# Patient Record
Sex: Female | Born: 1940 | Race: White | Hispanic: No | Marital: Married | Smoking: Former smoker
Health system: Southern US, Community
[De-identification: ages and names within clinical notes are randomized; demographics above are authoritative.]

## PROBLEM LIST (undated history)

## (undated) DIAGNOSIS — D649 Anemia, unspecified: Secondary | ICD-10-CM

## (undated) DIAGNOSIS — K219 Gastro-esophageal reflux disease without esophagitis: Secondary | ICD-10-CM

## (undated) DIAGNOSIS — R7989 Other specified abnormal findings of blood chemistry: Secondary | ICD-10-CM

## (undated) DIAGNOSIS — K5790 Diverticulosis of intestine, part unspecified, without perforation or abscess without bleeding: Secondary | ICD-10-CM

## (undated) DIAGNOSIS — R0600 Dyspnea, unspecified: Secondary | ICD-10-CM

## (undated) DIAGNOSIS — L309 Dermatitis, unspecified: Secondary | ICD-10-CM

## (undated) DIAGNOSIS — R918 Other nonspecific abnormal finding of lung field: Secondary | ICD-10-CM

## (undated) DIAGNOSIS — E781 Pure hyperglyceridemia: Secondary | ICD-10-CM

## (undated) DIAGNOSIS — C569 Malignant neoplasm of unspecified ovary: Secondary | ICD-10-CM

## (undated) DIAGNOSIS — H269 Unspecified cataract: Secondary | ICD-10-CM

## (undated) DIAGNOSIS — R0609 Other forms of dyspnea: Secondary | ICD-10-CM

## (undated) DIAGNOSIS — H9190 Unspecified hearing loss, unspecified ear: Secondary | ICD-10-CM

## (undated) DIAGNOSIS — M8580 Other specified disorders of bone density and structure, unspecified site: Secondary | ICD-10-CM

## (undated) DIAGNOSIS — C50919 Malignant neoplasm of unspecified site of unspecified female breast: Secondary | ICD-10-CM

## (undated) DIAGNOSIS — J439 Emphysema, unspecified: Secondary | ICD-10-CM

## (undated) DIAGNOSIS — L719 Rosacea, unspecified: Secondary | ICD-10-CM

## (undated) DIAGNOSIS — E041 Nontoxic single thyroid nodule: Secondary | ICD-10-CM

## (undated) DIAGNOSIS — F32A Depression, unspecified: Secondary | ICD-10-CM

## (undated) DIAGNOSIS — B029 Zoster without complications: Secondary | ICD-10-CM

## (undated) DIAGNOSIS — G43909 Migraine, unspecified, not intractable, without status migrainosus: Secondary | ICD-10-CM

## (undated) DIAGNOSIS — C801 Malignant (primary) neoplasm, unspecified: Secondary | ICD-10-CM

## (undated) DIAGNOSIS — T8859XA Other complications of anesthesia, initial encounter: Secondary | ICD-10-CM

## (undated) DIAGNOSIS — S12000A Unspecified displaced fracture of first cervical vertebra, initial encounter for closed fracture: Secondary | ICD-10-CM

## (undated) DIAGNOSIS — Z800 Family history of malignant neoplasm of digestive organs: Secondary | ICD-10-CM

## (undated) DIAGNOSIS — C3492 Malignant neoplasm of unspecified part of left bronchus or lung: Secondary | ICD-10-CM

## (undated) DIAGNOSIS — F329 Major depressive disorder, single episode, unspecified: Secondary | ICD-10-CM

## (undated) DIAGNOSIS — IMO0002 Reserved for concepts with insufficient information to code with codable children: Secondary | ICD-10-CM

## (undated) DIAGNOSIS — I209 Angina pectoris, unspecified: Secondary | ICD-10-CM

## (undated) DIAGNOSIS — D126 Benign neoplasm of colon, unspecified: Secondary | ICD-10-CM

## (undated) DIAGNOSIS — L4050 Arthropathic psoriasis, unspecified: Secondary | ICD-10-CM

## (undated) DIAGNOSIS — R519 Headache, unspecified: Secondary | ICD-10-CM

## (undated) DIAGNOSIS — K439 Ventral hernia 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 gangrene: Secondary | ICD-10-CM

## (undated) DIAGNOSIS — T7840XA Allergy, unspecified, initial encounter: Secondary | ICD-10-CM

## (undated) DIAGNOSIS — F419 Anxiety disorder, unspecified: Secondary | ICD-10-CM

## (undated) DIAGNOSIS — I10 Essential (primary) hypertension: Secondary | ICD-10-CM

## (undated) DIAGNOSIS — N189 Chronic kidney disease, unspecified: Secondary | ICD-10-CM

## (undated) DIAGNOSIS — J449 Chronic obstructive pulmonary disease, unspecified: Secondary | ICD-10-CM

## (undated) DIAGNOSIS — C4490 Unspecified malignant neoplasm of skin, unspecified: Secondary | ICD-10-CM

## (undated) DIAGNOSIS — I700 Atherosclerosis of aorta: Secondary | ICD-10-CM

## (undated) HISTORY — DX: Depression, unspecified: F32.A

## (undated) HISTORY — DX: Malignant (primary) neoplasm, unspecified: C80.1

## (undated) HISTORY — DX: Zoster without complications: B02.9

## (undated) HISTORY — DX: Anemia, unspecified: D64.9

## (undated) HISTORY — PX: COLONOSCOPY: SHX174

## (undated) HISTORY — DX: Other specified disorders of bone density and structure, unspecified site: M85.80

## (undated) HISTORY — DX: Essential (primary) hypertension: I10

## (undated) HISTORY — PX: CATARACT EXTRACTION W/ INTRAOCULAR LENS IMPLANT: SHX1309

## (undated) HISTORY — DX: Malignant neoplasm of unspecified ovary: C56.9

## (undated) HISTORY — DX: Emphysema, unspecified: J43.9

## (undated) HISTORY — DX: Unspecified malignant neoplasm of skin, unspecified: C44.90

## (undated) HISTORY — DX: Chronic obstructive pulmonary disease, unspecified: J44.9

## (undated) HISTORY — DX: Unspecified cataract: H26.9

## (undated) HISTORY — PX: BREAST SURGERY: SHX581

## (undated) HISTORY — DX: Allergy, unspecified, initial encounter: T78.40XA

## (undated) HISTORY — DX: Dermatitis, unspecified: L30.9

## (undated) HISTORY — DX: Rosacea, unspecified: L71.9

## (undated) HISTORY — DX: Major depressive disorder, single episode, unspecified: F32.9

## (undated) HISTORY — DX: Diverticulosis of intestine, part unspecified, without perforation or abscess without bleeding: K57.90

## (undated) HISTORY — PX: POLYPECTOMY: SHX149

## (undated) HISTORY — DX: Benign neoplasm of colon, unspecified: D12.6

## (undated) HISTORY — PX: OTHER SURGICAL HISTORY: SHX169

## (undated) HISTORY — PX: LIPOMA EXCISION: SHX5283

## (undated) HISTORY — DX: Family history of malignant neoplasm of digestive organs: Z80.0

## (undated) HISTORY — DX: Malignant neoplasm of unspecified site of unspecified female breast: C50.919

## (undated) HISTORY — DX: Reserved for concepts with insufficient information to code with codable children: IMO0002

## (undated) HISTORY — PX: BLADDER SUSPENSION: SHX72

## (undated) HISTORY — DX: Anxiety disorder, unspecified: F41.9

## (undated) HISTORY — PX: NASAL SEPTUM SURGERY: SHX37

## (undated) HISTORY — PX: EYE SURGERY: SHX253

---

## 1994-01-03 HISTORY — PX: ABDOMINAL HYSTERECTOMY: SHX81

## 1996-08-03 HISTORY — PX: CHOLECYSTECTOMY: SHX55

## 1997-05-14 ENCOUNTER — Other Ambulatory Visit: Admission: RE | Admit: 1997-05-14 | Discharge: 1997-05-14 | Payer: Self-pay | Admitting: Obstetrics and Gynecology

## 2000-08-08 ENCOUNTER — Encounter: Payer: Self-pay | Admitting: Family Medicine

## 2000-08-08 ENCOUNTER — Encounter: Admission: RE | Admit: 2000-08-08 | Discharge: 2000-08-08 | Payer: Self-pay | Admitting: Family Medicine

## 2003-10-16 ENCOUNTER — Encounter: Admission: RE | Admit: 2003-10-16 | Discharge: 2003-10-16 | Payer: Self-pay | Admitting: Family Medicine

## 2005-08-29 ENCOUNTER — Ambulatory Visit: Payer: Self-pay | Admitting: Family Medicine

## 2005-09-01 ENCOUNTER — Ambulatory Visit: Payer: Self-pay

## 2005-09-09 ENCOUNTER — Ambulatory Visit: Payer: Self-pay | Admitting: Family Medicine

## 2005-09-19 ENCOUNTER — Ambulatory Visit: Payer: Self-pay | Admitting: Family Medicine

## 2005-10-19 ENCOUNTER — Ambulatory Visit: Payer: Self-pay | Admitting: Family Medicine

## 2005-10-27 ENCOUNTER — Encounter: Admission: RE | Admit: 2005-10-27 | Discharge: 2005-10-27 | Payer: Self-pay | Admitting: General Surgery

## 2005-11-03 HISTORY — PX: OTHER SURGICAL HISTORY: SHX169

## 2006-11-08 ENCOUNTER — Telehealth: Payer: Self-pay | Admitting: Family Medicine

## 2007-01-11 ENCOUNTER — Encounter (INDEPENDENT_AMBULATORY_CARE_PROVIDER_SITE_OTHER): Payer: Self-pay | Admitting: *Deleted

## 2007-01-29 DIAGNOSIS — L2089 Other atopic dermatitis: Secondary | ICD-10-CM

## 2007-01-29 DIAGNOSIS — L719 Rosacea, unspecified: Secondary | ICD-10-CM

## 2007-01-29 DIAGNOSIS — Z85828 Personal history of other malignant neoplasm of skin: Secondary | ICD-10-CM

## 2007-02-04 DIAGNOSIS — B029 Zoster without complications: Secondary | ICD-10-CM

## 2007-02-04 HISTORY — DX: Zoster without complications: B02.9

## 2007-02-05 ENCOUNTER — Ambulatory Visit: Payer: Self-pay | Admitting: Family Medicine

## 2007-02-05 DIAGNOSIS — M810 Age-related osteoporosis without current pathological fracture: Secondary | ICD-10-CM | POA: Insufficient documentation

## 2007-02-05 DIAGNOSIS — M8580 Other specified disorders of bone density and structure, unspecified site: Secondary | ICD-10-CM | POA: Insufficient documentation

## 2007-02-05 DIAGNOSIS — I10 Essential (primary) hypertension: Secondary | ICD-10-CM | POA: Insufficient documentation

## 2007-02-05 DIAGNOSIS — F329 Major depressive disorder, single episode, unspecified: Secondary | ICD-10-CM

## 2007-02-06 LAB — CONVERTED CEMR LAB
Albumin: 3.5 g/dL (ref 3.5–5.2)
Basophils Relative: 0.2 % (ref 0.0–1.0)
CO2: 31 meq/L (ref 19–32)
Creatinine, Ser: 1.1 mg/dL (ref 0.4–1.2)
Eosinophils Relative: 2.1 % (ref 0.0–5.0)
Glucose, Bld: 91 mg/dL (ref 70–99)
HDL: 34.6 mg/dL — ABNORMAL LOW (ref >39.0)
Hemoglobin: 12.3 g/dL (ref 12.0–15.0)
Lymphocytes Relative: 19.9 % (ref 12.0–46.0)
Monocytes Absolute: 0.8 10*3/uL — ABNORMAL HIGH (ref 0.2–0.7)
Monocytes Relative: 9.7 % (ref 3.0–11.0)
Neutro Abs: 5.6 10*3/uL (ref 1.4–7.7)
Phosphorus: 4.6 mg/dL (ref 2.3–4.6)
Sodium: 141 meq/L (ref 135–145)
VLDL: 33 mg/dL (ref 0–40)

## 2007-02-15 ENCOUNTER — Encounter: Admission: RE | Admit: 2007-02-15 | Discharge: 2007-02-15 | Payer: Self-pay | Admitting: Family Medicine

## 2007-02-15 ENCOUNTER — Ambulatory Visit: Payer: Self-pay | Admitting: Family Medicine

## 2007-02-15 DIAGNOSIS — E079 Disorder of thyroid, unspecified: Secondary | ICD-10-CM | POA: Insufficient documentation

## 2007-02-16 LAB — CONVERTED CEMR LAB
Free T4: 0.6 ng/dL (ref 0.6–1.6)
T3 Uptake Ratio: 41.3 % — ABNORMAL HIGH (ref 22.5–37.0)
T4, Total: 5.7 ug/dL (ref 5.0–12.5)
TSH: 2.19 microintl units/mL (ref 0.35–5.50)

## 2007-02-22 ENCOUNTER — Ambulatory Visit: Payer: Self-pay | Admitting: Family Medicine

## 2007-02-23 ENCOUNTER — Ambulatory Visit: Payer: Self-pay | Admitting: Family Medicine

## 2007-02-23 DIAGNOSIS — Z8619 Personal history of other infectious and parasitic diseases: Secondary | ICD-10-CM | POA: Insufficient documentation

## 2007-03-02 ENCOUNTER — Telehealth: Payer: Self-pay | Admitting: Family Medicine

## 2007-03-06 ENCOUNTER — Ambulatory Visit: Payer: Self-pay | Admitting: Family Medicine

## 2007-03-15 ENCOUNTER — Encounter: Admission: RE | Admit: 2007-03-15 | Discharge: 2007-03-15 | Payer: Self-pay | Admitting: Family Medicine

## 2007-03-15 ENCOUNTER — Ambulatory Visit: Payer: Self-pay | Admitting: Family Medicine

## 2007-03-15 DIAGNOSIS — IMO0002 Reserved for concepts with insufficient information to code with codable children: Secondary | ICD-10-CM

## 2007-03-21 ENCOUNTER — Encounter: Admission: RE | Admit: 2007-03-21 | Discharge: 2007-03-21 | Payer: Self-pay | Admitting: Family Medicine

## 2007-03-26 ENCOUNTER — Telehealth: Payer: Self-pay | Admitting: Family Medicine

## 2007-03-30 ENCOUNTER — Encounter: Admission: RE | Admit: 2007-03-30 | Discharge: 2007-03-30 | Payer: Self-pay | Admitting: Neurosurgery

## 2007-04-03 ENCOUNTER — Encounter: Payer: Self-pay | Admitting: Family Medicine

## 2007-04-11 ENCOUNTER — Encounter: Payer: Self-pay | Admitting: Family Medicine

## 2007-04-14 ENCOUNTER — Encounter: Payer: Self-pay | Admitting: Family Medicine

## 2007-06-11 ENCOUNTER — Telehealth: Payer: Self-pay | Admitting: Family Medicine

## 2008-01-25 ENCOUNTER — Ambulatory Visit: Payer: Self-pay | Admitting: Family Medicine

## 2008-01-29 ENCOUNTER — Encounter: Payer: Self-pay | Admitting: Family Medicine

## 2008-02-04 DIAGNOSIS — C801 Malignant (primary) neoplasm, unspecified: Secondary | ICD-10-CM

## 2008-02-04 HISTORY — DX: Malignant (primary) neoplasm, unspecified: C80.1

## 2008-02-08 ENCOUNTER — Encounter: Payer: Self-pay | Admitting: Family Medicine

## 2008-02-11 ENCOUNTER — Telehealth (INDEPENDENT_AMBULATORY_CARE_PROVIDER_SITE_OTHER): Payer: Self-pay | Admitting: *Deleted

## 2008-02-13 ENCOUNTER — Encounter: Admission: RE | Admit: 2008-02-13 | Discharge: 2008-02-13 | Payer: Self-pay | Admitting: Radiology

## 2008-02-19 ENCOUNTER — Encounter: Payer: Self-pay | Admitting: Family Medicine

## 2008-03-19 ENCOUNTER — Ambulatory Visit (HOSPITAL_COMMUNITY): Admission: RE | Admit: 2008-03-19 | Discharge: 2008-03-19 | Payer: Self-pay | Admitting: General Surgery

## 2008-03-19 ENCOUNTER — Inpatient Hospital Stay (HOSPITAL_COMMUNITY): Admission: RE | Admit: 2008-03-19 | Discharge: 2008-03-22 | Payer: Self-pay | Admitting: General Surgery

## 2008-03-19 ENCOUNTER — Encounter (INDEPENDENT_AMBULATORY_CARE_PROVIDER_SITE_OTHER): Payer: Self-pay | Admitting: General Surgery

## 2008-03-19 ENCOUNTER — Ambulatory Visit: Payer: Self-pay | Admitting: Oncology

## 2008-04-16 ENCOUNTER — Encounter: Payer: Self-pay | Admitting: Family Medicine

## 2008-04-16 LAB — LACTATE DEHYDROGENASE: LDH: 165 U/L (ref 94–250)

## 2008-04-16 LAB — CBC WITH DIFFERENTIAL/PLATELET
BASO%: 0.4 % (ref 0.0–2.0)
EOS%: 2.7 % (ref 0.0–7.0)
LYMPH%: 33 % (ref 14.0–49.7)
MCH: 27.9 pg (ref 25.1–34.0)
MCHC: 33.8 g/dL (ref 31.5–36.0)
MONO#: 0.7 10*3/uL (ref 0.1–0.9)
Platelets: 264 10*3/uL (ref 145–400)
RBC: 4.01 10*6/uL (ref 3.70–5.45)
WBC: 7.8 10*3/uL (ref 3.9–10.3)

## 2008-05-02 ENCOUNTER — Ambulatory Visit: Payer: Self-pay | Admitting: Genetic Counselor

## 2008-05-12 ENCOUNTER — Encounter (INDEPENDENT_AMBULATORY_CARE_PROVIDER_SITE_OTHER): Payer: Self-pay | Admitting: Plastic Surgery

## 2008-05-12 ENCOUNTER — Ambulatory Visit (HOSPITAL_BASED_OUTPATIENT_CLINIC_OR_DEPARTMENT_OTHER): Admission: RE | Admit: 2008-05-12 | Discharge: 2008-05-12 | Payer: Self-pay | Admitting: Plastic Surgery

## 2008-06-23 ENCOUNTER — Telehealth: Payer: Self-pay | Admitting: Family Medicine

## 2008-06-27 ENCOUNTER — Telehealth (INDEPENDENT_AMBULATORY_CARE_PROVIDER_SITE_OTHER): Payer: Self-pay | Admitting: *Deleted

## 2008-06-27 ENCOUNTER — Telehealth: Payer: Self-pay | Admitting: Internal Medicine

## 2008-07-17 ENCOUNTER — Ambulatory Visit: Payer: Self-pay | Admitting: Family Medicine

## 2008-07-18 LAB — CONVERTED CEMR LAB
AST: 25 units/L (ref 0–37)
Albumin: 3.9 g/dL (ref 3.5–5.2)
Basophils Absolute: 0 10*3/uL (ref 0.0–0.1)
Basophils Relative: 0.6 % (ref 0.0–3.0)
Calcium: 9.1 mg/dL (ref 8.4–10.5)
Chloride: 104 meq/L (ref 96–112)
Cholesterol: 188 mg/dL (ref 0–200)
HCT: 33.5 % — ABNORMAL LOW (ref 36.0–46.0)
Hemoglobin: 11.5 g/dL — ABNORMAL LOW (ref 12.0–15.0)
Lymphs Abs: 2.4 10*3/uL (ref 0.7–4.0)
MCHC: 34.3 g/dL (ref 30.0–36.0)
Monocytes Relative: 8.5 % (ref 3.0–12.0)
Neutro Abs: 3.7 10*3/uL (ref 1.4–7.7)
Phosphorus: 4.5 mg/dL (ref 2.3–4.6)
Potassium: 4.5 meq/L (ref 3.5–5.1)
RBC: 4.09 M/uL (ref 3.87–5.11)
RDW: 13.1 % (ref 11.5–14.6)
TSH: 1.61 microintl units/mL (ref 0.35–5.50)
Total Protein: 7.3 g/dL (ref 6.0–8.3)

## 2008-08-06 ENCOUNTER — Ambulatory Visit: Payer: Self-pay | Admitting: Gastroenterology

## 2008-08-14 ENCOUNTER — Encounter: Payer: Self-pay | Admitting: Gastroenterology

## 2008-08-14 ENCOUNTER — Ambulatory Visit: Payer: Self-pay | Admitting: Gastroenterology

## 2008-08-18 ENCOUNTER — Encounter: Payer: Self-pay | Admitting: Gastroenterology

## 2008-09-22 ENCOUNTER — Telehealth: Payer: Self-pay | Admitting: Family Medicine

## 2008-10-07 ENCOUNTER — Encounter: Payer: Self-pay | Admitting: Family Medicine

## 2008-10-14 ENCOUNTER — Ambulatory Visit: Payer: Self-pay | Admitting: Genetic Counselor

## 2008-10-16 ENCOUNTER — Encounter: Payer: Self-pay | Admitting: Family Medicine

## 2009-01-29 ENCOUNTER — Encounter: Payer: Self-pay | Admitting: Family Medicine

## 2009-02-06 ENCOUNTER — Encounter (INDEPENDENT_AMBULATORY_CARE_PROVIDER_SITE_OTHER): Payer: Self-pay | Admitting: *Deleted

## 2009-02-17 ENCOUNTER — Encounter: Payer: Self-pay | Admitting: Family Medicine

## 2009-05-25 ENCOUNTER — Ambulatory Visit: Payer: Self-pay | Admitting: Family Medicine

## 2009-05-25 DIAGNOSIS — M79609 Pain in unspecified limb: Secondary | ICD-10-CM

## 2009-06-16 ENCOUNTER — Telehealth: Payer: Self-pay | Admitting: Family Medicine

## 2009-08-05 ENCOUNTER — Encounter: Payer: Self-pay | Admitting: Gastroenterology

## 2010-02-02 NOTE — Letter (Signed)
Summary: Colonoscopy Letter  [facility]  [address] [place], [place] [address]   Phone: [phone]  Fax: [phone]      August 05, 2009 MRN: [id number]   [facility] [address] [place], [place]  [address]   Dear Ms. Wertheim,   According to your medical record, it is time for you to schedule a Colonoscopy. The American Cancer Society recommends this procedure as a method to detect early colon cancer. Patients with a family history of colon cancer, or a personal history of colon polyps or inflammatory bowel disease are at increased risk.  This letter has been generated based on the recommendations made at the time of your procedure. If you feel that in your particular situation this may no longer apply, please contact our office.  Please call our office at [phone] to schedule this appointment or to update your records at your earliest convenience.  Thank you for cooperating with [place] to provide you with the very best care possible.   Sincerely,   Barbette Hair. Arlyce Dice, M.D.  [facility] [phone]

## 2010-02-02 NOTE — Letter (Signed)
Summary: [facility]   Imported By: Lanelle Bal 03/02/2009 13:14:25  _____________________________________________________________________  External Attachment:    Type:   Image     Comment:   External Document

## 2010-02-02 NOTE — Progress Notes (Signed)
Summary: requests motion sickness patches  Phone Note Call from Patient Call back at Home Phone [phone]   Caller: Patient Call For: Judith Part MD Summary of Call: Pt is going on a 5 day cruise and is asking if she can have motion sickness patches.  She will also be on buses and trains for 12 days and she says those things make her sick also.  [facility]. Initial call taken by: Lowella Petties CMA,  June 16, 2009 9:43 AM  Follow-up for Phone Call        px written on EMR for call in warn of sedation with this med - scopalamine  Follow-up by: Judith Part MD,  June 16, 2009 10:42 AM  Additional Follow-up for Phone Call Additional follow up Details #1::        Patient Advised.  Additional Follow-up by: Delilah Shan CMA (AAMA),  June 16, 2009 10:48 AM    New/Updated Medications: TRANSDERM-SCOP 1.5 MG PT72 (SCOPOLAMINE BASE) apply one patch every 3 days as directed as needed motion sickness Prescriptions: TRANSDERM-SCOP 1.5 MG PT72 (SCOPOLAMINE BASE) apply one patch every 3 days as directed as needed motion sickness  #10 x 0   Entered by:   Delilah Shan CMA (AAMA)   Authorized by:   Judith Part MD   Signed by:   Delilah Shan CMA (AAMA) on 06/16/2009   Method used:   Electronically to        [facility]* (retail)       [address]       [place], [place]  [address]       Ph: [phone]       Fax: [phone]   RxID:   [id number]

## 2010-02-02 NOTE — Assessment & Plan Note (Signed)
Summary: PAIN IN TOES ON BOTH FEET/CLE   Vital Signs:  Patient profile:   70 year old female Height:      60 inches Weight:      132.25 pounds BMI:     25.92 Temp:     97.8 degrees F oral Pulse rate:   64 / minute Pulse rhythm:   regular BP sitting:   138 / 76  (left arm) Cuff size:   regular  Vitals Entered By: Lewanda Rife LPN (2009-06-16 12:07 PM) CC: pain in rt big toe and lt 4th toe   History of Present Illness: has pain in both big toes  a little swelling in R toe --otherwise none  they get a little red - only if rubbing on shoe   at night throbs  ? arthritis  no ingrown nails   did try to change shoes and socks  does exercise regularly - 6-7 days per week - elliptical / stair/ treadmill/ bike    ? 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 thoracic aneurysm Siblings: 1 brother died of HTN, MI.  1 sister died of MI age 17. 4 living siblings sister died CAD/ DM / lung problems/ obese   Social History: Last  updated: 01/25/2008 Marital Status: Married Children: son and grand son killed in train accident Occupation: retired quit smoking 20 y ago, 25 pack year history  Risk Factors: Smoking Status: quit (01/29/2007)  Family History: Father: died age 22- lung cancer Mother: - HTN, CAD, ? thoracic aneurysm Siblings: 1 brother died of HTN, MI.  1 sister died of MI age 17. 4 living siblings sister died CAD/ DM / lung problems/ obese   Review of Systems General:  Complains of fatigue; denies fever, loss of appetite, and malaise. Eyes:  Denies blurring. CV:  Denies chest pain or discomfort, palpitations, shortness of breath with exertion, and swelling of feet. Resp:  Denies cough and wheezing. GI:  Denies abdominal pain, bloody stools, change in bowel habits, and indigestion. GU:  Denies dysuria and urinary frequency. MS:  Complains of joint pain and stiffness; denies joint redness, joint swelling, muscle, and muscle weakness. Derm:  Denies itching, lesion(s), poor wound healing, and rash. Neuro:  Denies numbness, tingling, and weakness. Psych:  is stressed / grieving but doing ok . Endo:  Denies cold intolerance, excessive thirst, excessive urination, and heat intolerance.  Physical Exam  General:  Well-developed,well-nourished,in no acute distress; alert,appropriate and cooperative throughout examination Head:  normocephalic, atraumatic, and no abnormalities observed.   Eyes:  vision grossly intact, pupils equal, pupils  round, and pupils reactive to light.  no conjunctival pallor, injection or icterus  Mouth:  pharynx pink and moist.   Neck:  supple with full rom and no masses or thyromegally, no JVD or carotid bruit  Lungs:  Normal respiratory effort, chest expands symmetrically. Lungs are clear to auscultation, no crackles or wheezes. Heart:  Normal rate and regular rhythm. S1 and S2 normal without gallop, murmur, click, rub or other extra sounds. Msk:  R foot - distal dip joint is slt  swollen / injected but not red/ no crepitice but slt tender  nl rom rest of foot  L foot -tender under base of 3rd and 4th metatarsals - without swelling bunions not observed toenails healthy appearing  some changes of OA diffusely Pulses:  R and L carotid,radial,femoral,dorsalis pedis and posterior tibial pulses are full and equal bilaterally Extremities:  No clubbing, cyanosis, edema, or deformity noted with normal full range of motion of all joints.   Neurologic:  sensation intact to light touch, gait normal, and DTRs symmetrical and normal.   Skin:  Intact without suspicious lesions or rashes Cervical Nodes:  No lymphadenopathy noted Inguinal Nodes:  No significant adenopathy Psych:  normal affect, talkative and pleasant  seems sad when discussing loss - but not tearful    Impression & Recommendations:  Problem # 1:  FOOT PAIN, BILATERAL (ICD-729.5) Assessment New R foot - swelling and tenderness of distal joint -- ? OA vs atypical gout L foot - pain betweens 3-4th toes- ? poss neuroma pt very active - but not a lot of imp with 2 week break from exercise  will try mobic 15 mg daily for 2 weeks ref to podiatry for films and further eval  Orders: Podiatry Referral Personal assistant) Prescription Created Electronically [phone])  Problem # 2:  DEPRESSION (ICD-311) Assessment: Deteriorated  this worsened with breast ca and loss of sister she is imp with inc prozac from her gyn, however  offered counseling ref at any time if she needs it  Her updated medication list for this problem includes:    Prozac 20 Mg Caps (Fluoxetine hcl) .Marland Kitchen... 1 by mouth once daily  Orders: Prescription Created Electronically [id number])  Complete Medication List: 1)  Prozac 20 Mg Caps (Fluoxetine hcl) .Marland Kitchen.. 1 by mouth once daily 2)  Norvasc 10 Mg Tabs (Amlodipine besylate) .... Take one by mouth daily 3)  Actonel 35 Mg Tabs (Risedronate sodium) .... Take one by mouth once a week 4)  Premarin Vaginal Cream  ....  Use 3 times every week 5)  Percocet 7.5-325 Mg Tabs (Oxycodone-acetaminophen) .Marland Kitchen.. 1 by mouth q 6 hours as needed pain 6)  Multivitamins Tabs (Multiple vitamin) .... Take 1 tablet by mouth once a day 7)  Mobic 15 Mg Tabs (Meloxicam) .Marland Kitchen.. 1 by mouth once daily with food for 2 weeks  Patient Instructions: 1)  take the mobic for foot pain for 14 days- stop it if you get stomach upset  2)  we will refer you to podiatry at check out  3)  continue supportive shoes  Prescriptions: MOBIC 15 MG TABS (MELOXICAM) 1 by mouth once daily with food for 2 weeks  #14 x 0   Entered and Authorized by:   Judith Part MD   Signed by:   Judith Part MD on 05/25/2009   Method used:   Electronically to        [facility]* (retail)       [address]  [place], [place]  [address]       Ph: [phone]       Fax: [phone]   RxID:   [id number]   Current Allergies (reviewed today): No known allergies

## 2010-02-02 NOTE — Miscellaneous (Signed)
Summary: mammogram results   Clinical Lists Changes  Observations: Added new observation of MAMMO DUE: 02/2010 (02/06/2009 8:58) Added new observation of MAMMOGRAM: normal (02/06/2009 8:58)      Preventive Care Screening  Mammogram:    Date:  02/06/2009    Next Due:  02/2010    Results:  normal

## 2010-04-12 ENCOUNTER — Other Ambulatory Visit: Payer: Self-pay | Admitting: *Deleted

## 2010-04-12 MED ORDER — AMLODIPINE BESYLATE 10 MG PO TABS: 10.0000 mg | ORAL_TABLET | Freq: Every day | ORAL | Status: DC

## 2010-04-13 LAB — BASIC METABOLIC PANEL
BUN: 24 mg/dL — ABNORMAL HIGH (ref 6–23)
Calcium: 9 mg/dL (ref 8.4–10.5)
Chloride: 101 mEq/L (ref 96–112)
Creatinine, Ser: 1.16 mg/dL (ref 0.4–1.2)
GFR calc Af Amer: 56 mL/min — ABNORMAL LOW (ref >60)
GFR calc non Af Amer: 46 mL/min — ABNORMAL LOW (ref >60)

## 2010-04-13 LAB — POCT HEMOGLOBIN-HEMACUE: Hemoglobin: 12.5 g/dL (ref 12.0–15.0)

## 2010-04-14 LAB — HM MAMMOGRAPHY: HM Mammogram: NEGATIVE

## 2010-04-15 LAB — DIFFERENTIAL
Lymphs Abs: 2 10*3/uL (ref 0.7–4.0)
Monocytes Absolute: 0.4 10*3/uL (ref 0.1–1.0)
Monocytes Relative: 5 % (ref 3–12)
Neutro Abs: 4.2 10*3/uL (ref 1.7–7.7)
Neutrophils Relative %: 63 % (ref 43–77)

## 2010-04-15 LAB — CBC
HCT: 29.3 % — ABNORMAL LOW (ref 36.0–46.0)
Hemoglobin: 10.2 g/dL — ABNORMAL LOW (ref 12.0–15.0)
Hemoglobin: 10.9 g/dL — ABNORMAL LOW (ref 12.0–15.0)
MCHC: 33.6 g/dL (ref 30.0–36.0)
MCV: 83.5 fL (ref 78.0–100.0)
Platelets: 208 10*3/uL (ref 150–400)
Platelets: 240 10*3/uL (ref 150–400)
RDW: 14.4 % (ref 11.5–15.5)
RDW: 14.6 % (ref 11.5–15.5)

## 2010-04-15 LAB — BASIC METABOLIC PANEL
BUN: 16 mg/dL (ref 6–23)
CO2: 28 mEq/L (ref 19–32)
Chloride: 104 mEq/L (ref 96–112)
Glucose, Bld: 118 mg/dL — ABNORMAL HIGH (ref 70–99)
Potassium: 4 mEq/L (ref 3.5–5.1)
Sodium: 137 mEq/L (ref 135–145)

## 2010-04-15 LAB — URINALYSIS, ROUTINE W REFLEX MICROSCOPIC
Glucose, UA: NEGATIVE mg/dL
Hgb urine dipstick: NEGATIVE
Specific Gravity, Urine: 1.023 (ref 1.005–1.030)
pH: 6 (ref 5.0–8.0)

## 2010-04-15 LAB — COMPREHENSIVE METABOLIC PANEL
Albumin: 3.6 g/dL (ref 3.5–5.2)
Alkaline Phosphatase: 48 U/L (ref 39–117)
BUN: 23 mg/dL (ref 6–23)
Calcium: 8.9 mg/dL (ref 8.4–10.5)
Glucose, Bld: 90 mg/dL (ref 70–99)
Potassium: 3.9 mEq/L (ref 3.5–5.1)
Sodium: 138 mEq/L (ref 135–145)
Total Protein: 6.6 g/dL (ref 6.0–8.3)

## 2010-04-15 LAB — TYPE AND SCREEN
ABO/RH(D): O NEG
DAT, IgG: NEGATIVE

## 2010-04-21 ENCOUNTER — Telehealth: Payer: Self-pay

## 2010-04-21 NOTE — Telephone Encounter (Signed)
Patient notified as instructed by mail that mammogram was negative. Repeat in one year. Noted on health maintenance.Sent to be scanned.

## 2010-04-22 ENCOUNTER — Encounter: Payer: Self-pay | Admitting: Family Medicine

## 2010-05-14 ENCOUNTER — Other Ambulatory Visit: Payer: Self-pay | Admitting: *Deleted

## 2010-05-14 MED ORDER — AMLODIPINE BESYLATE 10 MG PO TABS: 10.0000 mg | ORAL_TABLET | Freq: Every day | ORAL | Status: DC

## 2010-05-18 NOTE — Op Note (Signed)
NAMEETHAN, Pamela Morales                 ACCOUNT NO.:  [id number]   MEDICAL RECORD NO.:  [id number]          PATIENT TYPE:  INP   LOCATION:  5156                         FACILITY:  [facility]   PHYSICIAN:  Angelia Mould. Derrell Lolling, M.D.DATE OF BIRTH:  Jan 09, 1940   DATE OF PROCEDURE:  03/19/2008  DATE OF DISCHARGE:                               OPERATIVE REPORT   PREOPERATIVE DIAGNOSIS:  High-grade ductal carcinoma in situ, right  breast.   POSTOPERATIVE DIAGNOSIS:  High-grade ductal carcinoma in situ, right  breast.   OPERATIONS PERFORMED:  1. Inject blue dye into right breast.  2. Right total mastectomy.  3. Right axillary sentinel lymph node biopsy.   SURGEON:  Angelia Mould. Derrell Lolling, MD   FIRST ASSISTANT:  Anselm Pancoast. Zachery Dakins, MD   OPERATIVE INDICATIONS:  This is a 70 year old white female in very good  health.  She was found to have some microcalcifications in the right  breast and had mammograms, ultrasound, and BSGI and MRI.  What was found  was about a 9-mm mass by ultrasound, but a 4.9 cm area by BSGI in the  subareolar and upper outer quadrant of the right breast.  The image-  guided biopsy revealed high-grade ductal carcinoma in situ with  calcifications.  Her MRI showed a 3.9-cm area of enhancement within the  subareolar and upper outer quadrant of the right breast.  I evaluated  her as an outpatient.  She was referred to Dr. Noelle Penner because of her  interest in mastectomy and immediate reconstruction.  He felt that she  would be a good candidate for immediate reconstruction and surgery was  discussed.  She was counseled and she is brought to operating room  electively for right total mastectomy, right sentinel node biopsy,  possible right axillary node dissection, and immediate reconstruction.   OPERATIVE TECHNIQUE:  Following the induction of general endotracheal  anesthesia, surgical time-out was held identifying correct patient,  correct procedure, and correct site.  Following an  alcohol prep, I  injected 5 mL of blue dye in the right subareolar area.  This was 2 mL  of methylene blue mixed with 3 mL of saline.  The breast was massaged  for 5 minutes.  She had had radionuclide injected in the holding area.   We then prepped the neck, chest, and abdomen in a sterile fashion in  preparation for the TRAM reconstruction.   Using marking pen, I marked a transverse elliptical incision and  extending the incision superiorly more than inferiorly to encompass the  nipple and areolar complex completely.  Transverse incision was made.  Skin flaps were raised superiorly to just below the clavicle, medially  to the parasternal area, inferiorly to the upper part of the anterior  rectus sheath and laterally to the anterior border of latissimus dorsi  muscle.  A couple of bleeders were tied off with 3-0 Vicryl ties, but  mostly bleeding was taken care of with electrocautery.  Retractors were  positioned and using the NeoProbe, I isolated 2 very hot, very blue  sentinel nodes in the level I of the axilla.  These  were normal in size.  Both of these nodes were sent to the lab.  Dr. Colonel Bald performed immediate  H and E frozen section and said this was negative for cancer cells.  No  further axillary dissection was done.  Breast and tail of Spence of the  breast were dissected off of the pectoralis major and pectoralis minor  muscles and serratus muscles using electrocautery.  The upper outer  quadrant of the right breast was then marked with a suture and the  specimen sent for routine histology.  The wound was copiously irrigated  with saline.  Hemostasis was excellent and had been achieved with Vicryl  ties and electrocautery.   At this point in the case, the control of the operation was turned over  to Dr. Loreta Ave who will dictate his reconstructive procedure  separately.  Up to this point, estimated blood loss estimated about 100  mL.  Complications none.  Sponge,  needle, and instrument counts were  correct.      Angelia Mould. Derrell Lolling, M.D.  Electronically Signed     HMI/MEDQ  D:  03/19/2008  T:  03/19/2008  Job:  [id number]   cc:   Marne A. Milinda Antis, MD  Valentino Hue. Magrinat, M.D.  Loreta Ave, MD

## 2010-05-18 NOTE — Discharge Summary (Signed)
[name], [name]                 ACCOUNT NO.:  [id number]   MEDICAL RECORD NO.:  [id number]          PATIENT TYPE:  INP   LOCATION:  5156                         FACILITY:  [facility]   PHYSICIAN:  Loreta Ave, MD DATE OF BIRTH:  June 27, 1940   DATE OF ADMISSION:  03/19/2008  DATE OF DISCHARGE:  03/22/2008                               DISCHARGE SUMMARY   ADMISSION DIAGNOSIS:  Right breast cancer.   DISCHARGE DIAGNOSIS:  Right breast cancer.   PROCEDURES PERFORMED:  On March 19, 2008, Carolan Clines underwent a right  mastectomy and sentinel lymph node biopsy by Dr. Claud Kelp.  Dr.  Noelle Penner performed the right pedicle TRAM flap breast reconstruction.   DESCRIPTION OF CLINICAL COURSE:  On March 19, 2008, Pamela Morales was  admitted to [facility] and underwent a right mastectomy with  sentinel lymph node biopsy and right pedicle TRAM flap reconstruction.  She tolerated the procedure well, was extubated and transferred to the  floor in stable condition.  She was initially maintained on IV opiates  for analgesia and these were transitioned to oral medicines as she  tolerated a regular diet.  Her Foley was removed postoperatively and she  is voiding spontaneously.  She has had a bowel movement since surgery.  She is ambulating independently.  She has experienced no morbidity  postoperatively.   DIRECTIONS AND INSTRUCTIONS:  Jailey is being given a prescription for  oxycodone 5-10 mg p.o. q.4 h. p.r.n. for pain and prescription for  Colace 100 mg p.o. twice a day while taking oxycodone.  She has been  told to measure and record her drain output daily and she is proficient  in this.  She may shower, but not tub bathe.  She is not to lift  anything over 7 pounds.  She is not to drive a car or return to sexual  activity.  She may ambulate independently.  She is being given  instructions to call Dr. Derrell Lolling to schedule a followup appointment in  approximately 2 weeks' time.  She is  to call Dr. Noelle Penner at [phone] and  to schedule a followup appointment for this coming Thursday.  She is  also being given instructions to call Dr. Noelle Penner if she experiences  nausea, vomiting, fevers, chills, pain not relieved by pain medicine,  any questions or concerns.   CONDITION ON DISCHARGE:  Good.      Loreta Ave, MD  Electronically Signed     CF/MEDQ  D:  03/22/2008  T:  03/22/2008  Job:  [id number]

## 2010-05-18 NOTE — Op Note (Signed)
[name], [name]                 ACCOUNT NO.:  [id number]   MEDICAL RECORD NO.:  [id number]          PATIENT TYPE:  AMB   LOCATION:  DSC                          FACILITY:  [facility]   PHYSICIAN:  Loreta Ave, MD DATE OF BIRTH:  [birth year]   DATE OF PROCEDURE:  05/12/2008  DATE OF DISCHARGE:  05/12/2008                               OPERATIVE REPORT   PREOPERATIVE DIAGNOSIS:  Right breast cancer, status post right  mastectomy and right pedicle transverse rectus abdominis  musculocutaneous flap with postoperative fat necrosis.   POSTOPERATIVE DIAGNOSIS:  Right breast cancer, status post right  mastectomy and right pedicle transverse rectus abdominis  musculocutaneous flap with postoperative fat necrosis.   SURGEON:  Loreta Ave, MD   PROCEDURE:  Revision of reconstructed breast.   ESTIMATED BLOOD LOSS:  10 mL.   INTRAVENOUS FLUIDS:  Crystalloid 1 L.   URINE OUTPUT:  Not recorded.   DRAINS:  None.   COMPLICATIONS:  None.   CLINICAL INDICATION:  Pamela Morales is a 70 year old Caucasian female who  is status post right mastectomy and right pedicle TRAM flap for  immediate breast reconstruction.  Postoperatively, she developed an area  of firmness that was approximately 15 x 3 cm along the medial edge of  her reconstructed breast from the 2 o'clock position to the 9 o'clock  position along the periphery of her pedicle flap.  She has a small  draining wound on the medial aspect as well.  She presents at this time  having achieved a stable amount of fat necrosis over the last few  months.  She would like to have this area excised.  She also desires a  slightly smaller volume to her right, reconstructed breast.   After discussion of the risks of surgery, which include, but are not  limited bleeding, infection, damage to the nearby structures including  the pedicle of her flap and subsequent flap loss, scarring, recurrent  fat necrosis, and the need for more surgery.   Lizabeth understands these  risks and desires to proceed.   DESCRIPTION OF OPERATION:  The patient was brought to the operating room  and placed in the supine position on the operating room table.  After a  smooth and routine induction of general anesthesia with LMA, the  patient's chest was prepped with ChloraPrep and draped into a sterile  field.  The area of firmness had been preoperatively marked and measured  approximately 18 x 3 cm stretching in a crescent along the circumference  of the right breast from the 2 o'clock position to the 9 o'clock  position.  This was injected with 20 mL of 0.5% Marcaine with 1:100,000  epinephrine.  The skin was incised with a 10 blade along the premarked  crescent and dissection proceeded with electrocautery.  Initially,  dissection was carried down on the lateral incision, which was on the  reconstructed breast.  Care was taken to excise all necrotic fat and all  palpable nodules, this proceeded down to the chest wall along the medial  aspect of the breast.  Next, dissection was then proceeded  along the  medial incision.  Care was taken around the area of the pedicle not to  injure the muscle or its associated blood supply to the flap.  Now the  crescent of tissue was excised with electrocautery and passed off the  field labeled right breast tissue and sent to pathology.  Next, the  wound was irrigated copiously with normal saline and hemostasis assured  with electrocautery.  The deep layer was closed with 3-0 interrupted  Vicryl sutures to obliterate dead space and to medialize the  reconstructed breast.  Skin was closed with interrupted buried 3-0  Monocryl sutures and a running 4-0 subcuticular stitch.  Dermabond and  Steri-Strips were applied.  Throughout the entire operation, the  patient's flap was warm and pink with excellent capillary refill and had  bleeding at the dermal edges at the end.  The patient was then extubated  and transferred to  the recovery room in stable condition.      Loreta Ave, MD  Electronically Signed     CF/MEDQ  D:  05/12/2008  T:  05/13/2008  Job:  [id number]

## 2010-05-18 NOTE — Op Note (Signed)
Pamela Morales, Pamela Morales                 ACCOUNT NO.:  [id number]   MEDICAL RECORD NO.:  [id number]          PATIENT TYPE:  INP   LOCATION:  5156                         FACILITY:  [facility]   PHYSICIAN:  Loreta Ave, MD DATE OF BIRTH:  1940/07/24   DATE OF PROCEDURE:  03/19/2008  DATE OF DISCHARGE:                               OPERATIVE REPORT   PREOPERATIVE DIAGNOSIS:  Right breast cancer.   POSTOPERATIVE DIAGNOSIS:  Right breast cancer.   PROCEDURE PERFORMED:  Claud Kelp performed a right mastectomy and  right sentinel lymph node biopsy.  This portion of the operation will be  dictated separately.  I performed a right pedicle transverse rectus  abdominus myocutaneous flap for breast reconstruction with a mesh  closure of the abdomen.   ESTIMATED BLOOD LOSS:  200 mL.   IV FLUIDS:  5700 mL of crystalloid.   URINE OUTPUT:  1300 mL.   COMPLICATIONS:  Conversion to a pedicled TRAM flap because of lack of  microsurgical instruments.   DRAINS:  Jackson-Pratt x2.   CLINICAL INDICATION:  Pamela Morales is a 70 year old Caucasian female who  presents with right-sided breast cancer, namely DCIS.  She is to undergo  mastectomy, sentinel lymph node biopsy and free TRAM flap breast  reconstruction.   After discussion of the risks of surgery which include but are not  limited to bleeding, infection, hernia formation, abdominal weakness,  scarring, damage to the nearby structures, the need for future surgery,  partial or total flap loss.  [name] understands these risks and desires to  proceed.   DESCRIPTION OF THE OPERATION:  I was called them to the operating room  as Dr. Derrell Lolling finished the sentinel lymph node biopsy and was waiting  for results from Pathology.  The patient had preoperatively received 1 g  of Ancef as antibiotic prophylaxis.  I had marked her inframammary fold  bilaterally and outlined the ellipse on her abdomen for her TRAM flap as  well preoperatively.  I began by  inspecting the right mastectomy pocket,  irrigating it with normal saline, and ensuring hemostasis with  electrocautery.  The thoracodorsal vessels were identified and the first  branches to serratus was isolated, clipping it distally.  The artery was  separated from the vein for 5 cm with tenotomy scissors and bipolar  electrocautery as use for recipient vessels for the free flap.Next,  moist warm lap sponges were packed into the right mastectomy defect and  attention was turned to the abdomen.   A lower abdominal ellipse was designed from just superior to the ASIS on  both sides running superiorly over the umbilicus and inferiorly along  the location of the patient's previous Pfannenstiel incision.  A 15 mL  of lidocaine with 1:100,000 epinephrine were injected subcutaneously  around the umbilicus along the superior incision and along the previous  Pfannenstiel incision.  Care was taken not to inject epinephrine around  the SIEA vessels bilaterally.  After waiting for the hemostatic effects  of epinephrine to take place, the superior incision was made with a 10-  blade and the umbilicus was cut  free as a triangle with its apex  inferiorly.  Plenty of fat was left around the umbilical stock to  provide good vascularity to the umbilicus.  Electrocautery was used to  carry the incision of the superior aspect of the abdominal ellipse down  to abdominal wall fascia.  This was then carried up to the level of the  costal margins to allow mobilization of the superior half of the  abdomen.  Next, the Pfannenstiel incision was re-cut with a 10-blade and  dissection proceeded to the abdominal fascia with electrocautery.  Next,  the skin was incised on both sides over the SIEA vessels.  The  electrocautery was turned down to 15 and dissection proceeded to reveal  the absence of SIEA vessels bilaterally.  After this, the left side of  the flap was elevated from lateral to medial encountering one  lateral  row perforator.  The medial row perforator was encountered inferiorly on  the left hemiabdomen.  Attention was then turned to the right side of  the flap and one lateral row perforator was encountered.  This was  clipped and divided.  Next, two medial row perforators were encountered  and they were left attached.   An incision was made in the abdominal fascia on the right side over the  deep inferior epigastric vessels and these were traced to the origin  with tenotomy scissors.  Branches were clipped and divided allowing the  vessels to be free of underlying attachments and from each other.  The  artery and two vena commitantes were all approximately 3 mm in diameter.  The decision was made to use the right hemiabdomen as a free flap,  however, microinstruments remained unavailable at this point, the deep  inferior epigastric vessels were left intact for the time being to wait  for instruments.  Next, preparations were made to proceed with the  pedicle TRAM encased the instruments were never available.  The fascia  was split, running superiorly along the lateral edge of the rectus  muscle.  The fascial strip approximately 3 cm wide was taken  longitudinally to the costal margin.  This was done with combination of  15-blade and bipolar electrocautery.  The muscle was then freed from its  underlying attachments and perforating vessels were clipped and divided  with tenotomy scissors.  Once the muscle was entirely free from its  underlying attachments and the fascia has been split superiorly around  the perforators, the decision was made, because there was still no  microvascular equipment, to proceed with a pedicled TRAM flap.  The  inferior epigastric vessels were clipped and divided with tenotomy  scissors.  Next, the inferior aspect of the rectus muscle was divided  with electrocautery with coagulation set on 60.  This allowed  transposition of the TRAM flap on the right  rectus muscle.  The tunnel  was made subcutaneously along the medial third of the inframammary fold  on the right breast.  The TRAM flap was then passed into the mastectomy  defect, stapled into place with the skin and warm moist lap sponges were  applied to the TRAM flap.  The fascia was repaired with interrupted  buried figure-of-eight 0 Prolene sutures.  Next, an onlay Prolene mesh 6  x 6 inches was laid in diagonal fashion over the fascial defect.  A hole  was cut for the umbilicus and this was sewn into place with running 2-0  Prolene sutures.  Next, two 19-French round Blake drains  were placed in  the abdominal wound via separate stab incisions and secured to the skin  with silk sutures.  The abdomen was irrigated with normal saline and  hemostasis verified with electrocautery.  The Scarpa fascia was closed  with interrupted 2-0 PDS sutures and the skin was closed with 3-0  interrupted Monocryl sutures and a running 4-0 subcuticular Monocryl  suture.  At the level of the umbilicus, a chevron incision was created  and the umbilicus was delivered to the skin.  A vertical slit was made  in the superior aspect of the umbilicus.  The chevron was then inset  from the upper abdominal skin into this slit made on the umbilicus and  the skin around the umbilicus was closed with interrupted buried 3-0  Monocryl sutures.  It was noted that the umbilicus was pink and viable.   Attention was then turned to the right breast.  The TRAM flap was  inspected for vascularity and found to be pink with excellent capillary  refill.  Zone 4, the left lateral portion of the TRAM flap was excised  with a 10-blade.  Bleeding was noted on the edges.  This was controlled  with electrocautery.  Next, the two lateral, superior edges of the TRAM  flap were sewn together to create superior fullness in the new breast.  The flap was inset and the skin was marked to delineate the skin paddle  of the TRAM flap.  Next,  the flap was removed and de-epithelialized  along projected buried portions of the TRAM flap.  This was done with  black-handled scissors.  Hemostasis was controlled along the dermis with  electrocautery.  Next, the flap was inset with interrupted 2-0 Vicryls.  Hemostasis of the mastectomy pocket was again verified with  electrocautery and after irrigation.  Two 19-French round Blake drains  were placed in the midaxillary line and draped through the mastectomy  defect; one superiorly and one inferiorly.  These were secured to the  skin with silk sutures.  The skin of the TRAM flap was then sewn into  the mastectomy defect with interrupted buried 3-0 Monocryl sutures and a  running 4-0 subcuticular Monocryl.  Sponge  and needle counts were reported as correct x2.  The Dermabond and Steri-  Strips were then applied and Xeroform was packed into the umbilicus.  The patient was then extubated in semi-Fowler's position and transported  to the recovery room in stable condition.      Loreta Ave, MD  Electronically Signed     CF/MEDQ  D:  03/20/2008  T:  03/21/2008  Job:  [id number]

## 2010-06-01 ENCOUNTER — Encounter (INDEPENDENT_AMBULATORY_CARE_PROVIDER_SITE_OTHER): Payer: Self-pay | Admitting: General Surgery

## 2010-06-10 ENCOUNTER — Other Ambulatory Visit: Payer: Self-pay | Admitting: *Deleted

## 2010-06-10 MED ORDER — AMLODIPINE BESYLATE 10 MG PO TABS: 10.0000 mg | ORAL_TABLET | Freq: Every day | ORAL | Status: DC

## 2010-07-09 ENCOUNTER — Other Ambulatory Visit: Payer: Self-pay | Admitting: *Deleted

## 2010-07-09 MED ORDER — AMLODIPINE BESYLATE 10 MG PO TABS: 10.0000 mg | ORAL_TABLET | Freq: Every day | ORAL | Status: DC

## 2010-07-10 ENCOUNTER — Encounter: Payer: Self-pay | Admitting: Family Medicine

## 2010-07-15 ENCOUNTER — Encounter: Payer: Self-pay | Admitting: Family Medicine

## 2010-07-16 ENCOUNTER — Ambulatory Visit (INDEPENDENT_AMBULATORY_CARE_PROVIDER_SITE_OTHER): Payer: MEDICARE | Admitting: Family Medicine

## 2010-07-16 ENCOUNTER — Encounter: Payer: Self-pay | Admitting: Family Medicine

## 2010-07-16 VITALS — BP 128/64 | HR 60 | Temp 97.7°F | Ht 60.0 in | Wt 129.8 lb

## 2010-07-16 DIAGNOSIS — M949 Disorder of cartilage, unspecified: Secondary | ICD-10-CM

## 2010-07-16 DIAGNOSIS — D126 Benign neoplasm of colon, unspecified: Secondary | ICD-10-CM

## 2010-07-16 DIAGNOSIS — M899 Disorder of bone, unspecified: Secondary | ICD-10-CM

## 2010-07-16 DIAGNOSIS — I10 Essential (primary) hypertension: Secondary | ICD-10-CM

## 2010-07-16 DIAGNOSIS — K635 Polyp of colon: Secondary | ICD-10-CM | POA: Insufficient documentation

## 2010-07-16 LAB — CBC WITH DIFFERENTIAL/PLATELET
Basophils Absolute: 0 10*3/uL (ref 0.0–0.1)
Basophils Relative: 0.3 % (ref 0.0–3.0)
Eosinophils Absolute: 0.1 10*3/uL (ref 0.0–0.7)
Lymphocytes Relative: 30.8 % (ref 12.0–46.0)
MCHC: 33.8 g/dL (ref 30.0–36.0)
MCV: 83.4 fl (ref 78.0–100.0)
Monocytes Absolute: 0.5 10*3/uL (ref 0.1–1.0)
Neutrophils Relative %: 61.7 % (ref 43.0–77.0)
Platelets: 261 10*3/uL (ref 150.0–400.0)
RBC: 4.29 Mil/uL (ref 3.87–5.11)
RDW: 14 % (ref 11.5–14.6)

## 2010-07-16 LAB — LDL CHOLESTEROL, DIRECT: Direct LDL: 151.3 mg/dL

## 2010-07-16 LAB — LIPID PANEL
Cholesterol: 226 mg/dL — ABNORMAL HIGH (ref 0–200)
Total CHOL/HDL Ratio: 4
Triglycerides: 153 mg/dL — ABNORMAL HIGH (ref 0.0–149.0)
VLDL: 30.6 mg/dL (ref 0.0–40.0)

## 2010-07-16 LAB — COMPREHENSIVE METABOLIC PANEL
ALT: 17 U/L (ref 0–35)
AST: 28 U/L (ref 0–37)
Albumin: 4.6 g/dL (ref 3.5–5.2)
Alkaline Phosphatase: 65 U/L (ref 39–117)
BUN: 28 mg/dL — ABNORMAL HIGH (ref 6–23)
Creatinine, Ser: 1.1 mg/dL (ref 0.4–1.2)
Potassium: 4.5 mEq/L (ref 3.5–5.1)

## 2010-07-16 LAB — TSH: TSH: 2.12 u[IU]/mL (ref 0.35–5.50)

## 2010-07-16 MED ORDER — AMLODIPINE BESYLATE 10 MG PO TABS: 10.0000 mg | ORAL_TABLET | Freq: Every day | ORAL | Status: DC

## 2010-07-16 MED ORDER — ESTROGENS, CONJUGATED 0.625 MG/GM VA CREA: TOPICAL_CREAM | VAGINAL | Status: DC

## 2010-07-16 NOTE — Assessment & Plan Note (Signed)
Will send to last gyn for last dexa and 2 notes Stop actonel - after 5 years Disc ca and D Good exercise

## 2010-07-16 NOTE — Progress Notes (Signed)
Subjective:    Patient ID: Pamela Morales, female    DOB: [birth year], 70 y.o.   MRN: [id number]  HPI Here forf/u of HTN and osteopenia  Is doing well and having a good summer Going to beach soon   Still gets some pain in her feet   Is working out most every day  Lost a bit of weights - likes to e 125 lb   HTN in good control with 128/64- it varies a bit at home No cp or sob or palp On amlodipine Wt is down 3 lb  opthy 2 weeks ago - needs new glasses   Osteopenia on actonel Has been on it 4-5 years  Her gyn retired Dr Elana Alm - hysterectomy for fibroids in the past , no cervical cancer but ? Tumor in the past  Ca andD-- is taking that some  No fractures   Will be having some dental work - was worrried about being on evista  Needs colonoscopy referral aug 2010 -- Dr Arlyce Dice - re check polyps   Still has frequent breast cancer follow ups   Patient Active Problem List  Diagnoses  . HERPES ZOSTER  . DEPRESSION  . ESSENTIAL HYPERTENSION  . ECZEMA, ATOPIC  . ROSACEA  . BACK PAIN WITH RADICULOPATHY  . FOOT PAIN, BILATERAL  . OSTEOPENIA  . BASAL CELL CARCINOMA, HX OF  . Colon polyps   Past Medical History  Diagnosis Date  . Depression   . Cancer 02/10    breast  (est rec neg and brac neg)  . CA - cancer of ovary   . Osteopenia   . Eczema   . Hypertension   . Rosacea   . Zoster 02/09  . DDD (degenerative disc disease)     in back with chronic pain   Past Surgical History  Procedure Date  . Cholecystectomy 08/1996    10 yrs ago  . Abdominal hysterectomy 1996    BSO fibroids, incidental ovarian CA finding  . Breast surgery     rt total mastectomy for breast CA  . Lipoma removal 11/07  . Bladder tack    History  Substance Use Topics  . Smoking status: Former Smoker    Quit date: 01/03/1990  . Smokeless tobacco: Not on file  . Alcohol Use: Yes     social   Family History  Problem Relation Age of Onset  . Cancer Mother     stomach  . Hypertension  Mother   . Heart disease Mother     CAD  . Cancer Father     lung  . Cancer Sister     colon, skin  . Heart disease Sister 43    MI  . Diabetes Sister   . Heart disease Brother     HTN and MI  . Hypertension Brother    No Known Allergies Current Outpatient Prescriptions on File Prior to Visit  Medication Sig Dispense Refill  . FLUoxetine (PROZAC) 20 MG capsule Take 40 mg by mouth daily.       Marland Kitchen METRONIDAZOLE PO Take by mouth 2 (two) times daily. Tropical cream face       . Multiple Vitamin (MULTIVITAMIN) tablet Take 1 tablet by mouth daily.        . Oxycodone-Acetaminophen (PERCOCET PO) Take 7.5-325 mg by mouth every 6 (six) hours as needed.            Review of Systems Review of Systems  Constitutional: Negative for fever, appetite change,  fatigue and unexpected weight change.  Eyes: Negative for pain and visual disturbance.  Respiratory: Negative for cough and shortness of breath.   Cardiovascular: Negative.  nocp or palpitations  Gastrointestinal: Negative for nausea, diarrhea and constipation.  Genitourinary: Negative for urgency and frequency.  Skin: Negative for pallor.  Neurological: Negative for weakness, light-headedness, numbness and headaches.  Hematological: Negative for adenopathy. Does not bruise/bleed easily.  Psychiatric/Behavioral: Negative for dysphoric mood. The patient is not nervous/anxious.          Objective:   Physical Exam  Constitutional: She appears well-developed and well-nourished. No distress.  HENT:  Head: Normocephalic and atraumatic.  Mouth/Throat: Oropharynx is clear and moist.  Eyes: Conjunctivae and EOM are normal. Pupils are equal, round, and reactive to light.  Neck: Normal range of motion. Neck supple. No JVD present. No thyromegaly present.  Cardiovascular: Normal rate, regular rhythm, normal heart sounds and intact distal pulses.   Pulmonary/Chest: Effort normal and breath sounds normal. No respiratory distress. She has no  wheezes.  Abdominal: Soft. Bowel sounds are normal. She exhibits no distension and no mass. There is no tenderness.  Musculoskeletal: Normal range of motion. She exhibits no edema.  Lymphadenopathy:    She has no cervical adenopathy.  Neurological: She is alert. She has normal reflexes. Coordination normal.  Skin: Skin is warm and dry. No rash noted. No erythema. No pallor.  Psychiatric: She has a normal mood and affect.          Assessment & Plan:

## 2010-07-16 NOTE — Assessment & Plan Note (Signed)
Overdue for 1 year re check  Scheduled that

## 2010-07-16 NOTE — Patient Instructions (Signed)
Blood pressure is good  Keep up the good health habits Try to get 1200-1500 mg of calcium per day with at least 1000 iu of vitamin D - for bone health  Stop actonel- you have been on it long enough  Keep up the great exercise  Please send to gyn Dr Pamela Morales for last 2 notes and last dexa  Labs today  We will schedule your colonoscopy at check out

## 2010-07-16 NOTE — Assessment & Plan Note (Signed)
Good control  No change in medicines  Lab today Urged to stay active

## 2010-07-17 LAB — VITAMIN D 25 HYDROXY (VIT D DEFICIENCY, FRACTURES): Vit D, 25-Hydroxy: 31 ng/mL (ref 30–89)

## 2010-07-19 ENCOUNTER — Telehealth: Payer: Self-pay

## 2010-07-19 NOTE — Telephone Encounter (Signed)
Left vm for pt to callback 

## 2010-07-19 NOTE — Telephone Encounter (Signed)
Patient notified as instructed by telephone. 

## 2010-07-19 NOTE — Telephone Encounter (Signed)
Message copied by Patience Musca on Mon Jul 19, 2010  8:34 AM ------      Message from: Roxy Manns A      Created: Sun Jul 18, 2010 11:39 AM       Cholesterol is high - watch diet for sat fats carefully      Avoid red meat/ fried foods/ egg yolks/ fatty breakfast meats/ butter, cheese and high fat dairy/ and shellfish        Vit D level is mildly low - please increase daily vit D to 4000 iu per day (D3 otc)

## 2010-08-06 ENCOUNTER — Encounter: Payer: Self-pay | Admitting: Family Medicine

## 2010-08-09 ENCOUNTER — Other Ambulatory Visit: Payer: Self-pay

## 2010-08-09 NOTE — Telephone Encounter (Signed)
[facility] faxed refill request for Fluoxetine 20mg  #60 with instructions to take 1 to 2 capsules by mouth        every day. When pt was last in the office pt's med list has taking 2 capsules by mouth daily. I called pt to verify how pt is taking med and  Pt's husband said pt was not available but would pt would call back before 5pm.

## 2010-08-10 MED ORDER — FLUOXETINE HCL 20 MG PO CAPS
20.0000 mg | ORAL_CAPSULE | Freq: Every day | ORAL | Status: DC
Start: 2010-08-10 — End: 2011-01-07

## 2010-08-10 NOTE — Telephone Encounter (Signed)
Patient notified as instructed by telephone. Pt said she takes Fluoxetine 20mg  with instructions to take 1 to 2 capsules by mouth daily. Pt's med list updated.Please advise.

## 2010-08-10 NOTE — Telephone Encounter (Signed)
Will refil electronically  

## 2010-08-17 ENCOUNTER — Ambulatory Visit (AMBULATORY_SURGERY_CENTER): Payer: MEDICARE | Admitting: *Deleted

## 2010-08-17 VITALS — Ht 61.5 in | Wt 132.0 lb

## 2010-08-17 DIAGNOSIS — Z1211 Encounter for screening for malignant neoplasm of colon: Secondary | ICD-10-CM

## 2010-08-17 MED ORDER — PEG-KCL-NACL-NASULF-NA ASC-C 100 G PO SOLR: ORAL | Status: DC

## 2010-08-31 ENCOUNTER — Other Ambulatory Visit: Payer: MEDICARE | Admitting: Gastroenterology

## 2010-09-02 ENCOUNTER — Encounter: Payer: Self-pay | Admitting: Gastroenterology

## 2010-09-02 ENCOUNTER — Ambulatory Visit (AMBULATORY_SURGERY_CENTER): Payer: MEDICARE | Admitting: Gastroenterology

## 2010-09-02 DIAGNOSIS — K5730 Diverticulosis of large intestine without perforation or abscess without bleeding: Secondary | ICD-10-CM

## 2010-09-02 DIAGNOSIS — Z800 Family history of malignant neoplasm of digestive organs: Secondary | ICD-10-CM

## 2010-09-02 DIAGNOSIS — Z86010 Personal history of colonic polyps: Secondary | ICD-10-CM

## 2010-09-02 DIAGNOSIS — D1330 Benign neoplasm of unspecified part of small intestine: Secondary | ICD-10-CM

## 2010-09-02 DIAGNOSIS — D126 Benign neoplasm of colon, unspecified: Secondary | ICD-10-CM

## 2010-09-02 DIAGNOSIS — Z1211 Encounter for screening for malignant neoplasm of colon: Secondary | ICD-10-CM

## 2010-09-02 MED ORDER — SODIUM CHLORIDE 0.9 % IV SOLN: 500.0000 mL | INTRAVENOUS | Status: DC

## 2010-09-02 NOTE — Progress Notes (Signed)
Propofol administered by Brennan Bailey, CRNA  0850-pt bradycardic at 45bpm. Other vitals stable, 100% spO2 on 2L, 21 resp, 168/88 blood pressure

## 2010-09-02 NOTE — Patient Instructions (Signed)
Polyps, Colon  A polyp is extra tissue that grows inside your body. Colon polyps grow in the large intestine. The large intestine, also called the colon, is part of your digestive system. It is a long, hollow tube at the end of your digestive tract where your body makes and stores stool. Most polyps are not dangerous. They are benign. This means they are not cancerous. But over time, some types of polyps can turn into cancer. Polyps that are smaller than a pea are usually not harmful. But larger polyps could someday become or may already be cancerous. To be safe, doctors remove all polyps and test them.  WHO GETS POLYPS? Anyone can get polyps, but certain people are more likely than others. You may have a greater chance of getting polyps if:  You are over 50.   You have had polyps before.   Someone in your family has had polyps.   Someone in your family has had cancer of the large intestine.   Find out if someone in your family has had polyps. You may also be more likely to get polyps if you:   Eat a lot of fatty foods   Smoke   Drink alcohol   Do not exercise  Eat too much  SYMPTOMS Most small polyps do not cause symptoms. People often do not know they have one until their caregiver finds it during a regular checkup or while testing them for something else. Some people do have symptoms like these:  Bleeding from the anus. You might notice blood on your underwear or on toilet paper after you have had a bowel movement.   Constipation or diarrhea that lasts more than a week.   Blood in the stool. Blood can make stool look black or it can show up as red streaks in the stool.  If you have any of these symptoms, see your caregiver. HOW DOES THE DOCTOR TEST FOR POLYPS? The doctor can use four tests to check for polyps:  Digital rectal exam. The caregiver wears gloves and checks your rectum (the last part of the large intestine) to see if it feels normal. This test would find polyps only  in the rectum. Your caregiver may need to do one of the other tests listed below to find polyps higher up in the intestine.   Barium enema. The caregiver puts a liquid called barium into your rectum before taking x-rays of your large intestine. Barium makes your intestine look white in the pictures. Polyps are dark, so they are easy to see.   Sigmoidoscopy. With this test, the caregiver can see inside your large intestine. A thin flexible tube is placed into your rectum. The device is called a sigmoidoscope, which has a light and a tiny video camera in it. The caregiver uses the sigmoidoscope to look at the last third of your large intestine.   Colonoscopy. This test is like sigmoidoscopy, but the caregiver looks at all of the large intestine. It usually requires sedation. This is the most common method for finding and removing polyps.  TREATMENT  The caregiver will remove the polyp during sigmoidoscopy or colonoscopy. The polyp is then tested for cancer.   If you have had polyps, your caregiver may want you to get tested regularly in the future.  PREVENTION There is not one sure way to prevent polyps. You might be able to lower your risk of getting them if you:  Eat more fruits and vegetables and less fatty food.     Do not smoke.   Avoid alcohol.   Exercise every day.   Lose weight if you are overweight.   Eating more calcium and folate can also lower your risk of getting polyps. Some foods that are rich in calcium are milk, cheese, and broccoli. Some foods that are rich in folate are chickpeas, kidney beans, and spinach.   Aspirin might help prevent polyps. Studies are under way.  Document Released: 09/16/2003 Document Re-Released: 06/09/2009 [employer] Patient Information 2011 [employer]  .Diverticulosis Diverticulosis is a common condition that develops when small pouches (diverticula) form in the wall of the colon. The risk of diverticulosis increases with age. It happens more  often in people who eat a low-fiber diet. Most individuals with diverticulosis have no symptoms. Those individuals with symptoms usually experience belly (abdominal) pain, constipation, or loose stools (diarrhea). HOME CARE INSTRUCTIONS  Increase the amount of fiber in your diet as directed by your caregiver or dietician. This may reduce symptoms of diverticulosis.   Your caregiver may recommend taking a dietary fiber supplement.   Drink at least 6 to 8 glasses of water each day to prevent constipation.   Try not to strain when you have a bowel movement.   Your caregiver may recommend avoiding nuts and seeds to prevent complications, although this is still an uncertain benefit.   Only take over-the-counter or prescription medicines for pain, discomfort, or fever as directed by your caregiver.  FOODS HAVING HIGH FIBER CONTENT INCLUDE:  Fruits. Apple, peach, pear, tangerine, raisins, prunes.   Vegetables. Brussels sprouts, asparagus, broccoli, cabbage, carrot, cauliflower, romaine lettuce, spinach, summer squash, tomato, winter squash, zucchini.   Starchy Vegetables. Baked beans, kidney beans, lima beans, split peas, lentils, potatoes (with skin).   Grains. Whole wheat bread, brown rice, bran flake cereal, plain oatmeal, white rice, shredded wheat, bran muffins.  SEEK IMMEDIATE MEDICAL CARE IF:  You develop increasing pain or severe bloating.   You have an oral temperature above 100, not controlled by medicine.   You develop vomiting or bowel movements that are bloody or black.  Document Released: 09/17/2003 Document Re-Released: 06/09/2009 [employer] Patient Information 2011 [employer]. 

## 2010-09-03 ENCOUNTER — Telehealth: Payer: Self-pay

## 2010-09-03 NOTE — Telephone Encounter (Signed)

## 2011-01-03 ENCOUNTER — Telehealth: Payer: Self-pay | Admitting: Internal Medicine

## 2011-01-03 NOTE — Telephone Encounter (Signed)
If she really cannot swallow- I adv going to UC now - since we have not availibility (or if fever ) Otherwise I recommend chloraseptic throat spray or losenges to ease discomfort along with salt water gargles

## 2011-01-03 NOTE — Telephone Encounter (Signed)
Patient called and stated she has a sore throat and tongue is sore and she can't swallow.  She has no fever, she did start off with congestion but she states that has subsided,  She has tried Zyrtec and didn't help.  Would like to know what you would suggest until her appointment on Friday.  Please advise.

## 2011-01-03 NOTE — Telephone Encounter (Signed)
Patient notified as instructed by telephone. Was advised by patient that she can swallow and does not have a fever. Patient stated that she will try what Dr. Milinda Antis recommends.

## 2011-01-07 ENCOUNTER — Encounter: Payer: Self-pay | Admitting: Family Medicine

## 2011-01-07 ENCOUNTER — Ambulatory Visit (INDEPENDENT_AMBULATORY_CARE_PROVIDER_SITE_OTHER): Payer: MEDICARE | Admitting: Family Medicine

## 2011-01-07 VITALS — BP 148/76 | HR 56 | Temp 97.9°F | Wt 132.0 lb

## 2011-01-07 DIAGNOSIS — G479 Sleep disorder, unspecified: Secondary | ICD-10-CM | POA: Insufficient documentation

## 2011-01-07 DIAGNOSIS — B370 Candidal stomatitis: Secondary | ICD-10-CM | POA: Insufficient documentation

## 2011-01-07 DIAGNOSIS — J029 Acute pharyngitis, unspecified: Secondary | ICD-10-CM

## 2011-01-07 LAB — POCT RAPID STREP A (OFFICE): Rapid Strep A Screen: NEGATIVE

## 2011-01-07 MED ORDER — FLUOXETINE HCL 20 MG PO CAPS: 20.0000 mg | ORAL_CAPSULE | Freq: Every day | ORAL | Status: DC

## 2011-01-07 MED ORDER — NYSTATIN 100000 UNIT/ML MT SUSP: 500000.0000 [IU] | Freq: Four times a day (QID) | OROMUCOSAL | Status: AC

## 2011-01-07 MED ORDER — AMLODIPINE BESYLATE 10 MG PO TABS: 10.0000 mg | ORAL_TABLET | Freq: Every day | ORAL | Status: DC

## 2011-01-07 NOTE — Patient Instructions (Signed)
Thrush ( a yeast infection of the mouth) may be the cause of throat and mouth pain  Try the nystatin mouthwash for that  Stop zinc losenges -this may also irritating mouth  If sleep remains a problems - try melantonin over the counter  Update if not starting to improve in a week or if worsening

## 2011-01-07 NOTE — Assessment & Plan Note (Signed)
General irritation in mouth and throat with some interruption of taste/ dry feeling and scant white coating  ? If zinc losenges could add to the irritaion- so adv to stop those  Will tx with nystatin mouthwash qid and update Update if not starting to improve in a week or if worsening   Rapid strep neg today

## 2011-01-07 NOTE — Assessment & Plan Note (Signed)
Trouble getting back on track after cross country travel Suggested a trial of melatonin if needed otc and update if not helpful  Also good exercise/sleep hygiene

## 2011-01-07 NOTE — Progress Notes (Signed)
Subjective:    Patient ID: Pamela Morales, female    DOB: 1940/02/15, 71 y.o.   MRN: [id number]  HPI Here for sore throat present for 1.5 weeks Now is less sore  Tongue is now irritated and cannot taste well / bumps and white in color Mouth is really dry  Had a bit of sinus drainage Zyrtec did not help- so she stopped it  Monday no fever -- no aches but had occasional chills  Not sleeping well in general - after traveling from [place]  Strep test neg today  A little irritative cough  occ heartburn too - not bad but more than usual - takes tums -- not even one per week    Got a flu shot -- oct   Patient Active Problem List  Diagnoses  . HERPES ZOSTER  . DEPRESSION  . ESSENTIAL HYPERTENSION  . ECZEMA, ATOPIC  . ROSACEA  . BACK PAIN WITH RADICULOPATHY  . FOOT PAIN, BILATERAL  . OSTEOPENIA  . BASAL CELL CARCINOMA, HX OF  . Colon polyps  . Thrush  . Sleep disorder   Past Medical History  Diagnosis Date  . Depression   . Osteopenia   . Eczema   . Hypertension   . Rosacea   . Zoster 02/09  . 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 Cancer 02/10    breast  (est rec neg and brac neg)  . CA - cancer of ovary   . Adenomatous polyp of colon    Past Surgical History  Procedure Date  . Cholecystectomy 08/1996    10 yrs ago  . Abdominal hysterectomy 1996    BSO fibroids, incidental ovarian CA finding  . Breast surgery     rt total mastectomy for breast CA  . Lipoma removal 11/07  . Bladder tack   . Colonoscopy    History  Substance Use Topics  . Smoking status: Former Smoker    Quit date: 01/03/1990  . Smokeless tobacco: Never Used  . Alcohol Use: 1.0 oz/week    2 drink(s) per week     social   Family History  Problem Relation Age of Onset  . Cancer Mother     stomach  . Hypertension Mother   . Heart disease Mother     CAD  . Stomach cancer Mother   . Cancer Father     lung  . Cancer Sister     colon, skin  . Heart disease  Sister 59    MI  . Diabetes Sister   . Colon cancer Sister   . Heart disease Brother     HTN and MI  . Hypertension Brother   . Esophageal cancer Neg Hx    No Known Allergies Current Outpatient Prescriptions on File Prior to Visit  Medication Sig Dispense Refill  . conjugated estrogens (PREMARIN) vaginal cream Place vaginally 3 (three) times a week.  42.5 g  3  . METRONIDAZOLE PO Take by mouth 2 (two) times daily. Tropical cream face       . Multiple Vitamin (MULTIVITAMIN) tablet Take 1 tablet by mouth daily.        . Oxycodone-Acetaminophen (PERCOCET PO) Take 7.5-325 mg by mouth every 6 (six) hours as needed.          Review of Systems     Objective:   Physical Exam  Constitutional: She appears well-developed and well-nourished.  HENT:  Head: Normocephalic and atraumatic.  Right  Ear: External ear normal.  Left Ear: External ear normal.  Mouth/Throat: No oropharyngeal exudate.       Nares are boggy Tongue and throat -mildly hyperemic without swelling Scant white non scrapable coating on tongue  No throat exudate  Eyes: Conjunctivae and EOM are normal. Pupils are equal, round, and reactive to light. Right eye exhibits no discharge. Left eye exhibits no discharge.  Neck: Normal range of motion. Neck supple. No JVD present. No thyromegaly present.  Cardiovascular: Normal rate and regular rhythm.   Pulmonary/Chest: Effort normal and breath sounds normal. No respiratory distress. She has no wheezes.  Lymphadenopathy:    She has no cervical adenopathy.  Skin: Skin is warm and dry. No rash noted. No erythema. No pallor.  Psychiatric: She has a normal mood and affect.          Assessment & Plan:

## 2011-04-11 ENCOUNTER — Ambulatory Visit (INDEPENDENT_AMBULATORY_CARE_PROVIDER_SITE_OTHER): Payer: MEDICARE | Admitting: General Surgery

## 2011-04-11 ENCOUNTER — Encounter (INDEPENDENT_AMBULATORY_CARE_PROVIDER_SITE_OTHER): Payer: Self-pay | Admitting: General Surgery

## 2011-04-11 VITALS — BP 142/84 | HR 56 | Temp 98.2°F | Resp 24 | Ht 61.0 in | Wt 137.4 lb

## 2011-04-11 DIAGNOSIS — Z86000 Personal history of in-situ neoplasm of breast: Secondary | ICD-10-CM

## 2011-04-11 DIAGNOSIS — Z853 Personal history of malignant neoplasm of breast: Secondary | ICD-10-CM

## 2011-04-11 NOTE — Progress Notes (Signed)
Patient ID: Pamela Morales, female   DOB: April 12, 1940, 71 y.o.   MRN: [id number]  Chief Complaint  Patient presents with  . Breast Cancer Long Term Follow Up    Rt breast    HPI Pamela Morales is a 71 y.o. female.  She returns for long-term followup regarding her right breast cancer.  This patient underwent right total mastectomy, sentinel lymph node biopsy by me and TRAM flap reconstruction by Dr. Noelle Penner on March 19, 2008. 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 Pathologic stage Tis, N0. She has no known recurrence to date.  She is doing well. No breast complaints. She has had no new medical events in the last year. She is scheduled for mammograms next week.  HPI  Past Medical History  Diagnosis Date  . Depression   . Osteopenia   . Eczema   . Hypertension   . Rosacea   . Zoster 02/09  . 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 Adenomatous polyp of colon   . 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 CA - cancer of ovary     Past Surgical History  Procedure Date  . Cholecystectomy 08/1996    10 yrs ago  . Abdominal hysterectomy 1996    BSO fibroids, incidental ovarian CA finding  . Breast surgery     rt total mastectomy for breast CA  . Lipoma removal 11/07  . Bladder tack   . Colonoscopy     Family History  Problem Relation Age of Onset  . Cancer Mother     stomach  . Hypertension Mother   . Heart disease Mother     CAD  . Stomach cancer Mother   . Cancer Father     lung  . Cancer Sister     colon, skin  . Heart disease Sister 34    MI  . Diabetes Sister   . Colon cancer Sister   . Heart disease Brother     HTN and MI  . Hypertension Brother   . Esophageal cancer Neg Hx     Social History History  Substance Use Topics  . Smoking status: Former Smoker    Quit date: 01/03/1990  . Smokeless tobacco: Never Used  . Alcohol Use: 1.0 oz/week    2 drink(s) per week     social    No Known  Allergies  Current Outpatient Prescriptions  Medication Sig Dispense Refill  . amLODipine (NORVASC) 10 MG tablet Take 1 tablet (10 mg total) by mouth daily.  90 tablet  3  . conjugated estrogens (PREMARIN) vaginal cream Place vaginally 3 (three) times a week.  42.5 g  3  . FLUoxetine (PROZAC) 20 MG capsule Take 1-2 capsules (20-40 mg total) by mouth daily.  180 capsule  3  . METRONIDAZOLE PO Take by mouth 2 (two) times daily. Tropical cream face       . Multiple Vitamin (MULTIVITAMIN) tablet Take 1 tablet by mouth daily.        . Oxycodone-Acetaminophen (PERCOCET PO) Take 7.5-325 mg by mouth every 6 (six) hours as needed.         Review of Systems Review of Systems  Constitutional: Negative for fever, chills and unexpected weight change.  HENT: Negative for hearing loss, congestion, sore throat, trouble swallowing and voice change.   Eyes: Negative for visual disturbance.  Respiratory: Negative for cough  and wheezing.   Cardiovascular: Negative for chest pain, palpitations and leg swelling.  Gastrointestinal: Negative for nausea, vomiting, abdominal pain, diarrhea, constipation, blood in stool, abdominal distention and anal bleeding.  Genitourinary: Negative for hematuria, vaginal bleeding and difficulty urinating.  Musculoskeletal: Negative for arthralgias.  Skin: Negative for rash and wound.  Neurological: Negative for seizures, syncope and headaches.  Hematological: Negative for adenopathy. Does not bruise/bleed easily.  Psychiatric/Behavioral: Negative for confusion.    Blood pressure 142/84, pulse 56, temperature 98.2 F (36.8 C), temperature source Temporal, resp. rate 24, height 5\' 1"  (1.549 m), weight 137 lb 6.4 oz (62.324 kg).  Physical Exam Physical Exam  Constitutional: She is oriented to person, place, and time. She appears well-developed and well-nourished. No distress.  HENT:  Head: Normocephalic and atraumatic.  Nose: Nose normal.  Mouth/Throat: No oropharyngeal  exudate.  Eyes: Conjunctivae and EOM are normal. Pupils are equal, round, and reactive to light. Left eye exhibits no discharge. No scleral icterus.  Neck: Neck supple. No JVD present. No tracheal deviation present. No thyromegaly present.  Cardiovascular: Normal rate, regular rhythm, normal heart sounds and intact distal pulses.   No murmur heard. Pulmonary/Chest: Effort normal and breath sounds normal. No respiratory distress. She has no wheezes. She has no rales. She exhibits no tenderness.    Abdominal: Soft. Bowel sounds are normal. She exhibits no distension and no mass. There is no tenderness. There is no rebound and no guarding.  Musculoskeletal: She exhibits no edema and no tenderness.  Lymphadenopathy:    She has no cervical adenopathy.  Neurological: She is alert and oriented to person, place, and time. She exhibits normal muscle tone. Coordination normal.  Skin: Skin is warm. No rash noted. She is not diaphoretic. No erythema. No pallor.  Psychiatric: She has a normal mood and affect. Her behavior is normal. Judgment and thought content normal.    Data Reviewed I reviewed all of my old records, imaging studies and pathology reports.  Assessment    High grade DCIS right breast, receptor negative.  No evidence of recurrence 3 years following right total mastectomy, sentinel lymph node biopsy and TRAM reconstruction.    Plan    Obtain mammograms next week on schedule.  Return to see me in 13 months for annual checkup  I told her that if she desired to transfer her annual breast surveillance to her primary care physician, that would be fine, but until she discusses that with Dr. Milinda Antis, I will see her annually.       Pamela Morales. Derrell Lolling, M.D., [facility] General and Minimally invasive Surgery Breast and Colorectal Surgery Office:   [phone] Pager:   [phone]  04/11/2011, 2:45 PM

## 2011-04-11 NOTE — Patient Instructions (Signed)
Your physical exam today is normal. There is no evidence of breast cancer.  Be sure to get your mammograms next week on schedule.  Return to see me in 13 months after you  get your annual mammograms. If you and Dr. Milinda Antis decide that she would like to assume breast cancer surveillance screening, that is very reasonable. Otherwise I will see you in 13 months.

## 2011-04-21 ENCOUNTER — Encounter: Payer: Self-pay | Admitting: Family Medicine

## 2011-04-22 ENCOUNTER — Encounter: Payer: Self-pay | Admitting: *Deleted

## 2011-08-17 ENCOUNTER — Telehealth: Payer: Self-pay

## 2011-08-17 MED ORDER — SCOPOLAMINE 1 MG/3DAYS TD PT72: 1.0000 | MEDICATED_PATCH | TRANSDERMAL | Status: DC

## 2011-08-17 NOTE — Telephone Encounter (Signed)
Pt left v/m requesting med for motion sickness prevention; pt going on 5 day cruise.[place].Please advise.

## 2011-08-17 NOTE — Telephone Encounter (Signed)
Can try the scopalamine patch  Use caution of sedation  Will refill electronically

## 2011-08-18 NOTE — Telephone Encounter (Signed)
Patient informed. 

## 2011-08-19 ENCOUNTER — Other Ambulatory Visit: Payer: Self-pay

## 2011-08-19 NOTE — Telephone Encounter (Signed)
Pt left v/m Transderm cost $64.00;too expensive. Pt request less expensive med for motion sickness sent to [facility].Please advise.

## 2011-08-21 NOTE — Telephone Encounter (Signed)
Next best option is to buy dramamine otc and take as directed for motion sickness

## 2011-08-22 NOTE — Telephone Encounter (Signed)
Patient notified as instructed by telephone. 

## 2011-10-01 ENCOUNTER — Ambulatory Visit (INDEPENDENT_AMBULATORY_CARE_PROVIDER_SITE_OTHER): Payer: MEDICARE | Admitting: Family Medicine

## 2011-10-01 ENCOUNTER — Encounter: Payer: Self-pay | Admitting: Family Medicine

## 2011-10-01 VITALS — BP 140/70 | HR 51 | Temp 97.6°F | Wt 131.0 lb

## 2011-10-01 DIAGNOSIS — M62838 Other muscle spasm: Secondary | ICD-10-CM | POA: Insufficient documentation

## 2011-10-01 MED ORDER — CYCLOBENZAPRINE HCL 10 MG PO TABS: 10.0000 mg | ORAL_TABLET | Freq: Three times a day (TID) | ORAL | Status: DC | PRN

## 2011-10-01 NOTE — Patient Instructions (Addendum)
You have a muscle spasm in the neck  Use warm compresses like a heating pad  Use a cervical support pillow  Try the flexeril with caution- can sedate  Aleve is ok with food up to twice daily Update if not starting to improve in a week or if worsening

## 2011-10-01 NOTE — Progress Notes (Signed)
Subjective:    Patient ID: Pamela Morales, female    DOB: 03/31/1940, 71 y.o.   MRN: [id number]  HPI Here with neck pain  4 days- just woke up with it  Right side- tight and sore - is pulling on the side of her head and rad down her back  Worse in bed at night   No trauma or new activity Goes to gym- no new things or machines   No numbness or weakness   Not using a cervical support pillow but has one  Patient Active Problem List  Diagnosis  . HERPES ZOSTER  . DEPRESSION  . ESSENTIAL HYPERTENSION  . ECZEMA, ATOPIC  . ROSACEA  . BACK PAIN WITH RADICULOPATHY  . FOOT PAIN, BILATERAL  . OSTEOPENIA  . BASAL CELL CARCINOMA, HX OF  . Colon polyps  . Thrush  . Sleep disorder  . History of carcinoma in situ of breast   Past Medical History  Diagnosis Date  . Depression   . Osteopenia   . Eczema   . Hypertension   . Rosacea   . Zoster 02/09  . 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 Adenomatous polyp of colon   . 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 CA - cancer of ovary    Past Surgical History  Procedure Date  . Cholecystectomy 08/1996    10 yrs ago  . Abdominal hysterectomy 1996    BSO fibroids, incidental ovarian CA finding  . Breast surgery     rt total mastectomy for breast CA  . Lipoma removal 11/07  . Bladder tack   . Colonoscopy    History  Substance Use Topics  . Smoking status: Former Smoker    Quit date: 01/03/1990  . Smokeless tobacco: Never Used  . Alcohol Use: 1.0 oz/week    2 drink(s) per week     social   Family History  Problem Relation Age of Onset  . Cancer Mother     stomach  . Hypertension Mother   . Heart disease Mother     CAD  . Stomach cancer Mother   . Cancer Father     lung  . Cancer Sister     colon, skin  . Heart disease Sister 70    MI  . Diabetes Sister   . Colon cancer Sister   . Heart disease Brother     HTN and MI  . Hypertension Brother   . Esophageal cancer Neg  Hx    No Known Allergies Current Outpatient Prescriptions on File Prior to Visit  Medication Sig Dispense Refill  . amLODipine (NORVASC) 10 MG tablet Take 1 tablet (10 mg total) by mouth daily.  90 tablet  3  . conjugated estrogens (PREMARIN) vaginal cream Place vaginally 3 (three) times a week.  42.5 g  3  . FLUoxetine (PROZAC) 20 MG capsule Take 1-2 capsules (20-40 mg total) by mouth daily.  180 capsule  3  . METRONIDAZOLE PO Take by mouth 2 (two) times daily. Tropical cream face       . Multiple Vitamin (MULTIVITAMIN) tablet Take 1 tablet by mouth daily.        . Oxycodone-Acetaminophen (PERCOCET PO) Take 7.5-325 mg by mouth every 6 (six) hours as needed.       Marland Kitchen scopolamine (TRANSDERM-SCOP) 1.5 MG Place 1 patch (1.5 mg total) onto the skin every 3 (three)  days.  4 patch  1      Review of Systems Review of Systems  Constitutional: Negative for fever, appetite change, fatigue and unexpected weight change.  Eyes: Negative for pain and visual disturbance.  Respiratory: Negative for cough and shortness of breath.   Cardiovascular: Negative for cp or palpitations    Gastrointestinal: Negative for nausea, diarrhea and constipation.  Genitourinary: Negative for urgency and frequency.  Skin: Negative for pallor or rash   MSK pos for R sided neck pain, neg for joint pain or swelling  Neurological: Negative for weakness, light-headedness, numbness and headaches.  Hematological: Negative for adenopathy. Does not bruise/bleed easily.  Psychiatric/Behavioral: Negative for dysphoric mood. The patient is not nervous/anxious.         Objective:   Physical Exam  Constitutional: She appears well-developed and well-nourished.  HENT:  Head: Normocephalic and atraumatic.  Mouth/Throat: Oropharynx is clear and moist.  Eyes: Conjunctivae normal and EOM are normal. Pupils are equal, round, and reactive to light. Right eye exhibits no discharge. Left eye exhibits no discharge. No scleral icterus.    Neck: Muscular tenderness present. No spinous process tenderness present. Decreased range of motion present. No tracheal deviation and no erythema present. No thyromegaly present.       Tender R pericervical musculature  Pain to flex and rotate neck- limited rom No bony tenderness R trap tenderness No crepitus  Cardiovascular: Normal rate and regular rhythm.   Pulmonary/Chest: Effort normal and breath sounds normal. No respiratory distress.  Musculoskeletal: She exhibits tenderness. She exhibits no edema.  Lymphadenopathy:    She has no cervical adenopathy.  Neurological: She is alert.  Skin: Skin is warm and dry. No rash noted. No erythema.  Psychiatric: She has a normal mood and affect.          Assessment & Plan:

## 2011-10-01 NOTE — Assessment & Plan Note (Signed)
R side - with rad into trapezius area No neuro s/s  will try flexeril / heat/ cervical pillow If worse or neuro change- will call and consider films/ PT

## 2011-10-03 ENCOUNTER — Telehealth: Payer: Self-pay | Admitting: Family Medicine

## 2011-10-03 NOTE — Telephone Encounter (Signed)
[facility] Triage Call Report Triage Record Num: [id number] Operator: Di Kindle Patient Name: Pamela Morales Call Date & Time: 10/01/2011 9:12:28AM Patient Phone: [phone] PCP: Audrie Gallus. Tower Patient Gender: Female PCP Fax : Patient DOB: [birth year] Practice Name: [facility] Reason for Call: Caller: [name]/Patient; PCP: Roxy Manns [facility]); CB#: [phone]; Call regarding Neck pain; calling for appointment, same scheduled 1215 10/01/11 at [facility] [place] office. Protocol(s) Used: Information Only Call; No Symptom Triage (Adult) Recommended Outcome per Protocol: Call Provider When Office is Open Reason for Outcome: Requesting regular office appointment Care Advice: ~ 10/01/2011 9:29:08AM Page 1 of 1 [id number]

## 2011-10-03 NOTE — Telephone Encounter (Signed)
I saw her in Saturday clinic

## 2011-10-11 ENCOUNTER — Ambulatory Visit (INDEPENDENT_AMBULATORY_CARE_PROVIDER_SITE_OTHER): Payer: MEDICARE

## 2011-10-11 DIAGNOSIS — Z23 Encounter for immunization: Secondary | ICD-10-CM

## 2011-10-25 LAB — HM DEXA SCAN

## 2011-10-28 ENCOUNTER — Encounter: Payer: Self-pay | Admitting: Family Medicine

## 2012-01-03 ENCOUNTER — Other Ambulatory Visit: Payer: Self-pay | Admitting: Family Medicine

## 2012-01-03 MED ORDER — FLUOXETINE HCL 20 MG PO CAPS: 20.0000 mg | ORAL_CAPSULE | Freq: Every day | ORAL | Status: DC

## 2012-01-03 MED ORDER — AMLODIPINE BESYLATE 10 MG PO TABS: 10.0000 mg | ORAL_TABLET | Freq: Every day | ORAL | Status: DC

## 2012-01-03 NOTE — Telephone Encounter (Signed)
Ok to refill 

## 2012-01-03 NOTE — Telephone Encounter (Signed)
She can have 6 mo of refils on both, thanks

## 2012-04-25 ENCOUNTER — Encounter: Payer: Self-pay | Admitting: Family Medicine

## 2012-04-27 ENCOUNTER — Ambulatory Visit (INDEPENDENT_AMBULATORY_CARE_PROVIDER_SITE_OTHER): Payer: MEDICARE | Admitting: General Surgery

## 2012-04-27 ENCOUNTER — Encounter: Payer: Self-pay | Admitting: *Deleted

## 2012-04-27 ENCOUNTER — Encounter (INDEPENDENT_AMBULATORY_CARE_PROVIDER_SITE_OTHER): Payer: Self-pay | Admitting: General Surgery

## 2012-04-27 VITALS — BP 102/62 | HR 53 | Temp 97.7°F | Resp 18 | Ht 60.0 in | Wt 131.0 lb

## 2012-04-27 DIAGNOSIS — Z853 Personal history of malignant neoplasm of breast: Secondary | ICD-10-CM

## 2012-04-27 DIAGNOSIS — Z86000 Personal history of in-situ neoplasm of breast: Secondary | ICD-10-CM

## 2012-04-27 NOTE — Patient Instructions (Signed)
Examination of your breasts and all of the regional lymph nodes is normal. There is no evidence of cancer.  Your recent mammograms are also normal.  You may be developing a tiny hernia in the right upper quadrant of the abdomen underneath one of the old scars. We need to keep an eye on this and let us know if it becomes more painful or enlarges. I would not do anything about it right now  Return to see Dr. Derrell Lolling in one year, after you get your annual mammograms.

## 2012-04-27 NOTE — Progress Notes (Signed)
Patient ID: Pamela Morales, female   DOB: Dec 31, 1940, 72 y.o.   MRN: [id number] History: This patient returns for long-term followup regarding her right breast cancer. She underwent right total mastectomy, sentinel lymph node biopsy and TRAM flap reconstruction by Dr. Noelle Penner on 03/19/2008. 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 Pathologic stage Tis, N0. There is no evidence of recurrence to date. She was evaluated and discharged by her medical oncologist. Recent mammograms at [facility], dated 04/24/1912 fine. No focal abnormality, category 2. She has no new health problems. She feels fine. She has no complaints about her breast. She exercises daily and feels well. She wanted me to look at a little bulge in her abdomen  ROS: 10 system review of systems is negative except as described above  Exam: Patient looks well. No distress Neck no adenopathy or mass Lungs clear to auscultation bilaterally Heart regular rate and rhythm, no murmur, no ectopy Breast right mastectomy with TRAM reconstruction looks good. Soft. No nodules or ulcerations. No axillary adenopathy. No arm swelling. Full range of motion right shoulder. Left breast symmetrical. Soft. No palpable mass. No skin changes. No axillary adenopathy. Abdomen TRAM reconstruction scars. There is a small scar in the right upper quadrant in the rectus sheath area. When she stands there seems to be a small hernia bulge that is reducible. No overlying skin problems.  Assessment: High-grade DCIS right breast, 3.5 cm, receptor negative No evidence of recurrence 4 years following right total mastectomy sentinel node biopsy and TRAM reconstruction Probable tiny, early hernia in right upper quadrant abdominal wall  Plan: Since she is minimally symptomatic, I do not advise any intervention for her hernia at this time. Told her to contact me if it becomes larger or more painful Return to see me in one year for annual checkup after she gets her  annual mammograms. She desires to continue to see me annually.    Angelia Mould. Derrell Lolling, M.D., [facility] General and Minimally invasive Surgery Breast and Colorectal Surgery Office:   [phone] Pager:   [phone]

## 2012-05-14 ENCOUNTER — Telehealth: Payer: Self-pay

## 2012-05-14 MED ORDER — METRONIDAZOLE 0.75 % EX CREA: TOPICAL_CREAM | Freq: Two times a day (BID) | CUTANEOUS | Status: DC

## 2012-05-14 MED ORDER — ESTRADIOL 0.1 MG/GM VA CREA: TOPICAL_CREAM | VAGINAL | Status: DC

## 2012-05-14 NOTE — Telephone Encounter (Signed)
Px printed for pick up in IN box  

## 2012-05-14 NOTE — Telephone Encounter (Signed)
Pt left note requesting written rx on Estrace cream and Metronidazole .Please advise. [facility] does not have Premarin vaginal cream and pt request generic form Estrace Cream to be used vaginally three times weekly and the Metronidazole 0.75 % cream; apply to face twice daily for rosacea. Pt request call back when rx ready for pick up. [facility] form given to [employer] (pt has other med to request and wants to pick up form also).Please advise.

## 2012-05-14 NOTE — Telephone Encounter (Signed)
Patient notified that scripts are up front and ready for pickup. 

## 2012-06-26 ENCOUNTER — Other Ambulatory Visit: Payer: Self-pay | Admitting: *Deleted

## 2012-06-26 MED ORDER — AMLODIPINE BESYLATE 10 MG PO TABS: 10.0000 mg | ORAL_TABLET | Freq: Every day | ORAL | Status: DC

## 2012-07-28 ENCOUNTER — Emergency Department (HOSPITAL_COMMUNITY)
Admission: EM | Admit: 2012-07-28 | Discharge: 2012-07-28 | Disposition: A | Discharge status: Home / Self Care | Payer: Medicare Other | Attending: Emergency Medicine | Admitting: Emergency Medicine

## 2012-07-28 ENCOUNTER — Encounter (HOSPITAL_COMMUNITY): Payer: Self-pay | Admitting: Emergency Medicine

## 2012-07-28 DIAGNOSIS — T783XXA Angioneurotic edema, initial encounter: Secondary | ICD-10-CM | POA: Insufficient documentation

## 2012-07-28 DIAGNOSIS — M899 Disorder of bone, unspecified: Secondary | ICD-10-CM | POA: Insufficient documentation

## 2012-07-28 DIAGNOSIS — Z8543 Personal history of malignant neoplasm of ovary: Secondary | ICD-10-CM | POA: Insufficient documentation

## 2012-07-28 DIAGNOSIS — F329 Major depressive disorder, single episode, unspecified: Secondary | ICD-10-CM | POA: Insufficient documentation

## 2012-07-28 DIAGNOSIS — L259 Unspecified contact dermatitis, unspecified cause: Secondary | ICD-10-CM | POA: Insufficient documentation

## 2012-07-28 DIAGNOSIS — T782XXA Anaphylactic shock, unspecified, initial encounter: Secondary | ICD-10-CM

## 2012-07-28 DIAGNOSIS — R42 Dizziness and giddiness: Secondary | ICD-10-CM | POA: Insufficient documentation

## 2012-07-28 DIAGNOSIS — F3289 Other specified depressive episodes: Secondary | ICD-10-CM | POA: Insufficient documentation

## 2012-07-28 DIAGNOSIS — Z8619 Personal history of other infectious and parasitic diseases: Secondary | ICD-10-CM | POA: Insufficient documentation

## 2012-07-28 DIAGNOSIS — Y929 Unspecified place or not applicable: Secondary | ICD-10-CM | POA: Insufficient documentation

## 2012-07-28 DIAGNOSIS — Z79899 Other long term (current) drug therapy: Secondary | ICD-10-CM | POA: Insufficient documentation

## 2012-07-28 DIAGNOSIS — Y93H2 Activity, gardening and landscaping: Secondary | ICD-10-CM | POA: Insufficient documentation

## 2012-07-28 DIAGNOSIS — Z87891 Personal history of nicotine dependence: Secondary | ICD-10-CM | POA: Insufficient documentation

## 2012-07-28 DIAGNOSIS — Z853 Personal history of malignant neoplasm of breast: Secondary | ICD-10-CM | POA: Insufficient documentation

## 2012-07-28 DIAGNOSIS — I10 Essential (primary) hypertension: Secondary | ICD-10-CM | POA: Insufficient documentation

## 2012-07-28 DIAGNOSIS — L509 Urticaria, unspecified: Secondary | ICD-10-CM | POA: Insufficient documentation

## 2012-07-28 DIAGNOSIS — R0602 Shortness of breath: Secondary | ICD-10-CM | POA: Insufficient documentation

## 2012-07-28 DIAGNOSIS — IMO0002 Reserved for concepts with insufficient information to code with codable children: Secondary | ICD-10-CM | POA: Insufficient documentation

## 2012-07-28 DIAGNOSIS — T65891A Toxic effect of other specified substances, accidental (unintentional), initial encounter: Secondary | ICD-10-CM | POA: Insufficient documentation

## 2012-07-28 DIAGNOSIS — Z8719 Personal history of other diseases of the digestive system: Secondary | ICD-10-CM | POA: Insufficient documentation

## 2012-07-28 DIAGNOSIS — L719 Rosacea, unspecified: Secondary | ICD-10-CM | POA: Insufficient documentation

## 2012-07-28 MED ORDER — PREDNISONE 20 MG PO TABS: 40.0000 mg | ORAL_TABLET | Freq: Every day | ORAL | Status: DC

## 2012-07-28 MED ORDER — EPINEPHRINE 0.3 MG/0.3ML IJ SOAJ: 0.3000 mg | INTRAMUSCULAR | Status: DC | PRN

## 2012-07-28 MED ORDER — PREDNISONE 20 MG PO TABS
40.0000 mg | ORAL_TABLET | Freq: Once | ORAL | Status: AC
  Administered 2012-07-28: 40 mg via ORAL
  Filled 2012-07-28: qty 2

## 2012-07-28 NOTE — ED Notes (Signed)
PT. REPORTS ITCHY RASHES / HIVES AT [employer] , ABDOMEN PALMS/FEET THIS EVENING AFTER WORKING AT [employer] THIS AFTERNOON , PT. TOOK 2 BENADRYL TABS PTA WITH SLIGHT RELIEF , AIRWAY INTACT / RESPIRATIONS UNLABORED . SLIGHT TONGUE SWELLING .

## 2012-07-28 NOTE — Discharge Instructions (Signed)
Anaphylactic Reaction °An anaphylactic reaction is a sudden, severe allergic reaction that involves the whole body. It can be life threatening. A hospital stay is often required. People with asthma, eczema, or hay fever are slightly more likely to have an anaphylactic reaction. °CAUSES  °An anaphylactic reaction may be caused by anything to which you are allergic. After being exposed to the allergic substance, your immune system becomes sensitized to it. When you are exposed to that allergic substance again, an allergic reaction can occur. Common causes of an anaphylactic reaction include: °· Medicines. °· Foods, especially peanuts, wheat, shellfish, milk, and eggs. °· Insect bites or stings. °· Blood products. °· Chemicals, such as dyes, latex, and contrast material used for imaging tests. °SYMPTOMS  °When an allergic reaction occurs, the body releases histamine and other substances. These substances cause symptoms such as tightening of the airway. Symptoms often develop within seconds or minutes of exposure. Symptoms may include: °· Skin rash or hives. °· Itching. °· Chest tightness. °· Swelling of the eyes, tongue, or lips. °· Trouble breathing or swallowing. °· Lightheadedness or fainting. °· Anxiety or confusion. °· Stomach pains, vomiting, or diarrhea. °· Nasal congestion. °· A fast or irregular heartbeat (palpitations). °DIAGNOSIS  °Diagnosis is based on your history of recent exposure to allergic substances, your symptoms, and a physical exam. Your caregiver may also perform blood or urine tests to confirm the diagnosis. °TREATMENT  °Epinephrine medicine is the main treatment for an anaphylactic reaction. Other medicines that may be used for treatment include antihistamines, steroids, and albuterol. In severe cases, fluids and medicine to support blood pressure may be given through an intravenous line (IV). Even if you improve after treatment, you need to be observed to make sure your condition does not get  worse. This may require a stay in the hospital. °HOME CARE INSTRUCTIONS  °· Wear a medical alert bracelet or necklace stating your allergy. °· You and your family must learn how to use an anaphylaxis kit or give an epinephrine injection to temporarily treat an emergency allergic reaction. Always carry your epinephrine injection or anaphylaxis kit with you. This can be lifesaving if you have a severe reaction. °· Do not drive or perform tasks after treatment until the medicines used to treat your reaction have worn off, or until your caregiver says it is okay. °· If you have hives or a rash: °· Take medicines as directed by your caregiver. °· You may use an over-the-counter antihistamine (diphenhydramine) as needed. °· Apply cold compresses to the skin or take baths in cool water. Avoid hot baths or showers. °SEEK MEDICAL CARE IF:  °· You develop symptoms of an allergic reaction to a new substance. Symptoms may start right away or minutes later. °· You develop a rash, hives, or itching. °· You develop new symptoms. °SEEK IMMEDIATE MEDICAL CARE IF:  °· You have swelling of the mouth, difficulty breathing, or wheezing. °· You have a tight feeling in your chest or throat. °· You develop hives, swelling, or itching all over your body. °· You develop severe vomiting or diarrhea. °· You feel faint or pass out. °This is an emergency. Use your epinephrine injection or anaphylaxis kit as you have been instructed. Call your local emergency services (911 in [place] Even if you improve after the injection, you need to be examined at a hospital emergency department. °MAKE SURE YOU:  °· Understand these instructions. °· Will watch your condition. °· Will get help right away if you are not   doing well or get worse. °Document Released: 12/20/2004 Document Revised: 06/21/2011 Document Reviewed: 03/23/2011 °Exit[employer]® Patient Information ©2014 [employer]. ° °

## 2012-07-28 NOTE — ED Notes (Signed)
Pt reports being stung on right arm while gardening. Pt then started to experience itching to palms of hands/feet, SOB, and diaphoresis. Pt took a cold bath and benadryl which relieved syx. Bite noted, but lung sounds clear, NAD. Md Ghim at bedside.

## 2012-07-28 NOTE — ED Provider Notes (Signed)
CSN: [id number]     Arrival date & time 07/28/12  2036 History     First MD Initiated Contact with Patient 07/28/12 2222     Chief Complaint  Patient presents with  . Urticaria   (Consider location/radiation/quality/duration/timing/severity/associated sxs/prior Treatment) Patient is a 72 y.o. female presenting with allergic reaction. The history is provided by the patient and the spouse.  Allergic Reaction Presenting symptoms: difficulty breathing, itching and rash   Presenting symptoms: no difficulty swallowing and no wheezing   Difficulty breathing:    Severity:  Moderate   Onset quality:  Gradual   Duration:  5 hours   Progression:  Resolved Itching:    Location:  Arm, chest, palms and leg   Severity:  Moderate   Onset quality:  Gradual   Duration:  5 hours   Progression:  Improving Rash:    Location:  Buttocks, chest, groin, leg, arm and hand   Quality: itchiness and redness     Severity:  Moderate   Onset quality:  Gradual   Duration:  5 hours   Progression:  Resolved Severity:  Severe Prior allergic episodes:  No prior episodes Context: insect bite/sting   Relieved by:  Antihistamines   Past Medical History  Diagnosis Date  . Depression   . Osteopenia   . Eczema   . Hypertension   . Rosacea   . Zoster 02/09  . 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 Adenomatous polyp of colon   . 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 CA - cancer of ovary    Past Surgical History  Procedure Laterality Date  . Cholecystectomy  08/1996    10 yrs ago  . Abdominal hysterectomy  1996    BSO fibroids, incidental ovarian CA finding  . Breast surgery      rt total mastectomy for breast CA  . Lipoma removal  11/07  . Bladder tack    . Colonoscopy     Family History  Problem Relation Age of Onset  . Cancer Mother     stomach  . Hypertension Mother   . Heart disease Mother     CAD  . Stomach cancer Mother   . Cancer Father      lung  . Cancer Sister     colon, skin  . Heart disease Sister 82    MI  . Diabetes Sister   . Colon cancer Sister   . Heart disease Brother     HTN and MI  . Hypertension Brother   . Esophageal cancer Neg Hx    History  Substance Use Topics  . Smoking status: Former Smoker    Quit date: 01/03/1990  . Smokeless tobacco: Never Used  . Alcohol Use: 1.0 oz/week    2 drink(s) per week     Comment: social   OB History   Grav Para Term Preterm Abortions TAB SAB Ect Mult Living                 Review of Systems  HENT: Negative for sore throat, trouble swallowing and voice change.   Respiratory: Positive for shortness of breath. Negative for chest tightness and wheezing.   Cardiovascular: Negative for chest pain.  Gastrointestinal: Negative for nausea, vomiting and diarrhea.  Musculoskeletal: Negative for myalgias and arthralgias.  Skin: Positive for itching and rash.  Neurological: Positive for light-headedness. Negative for syncope.  All  other systems reviewed and are negative.    Allergies  Review of patient's allergies indicates no known allergies.  Home Medications   Current Outpatient Rx  Name  Route  Sig  Dispense  Refill  . amLODipine (NORVASC) 10 MG tablet   Oral   Take 1 tablet (10 mg total) by mouth daily.   30 tablet   0     NEEDS TO BE SEEN FOR FURTHER REFILLS   . BIOTIN PO   Oral   Take 1 tablet by mouth every evening.          . Calcium Carbonate-Vitamin D (CALTRATE 600+D PO)   Oral   Take 1 tablet by mouth every evening.         Marland Kitchen estradiol (ESTRACE VAGINAL) 0.1 MG/GM vaginal cream      Insert 1/4 applicatorful of cream vaginally three times per week   42.5 g   3   . FLUoxetine (PROZAC) 20 MG capsule   Oral   Take 20 mg by mouth daily.         Marland Kitchen MAGNESIUM PO   Oral   Take 1 tablet by mouth daily as needed (only takes when working out, for muscle cramps).         . metroNIDAZOLE (METROCREAM) 0.75 % cream   Topical   Apply  topically 2 (two) times daily.   60 g   3   . Multiple Vitamin (MULTIVITAMIN) tablet   Oral   Take 1 tablet by mouth daily.           . naproxen sodium (ANAPROX) 220 MG tablet   Oral   Take 220 mg by mouth daily as needed (for arthritis).         Marland Kitchen oxyCODONE-acetaminophen (PERCOCET) 7.5-325 MG per tablet   Oral   Take 1 tablet by mouth every 4 (four) hours as needed for pain.         Marland Kitchen EPINEPHrine (EPIPEN) 0.3 mg/0.3 mL SOAJ   Intramuscular   Inject 0.3 mLs (0.3 mg total) into the muscle as needed.   1 Device   0   . predniSONE (DELTASONE) 20 MG tablet   Oral   Take 2 tablets (40 mg total) by mouth daily.   12 tablet   0    BP 167/60  Pulse 66  Temp(Src) 98.1 F (36.7 C) (Oral)  Resp 16  SpO2 94% Physical Exam  Nursing note and vitals reviewed. Constitutional: She is oriented to person, place, and time. She appears well-developed and well-nourished. No distress.  HENT:  Head: Normocephalic and atraumatic.  Right Ear: External ear normal.  Left Ear: External ear normal.  Mouth/Throat: Uvula is midline, oropharynx is clear and moist and mucous membranes are normal.  Eyes: EOM are normal.  Neck: Normal range of motion. Neck supple.  Cardiovascular: Normal rate, regular rhythm and intact distal pulses.   Pulmonary/Chest: Effort normal. No respiratory distress. She has no wheezes. She has no rales.  Abdominal: Soft. There is no tenderness. There is no rebound and no guarding.  Neurological: She is alert and oriented to person, place, and time.  Skin: Skin is warm and dry. Rash noted. Rash is urticarial. She is not diaphoretic.    ED Course   Procedures (including critical care time)  Labs Reviewed - No data to display No results found. 1. Anaphylaxis, initial encounter      RA sat is 94% and I interpret to be adequate  MDM  Pt with  history concerning for anaphylaxis or near anaphylaxis.  No prior h/o allergic reaction.  She felt a bite or sting on her  right arm just prior to symptoms.  Pt took benadryl 45 minutes after symptoms and feel nearly resolved now.  Pt will need epi pen in case of future exposure, can see allergist in follow up in 2 weeks.  Gavin Pound. Anam Bobby, MD 07/28/12 2300

## 2012-08-03 ENCOUNTER — Encounter: Payer: Self-pay | Admitting: Family Medicine

## 2012-08-03 ENCOUNTER — Ambulatory Visit (INDEPENDENT_AMBULATORY_CARE_PROVIDER_SITE_OTHER): Payer: Medicare Other | Admitting: Family Medicine

## 2012-08-03 VITALS — BP 112/82 | HR 50 | Temp 97.6°F | Wt 131.2 lb

## 2012-08-03 DIAGNOSIS — Z8619 Personal history of other infectious and parasitic diseases: Secondary | ICD-10-CM

## 2012-08-03 DIAGNOSIS — I10 Essential (primary) hypertension: Secondary | ICD-10-CM

## 2012-08-03 DIAGNOSIS — Z23 Encounter for immunization: Secondary | ICD-10-CM

## 2012-08-03 DIAGNOSIS — M899 Disorder of bone, unspecified: Secondary | ICD-10-CM

## 2012-08-03 DIAGNOSIS — M949 Disorder of cartilage, unspecified: Secondary | ICD-10-CM

## 2012-08-03 LAB — LIPID PANEL
Cholesterol: 227 mg/dL — ABNORMAL HIGH (ref 0–200)
HDL: 46.7 mg/dL
Total CHOL/HDL Ratio: 5
Triglycerides: 217 mg/dL — ABNORMAL HIGH (ref 0.0–149.0)
VLDL: 43.4 mg/dL — ABNORMAL HIGH (ref 0.0–40.0)

## 2012-08-03 LAB — CBC WITH DIFFERENTIAL/PLATELET
Basophils Absolute: 0 10*3/uL (ref 0.0–0.1)
Basophils Relative: 0.4 % (ref 0.0–3.0)
Eosinophils Absolute: 0.2 10*3/uL (ref 0.0–0.7)
Eosinophils Relative: 1.7 % (ref 0.0–5.0)
HCT: 36.2 % (ref 36.0–46.0)
Hemoglobin: 12.1 g/dL (ref 12.0–15.0)
Lymphocytes Relative: 20.2 % (ref 12.0–46.0)
Lymphs Abs: 2.2 10*3/uL (ref 0.7–4.0)
MCHC: 33.4 g/dL (ref 30.0–36.0)
MCV: 81.7 fl (ref 78.0–100.0)
Monocytes Absolute: 0.5 10*3/uL (ref 0.1–1.0)
Monocytes Relative: 4.3 % (ref 3.0–12.0)
Neutro Abs: 8.1 10*3/uL — ABNORMAL HIGH (ref 1.4–7.7)
Neutrophils Relative %: 73.4 % (ref 43.0–77.0)
Platelets: 277 10*3/uL (ref 150.0–400.0)
RBC: 4.43 Mil/uL (ref 3.87–5.11)
RDW: 13.6 % (ref 11.5–14.6)
WBC: 11.1 10*3/uL — ABNORMAL HIGH (ref 4.5–10.5)

## 2012-08-03 LAB — COMPREHENSIVE METABOLIC PANEL WITH GFR
ALT: 21 U/L (ref 0–35)
AST: 28 U/L (ref 0–37)
Albumin: 4.2 g/dL (ref 3.5–5.2)
Alkaline Phosphatase: 63 U/L (ref 39–117)
BUN: 30 mg/dL — ABNORMAL HIGH (ref 6–23)
CO2: 26 meq/L (ref 19–32)
Calcium: 9.5 mg/dL (ref 8.4–10.5)
Chloride: 106 meq/L (ref 96–112)
Creatinine, Ser: 1.2 mg/dL (ref 0.4–1.2)
GFR: 47.69 mL/min — ABNORMAL LOW
Glucose, Bld: 86 mg/dL (ref 70–99)
Potassium: 4.6 meq/L (ref 3.5–5.1)
Sodium: 138 meq/L (ref 135–145)
Total Bilirubin: 0.5 mg/dL (ref 0.3–1.2)
Total Protein: 7.5 g/dL (ref 6.0–8.3)

## 2012-08-03 LAB — LDL CHOLESTEROL, DIRECT: Direct LDL: 135.6 mg/dL

## 2012-08-03 LAB — TSH: TSH: 0.64 u[IU]/mL (ref 0.35–5.50)

## 2012-08-03 MED ORDER — AMLODIPINE BESYLATE 10 MG PO TABS: 10.0000 mg | ORAL_TABLET | Freq: Every day | ORAL | Status: DC

## 2012-08-03 NOTE — Patient Instructions (Addendum)
Labs today  Try taking your amlodipine at night to see if that helps your energy level  If you are interested in a shingles/zoster vaccine - call your insurance to check on coverage,( you should not get it within 1 month of other vaccines) , then call us for a prescription  for it to take to a pharmacy that gives the shot , or make a nurse visit to get it here depending on your coverage  Pneumonia vaccine today

## 2012-08-03 NOTE — Progress Notes (Signed)
Subjective:    Patient ID: Pamela Morales, female    DOB: Mar 04, 1940, 72 y.o.   MRN: [id number]  HPI Here for f/u of chronic medical problems   Feels good in general  She is "draggy" in the mornings  She does urinate several times at night  May snore a bit  She goes to bed at 9 and then reads to 11-12  She may try taking her bp med at night   Mood - is usually chipper , she stays motivated and goes to the gym  She works outdoors a lot - garden and a big yard  She continues to take her prozac - it works the same   [employer] is stable - she eats quite healthy especially with a garden Avoids fried food   bp is stable today  No cp or palpitations or headaches or edema  No side effects to medicines  BP Readings from Last 3 Encounters:  08/03/12 112/82  07/28/12 167/60  04/27/12 102/62     Had a bad bite or sting-went to the ER  Hx of osteopenia Last dexa 10/13 - worse at femoral neck [place]- none  fx-none  Calcium and D- she is good about that   imms: Zoster- had shingles but not the vaccine  Pneumovax -- has never had - will do that today  Patient Active Problem List   Diagnosis Date Noted  . Neck muscle spasm 10/01/2011  . 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 Thrush 01/07/2011  . Sleep disorder 01/07/2011  . Colon polyps 07/16/2010  . FOOT PAIN, BILATERAL 05/25/2009  . BACK PAIN WITH RADICULOPATHY 03/15/2007  . HERPES ZOSTER 02/23/2007  . DEPRESSION 02/05/2007  . ESSENTIAL HYPERTENSION 02/05/2007  . OSTEOPENIA 02/05/2007  . ECZEMA, ATOPIC 01/29/2007  . ROSACEA 01/29/2007  . BASAL CELL CARCINOMA, HX OF 01/29/2007   Past Medical History  Diagnosis Date  . Depression   . Osteopenia   . Eczema   . Hypertension   . Rosacea   . Zoster 02/09  . 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 Adenomatous polyp of colon   . 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 CA - cancer of ovary    Past Surgical History   Procedure Laterality Date  . Cholecystectomy  08/1996    10 yrs ago  . Abdominal hysterectomy  1996    BSO fibroids, incidental ovarian CA finding  . Breast surgery      rt total mastectomy for breast CA  . Lipoma removal  11/07  . Bladder tack    . Colonoscopy     History  Substance Use Topics  . Smoking status: Former Smoker    Quit date: 01/03/1990  . Smokeless tobacco: Never Used  . Alcohol Use: 1.0 oz/week    2 drink(s) per week     Comment: social   Family History  Problem Relation Age of Onset  . Cancer Mother     stomach  . Hypertension Mother   . Heart disease Mother     CAD  . Stomach cancer Mother   . Cancer Father     lung  . Cancer Sister     colon, skin  . Heart disease Sister 55    MI  . Diabetes Sister   . Colon cancer Sister   . Heart disease Brother     HTN and  MI  . Hypertension Brother   . Esophageal cancer Neg Hx    No Known Allergies Current Outpatient Prescriptions on File Prior to Visit  Medication Sig Dispense Refill  . BIOTIN PO Take 1 tablet by mouth every evening.       . Calcium Carbonate-Vitamin D (CALTRATE 600+D PO) Take 1 tablet by mouth every evening.      Marland Kitchen EPINEPHrine (EPIPEN) 0.3 mg/0.3 mL SOAJ Inject 0.3 mLs (0.3 mg total) into the muscle as needed.  1 Device  0  . estradiol (ESTRACE VAGINAL) 0.1 MG/GM vaginal cream Insert 1/4 applicatorful of cream vaginally three times per week  42.5 g  3  . FLUoxetine (PROZAC) 20 MG capsule Take 20 mg by mouth daily.      Marland Kitchen MAGNESIUM PO Take 1 tablet by mouth daily as needed (only takes when working out, for muscle cramps).      . metroNIDAZOLE (METROCREAM) 0.75 % cream Apply topically 2 (two) times daily.  60 g  3  . Multiple Vitamin (MULTIVITAMIN) tablet Take 1 tablet by mouth daily.        . naproxen sodium (ANAPROX) 220 MG tablet Take 220 mg by mouth daily as needed (for arthritis).      Marland Kitchen oxyCODONE-acetaminophen (PERCOCET) 7.5-325 MG per tablet Take 1 tablet by mouth every 4 (four)  hours as needed for pain.       No current facility-administered medications on file prior to visit.    Review of Systems Review of Systems  Constitutional: Negative for fever, appetite change, fatigue and unexpected weight change.  Eyes: Negative for pain and visual disturbance.  Respiratory: Negative for cough and shortness of breath.   Cardiovascular: Negative for cp or palpitations    Gastrointestinal: Negative for nausea, diarrhea and constipation.  Genitourinary: Negative for urgency and frequency.  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  Hematological: Negative for adenopathy. Does not bruise/bleed easily.  Psychiatric/Behavioral: Negative for dysphoric mood. The patient is not nervous/anxious.         Objective:   Physical Exam  Constitutional: She appears well-developed and well-nourished. No distress.  HENT:  Head: Normocephalic and atraumatic.  Right Ear: External ear normal.  Left Ear: External ear normal.  Nose: Nose normal.  Mouth/Throat: Oropharynx is clear and moist.  Eyes: Conjunctivae and EOM are normal. Pupils are equal, round, and reactive to light. Right eye exhibits no discharge. Left eye exhibits no discharge. No scleral icterus.  Neck: Normal range of motion. Neck supple. No JVD present. No thyromegaly present.  Cardiovascular: Normal rate, regular rhythm, normal heart sounds and intact distal pulses.  Exam reveals no gallop.   Pulmonary/Chest: Effort normal and breath sounds normal. No respiratory distress. She has no wheezes. She has no rales.  Abdominal: Soft. Bowel sounds are normal. She exhibits no distension, no abdominal bruit and no mass. There is no tenderness.  Musculoskeletal: She exhibits no edema and no tenderness.  Lymphadenopathy:    She has no cervical adenopathy.  Neurological: She  is alert. She has normal reflexes. No cranial nerve deficit. She exhibits normal muscle tone. Coordination normal.  Skin: Skin is warm and dry. No rash noted. No erythema. No pallor.  Psychiatric: She has a normal mood and affect.          Assessment & Plan:

## 2012-08-04 LAB — VITAMIN D 25 HYDROXY (VIT D DEFICIENCY, FRACTURES): Vit D, 25-Hydroxy: 51 ng/mL (ref 30–89)

## 2012-08-05 NOTE — Assessment & Plan Note (Signed)
Disc need for zoster vaccine if affordable inst to check with her insurance to see if it is covered

## 2012-08-05 NOTE — Assessment & Plan Note (Signed)
D level today No falls or fractures  Stays active and goes to the gym- commended on that

## 2012-08-05 NOTE — Assessment & Plan Note (Signed)
bp in fair control at this time  No changes needed  Disc lifstyle change with low sodium diet and exercise  Lab today 

## 2012-08-20 ENCOUNTER — Other Ambulatory Visit (INDEPENDENT_AMBULATORY_CARE_PROVIDER_SITE_OTHER): Payer: Medicare Other

## 2012-08-20 DIAGNOSIS — D72829 Elevated white blood cell count, unspecified: Secondary | ICD-10-CM

## 2012-08-20 LAB — CBC WITH DIFFERENTIAL/PLATELET
Basophils Absolute: 0 10*3/uL (ref 0.0–0.1)
Basophils Relative: 0.4 % (ref 0.0–3.0)
Eosinophils Absolute: 0.1 10*3/uL (ref 0.0–0.7)
HCT: 34.5 % — ABNORMAL LOW (ref 36.0–46.0)
Hemoglobin: 11.6 g/dL — ABNORMAL LOW (ref 12.0–15.0)
Lymphs Abs: 2.6 10*3/uL (ref 0.7–4.0)
MCHC: 33.5 g/dL (ref 30.0–36.0)
MCV: 82.7 fl (ref 78.0–100.0)
Monocytes Absolute: 0.6 10*3/uL (ref 0.1–1.0)
Neutro Abs: 6.6 10*3/uL (ref 1.4–7.7)
RBC: 4.17 Mil/uL (ref 3.87–5.11)
RDW: 13.4 % (ref 11.5–14.6)

## 2012-08-21 ENCOUNTER — Encounter: Payer: Self-pay | Admitting: *Deleted

## 2012-09-19 ENCOUNTER — Encounter: Payer: Self-pay | Admitting: Family Medicine

## 2012-09-19 ENCOUNTER — Ambulatory Visit (INDEPENDENT_AMBULATORY_CARE_PROVIDER_SITE_OTHER): Payer: Medicare Other | Admitting: Family Medicine

## 2012-09-19 VITALS — BP 134/74 | HR 50 | Temp 98.3°F | Ht 60.0 in | Wt 134.5 lb

## 2012-09-19 DIAGNOSIS — Z23 Encounter for immunization: Secondary | ICD-10-CM

## 2012-09-19 DIAGNOSIS — H9193 Unspecified hearing loss, bilateral: Secondary | ICD-10-CM

## 2012-09-19 DIAGNOSIS — J358 Other chronic diseases of tonsils and adenoids: Secondary | ICD-10-CM | POA: Insufficient documentation

## 2012-09-19 DIAGNOSIS — H9190 Unspecified hearing loss, unspecified ear: Secondary | ICD-10-CM | POA: Insufficient documentation

## 2012-09-19 MED ORDER — ZOSTER VACCINE LIVE 19400 UNT/0.65ML ~~LOC~~ SOLR: 0.6500 mL | Freq: Once | SUBCUTANEOUS | Status: DC

## 2012-09-19 NOTE — Patient Instructions (Signed)
Flu vaccine today  Here is a px to get your shingles vaccine at [facility] in a month  Your tonsillith appears to be gone Gargle after each meal to prevent these and if you have more problems let me know  We will refer you to ENT for hearing eval at check out

## 2012-09-19 NOTE — Progress Notes (Signed)
Subjective:    Patient ID: Pamela Morales, female    DOB: 04-May-1940, 72 y.o.   MRN: [id number]  HPI Here to check a place in mouth  [facility]  Look inflammed - and could not see far back enough  It does hurt  May be getting bigger  Dentist noted:  R tonsil soft tissue white mass 2 by 2 mm with suspected  inflammation surrouding  ? Tonsillolith  No sore throat or fever   Found out [facility]  Is getting more hearing loss with time  Interested in ENT ref for eval/ audiology  Wants flu shot today  Patient Active Problem List   Diagnosis Date Noted  . 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 Sleep disorder 01/07/2011  . Colon polyps 07/16/2010  . BACK PAIN WITH RADICULOPATHY 03/15/2007  . History of herpes zoster 02/23/2007  . ESSENTIAL HYPERTENSION 02/05/2007  . OSTEOPENIA 02/05/2007  . ECZEMA, ATOPIC 01/29/2007  . ROSACEA 01/29/2007  . BASAL CELL CARCINOMA, HX OF 01/29/2007   Past Medical History  Diagnosis Date  . Depression   . Osteopenia   . Eczema   . Hypertension   . Rosacea   . Zoster 02/09  . 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 Adenomatous polyp of colon   . 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 CA - cancer of ovary    Past Surgical History  Procedure Laterality Date  . Cholecystectomy  08/1996    10 yrs ago  . Abdominal hysterectomy  1996    BSO fibroids, incidental ovarian CA finding  . Breast surgery      rt total mastectomy for breast CA  . Lipoma removal  11/07  . Bladder tack    . Colonoscopy     History  Substance Use Topics  . Smoking status: Former Smoker    Quit date: 01/03/1990  . Smokeless tobacco: Never Used  . Alcohol Use: 1.0 oz/week    2 drink(s) per week     Comment: social   Family History  Problem Relation Age of Onset  . Cancer Mother     stomach  . Hypertension Mother   . Heart disease  Mother     CAD  . Stomach cancer Mother   . Cancer Father     lung  . Cancer Sister     colon, skin  . Heart disease Sister 72    MI  . Diabetes Sister   . Colon cancer Sister   . Heart disease Brother     HTN and MI  . Hypertension Brother   . Esophageal cancer Neg Hx    No Known Allergies Current Outpatient Prescriptions on File Prior to Visit  Medication Sig Dispense Refill  . amLODipine (NORVASC) 10 MG tablet Take 1 tablet (10 mg total) by mouth daily.  90 tablet  3  . BIOTIN PO Take 1 tablet by mouth every evening.       . Calcium Carbonate-Vitamin D (CALTRATE 600+D PO) Take 1 tablet by mouth every evening.      Marland Kitchen EPINEPHrine (EPIPEN) 0.3 mg/0.3 mL SOAJ Inject 0.3 mLs (0.3 mg total) into the muscle as needed.  1 Device  0  . estradiol (ESTRACE VAGINAL) 0.1 MG/GM vaginal cream Insert 1/4 applicatorful of cream vaginally three times  per week  42.5 g  3  . FLUoxetine (PROZAC) 20 MG capsule Take 20 mg by mouth daily.      Marland Kitchen MAGNESIUM PO Take 1 tablet by mouth daily as needed (only takes when working out, for muscle cramps).      . metroNIDAZOLE (METROCREAM) 0.75 % cream Apply topically 2 (two) times daily.  60 g  3  . Multiple Vitamin (MULTIVITAMIN) tablet Take 1 tablet by mouth daily.        . naproxen sodium (ANAPROX) 220 MG tablet Take 220 mg by mouth daily as needed (for arthritis).      Marland Kitchen oxyCODONE-acetaminophen (PERCOCET) 7.5-325 MG per tablet Take 1 tablet by mouth every 4 (four) hours as needed for pain.       No current facility-administered medications on file prior to visit.      Review of Systems Review of Systems  Constitutional: Negative for fever, appetite change, fatigue and unexpected weight change.  ENT neg for mouth or throat pain/ neg for congestion or sinus pain Eyes: Negative for pain and visual disturbance.  Respiratory: Negative for cough and shortness of breath.   Cardiovascular: Negative for cp or palpitations    Gastrointestinal: Negative for  nausea, diarrhea and constipation.  Genitourinary: Negative for urgency and frequency.  Skin: Negative for pallor or rash   Neurological: Negative for weakness, light-headedness, numbness and headaches.  Hematological: Negative for adenopathy. Does not bruise/bleed easily.  Psychiatric/Behavioral: Negative for dysphoric mood. The patient is not nervous/anxious.         Objective:   Physical Exam  Constitutional: She appears well-developed and well-nourished. No distress.  HENT:  Head: Normocephalic and atraumatic.  Right Ear: External ear normal.  Left Ear: External ear normal.  Nose: Nose normal.  Mouth/Throat: Oropharynx is clear and moist.  Some crypts noted in tonsils that are empty-no tonsillith seen  (pt then looked in mirror and noted that area was now clear) No erythema  Eyes: Conjunctivae and EOM are normal. Pupils are equal, round, and reactive to light. No scleral icterus.  Neck: Normal range of motion. Neck supple.  Cardiovascular: Normal rate and regular rhythm.   Pulmonary/Chest: Effort normal and breath sounds normal.  Lymphadenopathy:    She has no cervical adenopathy.  Neurological: She is alert.  Skin: Skin is warm and dry. No rash noted.  Psychiatric: She has a normal mood and affect.          Assessment & Plan:

## 2012-09-20 NOTE — Assessment & Plan Note (Signed)
Ref to ENT for hearing eval at pt's request

## 2012-09-20 NOTE — Assessment & Plan Note (Signed)
Now resolved Adv gargling after meals with salt water and update if this problem becomes worse

## 2012-09-26 ENCOUNTER — Other Ambulatory Visit: Payer: Self-pay

## 2012-09-26 MED ORDER — ESTRADIOL 0.1 MG/GM VA CREA: TOPICAL_CREAM | VAGINAL | Status: DC

## 2012-09-26 NOTE — Telephone Encounter (Signed)
No problem Px sent  I divided amt up into 1/3 of what she was getting -if that does not work out please let me know

## 2012-09-26 NOTE — Telephone Encounter (Signed)
Pt left v/m requesting one month supply with refills of estrace vaginal cream sent to [facility]; mail order pharmacy charges her 3 copays each time pt receives med and pt only wants one co pay per month.pt request cb when done.

## 2012-09-26 NOTE — Telephone Encounter (Signed)
Left message at home number to call back. 

## 2012-09-27 NOTE — Telephone Encounter (Signed)
Pt's husband advised Rx sent to pharmacy, pt's spouse advised me that pt has already picked up Rx

## 2012-10-19 ENCOUNTER — Ambulatory Visit (INDEPENDENT_AMBULATORY_CARE_PROVIDER_SITE_OTHER)
Admission: RE | Admit: 2012-10-19 | Discharge: 2012-10-19 | Disposition: A | Discharge status: Home / Self Care | Payer: Medicare Other | Source: Ambulatory Visit | Attending: Family Medicine | Admitting: Family Medicine

## 2012-10-19 ENCOUNTER — Ambulatory Visit (INDEPENDENT_AMBULATORY_CARE_PROVIDER_SITE_OTHER): Payer: Medicare Other | Admitting: Family Medicine

## 2012-10-19 ENCOUNTER — Encounter: Payer: Self-pay | Admitting: Family Medicine

## 2012-10-19 ENCOUNTER — Telehealth (INDEPENDENT_AMBULATORY_CARE_PROVIDER_SITE_OTHER): Payer: Medicare Other | Admitting: Family Medicine

## 2012-10-19 VITALS — BP 120/56 | HR 52 | Temp 98.3°F | Ht 60.0 in | Wt 132.0 lb

## 2012-10-19 DIAGNOSIS — M25539 Pain in unspecified wrist: Secondary | ICD-10-CM

## 2012-10-19 DIAGNOSIS — M25569 Pain in unspecified knee: Secondary | ICD-10-CM

## 2012-10-19 DIAGNOSIS — M25562 Pain in left knee: Secondary | ICD-10-CM | POA: Insufficient documentation

## 2012-10-19 DIAGNOSIS — M25531 Pain in right wrist: Secondary | ICD-10-CM

## 2012-10-19 MED ORDER — MELOXICAM 15 MG PO TABS: 15.0000 mg | ORAL_TABLET | Freq: Every day | ORAL | Status: DC

## 2012-10-19 NOTE — Assessment & Plan Note (Signed)
Placed pt in sling, send for X-ray. Start ice, elevation and meloxicam for pain and swelling.

## 2012-10-19 NOTE — Assessment & Plan Note (Signed)
?   Secondary to arthritis flare, exam nml. Treat with NSAIDS.

## 2012-10-19 NOTE — Progress Notes (Signed)
  Subjective:    Patient ID: Pamela Morales, female    DOB: 1940/08/12, 72 y.o.   MRN: [id number]  HPI  72 year old female pt of Dr. Milinda Morales she has history of osteopenia  presents with  new onset 4 days of  Right wrist pain.  Picked up gallon milk, gradually after felt pain in right wrist and hand. Did not notice a pop.   Has redness swelling, cannot turn wrist. She has to cradle arm due to soreness. She has been feeling tired.  Yesterday she noted soreness on left lateral knee , no redness no swelling. Gradually getting better.   Started after recent trip from [place]. Was on prolonged flight. Has not exercised in the las 1-2 weeks.  No history of gout.  She has used percocet for her back ... She has taken this and aleve for wrist.  no ice.  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    Review of Systems  Constitutional: Negative for fever and fatigue.  HENT: Negative for ear pain.   Eyes: Negative for pain.  Respiratory: Negative for chest tightness and shortness of breath.   Cardiovascular: Negative for chest pain, palpitations and leg swelling.  Gastrointestinal: Negative for abdominal pain.  Genitourinary: Negative for dysuria.       Objective:   Physical Exam  Constitutional: She is oriented to person, place, and time. She appears well-developed.  Neck: Normal range of motion. Neck supple. No thyromegaly present.  Cardiovascular: Normal rate.   Pulmonary/Chest: Effort normal and breath sounds normal. No respiratory distress. She has no wheezes. She has no rales. She exhibits no tenderness.  Musculoskeletal:       Right wrist: She exhibits decreased range of motion, tenderness, bony tenderness and swelling.       Left knee: She exhibits normal range of motion, no swelling, no erythema, normal meniscus and no MCL laxity. Tenderness found. Lateral joint line tenderness noted. No MCL, no LCL and no patellar tendon tenderness noted.       Left hand: She exhibits  decreased range of motion, tenderness, bony tenderness and swelling. She exhibits normal capillary refill. Normal sensation noted. Normal strength noted.  Neurological: She is alert and oriented to person, place, and time. No cranial nerve deficit. Coordination normal.          Assessment & Plan:

## 2012-10-19 NOTE — Patient Instructions (Signed)
Start meloxicam for pain and inflammation. Wear arm in sling until we call with X-ray results.  Elevate wrist, ice.

## 2012-10-19 NOTE — Telephone Encounter (Signed)
Message copied by Excell Seltzer on Fri Oct 19, 2012  5:29 PM ------      Message from: Damita Lack      Created: Fri Oct 19, 2012  4:35 PM       Patient notified as instructed by telephone. Scheduled for Monday 10/23/2012 @ 12pm for labs. ------

## 2012-10-22 ENCOUNTER — Other Ambulatory Visit (INDEPENDENT_AMBULATORY_CARE_PROVIDER_SITE_OTHER): Payer: Medicare Other

## 2012-10-22 ENCOUNTER — Telehealth: Payer: Self-pay | Admitting: Radiology

## 2012-10-22 DIAGNOSIS — M25539 Pain in unspecified wrist: Secondary | ICD-10-CM

## 2012-10-22 DIAGNOSIS — M899 Disorder of bone, unspecified: Secondary | ICD-10-CM

## 2012-10-22 DIAGNOSIS — M25531 Pain in right wrist: Secondary | ICD-10-CM

## 2012-10-22 MED ORDER — VALACYCLOVIR HCL 1 G PO TABS: 1000.0000 mg | ORAL_TABLET | Freq: Three times a day (TID) | ORAL | Status: DC

## 2012-10-22 NOTE — Telephone Encounter (Signed)
Spoke with Dr Milinda Antis explaining the pain that  the patient described to me,

## 2012-10-22 NOTE — Telephone Encounter (Signed)
Discussed pt's sxs with Terri (lab tech), please advise what to do

## 2012-10-22 NOTE — Telephone Encounter (Signed)
Patient saw Dr Ermalene Searing Friday. She came back for labs today. Patient is in extreme pain, found a blister on her right shoulder today. Thinks it may be shingles , again. I told her she would probably need to schedule with you. She asked if you could order test to confirm if it is Shingles. I did draw extra blood.

## 2012-10-22 NOTE — Telephone Encounter (Signed)
Pt notified Rx sent to pharmacy and f/u appt scheduled for tomorrow 10/23/12

## 2012-10-22 NOTE — Telephone Encounter (Signed)
I want to go ahead and call in valtrex on the off chance this is shingles  Go ahead and start it - follow up this week so we can take a look at the rash

## 2012-10-22 NOTE — Telephone Encounter (Signed)
Is the pain in the area of the blister? Where is her primary pain ? -- we dx shingles clinically- from exam/ symptoms

## 2012-10-23 ENCOUNTER — Encounter: Payer: Self-pay | Admitting: Family Medicine

## 2012-10-23 ENCOUNTER — Ambulatory Visit (INDEPENDENT_AMBULATORY_CARE_PROVIDER_SITE_OTHER): Payer: Medicare Other | Admitting: Family Medicine

## 2012-10-23 VITALS — BP 122/66 | HR 48 | Temp 97.6°F | Ht 60.0 in | Wt 129.5 lb

## 2012-10-23 DIAGNOSIS — M25539 Pain in unspecified wrist: Secondary | ICD-10-CM

## 2012-10-23 DIAGNOSIS — M25531 Pain in right wrist: Secondary | ICD-10-CM

## 2012-10-23 DIAGNOSIS — M25431 Effusion, right wrist: Secondary | ICD-10-CM

## 2012-10-23 DIAGNOSIS — M25439 Effusion, unspecified wrist: Secondary | ICD-10-CM

## 2012-10-23 LAB — CYCLIC CITRUL PEPTIDE ANTIBODY, IGG: Cyclic Citrullin Peptide Ab: <2 U/mL (ref 0.0–5.0)

## 2012-10-23 NOTE — Patient Instructions (Signed)
Continue percocet as needed and meloxicam daily with food  Ice/ heat if helpful  Elevate if needed  We will refer you to rheumatology- will call you after 2 pm

## 2012-10-23 NOTE — Assessment & Plan Note (Signed)
See assessment for wrist swelling - with erosive changes on xray Lab rev Ref to rheum Do not think the papule pt found on chest is shingles

## 2012-10-23 NOTE — Assessment & Plan Note (Signed)
With pain/ erythema and limited rom  I do not think papule on chest is involved (inst to stop the valtrex) Her xray is consistent with erosive changes (see report)- suspect gout or auto immune process/arthritis  Labs so far are neg incl RF and sed rate and uric acid  Puzzling picture- will ref to rheumatology asap  Also continue meloxicam and percocet  Asked pt to update if worse or new symptoms

## 2012-10-23 NOTE — Progress Notes (Signed)
Subjective:    Patient ID: Pamela Morales, female    DOB: 1940/07/24, 72 y.o.   MRN: [id number]  HPI Here with suspected shingles  R arm started to hurt last Tuesday - noticed it in the am -- and then hand started hurting  Got worse and worse- felt inflammed  Then her L leg started to hurt her and her back   Came and saw Dr Ermalene Searing on Friday  Did some xrays -nl  Did some labs - yesterday  Bad stiffness in the am   Taking a shower yesterday am - noticed a spot / blister on her arm   We called in valtrex - she started that    Patient Active Problem List   Diagnosis Date Noted  . Right wrist pain 10/19/2012  . Left knee pain 10/19/2012  . Hearing loss 09/19/2012  . Tonsillith 09/19/2012  . 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 Sleep disorder 01/07/2011  . Colon polyps 07/16/2010  . BACK PAIN WITH RADICULOPATHY 03/15/2007  . History of herpes zoster 02/23/2007  . ESSENTIAL HYPERTENSION 02/05/2007  . OSTEOPENIA 02/05/2007  . ECZEMA, ATOPIC 01/29/2007  . ROSACEA 01/29/2007  . BASAL CELL CARCINOMA, HX OF 01/29/2007   Past Medical History  Diagnosis Date  . Depression   . Osteopenia   . Eczema   . Hypertension   . Rosacea   . Zoster 02/09  . 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 Adenomatous polyp of colon   . 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 CA - cancer of ovary    Past Surgical History  Procedure Laterality Date  . Cholecystectomy  08/1996    10 yrs ago  . Abdominal hysterectomy  1996    BSO fibroids, incidental ovarian CA finding  . Breast surgery      rt total mastectomy for breast CA  . Lipoma removal  11/07  . Bladder tack    . Colonoscopy     History  Substance Use Topics  . Smoking status: Former Smoker    Quit date: 01/03/1990  . Smokeless tobacco: Never Used  . Alcohol Use: 1.0 oz/week    2 drink(s) per week     Comment: social   Family History  Problem Relation Age of Onset  .  Cancer Mother     stomach  . Hypertension Mother   . Heart disease Mother     CAD  . Stomach cancer Mother   . Cancer Father     lung  . Cancer Sister     colon, skin  . Heart disease Sister 75    MI  . Diabetes Sister   . Colon cancer Sister   . Heart disease Brother     HTN and MI  . Hypertension Brother   . Esophageal cancer Neg Hx    No Known Allergies Current Outpatient Prescriptions on File Prior to Visit  Medication Sig Dispense Refill  . amLODipine (NORVASC) 10 MG tablet Take 1 tablet (10 mg total) by mouth daily.  90 tablet  3  . BIOTIN PO Take 1 tablet by mouth every evening.       . Calcium Carbonate-Vitamin D (CALTRATE 600+D PO) Take 1 tablet by mouth every evening.      Marland Kitchen EPINEPHrine (EPIPEN) 0.3 mg/0.3 mL SOAJ Inject 0.3 mLs (0.3 mg total) into the muscle as needed.  1  Device  0  . estradiol (ESTRACE VAGINAL) 0.1 MG/GM vaginal cream Insert 1/4 applicatorful of cream vaginally three times per week  14 g  11  . FLUoxetine (PROZAC) 20 MG capsule Take 20 mg by mouth daily.      Marland Kitchen MAGNESIUM PO Take 1 tablet by mouth daily as needed (only takes when working out, for muscle cramps).      . meloxicam (MOBIC) 15 MG tablet Take 1 tablet (15 mg total) by mouth daily.  30 tablet  0  . metroNIDAZOLE (METROCREAM) 0.75 % cream Apply topically 2 (two) times daily.  60 g  3  . Multiple Vitamin (MULTIVITAMIN) tablet Take 1 tablet by mouth daily.        . naproxen sodium (ANAPROX) 220 MG tablet Take 220 mg by mouth daily as needed (for arthritis).      Marland Kitchen oxyCODONE-acetaminophen (PERCOCET) 7.5-325 MG per tablet Take 1 tablet by mouth every 4 (four) hours as needed for pain.      . valACYclovir (VALTREX) 1000 MG tablet Take 1 tablet (1,000 mg total) by mouth 3 (three) times daily.  21 tablet  0  . zoster vaccine live, PF, (ZOSTAVAX) 40981 UNT/0.65ML injection Inject 19,400 Units into the skin once.  1 vial  0   No current facility-administered medications on file prior to visit.     Review of Systems Review of Systems  Constitutional: Negative for fever, appetite change, fatigue and unexpected weight change.  Eyes: Negative for pain and visual disturbance.  Respiratory: Negative for cough and shortness of breath.   Cardiovascular: Negative for cp or palpitations    Gastrointestinal: Negative for nausea, diarrhea and constipation.  Genitourinary: Negative for urgency and frequency.  Skin: Negative for pallor and pos for a few papules  MSK pos for swelling and redness of R wrist/ pos for L knee pain without swelling that is improved  Neurological: Negative for weakness, light-headedness, numbness and headaches.  Hematological: Negative for adenopathy. Does not bruise/bleed easily.  Psychiatric/Behavioral: Negative for dysphoric mood. The patient is not nervous/anxious.         Objective:   Physical Exam  Constitutional: She appears well-developed and well-nourished.  HENT:  Head: Normocephalic and atraumatic.  Eyes: Conjunctivae and EOM are normal. Pupils are equal, round, and reactive to light. No scleral icterus.  Neck: Normal range of motion. Neck supple.  No CS tenderness  Cardiovascular: Regular rhythm and normal heart sounds.   Pulmonary/Chest: Effort normal and breath sounds normal. No respiratory distress. She has no wheezes. She has no rales.  Musculoskeletal: She exhibits edema and tenderness.  R wrist is swollen- dorsal/ laterally- with mild erythema and warmth Full rom with pain  No effusion noted or crepitus  No skin interruption   No other joints involved  Lymphadenopathy:    She has no cervical adenopathy.  Neurological: She is alert. She displays normal reflexes. She exhibits normal muscle tone.  Skin: Skin is warm and dry. There is erythema.  Small papule on R lat cw that is 1-2 mm, another on L lat cw-- neither resemble zoster at this time   Psychiatric: She has a normal mood and affect.          Assessment & Plan:

## 2012-12-19 ENCOUNTER — Encounter: Payer: Self-pay | Admitting: Family Medicine

## 2012-12-19 ENCOUNTER — Ambulatory Visit (INDEPENDENT_AMBULATORY_CARE_PROVIDER_SITE_OTHER): Payer: Medicare Other | Admitting: Family Medicine

## 2012-12-19 VITALS — BP 130/68 | HR 54 | Temp 97.7°F | Ht 60.0 in | Wt 126.8 lb

## 2012-12-19 DIAGNOSIS — Z1211 Encounter for screening for malignant neoplasm of colon: Secondary | ICD-10-CM | POA: Insufficient documentation

## 2012-12-19 DIAGNOSIS — D649 Anemia, unspecified: Secondary | ICD-10-CM | POA: Insufficient documentation

## 2012-12-19 LAB — RETICULOCYTES
RBC.: 4.12 MIL/uL (ref 3.87–5.11)
Retic Ct Pct: 1.6 % (ref 0.4–2.3)

## 2012-12-19 LAB — CBC WITH DIFFERENTIAL/PLATELET
Basophils Absolute: 0 10*3/uL (ref 0.0–0.1)
Basophils Relative: 0.4 % (ref 0.0–3.0)
Eosinophils Absolute: 0.1 10*3/uL (ref 0.0–0.7)
HCT: 33 % — ABNORMAL LOW (ref 36.0–46.0)
Hemoglobin: 11.2 g/dL — ABNORMAL LOW (ref 12.0–15.0)
Lymphocytes Relative: 26 % (ref 12.0–46.0)
Lymphs Abs: 2.2 10*3/uL (ref 0.7–4.0)
MCHC: 33.8 g/dL (ref 30.0–36.0)
Neutro Abs: 5.6 10*3/uL (ref 1.4–7.7)
RBC: 4.07 Mil/uL (ref 3.87–5.11)
RDW: 15.8 % — ABNORMAL HIGH (ref 11.5–14.6)

## 2012-12-19 LAB — IBC PANEL: Saturation Ratios: 16.4 % — ABNORMAL LOW (ref 20.0–50.0)

## 2012-12-19 LAB — B12 AND FOLATE PANEL: Folate: >24.8 ng/mL (ref >5.9)

## 2012-12-19 LAB — FERRITIN: Ferritin: 157.9 ng/mL (ref 10.0–291.0)

## 2012-12-19 NOTE — Patient Instructions (Signed)
Labs for anemia today so we can figure out what type you have  Try to eat a balanced diet  We will make a plan based on result

## 2012-12-19 NOTE — Progress Notes (Signed)
Pre-visit discussion using our clinic review tool. No additional management support is needed unless otherwise documented below in the visit note.  

## 2012-12-19 NOTE — Progress Notes (Signed)
Subjective:    Patient ID: Pamela Morales, female    DOB: 02/03/1940, 72 y.o.   MRN: [id number]  HPI  Here for f/u of anemia - found by her rheumatologist   Hb was 10.9 Was 11.6 - in august   She is on methotrexate -- just increased he dose She is sluggish but starting to get some energy back  Pain is improved / back to the gym   Dx with rheum arthritis - and her joint swelling is improved   Also had a viral uri at the time- getting over that   Coloscopy 8/12 - tic/ polyps -- 2017 is recall  No blood in stool  No black stool  No bowel symptoms  (but a little constipation from the methotrexate)  Takes dulcolax occasionally   She has had anemia in the past   Patient Active Problem List   Diagnosis Date Noted  . Swelling of joint of right wrist 10/23/2012  . Right wrist pain 10/19/2012  . Left knee pain 10/19/2012  . Hearing loss 09/19/2012  . Tonsillith 09/19/2012  . 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 Sleep disorder 01/07/2011  . Colon polyps 07/16/2010  . BACK PAIN WITH RADICULOPATHY 03/15/2007  . History of herpes zoster 02/23/2007  . ESSENTIAL HYPERTENSION 02/05/2007  . OSTEOPENIA 02/05/2007  . ECZEMA, ATOPIC 01/29/2007  . ROSACEA 01/29/2007  . BASAL CELL CARCINOMA, HX OF 01/29/2007   Past Medical History  Diagnosis Date  . Depression   . Osteopenia   . Eczema   . Hypertension   . Rosacea   . Zoster 02/09  . 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 Adenomatous polyp of colon   . 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 CA - cancer of ovary    Past Surgical History  Procedure Laterality Date  . Cholecystectomy  08/1996    10 yrs ago  . Abdominal hysterectomy  1996    BSO fibroids, incidental ovarian CA finding  . Breast surgery      rt total mastectomy for breast CA  . Lipoma removal  11/07  . Bladder tack    . Colonoscopy     History  Substance Use Topics  . Smoking status: Former  Smoker    Quit date: 01/03/1990  . Smokeless tobacco: Never Used  . Alcohol Use: 1.0 oz/week    2 drink(s) per week     Comment: social   Family History  Problem Relation Age of Onset  . Cancer Mother     stomach  . Hypertension Mother   . Heart disease Mother     CAD  . Stomach cancer Mother   . Cancer Father     lung  . Cancer Sister     colon, skin  . Heart disease Sister 63    MI  . Diabetes Sister   . Colon cancer Sister   . Heart disease Brother     HTN and MI  . Hypertension Brother   . Esophageal cancer Neg Hx    No Known Allergies Current Outpatient Prescriptions on File Prior to Visit  Medication Sig Dispense Refill  . amLODipine (NORVASC) 10 MG tablet Take 1 tablet (10 mg total) by mouth daily.  90 tablet  3  . BIOTIN PO Take 1 tablet by mouth every evening.       . Calcium Carbonate-Vitamin  D (CALTRATE 600+D PO) Take 1 tablet by mouth every evening.      Marland Kitchen EPINEPHrine (EPIPEN) 0.3 mg/0.3 mL SOAJ Inject 0.3 mLs (0.3 mg total) into the muscle as needed.  1 Device  0  . estradiol (ESTRACE VAGINAL) 0.1 MG/GM vaginal cream Insert 1/4 applicatorful of cream vaginally three times per week  14 g  11  . FLUoxetine (PROZAC) 20 MG capsule Take 20 mg by mouth daily.      Marland Kitchen MAGNESIUM PO Take 1 tablet by mouth daily as needed (only takes when working out, for muscle cramps).      . metroNIDAZOLE (METROCREAM) 0.75 % cream Apply topically 2 (two) times daily.  60 g  3  . Multiple Vitamin (MULTIVITAMIN) tablet Take 1 tablet by mouth daily.        Marland Kitchen oxyCODONE-acetaminophen (PERCOCET) 7.5-325 MG per tablet Take 1 tablet by mouth every 4 (four) hours as needed for pain.      Marland Kitchen zoster vaccine live, PF, (ZOSTAVAX) 16109 UNT/0.65ML injection Inject 19,400 Units into the skin once.  1 vial  0   No current facility-administered medications on file prior to visit.      Review of Systems Review of Systems  Constitutional: Negative for fever, appetite change,  and unexpected weight  change. pos for fatigue  Eyes: Negative for pain and visual disturbance.  Respiratory: Negative for cough and shortness of breath.   Cardiovascular: Negative for cp or palpitations    Gastrointestinal: Negative for nausea, diarrhea and constipation.  Genitourinary: Negative for urgency and frequency.  Skin: Negative for pallor or rash   MSK pos for joint pain and swelling that is much improved Neurological: Negative for weakness, light-headedness, numbness and headaches.  Hematological: Negative for adenopathy. Does not bruise/bleed easily.  Psychiatric/Behavioral: Negative for dysphoric mood. The patient is not nervous/anxious.         Objective:   Physical Exam  Constitutional: She appears well-developed and well-nourished. No distress.  HENT:  Head: Normocephalic and atraumatic.  Right Ear: External ear normal.  Left Ear: External ear normal.  Nose: Nose normal.  Mouth/Throat: Oropharynx is clear and moist.  Eyes: Conjunctivae and EOM are normal. Pupils are equal, round, and reactive to light. Right eye exhibits no discharge. Left eye exhibits no discharge. No scleral icterus.  No conj pallor   Neck: Normal range of motion. Neck supple. No JVD present. No thyromegaly present.  Cardiovascular: Normal rate, regular rhythm, normal heart sounds and intact distal pulses.  Exam reveals no gallop.   Pulmonary/Chest: Effort normal and breath sounds normal. No respiratory distress. She has no wheezes. She has no rales.  Abdominal: Soft. Bowel sounds are normal. She exhibits no distension and no mass. There is no tenderness.  Musculoskeletal: She exhibits no edema.  Lymphadenopathy:    She has no cervical adenopathy.  Neurological: She is alert. She has normal reflexes. She exhibits normal muscle tone.  Skin: Skin is warm and dry. No rash noted. No erythema. No pallor.  No pallor   Psychiatric: She has a normal mood and affect.          Assessment & Plan:

## 2012-12-20 ENCOUNTER — Encounter: Payer: Self-pay | Admitting: *Deleted

## 2012-12-20 NOTE — Assessment & Plan Note (Signed)
colonosc is up to date and due for screening recall 2017 Hx of polyps and tics No symptoms Anemia today

## 2012-12-20 NOTE — Assessment & Plan Note (Signed)
Hb 10.9 at her rheumatologist - pt is on methotrexate  Down from summer time -pt has hx of anemia in the past and she is not sure why Repeat cbc with iron studies/ ifob card/ b12 and folate and retic ct  Make plan from there

## 2012-12-25 ENCOUNTER — Other Ambulatory Visit (INDEPENDENT_AMBULATORY_CARE_PROVIDER_SITE_OTHER): Payer: Medicare Other

## 2012-12-25 DIAGNOSIS — Z1211 Encounter for screening for malignant neoplasm of colon: Secondary | ICD-10-CM

## 2012-12-27 ENCOUNTER — Telehealth: Payer: Self-pay | Admitting: Family Medicine

## 2012-12-27 DIAGNOSIS — R195 Other fecal abnormalities: Secondary | ICD-10-CM | POA: Insufficient documentation

## 2012-12-27 NOTE — Telephone Encounter (Signed)
Message copied by Judy Pimple on Thu Dec 27, 2012  3:07 PM ------      Message from: Shon Millet      Created: Wed Dec 26, 2012  9:56 AM       Pt notified of ifob results and agrees with referral, I advise pt that Marion/Linda will call to schedule an appt ------

## 2013-01-01 ENCOUNTER — Ambulatory Visit (INDEPENDENT_AMBULATORY_CARE_PROVIDER_SITE_OTHER): Payer: Medicare Other | Admitting: Gastroenterology

## 2013-01-01 ENCOUNTER — Encounter: Payer: Self-pay | Admitting: Gastroenterology

## 2013-01-01 VITALS — BP 140/72 | HR 72 | Ht 60.0 in | Wt 127.0 lb

## 2013-01-01 DIAGNOSIS — R195 Other fecal abnormalities: Secondary | ICD-10-CM

## 2013-01-01 DIAGNOSIS — Z800 Family history of malignant neoplasm of digestive organs: Secondary | ICD-10-CM

## 2013-01-01 DIAGNOSIS — Z860100 Personal history of colon polyps, unspecified: Secondary | ICD-10-CM | POA: Insufficient documentation

## 2013-01-01 NOTE — Patient Instructions (Signed)
We have given you a sample of the Suprep for the colonoscopy. You have been scheduled for a colonoscopy with propofol. Please follow written instructions given to you at your visit today.

## 2013-01-01 NOTE — Progress Notes (Signed)
01/01/2013 BEZA STEPPE [id number] [birth year]   History of Present Illness:  This is a 72 year old female who is known to Dr. Arlyce Dice.  She has a personal history of breast cancer, ovarian cancer, and a 15 mm colon polyp with focal high grade dysplasia in the past on colonoscopy in 08/2008.  Last colonoscopy in 08/2010 showed a 2 mm diminutive polyp in the cecum that was a TA without dysplasia.  Also has two sisters with colon cancer.  She is mildly anemic with Hgb 11.2 grams, which is normocytic with relatively normal iron studies (likely anemia of chronic disease given her RA), but also now with a positive IFOB.  Ferritin 157.9, iron 45, iron % sat 16.4.  Vitamin B12 normal at 1021, folate >24.8.  Denies dark or bloody stools.  Says that at the time that she did the stool study she was constipated, straining to have a BM, and using enemas.  No other GI complaints.     Current Medications, Allergies, Past Medical History, Past Surgical History, Family History and Social History were reviewed in [employer] record.   Physical Exam: BP 140/72  Pulse 72  Ht 5' (1.524 m)  Wt 127 lb (57.607 kg)  BMI 24.80 kg/m2 General: Well developed white female in no acute distress Head: Normocephalic and atraumatic Eyes:  Sclerae anicteric, conjunctiva pink  Ears: Normal auditory acuity Lungs: Clear throughout to auscultation Heart: Regular rate and rhythm Abdomen: Soft, non-distended.  BS present.  Large low transverse abdominal scar noted.  No masses, no hepatomegaly. Normal bowel sounds.  Non-tender. Rectal:  Deferred.  Will be performed at the time of colonoscopy. Musculoskeletal: Symmetrical with no gross deformities  Extremities: No edema.  Neurological: Alert oriented x 4, grossly non-focal Psychological:  Alert and cooperative. Normal mood and affect  Assessment and Recommendations: -72 year old female with personal history of breast cancer, ovarian cancer, and a 15 mm colon  polyp with focal high grade dysplasia in the past.  Also has two sisters with colon cancer.  Is mildly anemic, which is normocytic with normal iron studies (likely anemia of chronic disease given her RA), but also now with a positive IFOB.    *Will schedule colonoscopy.  The risks, benefits, and alternatives were discussed with the patient and she consents to proceed.

## 2013-01-03 HISTORY — PX: MASTECTOMY: SHX3

## 2013-01-03 NOTE — Progress Notes (Signed)
Reviewed and agree with management. Yutaka Holberg D. Salli Bodin, M.D., FACG  

## 2013-01-09 ENCOUNTER — Encounter: Payer: Self-pay | Admitting: Gastroenterology

## 2013-01-18 ENCOUNTER — Encounter: Payer: Self-pay | Admitting: Gastroenterology

## 2013-01-18 ENCOUNTER — Ambulatory Visit (AMBULATORY_SURGERY_CENTER): Payer: Managed Care, Other (non HMO) | Admitting: Gastroenterology

## 2013-01-18 VITALS — BP 121/75 | HR 46 | Temp 98.3°F | Resp 20 | Ht 60.0 in | Wt 127.0 lb

## 2013-01-18 DIAGNOSIS — Z800 Family history of malignant neoplasm of digestive organs: Secondary | ICD-10-CM

## 2013-01-18 DIAGNOSIS — Z86010 Personal history of colonic polyps: Secondary | ICD-10-CM

## 2013-01-18 DIAGNOSIS — K5730 Diverticulosis of large intestine without perforation or abscess without bleeding: Secondary | ICD-10-CM

## 2013-01-18 MED ORDER — SODIUM CHLORIDE 0.9 % IV SOLN: 500.0000 mL | INTRAVENOUS | Status: DC

## 2013-01-18 NOTE — Op Note (Signed)
[facility]  [address] [place], [address]   COLONOSCOPY PROCEDURE REPORT  PATIENT: Pamela, Morales  MR#: [id number] BIRTHDATE: 09/23/1940 , 72  yrs. old GENDER: Female ENDOSCOPIST: Inda Castle, MD REFERRED BY: PROCEDURE DATE:  01/18/2013 PROCEDURE:   Colonoscopy, diagnostic First Screening Colonoscopy - Avg.  risk and is 50 yrs.  old or older - No.  Prior Negative Screening - Now for repeat screening. N/A  History of Adenoma - Now for follow-up colonoscopy & has been > or = to 3 yrs.  No.  It has been less than 3 yrs since last colonoscopy.  Other: See Comments  Polyps Removed Today? No. Recommend repeat exam, <10 yrs? Yes.  High risk (family or personal hx). ASA CLASS:   Class III INDICATIONS:Patient's immediate family history of colon cancer and Patient's personal history of adenomatous colon polyps.  2000 and adenomatous polyp with focal high-grade dysplasia; 2012 adenomatous colon polyp MEDICATIONS: MAC sedation, administered by CRNA and propofol (Diprivan) 200mg  IV  DESCRIPTION OF PROCEDURE:   After the risks benefits and alternatives of the procedure were thoroughly explained, informed consent was obtained.  A digital rectal exam revealed no abnormalities of the rectum.   The LB [id number]  endoscope was introduced through the anus and advanced to the cecum, which was identified by both the appendix and ileocecal valve. No adverse events experienced.   The quality of the prep was excellent using Suprep  The instrument was then slowly withdrawn as the colon was fully examined.      COLON FINDINGS: Moderate diverticulosis was noted in the sigmoid colon.   The colon was otherwise normal.  There was no diverticulosis, inflammation, polyps or cancers unless previously stated.  Retroflexed views revealed no abnormalities. The time to cecum=5 minutes 57 seconds.  Withdrawal time=7 minutes 59 seconds. The scope was withdrawn and the procedure  completed. COMPLICATIONS: There were no complications.  ENDOSCOPIC IMPRESSION: 1.   Moderate diverticulosis was noted in the sigmoid colon 2.   The colon was otherwise normal  RECOMMENDATIONS: Colonoscopy 5 years   eSigned:  Inda Castle, MD 01/18/2013 11:19 AM   cc:   PATIENT NAME:  Pamela Morales, Pamela Morales MR#: [id number]

## 2013-01-18 NOTE — Patient Instructions (Signed)
YOU HAD AN ENDOSCOPIC PROCEDURE TODAY AT THE [facility]: Refer to the procedure report that was given to you for any specific questions about what was found during the examination.  If the procedure report does not answer your questions, please call your gastroenterologist to clarify.  If you requested that your care partner not be given the details of your procedure findings, then the procedure report has been included in a sealed envelope for you to review at your convenience later.  YOU SHOULD EXPECT: Some feelings of bloating in the abdomen. Passage of more gas than usual.  Walking can help get rid of the air that was put into your GI tract during the procedure and reduce the bloating. If you had a lower endoscopy (such as a colonoscopy or flexible sigmoidoscopy) you may notice spotting of blood in your stool or on the toilet paper. If you underwent a bowel prep for your procedure, then you may not have a normal bowel movement for a few days.  DIET: Your first meal following the procedure should be a light meal and then it is ok to progress to your normal diet.  A half-sandwich or bowl of soup is an example of a good first meal.  Heavy or fried foods are harder to digest and may make you feel nauseous or bloated.  Likewise meals heavy in dairy and vegetables can cause extra gas to form and this can also increase the bloating.  Drink plenty of fluids but you should avoid alcoholic beverages for 24 hours.  ACTIVITY: Your care partner should take you home directly after the procedure.  You should plan to take it easy, moving slowly for the rest of the day.  You can resume normal activity the day after the procedure however you should NOT DRIVE or use heavy machinery for 24 hours (because of the sedation medicines used during the test).    SYMPTOMS TO REPORT IMMEDIATELY: A gastroenterologist can be reached at any hour.  During normal business hours, 8:30 AM to 5:00 PM Monday through Friday,  call [phone].  After hours and on weekends, please call the GI answering service at [phone] who will take a message and have the physician on call contact you.   Following lower endoscopy (colonoscopy or flexible sigmoidoscopy):  Excessive amounts of blood in the stool  Significant tenderness or worsening of abdominal pains  Swelling of the abdomen that is new, acute  Fever of 100F or higher  Following upper endoscopy (EGD)  Vomiting of blood or coffee ground material  New chest pain or pain under the shoulder blades  Painful or persistently difficult swallowing  New shortness of breath  Fever of 100F or higher  Black, tarry-looking stools  FOLLOW UP: If any biopsies were taken you will be contacted by phone or by letter within the next 1-3 weeks.  Call your gastroenterologist if you have not heard about the biopsies in 3 weeks.  Our staff will call the home number listed on your records the next business day following your procedure to check on you and address any questions or concerns that you may have at that time regarding the information given to you following your procedure. This is a courtesy call and so if there is no answer at the home number and we have not heard from you through the emergency physician on call, we will assume that you have returned to your regular daily activities without incident.  SIGNATURES/CONFIDENTIALITY: You and/or your care   partner have signed paperwork which will be entered into your electronic medical record.  These signatures attest to the fact that that the information above on your After Visit Summary has been reviewed and is understood.  Full responsibility of the confidentiality of this discharge information lies with you and/or your care-partner.YOU HAD AN ENDOSCOPIC PROCEDURE TODAY AT [facility]: Refer to the procedure report that was given to you for any specific questions about what was found during the examination.   If the procedure report does not answer your questions, please call your gastroenterologist to clarify.  If you requested that your care partner not be given the details of your procedure findings, then the procedure report has been included in a sealed envelope for you to review at your convenience later.  YOU SHOULD EXPECT: Some feelings of bloating in the abdomen. Passage of more gas than usual.  Walking can help get rid of the air that was put into your GI tract during the procedure and reduce the bloating. If you had a lower endoscopy (such as a colonoscopy or flexible sigmoidoscopy) you may notice spotting of blood in your stool or on the toilet paper. If you underwent a bowel prep for your procedure, then you may not have a normal bowel movement for a few days.  DIET: Your first meal following the procedure should be a light meal and then it is ok to progress to your normal diet.  A half-sandwich or bowl of soup is an example of a good first meal.  Heavy or fried foods are harder to digest and may make you feel nauseous or bloated.  Likewise meals heavy in dairy and vegetables can cause extra gas to form and this can also increase the bloating.  Drink plenty of fluids but you should avoid alcoholic beverages for 24 hours.  ACTIVITY: Your care partner should take you home directly after the procedure.  You should plan to take it easy, moving slowly for the rest of the day.  You can resume normal activity the day after the procedure however you should NOT DRIVE or use heavy machinery for 24 hours (because of the sedation medicines used during the test).    SYMPTOMS TO REPORT IMMEDIATELY: A gastroenterologist can be reached at any hour.  During normal business hours, 8:30 AM to 5:00 PM Monday through Friday, call [phone].  After hours and on weekends, please call the GI answering service at [phone] who will take a message and have the physician on call contact you.   Following lower  endoscopy (colonoscopy or flexible sigmoidoscopy):  Excessive amounts of blood in the stool  Significant tenderness or worsening of abdominal pains  Swelling of the abdomen that is new, acute  Fever of 100F or higher    FOLLOW UP: If any biopsies were taken you will be contacted by phone or by letter within the next 1-3 weeks.  Call your gastroenterologist if you have not heard about the biopsies in 3 weeks.  Our staff will call the home number listed on your records the next business day following your procedure to check on you and address any questions or concerns that you may have at that time regarding the information given to you following your procedure. This is a courtesy call and so if there is no answer at the home number and we have not heard from you through the emergency physician on call, we will assume that you have returned to your regular  daily activities without incident.  SIGNATURES/CONFIDENTIALITY: You and/or your care partner have signed paperwork which will be entered into your electronic medical record.  These signatures attest to the fact that that the information above on your After Visit Summary has been reviewed and is understood.  Full responsibility of the confidentiality of this discharge information lies with you and/or your care-partner.  Diverticulosis,high fiber information given.

## 2013-01-18 NOTE — Progress Notes (Signed)
Procedure ends, to recovery, report given and VSS. 

## 2013-01-21 ENCOUNTER — Telehealth: Payer: Self-pay | Admitting: *Deleted

## 2013-01-21 NOTE — Telephone Encounter (Signed)
  Follow up Call-  Call back number 01/18/2013 09/02/2010  Post procedure Call Back phone  # [phone]  Permission to leave phone message Yes -     Patient questions:  Do you have a fever, pain , or abdominal swelling? no Pain Score  0 *  Have you tolerated food without any problems? yes  Have you been able to return to your normal activities? yes  Do you have any questions about your discharge instructions: Diet   no Medications  no Follow up visit  no  Do you have questions or concerns about your Care? no  Actions: * If pain score is 4 or above: No action needed, pain <4.

## 2013-05-01 ENCOUNTER — Encounter: Payer: Self-pay | Admitting: Family Medicine

## 2013-05-02 ENCOUNTER — Encounter: Payer: Self-pay | Admitting: *Deleted

## 2013-05-28 ENCOUNTER — Encounter (INDEPENDENT_AMBULATORY_CARE_PROVIDER_SITE_OTHER): Payer: Self-pay | Admitting: General Surgery

## 2013-05-28 ENCOUNTER — Ambulatory Visit (INDEPENDENT_AMBULATORY_CARE_PROVIDER_SITE_OTHER): Payer: Medicare HMO | Admitting: General Surgery

## 2013-05-28 VITALS — BP 134/80 | HR 51 | Temp 97.6°F | Ht 61.0 in | Wt 135.0 lb

## 2013-05-28 DIAGNOSIS — L03113 Cellulitis of right upper limb: Secondary | ICD-10-CM

## 2013-05-28 DIAGNOSIS — L02519 Cutaneous abscess of unspecified hand: Secondary | ICD-10-CM

## 2013-05-28 DIAGNOSIS — L03119 Cellulitis of unspecified part of limb: Secondary | ICD-10-CM

## 2013-05-28 DIAGNOSIS — Z86000 Personal history of in-situ neoplasm of breast: Secondary | ICD-10-CM

## 2013-05-28 DIAGNOSIS — Z87898 Personal history of other specified conditions: Secondary | ICD-10-CM

## 2013-05-28 MED ORDER — DOXYCYCLINE HYCLATE 100 MG PO TABS: 100.0000 mg | ORAL_TABLET | Freq: Two times a day (BID) | ORAL | Status: DC

## 2013-05-28 NOTE — Progress Notes (Signed)
Patient ID: Pamela Morales, female   DOB: [birth year], 73 y.o.   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  She underwent right total mastectomy, sentinel lymph node biopsy by me and TRAM flap reconstruction by Dr. Denese Killings on 03/19/2008. 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 Pathologic stage Tis, N0. There is no evidence of recurrence to date. She was evaluated and discharged by her medical oncologist.  Recent mammograms at [facility], dated 04/30/2013 looks fine. No focal abnormality, category 2.  She was working in her garden about a week ago and has now noticed some redness and tenderness in the webspace between her calm and index finger on the right hand. A little bit of pain but no other symptoms. She has no other new health problems. She feels fine. She has no complaints about her breast. She exercises daily and feels well. She states the bulge in her upper abdomen is about the same and does not cause any pain.  Past history family history social history are documented on the chart, unchanged, and nontender except as described above. ROS: 10 system review of systems is negative except as described above   Exam: Patient looks well. No distress  Neck no adenopathy or mass  Lungs clear to auscultation bilaterally  Heart regular rate and rhythm, no murmur, no ectopy  Breast right mastectomy with TRAM reconstruction looks good. Soft. No nodules or ulcerations. No axillary adenopathy. No arm swelling. Full range of motion right shoulder. Left breast symmetrical. Soft. No palpable mass. No skin changes. No axillary adenopathy.  Abdomen TRAM reconstruction scars. There is a small scar in the right upper quadrant in the rectus sheath area. When she stands there seems to be a small hernia bulge that is reducible. No overlying skin problems.  Extremities: There is a 1.5 cm area of erythema and slight tenderness in the web space  between the right palm and index finger. Very localized. Tissues are soft.  Assessment:  High-grade DCIS right breast, 3.5 cm, receptor negative  No evidence of recurrence 4 years following right total mastectomy sentinel node biopsy and TRAM reconstruction  Probable tiny, early hernia in right upper quadrant abdominal wall  Cellulitis right hand, not involving fingers, between first and second web space.   Plan: Since she is minimally symptomatic, I do not advise any intervention for her hernia at this time. Told her to contact me if it  becomes larger or more painful  Return to see me in one year for annual checkup after she gets her annual mammograms. She desires to continue to see me annually For the right hand cellulitis, I gave her a prescription for doxycycline 100 mg by mouth twice a day x10 days I strongly encouraged her to see a hand surgeon, but she declined. She'll call me back if she does not promptly respond to doxycycline or changes her mind.   Edsel Petrin. Dalbert Batman, M.D., [facility]  General and Minimally invasive Surgery  Breast and Colorectal Surgery  Office: [phone]  Pager: [phone]

## 2013-05-28 NOTE — Patient Instructions (Signed)
Examination of your right mastectomy and TRAM flap, as well as your left breast and all the regional lymph nodes is normal. Your mammograms are also normal. There is no evidence of cancer.  The localized redness in your right hand between the thumb and first finger looks like a low-grade cellulitis. I have called in a prescription for doxycycline to your pharmacy and your are to  take that for 10 full days.  Put warm soaks on the right hand 3 times daily  You have declined referral to a hand surgeon. Please call me back if you change your mind.  Return to see Dr. Dalbert Batman in one year after future mammograms

## 2013-06-07 ENCOUNTER — Ambulatory Visit (INDEPENDENT_AMBULATORY_CARE_PROVIDER_SITE_OTHER): Payer: Managed Care, Other (non HMO) | Admitting: Internal Medicine

## 2013-06-07 ENCOUNTER — Encounter: Payer: Self-pay | Admitting: Internal Medicine

## 2013-06-07 VITALS — BP 120/68 | HR 57 | Temp 97.5°F | Wt 134.0 lb

## 2013-06-07 DIAGNOSIS — L03119 Cellulitis of unspecified part of limb: Secondary | ICD-10-CM

## 2013-06-07 DIAGNOSIS — M25431 Effusion, right wrist: Secondary | ICD-10-CM

## 2013-06-07 DIAGNOSIS — L03113 Cellulitis of right upper limb: Secondary | ICD-10-CM

## 2013-06-07 DIAGNOSIS — M25439 Effusion, unspecified wrist: Secondary | ICD-10-CM

## 2013-06-07 DIAGNOSIS — L02519 Cutaneous abscess of unspecified hand: Secondary | ICD-10-CM

## 2013-06-07 MED ORDER — SULFAMETHOXAZOLE-TMP DS 800-160 MG PO TABS: 1.0000 | ORAL_TABLET | Freq: Two times a day (BID) | ORAL | Status: DC

## 2013-06-07 NOTE — Progress Notes (Signed)
Pre visit review using our clinic review tool, if applicable. No additional management support is needed unless otherwise documented below in the visit note. 

## 2013-06-07 NOTE — Progress Notes (Signed)
Subjective:    Patient ID: Pamela Morales, female    DOB: 13-Jan-1940, 73 y.o.   MRN: [id number]  HPI  Pt presents to the clinic today with c/o a " itchy spot" on her right hand. This started 1 month ago. The spot is located between her thumb and index finger. She was prescribed an antibiotic by her surgeon, which has seemed to help. She is concerned because she is almost out of the antibiotic and the swelling/redness has not gone completely down. She denies fever, chills or body aches.  Review of Systems      Past Medical History  Diagnosis Date  . Depression   . Osteopenia   . Eczema   . Hypertension   . Rosacea   . Zoster 02/09  . 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 Adenomatous polyp of colon   . 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 CA - cancer of ovary     Current Outpatient Prescriptions  Medication Sig Dispense Refill  . amLODipine (NORVASC) 10 MG tablet Take 1 tablet (10 mg total) by mouth daily.  90 tablet  3  . BIOTIN PO Take 1 tablet by mouth every evening.       . bisacodyl (DULCOLAX) 5 MG EC tablet Take 5 mg by mouth as needed for moderate constipation.      . Calcium Carbonate-Vitamin D (CALTRATE 600+D PO) Take 1 tablet by mouth every evening.      Marland Kitchen doxycycline (VIBRA-TABS) 100 MG tablet Take 1 tablet (100 mg total) by mouth 2 (two) times daily.  20 tablet  1  . EPINEPHrine (EPIPEN) 0.3 mg/0.3 mL SOAJ Inject 0.3 mLs (0.3 mg total) into the muscle as needed.  1 Device  0  . estradiol (ESTRACE VAGINAL) 0.1 MG/GM vaginal cream Insert 1/4 applicatorful of cream vaginally three times per week  14 g  11  . FLUoxetine (PROZAC) 20 MG capsule Take 20 mg by mouth daily.      . folic acid (FOLVITE) 1 MG tablet Take 1 mg by mouth daily.      Marland Kitchen gabapentin (NEURONTIN) 100 MG capsule Take 1 capsule by mouth 3 (three) times daily.      Marland Kitchen MAGNESIUM PO Take 1 tablet by mouth daily as needed (only takes when working out, for muscle  cramps).      . methotrexate (RHEUMATREX) 2.5 MG tablet Take 6 tablets by mouth once a week.      . methotrexate (RHEUMATREX) 2.5 MG tablet Take 7.5 mg by mouth 3 (three) times a week.      . metroNIDAZOLE (METROCREAM) 0.75 % cream Apply topically 2 (two) times daily.  60 g  3  . Multiple Vitamin (MULTIVITAMIN) tablet Take 1 tablet by mouth daily.        Marland Kitchen oxyCODONE-acetaminophen (PERCOCET) 7.5-325 MG per tablet Take 1 tablet by mouth every 4 (four) hours as needed for pain.      Marland Kitchen zoster vaccine live, PF, (ZOSTAVAX) 39030 UNT/0.65ML injection Inject 19,400 Units into the skin once.  1 vial  0   No current facility-administered medications for this visit.    No Known Allergies  Family History  Problem Relation Age of Onset  . Cancer Mother     stomach  . Hypertension Mother   . Heart disease Mother     CAD  . Stomach cancer Mother   .  Cancer Father     lung  . Cancer Sister     colon, skin  . Heart disease Sister 32    MI  . Diabetes Sister   . Colon cancer Sister   . Heart disease Brother     HTN and MI  . Hypertension Brother   . Esophageal cancer Neg Hx     History   Social History  . Marital Status: Married    Spouse Name: N/A    Number of Children: N/A  . Years of Education: N/A   Occupational History  . Not on file.   Social History Main Topics  . Smoking status: Former Smoker    Quit date: 01/03/1990  . Smokeless tobacco: Never Used  . Alcohol Use: 1.0 oz/week    2 drink(s) per week     Comment: social  . Drug Use: No  . Sexual Activity: Not on file   Other Topics Concern  . Not on file   Social History Narrative  . No narrative on file     Constitutional: Denies fever, malaise, fatigue, headache or abrupt weight changes.  Skin: Pt reports "spot" on right hand. Denies redness, rashes, lesions or ulcercations.    No other specific complaints in a complete review of systems (except as listed in HPI above).  Objective:   Physical Exam   BP  120/68  Pulse 57  Temp(Src) 97.5 F (36.4 C) (Oral)  Wt 134 lb (60.782 kg)  SpO2 98% Wt Readings from Last 3 Encounters:  06/07/13 134 lb (60.782 kg)  05/28/13 135 lb (61.236 kg)  01/18/13 127 lb (57.607 kg)    General: Appears her stated age, well developed, well nourished in NAD. Skin:  Nickel size area of cellulitis noted on webbing between thumb and first finger. Cardiovascular: Normal rate and rhythm. S1,S2 noted.  No murmur, rubs or gallops noted. No JVD or BLE edema. No carotid bruits noted. Pulmonary/Chest: Normal effort and positive vesicular breath sounds. No respiratory distress. No wheezes, rales or ronchi noted.   BMET    Component Value Date/Time   NA 138 08/03/2012 1201   K 4.6 08/03/2012 1201   CL 106 08/03/2012 1201   CO2 26 08/03/2012 1201   GLUCOSE 86 08/03/2012 1201   BUN 30* 08/03/2012 1201   CREATININE 1.2 08/03/2012 1201   CALCIUM 9.5 08/03/2012 1201   GFRNONAA 46* 05/07/2008 1550   GFRAA  Value: 56        The eGFR has been calculated using the MDRD equation. This calculation has not been validated in all clinical situations. eGFR's persistently <60 mL/min signify possible Chronic Kidney Disease.* 05/07/2008 1550    Lipid Panel     Component Value Date/Time   CHOL 227* 08/03/2012 1201   TRIG 217.0* 08/03/2012 1201   HDL 46.70 08/03/2012 1201   CHOLHDL 5 08/03/2012 1201   VLDL 43.4* 08/03/2012 1201   LDLCALC 112* 07/17/2008 1213    CBC    Component Value Date/Time   WBC 8.6 12/19/2012 1419   WBC 7.8 04/16/2008 1320   RBC 4.07 12/19/2012 1419   RBC 4.12 12/19/2012 1419   RBC 4.01 04/16/2008 1320   HGB 11.2* 12/19/2012 1419   HGB 11.2* 04/16/2008 1320   HCT 33.0* 12/19/2012 1419   HCT 33.1* 04/16/2008 1320   PLT 387.0 12/19/2012 1419   PLT 264 04/16/2008 1320   MCV 81.0 12/19/2012 1419   MCV 82.6 04/16/2008 1320   MCH 27.9 04/16/2008 1320   MCHC  33.8 12/19/2012 1419   MCHC 33.8 04/16/2008 1320   RDW 15.8* 12/19/2012 1419   RDW 13.3 04/16/2008 1320   LYMPHSABS 2.2  12/19/2012 1419   LYMPHSABS 2.6 04/16/2008 1320   MONOABS 0.6 12/19/2012 1419   MONOABS 0.7 04/16/2008 1320   EOSABS 0.1 12/19/2012 1419   EOSABS 0.2 04/16/2008 1320   BASOSABS 0.0 12/19/2012 1419   BASOSABS 0.0 04/16/2008 1320    Hgb A1C No results found for this basename: HGBA1C        Assessment & Plan:   Cellulitis:  Finish the doxycycline Start Septra tomorrow BID x 7 days Watch for fever, increased swelling, pain or drainage  RTC as needed or if symptoms persist or worsen.

## 2013-06-07 NOTE — Patient Instructions (Addendum)
Cellulitis Cellulitis is an infection of the skin and the tissue beneath it. The infected area is usually red and tender. Cellulitis occurs most often in the arms and lower legs.  CAUSES  Cellulitis is caused by bacteria that enter the skin through cracks or cuts in the skin. The most common types of bacteria that cause cellulitis are Staphylococcus and Streptococcus. SYMPTOMS   Redness and warmth.  Swelling.  Tenderness or pain.  Fever. DIAGNOSIS  Your caregiver can usually determine what is wrong based on a physical exam. Blood tests may also be done. TREATMENT  Treatment usually involves taking an antibiotic medicine. HOME CARE INSTRUCTIONS   Take your antibiotics as directed. Finish them even if you start to feel better.  Keep the infected arm or leg elevated to reduce swelling.  Apply a warm cloth to the affected area up to 4 times per day to relieve pain.  Only take over-the-counter or prescription medicines for pain, discomfort, or fever as directed by your caregiver.  Keep all follow-up appointments as directed by your caregiver. SEEK MEDICAL CARE IF:   You notice red streaks coming from the infected area.  Your red area gets larger or turns dark in color.  Your bone or joint underneath the infected area becomes painful after the skin has healed.  Your infection returns in the same area or another area.  You notice a swollen bump in the infected area.  You develop new symptoms. SEEK IMMEDIATE MEDICAL CARE IF:   You have a fever.  You feel very sleepy.  You develop vomiting or diarrhea.  You have a general ill feeling (malaise) with muscle aches and pains. MAKE SURE YOU:   Understand these instructions.  Will watch your condition.  Will get help right away if you are not doing well or get worse. Document Released: 09/29/2004 Document Revised: 06/21/2011 Document Reviewed: 03/07/2011 [employer] Patient Information 2014 [employer].  

## 2013-06-13 ENCOUNTER — Encounter: Payer: Self-pay | Admitting: Internal Medicine

## 2013-06-13 ENCOUNTER — Telehealth: Payer: Self-pay | Admitting: Family Medicine

## 2013-06-13 ENCOUNTER — Ambulatory Visit (INDEPENDENT_AMBULATORY_CARE_PROVIDER_SITE_OTHER): Payer: Managed Care, Other (non HMO) | Admitting: Internal Medicine

## 2013-06-13 VITALS — BP 122/70 | HR 68 | Temp 98.2°F | Wt 132.0 lb

## 2013-06-13 DIAGNOSIS — R5383 Other fatigue: Secondary | ICD-10-CM

## 2013-06-13 DIAGNOSIS — T50905A Adverse effect of unspecified drugs, medicaments and biological substances, initial encounter: Secondary | ICD-10-CM

## 2013-06-13 DIAGNOSIS — R110 Nausea: Secondary | ICD-10-CM

## 2013-06-13 DIAGNOSIS — R450 Nervousness: Secondary | ICD-10-CM

## 2013-06-13 DIAGNOSIS — T887XXA Unspecified adverse effect of drug or medicament, initial encounter: Secondary | ICD-10-CM

## 2013-06-13 DIAGNOSIS — R5381 Other malaise: Secondary | ICD-10-CM

## 2013-06-13 DIAGNOSIS — R6883 Chills (without fever): Secondary | ICD-10-CM

## 2013-06-13 NOTE — Progress Notes (Signed)
Subjective:    Patient ID: Pamela Morales, female    DOB: 1940/06/23, 73 y.o.   MRN: [id number]  HPI  Pt presents to the clinic today with c/o nervousness and feeling shaky. She also feels nauseated, fatigue, clammy and sweaty She reports this started 4 days. She is currently on Bactrim, started 5 days ago for cellulitis of right hand. She denies fever, vomiting or diarrhea.  Review of Systems      Past Medical History  Diagnosis Date  . Depression   . Osteopenia   . Eczema   . Hypertension   . Rosacea   . Zoster 02/09  . 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 Adenomatous polyp of colon   . 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 CA - cancer of ovary     Current Outpatient Prescriptions  Medication Sig Dispense Refill  . amLODipine (NORVASC) 10 MG tablet Take 1 tablet (10 mg total) by mouth daily.  90 tablet  3  . BIOTIN PO Take 1 tablet by mouth every evening.       . bisacodyl (DULCOLAX) 5 MG EC tablet Take 5 mg by mouth as needed for moderate constipation.      . Calcium Carbonate-Vitamin D (CALTRATE 600+D PO) Take 1 tablet by mouth every evening.      Marland Kitchen doxycycline (VIBRA-TABS) 100 MG tablet Take 1 tablet (100 mg total) by mouth 2 (two) times daily.  20 tablet  1  . EPINEPHrine (EPIPEN) 0.3 mg/0.3 mL SOAJ Inject 0.3 mLs (0.3 mg total) into the muscle as needed.  1 Device  0  . estradiol (ESTRACE VAGINAL) 0.1 MG/GM vaginal cream Insert 1/4 applicatorful of cream vaginally three times per week  14 g  11  . FLUoxetine (PROZAC) 20 MG capsule Take 20 mg by mouth daily.      . folic acid (FOLVITE) 1 MG tablet Take 1 mg by mouth daily.      Marland Kitchen gabapentin (NEURONTIN) 100 MG capsule Take 1 capsule by mouth 3 (three) times daily.      Marland Kitchen MAGNESIUM PO Take 1 tablet by mouth daily as needed (only takes when working out, for muscle cramps).      . methotrexate (RHEUMATREX) 2.5 MG tablet Take 6 tablets by mouth once a week.      . methotrexate  (RHEUMATREX) 2.5 MG tablet Take 7.5 mg by mouth 3 (three) times a week.      . metroNIDAZOLE (METROCREAM) 0.75 % cream Apply topically 2 (two) times daily.  60 g  3  . Multiple Vitamin (MULTIVITAMIN) tablet Take 1 tablet by mouth daily.        Marland Kitchen oxyCODONE-acetaminophen (PERCOCET) 7.5-325 MG per tablet Take 1 tablet by mouth every 4 (four) hours as needed for pain.      Marland Kitchen sulfamethoxazole-trimethoprim (BACTRIM DS) 800-160 MG per tablet Take 1 tablet by mouth 2 (two) times daily.  14 tablet  0  . zoster vaccine live, PF, (ZOSTAVAX) 10932 UNT/0.65ML injection Inject 19,400 Units into the skin once.  1 vial  0   No current facility-administered medications for this visit.    No Known Allergies  Family History  Problem Relation Age of Onset  . Cancer Mother     stomach  . Hypertension Mother   . Heart disease Mother     CAD  . Stomach cancer Mother   . Cancer  Father     lung  . Cancer Sister     colon, skin  . Heart disease Sister 74    MI  . Diabetes Sister   . Colon cancer Sister   . Heart disease Brother     HTN and MI  . Hypertension Brother   . Esophageal cancer Neg Hx     History   Social History  . Marital Status: Married    Spouse Name: N/A    Number of Children: N/A  . Years of Education: N/A   Occupational History  . Not on file.   Social History Main Topics  . Smoking status: Former Smoker    Quit date: 01/03/1990  . Smokeless tobacco: Never Used  . Alcohol Use: 1.0 oz/week    2 drink(s) per week     Comment: social  . Drug Use: No  . Sexual Activity: Not on file   Other Topics Concern  . Not on file   Social History Narrative  . No narrative on file     Constitutional: Pt reports fatigue. Denies fever, malaise,  headache or abrupt weight changes.  Respiratory: Denies difficulty breathing, shortness of breath, cough or sputum production.   Cardiovascular: Denies chest pain, chest tightness, palpitations or swelling in the hands or feet.    Gastrointestinal: Pt reports nausea. Denies abdominal pain, bloating, constipation, diarrhea or blood in the stool.  GU: Denies urgency, frequency, pain with urination, burning sensation, blood in urine, odor or discharge. Skin: Pt reports redness of right hand. Denies  rashes, lesions or ulcercations.  Neurological: Pt reports jittery and nervous. Denies dizziness, difficulty with memory, difficulty with speech or problems with balance and coordination.   No other specific complaints in a complete review of systems (except as listed in HPI above).  Objective:   Physical Exam   BP 122/70  Pulse 68  Temp(Src) 98.2 F (36.8 C) (Oral)  Wt 132 lb (59.875 kg)  SpO2 97% Wt Readings from Last 3 Encounters:  06/13/13 132 lb (59.875 kg)  06/07/13 134 lb (60.782 kg)  05/28/13 135 lb (61.236 kg)    General: Appears her stated age, well developed, well nourished in NAD. Skin: Warm, dry and intact. Cellulitis of right hand has improved. Cardiovascular: Normal rate and rhythm. S1,S2 noted.  No murmur, rubs or gallops noted. No JVD or BLE edema. No carotid bruits noted. Pulmonary/Chest: Normal effort and positive vesicular breath sounds. No respiratory distress. No wheezes, rales or ronchi noted.  Abdomen: Soft and nontender. Normal bowel sounds, no bruits noted. No distention or masses noted. Liver, spleen and kidneys non palpable. Neurological: Alert and oriented. Cranial nerves II-XII intact. Coordination normal. +DTRs bilaterally.   BMET    Component Value Date/Time   NA 138 08/03/2012 1201   K 4.6 08/03/2012 1201   CL 106 08/03/2012 1201   CO2 26 08/03/2012 1201   GLUCOSE 86 08/03/2012 1201   BUN 30* 08/03/2012 1201   CREATININE 1.2 08/03/2012 1201   CALCIUM 9.5 08/03/2012 1201   GFRNONAA 46* 05/07/2008 1550   GFRAA  Value: 56        The eGFR has been calculated using the MDRD equation. This calculation has not been validated in all clinical situations. eGFR's persistently <60 mL/min signify  possible Chronic Kidney Disease.* 05/07/2008 1550    Lipid Panel     Component Value Date/Time   CHOL 227* 08/03/2012 1201   TRIG 217.0* 08/03/2012 1201   HDL 46.70 08/03/2012 1201  CHOLHDL 5 08/03/2012 1201   VLDL 43.4* 08/03/2012 1201   LDLCALC 112* 07/17/2008 1213    CBC    Component Value Date/Time   WBC 8.6 12/19/2012 1419   WBC 7.8 04/16/2008 1320   RBC 4.07 12/19/2012 1419   RBC 4.12 12/19/2012 1419   RBC 4.01 04/16/2008 1320   HGB 11.2* 12/19/2012 1419   HGB 11.2* 04/16/2008 1320   HCT 33.0* 12/19/2012 1419   HCT 33.1* 04/16/2008 1320   PLT 387.0 12/19/2012 1419   PLT 264 04/16/2008 1320   MCV 81.0 12/19/2012 1419   MCV 82.6 04/16/2008 1320   MCH 27.9 04/16/2008 1320   MCHC 33.8 12/19/2012 1419   MCHC 33.8 04/16/2008 1320   RDW 15.8* 12/19/2012 1419   RDW 13.3 04/16/2008 1320   LYMPHSABS 2.2 12/19/2012 1419   LYMPHSABS 2.6 04/16/2008 1320   MONOABS 0.6 12/19/2012 1419   MONOABS 0.7 04/16/2008 1320   EOSABS 0.1 12/19/2012 1419   EOSABS 0.2 04/16/2008 1320   BASOSABS 0.0 12/19/2012 1419   BASOSABS 0.0 04/16/2008 1320    Hgb A1C No results found for this basename: HGBA1C        Assessment & Plan:   Fatigue, chills, nausea, nervousness secondary to medication:  Likely r/t Bactrim She will stop it now Advised symptoms should resolve in a few days- if not RTC on Monday  RTC as needed or if symptoms persist or worsen

## 2013-06-13 NOTE — Progress Notes (Signed)
Pre visit review using our clinic review tool, if applicable. No additional management support is needed unless otherwise documented below in the visit note. 

## 2013-06-13 NOTE — Telephone Encounter (Signed)
Patient Information:  Caller Name: Leighton  Phone: [phone]  Patient: Pamela Morales, Pamela Morales  Gender: Female  DOB: March 24, 1940  Age: 73 Years  PCP: Loura Pardon [facility])  Office Follow Up:  Does the office need to follow up with this patient?: No  Instructions For The Office: N/A  RN Note:  Care advice provided.  Appt scheduled with Avie Echevaria NP. Patient PCP is not in office.  Symptoms  Reason For Call & Symptoms: Patient was seen in the office 06/07/13 for Cellulitis of her right hand. She was placed on Bactrim DS.  She reports for the last 3-4 days has had "shakey, sweaty,  nauseated  and nervous feeling"  No dizziness, no CP, no shortness of breath. No rash.  Reviewed Health History In EMR: Yes  Reviewed Medications In EMR: Yes  Reviewed Allergies In EMR: Yes  Reviewed Surgeries / Procedures: Yes  Date of Onset of Symptoms: 06/10/2013  Treatments Tried: Prozac increased x1 (dosage 1-2 daily)  Treatments Tried Worked: No  Guideline(s) Used:  Weakness (Generalized) and Fatigue  Disposition Per Guideline:   See Today in Office  Reason For Disposition Reached:   Moderate weakness (i.e., interferes with work, school, normal activities) and persists > 3 days  Advice Given:  Call Back If:  Unable to stand or walk  Passes out  Breathing difficulty occurs  You become worse.  Rest  : Lie down with feet elevated for 1 hour. This will improve blood flow and increase blood flow to the brain.  Cool Off  : If the weather is hot, apply a cold compress to the forehead or take a cool shower or bath.  Fluids  : Drink several glasses of fruit juice, other clear fluids, or water. This will improve hydration and blood glucose.  Patient Will Follow Care Advice:  YES  Appointment Scheduled:  06/13/2013 13:15:00 Appointment Scheduled Provider:  Webb Silversmith

## 2013-06-13 NOTE — Patient Instructions (Signed)

## 2013-06-17 ENCOUNTER — Encounter: Payer: Self-pay | Admitting: Family Medicine

## 2013-06-17 ENCOUNTER — Ambulatory Visit (INDEPENDENT_AMBULATORY_CARE_PROVIDER_SITE_OTHER): Payer: Managed Care, Other (non HMO) | Admitting: Family Medicine

## 2013-06-17 VITALS — BP 122/74 | HR 54 | Temp 98.6°F | Ht 61.0 in | Wt 131.5 lb

## 2013-06-17 DIAGNOSIS — L03019 Cellulitis of unspecified finger: Secondary | ICD-10-CM

## 2013-06-17 DIAGNOSIS — B370 Candidal stomatitis: Secondary | ICD-10-CM | POA: Insufficient documentation

## 2013-06-17 DIAGNOSIS — L03011 Cellulitis of right finger: Secondary | ICD-10-CM | POA: Insufficient documentation

## 2013-06-17 DIAGNOSIS — S61409A Unspecified open wound of unspecified hand, initial encounter: Secondary | ICD-10-CM | POA: Insufficient documentation

## 2013-06-17 MED ORDER — CLINDAMYCIN HCL 300 MG PO CAPS: 300.0000 mg | ORAL_CAPSULE | Freq: Three times a day (TID) | ORAL | Status: DC

## 2013-06-17 MED ORDER — MUPIROCIN 2 % EX OINT: TOPICAL_OINTMENT | CUTANEOUS | Status: DC

## 2013-06-17 MED ORDER — NYSTATIN 100000 UNIT/ML MT SUSP: OROMUCOSAL | Status: DC

## 2013-06-17 NOTE — Progress Notes (Signed)
Pre visit review using our clinic review tool, if applicable. No additional management support is needed unless otherwise documented below in the visit note. 

## 2013-06-17 NOTE — Assessment & Plan Note (Signed)
Prev tx with doxy and sulfa(worked but intol) Got better and than worse Trial of clinda  Wound cx obt - suspect it will be neg Warm comp Alert if worse

## 2013-06-17 NOTE — Patient Instructions (Signed)
Keep wound and finger clean with antibacterial soap and water  Warm compress is ok  Take the clindamycin three times daily  Use bactroban ointment on both areas Use nystatin mouthwash as directed  Follow up Wednesday with avail provider for a re check

## 2013-06-17 NOTE — Progress Notes (Signed)
Subjective:    Patient ID: Pamela Morales, female    DOB: Jan 16, 1940, 73 y.o.   MRN: [id number]  HPI Here with hand pain   Had a small area of redness/ swelling/ abrasion between thumb and index finger - R hand  Got infected and she happened to see Dr Dalbert Batman for breast chest - he gave her a full course of doxycyline  Saw [facility] - and was tx with sulfa abx -- and it made her have nausea/ha and fatigue - did not agree with her  Are improved  Thought it would be ok  Now it is coming back- and also she has redness around her nail on R index finger- is at base of nail Not draining  Tender to the touch Soaks in epsom salts and hot water often    no fever or malaise  No otc medicines    Mouth also feels funny/ swollen feeling /and cannot taste well  Tongue looks red  Throat does not hurt    Patient Active Problem List   Diagnosis Date Noted  . Family history of colon cancer 01/01/2013  . Fecal occult blood test positive 01/01/2013  . Personal history of colonic polyps 01/01/2013  . Occult blood positive stool 12/27/2012  . Anemia 12/19/2012  . Colon cancer screening 12/19/2012  . Swelling of joint of right wrist 10/23/2012  . Right wrist pain 10/19/2012  . Left knee pain 10/19/2012  . Hearing loss 09/19/2012  . Tonsillith 09/19/2012  . 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 Sleep disorder 01/07/2011  . Colon polyps 07/16/2010  . BACK PAIN WITH RADICULOPATHY 03/15/2007  . History of herpes zoster 02/23/2007  . ESSENTIAL HYPERTENSION 02/05/2007  . OSTEOPENIA 02/05/2007  . ECZEMA, ATOPIC 01/29/2007  . ROSACEA 01/29/2007  . BASAL CELL CARCINOMA, HX OF 01/29/2007   Past Medical History  Diagnosis Date  . Depression   . Osteopenia   . Eczema   . Hypertension   . Rosacea   . Zoster 02/09  . 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 Adenomatous polyp of colon   . 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 CA -  cancer of ovary    Past Surgical History  Procedure Laterality Date  . Cholecystectomy  08/1996    10 yrs ago  . Abdominal hysterectomy  1996    BSO fibroids, incidental ovarian CA finding  . Breast surgery      rt total mastectomy for breast CA  . Lipoma removal  11/07  . Bladder tack    . Colonoscopy     History  Substance Use Topics  . Smoking status: Former Smoker    Quit date: 01/03/1990  . Smokeless tobacco: Never Used  . Alcohol Use: 1.0 oz/week    2 drink(s) per week     Comment: social   Family History  Problem Relation Age of Onset  . Cancer Mother     stomach  . Hypertension Mother   . Heart disease Mother     CAD  . Stomach cancer Mother   . Cancer Father     lung  . Cancer Sister     colon, skin  . Heart disease Sister 34    MI  . Diabetes Sister   . Colon cancer Sister   . Heart disease Brother     HTN and MI  .  Hypertension Brother   . Esophageal cancer Neg Hx    Allergies  Allergen Reactions  . Sulfa Antibiotics    Current Outpatient Prescriptions on File Prior to Visit  Medication Sig Dispense Refill  . amLODipine (NORVASC) 10 MG tablet Take 1 tablet (10 mg total) by mouth daily.  90 tablet  3  . BIOTIN PO Take 1 tablet by mouth every evening.       . Calcium Carbonate-Vitamin D (CALTRATE 600+D PO) Take 1 tablet by mouth every evening.      Marland Kitchen EPINEPHrine (EPIPEN) 0.3 mg/0.3 mL SOAJ Inject 0.3 mLs (0.3 mg total) into the muscle as needed.  1 Device  0  . estradiol (ESTRACE VAGINAL) 0.1 MG/GM vaginal cream Insert 1/4 applicatorful of cream vaginally three times per week  14 g  11  . FLUoxetine (PROZAC) 20 MG capsule Take 20 mg by mouth daily.      . folic acid (FOLVITE) 1 MG tablet Take 1 mg by mouth daily.      Marland Kitchen MAGNESIUM PO Take 1 tablet by mouth daily as needed (only takes when working out, for muscle cramps).      . methotrexate (RHEUMATREX) 2.5 MG tablet Take 8 tablets by mouth once a week.       . metroNIDAZOLE (METROCREAM) 0.75 % cream  Apply topically 2 (two) times daily.  60 g  3  . Multiple Vitamin (MULTIVITAMIN) tablet Take 1 tablet by mouth daily.        Marland Kitchen oxyCODONE-acetaminophen (PERCOCET) 7.5-325 MG per tablet Take 1 tablet by mouth every 4 (four) hours as needed for pain.       No current facility-administered medications on file prior to visit.      Review of Systems Review of Systems  Constitutional: Negative for fever, appetite change, fatigue and unexpected weight change.  ENT pos for mouth burning feeling / neg for ST Eyes: Negative for pain and visual disturbance.  Respiratory: Negative for cough and shortness of breath.   Cardiovascular: Negative for cp or palpitations    Gastrointestinal: Negative for nausea, diarrhea and constipation.  Genitourinary: Negative for urgency and frequency.  Skin: Negative for pallor or rash pos for erythema/wound on R hand   Neurological: Negative for weakness, light-headedness, numbness and headaches.  Hematological: Negative for adenopathy. Does not bruise/bleed easily.  Psychiatric/Behavioral: Negative for dysphoric mood. The patient is not nervous/anxious.         Objective:   Physical Exam  Constitutional: She appears well-developed and well-nourished. No distress.  HENT:  Head: Normocephalic and atraumatic.  Mouth/Throat: Oropharynx is clear and moist.  Mouth is erythematous  Small area of white on tongue  Throat clear  No areas of blistering or ulceration  Lips are not involved   Eyes: Conjunctivae and EOM are normal. Pupils are equal, round, and reactive to light. No scleral icterus.  Neck: Normal range of motion. Neck supple.  Cardiovascular: Normal rate and regular rhythm.   Pulmonary/Chest: Effort normal and breath sounds normal.  Musculoskeletal: She exhibits no edema.  Lymphadenopathy:    She has no cervical adenopathy.  Neurological: She is alert.  Skin: Skin is warm and dry. There is erythema.  Erythema and scabbing of skin with a 2-3 mm open  area in web space between R thumb and index finger  This is at area of prior cellulitis Debrided and wound cx obtained   Paronychia at base of R index fingernail  Mildly swollen/ not fluctuant or draining  Tender  Psychiatric: She has a normal mood and affect.          Assessment & Plan:   Problem List Items Addressed This Visit     Digestive   Thrush     From recent abx likely  tx with nystatin solun/ mouthwash tid  Re check on wed    Relevant Medications      clindamycin (CLEOCIN) capsule      BACTROBAN 2 % EX OINT      nystatin (MYCOSTATIN) 100000 UNIT/ML suspension     Musculoskeletal and Integument   Paronychia of finger of right hand - Primary     Mild/small tx with clindamycin Warm compresses Re check on Wed  Will call if worse in the meantime    Relevant Medications      clindamycin (CLEOCIN) capsule      BACTROBAN 2 % EX OINT      nystatin (MYCOSTATIN) 100000 UNIT/ML suspension     Other   Open wound, hand     Prev tx with doxy and sulfa(worked but intol) Got better and than worse Trial of clinda  Wound cx obt - suspect it will be neg Warm comp Alert if worse     Relevant Orders      Wound culture

## 2013-06-17 NOTE — Assessment & Plan Note (Signed)
From recent abx likely  tx with nystatin solun/ mouthwash tid  Re check on wed

## 2013-06-17 NOTE — Assessment & Plan Note (Signed)
Mild/small tx with clindamycin Warm compresses Re check on Wed  Will call if worse in the meantime

## 2013-06-19 ENCOUNTER — Ambulatory Visit (INDEPENDENT_AMBULATORY_CARE_PROVIDER_SITE_OTHER): Payer: Managed Care, Other (non HMO) | Admitting: Family Medicine

## 2013-06-19 ENCOUNTER — Encounter: Payer: Self-pay | Admitting: Family Medicine

## 2013-06-19 VITALS — BP 128/72 | HR 60 | Temp 98.2°F | Wt 133.0 lb

## 2013-06-19 DIAGNOSIS — L03011 Cellulitis of right finger: Secondary | ICD-10-CM

## 2013-06-19 DIAGNOSIS — B370 Candidal stomatitis: Secondary | ICD-10-CM

## 2013-06-19 DIAGNOSIS — L03019 Cellulitis of unspecified finger: Secondary | ICD-10-CM

## 2013-06-19 NOTE — Patient Instructions (Signed)
Get on the schedule with me for Friday.  If you are better, then cancel the appointment.  If you are worse in the meantime, then let me know.  Keep going with the mouthwash and the antibiotics.   Take care.

## 2013-06-19 NOTE — Assessment & Plan Note (Signed)
Some better, continue nystatin for now and report back as needed.  She agrees.

## 2013-06-19 NOTE — Progress Notes (Signed)
Pre visit review using our clinic review tool, if applicable. No additional management support is needed unless otherwise documented below in the visit note.  Prev note from Dr. Glori Bickers reviewed, re: hand lesions and likely thrush.  cx pending in meantime.   Her mouth is some better, still a little irritated.  Taste is still altered.  Slow improvement with nystatin.  No fevers.    R hand is not sig better, no clearly worse. Web space of R hand with lesion and 2nd digit paronychia noted.  On clindamycin currently.  Hand lesions are sore to touch, about the same as prev, not usually sore w/o pressure applied.    Meds, vitals, and allergies reviewed.   ROS: See HPI.  Otherwise, noncontributory.  nad OP w/o erythema, no white lesions Erythema and scabbing of skin with a 2-3 mm open area in web space between R thumb and index finger  Paronychia at base of R index fingernail  Mildly swollen/ not fluctuant or draining  Not very tender. Nail bed is normal appearing.  DIP not involved.

## 2013-06-19 NOTE — Assessment & Plan Note (Signed)
Not worse, no need for I&D now.  Less tender than I would expect. This may have slower healing given the MTX hx.  I would plan on rechecking her Friday, she can cancel if improved.  She isn't systemically ill, clearly nontoxic, not worse from prev.  Would continue abx for now.  She agrees.

## 2013-06-21 ENCOUNTER — Ambulatory Visit: Payer: Managed Care, Other (non HMO) | Admitting: Family Medicine

## 2013-06-21 LAB — WOUND CULTURE
Gram Stain: NONE SEEN
Gram Stain: NONE SEEN

## 2013-06-28 ENCOUNTER — Ambulatory Visit: Payer: Managed Care, Other (non HMO) | Admitting: Family Medicine

## 2013-06-28 ENCOUNTER — Telehealth: Payer: Self-pay | Admitting: Family Medicine

## 2013-06-28 DIAGNOSIS — Z0289 Encounter for other administrative examinations: Secondary | ICD-10-CM

## 2013-06-28 NOTE — Telephone Encounter (Signed)
I think she already left the office. We need to get her checked, ie with PCP, when possible.  If it flares up when ON medicine, then I would stay off for now.  Please put her on for Monday if needed. Thanks.

## 2013-06-28 NOTE — Telephone Encounter (Signed)
Patient advised.  She is out of the medication and is scheduled to see you on Monday afternoon.

## 2013-06-28 NOTE — Telephone Encounter (Signed)
Pt came in at 2:15 pm 06/28/13, appointment was scheduled for 12:15 pm today, pt would like to Dr. Damita Dunnings to evaluate her oral antibiotic.  She is concerned that when she is ON the medication, the spot on her hand "flares up".  clindamycin (CLEOCIN) 300 MG capsule [[id number]]E Details.  Pharmacy of choice is [facility].  She is out of the medication.  Best number to call is [phone]

## 2013-07-01 ENCOUNTER — Encounter: Payer: Self-pay | Admitting: Family Medicine

## 2013-07-01 ENCOUNTER — Ambulatory Visit (INDEPENDENT_AMBULATORY_CARE_PROVIDER_SITE_OTHER): Payer: Managed Care, Other (non HMO) | Admitting: Family Medicine

## 2013-07-01 VITALS — BP 122/80 | HR 66 | Temp 97.5°F | Wt 133.5 lb

## 2013-07-01 DIAGNOSIS — L03019 Cellulitis of unspecified finger: Secondary | ICD-10-CM

## 2013-07-01 DIAGNOSIS — L03011 Cellulitis of right finger: Secondary | ICD-10-CM

## 2013-07-01 NOTE — Assessment & Plan Note (Signed)
Resolved, but with persistent keratotic lesion on the proximal 2nd finger.  Would like PCP input.  Unclear if delayed healing is related to MTX. Off abx currently.  No fevers.  Okay for outpatient f/u.  No charge for visit. Routed to PCP as FYI.

## 2013-07-01 NOTE — Patient Instructions (Signed)
No charge for visit.  Let me talk to [facility] in the meantime.   Take care.  Don't change anything for now.

## 2013-07-01 NOTE — Progress Notes (Signed)
Pre visit review using our clinic review tool, if applicable. No additional management support is needed unless otherwise documented below in the visit note.  Recheck R hand.  2nd paronychia resolved, not ttp.   Calloused lesion on proximal 2nd digit noted.  Some mild erythema locally, but no purulent discharge.  The keratotic superficial layer was trimmed at the OV, w/o pain.  No repetitive movements to cause such a lesion.   Meds, vitals, and allergies reviewed.   ROS: See HPI.  Otherwise, noncontributory.  nad R hand with 2nd paronychia resolved, not ttp.  Calloused lesion on proximal 2nd digit again noted.  Some mild erythema locally, but no purulent discharge.  The keratotic superficial layer was trimmed at the OV, w/o pain.   Distally nv intact

## 2013-07-02 ENCOUNTER — Telehealth: Payer: Self-pay | Admitting: Family Medicine

## 2013-07-02 DIAGNOSIS — L03011 Cellulitis of right finger: Secondary | ICD-10-CM

## 2013-07-02 DIAGNOSIS — L989 Disorder of the skin and subcutaneous tissue, unspecified: Secondary | ICD-10-CM | POA: Insufficient documentation

## 2013-07-02 NOTE — Telephone Encounter (Signed)
Hand specialist referral

## 2013-07-02 NOTE — Telephone Encounter (Signed)
Message copied by Abner Greenspan on Tue Jul 02, 2013  5:37 PM ------      Message from: Tonia Ghent      Created: Tue Jul 02, 2013  8:19 AM       I think we were going to call her.  I don't know what the lesion is. I told her the referral was likely.  Will you put it in so it'll come back to you?  Thanks.             Brigitte Pulse            ----- Message -----         From: Abner Greenspan, MD         Sent: 07/02/2013   8:05 AM           To: Tonia Ghent, MD            If she is still having problems - she probably needs to see a hand specialist -- I read your note, was she to call if no further improvement?      Thanks for seeing her       ----- Message -----         From: Tonia Ghent, MD         Sent: 07/01/2013  10:33 PM           To: Abner Greenspan, MD            Talk to me.  Hand lesion?      Thanks.              ------

## 2013-10-11 ENCOUNTER — Other Ambulatory Visit: Payer: Self-pay | Admitting: Family Medicine

## 2013-10-17 ENCOUNTER — Other Ambulatory Visit: Payer: Self-pay | Admitting: *Deleted

## 2013-10-17 MED ORDER — FLUOXETINE HCL 20 MG PO CAPS: ORAL_CAPSULE | ORAL | Status: DC

## 2013-10-17 NOTE — Telephone Encounter (Signed)
done

## 2013-10-17 NOTE — Telephone Encounter (Signed)
Please refill for 6 mo 

## 2013-10-17 NOTE — Telephone Encounter (Signed)
Fax refill request, Pharmacy said last refilled on 10/29/12 from mail order pharmacy, please advise

## 2013-10-30 ENCOUNTER — Ambulatory Visit: Payer: Managed Care, Other (non HMO)

## 2013-10-31 ENCOUNTER — Ambulatory Visit (INDEPENDENT_AMBULATORY_CARE_PROVIDER_SITE_OTHER): Payer: Managed Care, Other (non HMO)

## 2013-10-31 DIAGNOSIS — Z23 Encounter for immunization: Secondary | ICD-10-CM

## 2013-11-13 LAB — HM DEXA SCAN

## 2013-11-19 ENCOUNTER — Encounter: Payer: Self-pay | Admitting: Family Medicine

## 2013-11-21 ENCOUNTER — Encounter: Payer: Self-pay | Admitting: Family Medicine

## 2013-11-21 ENCOUNTER — Encounter: Payer: Self-pay | Admitting: *Deleted

## 2014-01-07 ENCOUNTER — Other Ambulatory Visit: Payer: Self-pay | Admitting: Family Medicine

## 2014-01-07 NOTE — Telephone Encounter (Signed)
Please schedule f/u or PE in the summer (her pref) and refill until then Thanks

## 2014-01-07 NOTE — Telephone Encounter (Signed)
Electronic refill request, no recent/future appt., please advise  

## 2014-01-09 NOTE — Telephone Encounter (Signed)
appt scheduled and med refilled (pt said she didn't need CPE so f/u appt scheduled)

## 2014-04-28 ENCOUNTER — Encounter: Payer: Self-pay | Admitting: Family Medicine

## 2014-04-28 ENCOUNTER — Ambulatory Visit (INDEPENDENT_AMBULATORY_CARE_PROVIDER_SITE_OTHER): Payer: PPO | Admitting: Family Medicine

## 2014-04-28 VITALS — BP 136/78 | HR 47 | Temp 98.4°F | Ht 61.0 in | Wt 135.5 lb

## 2014-04-28 DIAGNOSIS — R5383 Other fatigue: Secondary | ICD-10-CM | POA: Insufficient documentation

## 2014-04-28 DIAGNOSIS — N3000 Acute cystitis without hematuria: Secondary | ICD-10-CM | POA: Diagnosis not present

## 2014-04-28 DIAGNOSIS — R110 Nausea: Secondary | ICD-10-CM

## 2014-04-28 DIAGNOSIS — Z23 Encounter for immunization: Secondary | ICD-10-CM | POA: Diagnosis not present

## 2014-04-28 DIAGNOSIS — N390 Urinary tract infection, site not specified: Secondary | ICD-10-CM | POA: Insufficient documentation

## 2014-04-28 DIAGNOSIS — R5382 Chronic fatigue, unspecified: Secondary | ICD-10-CM | POA: Diagnosis not present

## 2014-04-28 LAB — COMPREHENSIVE METABOLIC PANEL
ALT: 33 U/L (ref 0–35)
AST: 32 U/L (ref 0–37)
Albumin: 4.6 g/dL (ref 3.5–5.2)
Alkaline Phosphatase: 65 U/L (ref 39–117)
BUN: 26 mg/dL — AB (ref 6–23)
CHLORIDE: 100 meq/L (ref 96–112)
CO2: 31 meq/L (ref 19–32)
Calcium: 10.8 mg/dL — ABNORMAL HIGH (ref 8.4–10.5)
Creatinine, Ser: 1.23 mg/dL — ABNORMAL HIGH (ref 0.40–1.20)
GFR: 45.24 mL/min — ABNORMAL LOW (ref >60.00)
GLUCOSE: 88 mg/dL (ref 70–99)
Potassium: 4.3 mEq/L (ref 3.5–5.1)
Sodium: 137 mEq/L (ref 135–145)
Total Bilirubin: 0.3 mg/dL (ref 0.2–1.2)
Total Protein: 7.8 g/dL (ref 6.0–8.3)

## 2014-04-28 LAB — POCT URINALYSIS DIPSTICK
Bilirubin, UA: NEGATIVE
Blood, UA: NEGATIVE
GLUCOSE UA: NEGATIVE
Ketones, UA: POSITIVE
Nitrite, UA: NEGATIVE
PH UA: 6
PROTEIN UA: POSITIVE
Spec Grav, UA: 1.025
UROBILINOGEN UA: 0.2

## 2014-04-28 LAB — CBC WITH DIFFERENTIAL/PLATELET
Basophils Absolute: 0 10*3/uL (ref 0.0–0.1)
Basophils Relative: 0.6 % (ref 0.0–3.0)
EOS ABS: 0.1 10*3/uL (ref 0.0–0.7)
Eosinophils Relative: 1.2 % (ref 0.0–5.0)
HCT: 36.3 % (ref 36.0–46.0)
Hemoglobin: 12.1 g/dL (ref 12.0–15.0)
Lymphocytes Relative: 28.9 % (ref 12.0–46.0)
Lymphs Abs: 2.6 10*3/uL (ref 0.7–4.0)
MCHC: 33.3 g/dL (ref 30.0–36.0)
MCV: 86.8 fl (ref 78.0–100.0)
Monocytes Absolute: 0.6 10*3/uL (ref 0.1–1.0)
Monocytes Relative: 6.5 % (ref 3.0–12.0)
Neutro Abs: 5.6 10*3/uL (ref 1.4–7.7)
Neutrophils Relative %: 62.8 % (ref 43.0–77.0)
Platelets: 300 10*3/uL (ref 150.0–400.0)
RBC: 4.18 Mil/uL (ref 3.87–5.11)
RDW: 15.3 % (ref 11.5–15.5)
WBC: 8.8 10*3/uL (ref 4.0–10.5)

## 2014-04-28 LAB — VITAMIN B12: Vitamin B-12: 495 pg/mL (ref 211–911)

## 2014-04-28 LAB — TSH: TSH: 3.07 u[IU]/mL (ref 0.35–4.50)

## 2014-04-28 NOTE — Assessment & Plan Note (Signed)
Lab and ua today Reassuring exam  Today symptoms are almost gone Disc poss of virus

## 2014-04-28 NOTE — Progress Notes (Signed)
Pre visit review using our clinic review tool, if applicable. No additional management support is needed unless otherwise documented below in the visit note. 

## 2014-04-28 NOTE — Assessment & Plan Note (Signed)
With nausea that resolved ua pos for leukocytes-sent for cx Disc poss of sleep apnea or other causes Lab today

## 2014-04-28 NOTE — Progress Notes (Signed)
Subjective:    Patient ID: Pamela Morales, female    DOB: 05-13-1940, 74 y.o.   MRN: [id number]  HPI Here for fatigue and nausea   For 2-4 weeks -waves of nausea - never vomited  Episodes that came and went  This is getting better -overall , last episode was last Thursday   For the last month- sort of lethargic and "dragging"  Still going to the gym -- really wipes her out   Not a lot of energy to work in the yard -esp in evening   No fever (does get sweats from menopause)  No new medicines  Has been on percocet for a while  No nsaids at all   ? Perhaps some cold symptoms - a few months ago   Some throat clearing and post nasal drip but no cough  No ST or ear pain   No rash   Gets regular labs for her methotrexate   No heartburn  No burping or regurgitation  No diarrhea or constipation or blood in stool   Had ccy in the past   No urinary symptoms / frequency or dysuria  Patient Active Problem List   Diagnosis Date Noted  . Lesion of finger 07/02/2013  . Paronychia of finger of right hand 06/17/2013  . Open wound, hand 06/17/2013  . Thrush 06/17/2013  . Family history of colon cancer 01/01/2013  . Fecal occult blood test positive 01/01/2013  . Personal history of colonic polyps 01/01/2013  . Occult blood positive stool 12/27/2012  . Anemia 12/19/2012  . Colon cancer screening 12/19/2012  . Swelling of joint of right wrist 10/23/2012  . Right wrist pain 10/19/2012  . Left knee pain 10/19/2012  . Hearing loss 09/19/2012  . Tonsillith 09/19/2012  . 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 Sleep disorder 01/07/2011  . Colon polyps 07/16/2010  . BACK PAIN WITH RADICULOPATHY 03/15/2007  . History of herpes zoster 02/23/2007  . ESSENTIAL HYPERTENSION 02/05/2007  . OSTEOPENIA 02/05/2007  . ECZEMA, ATOPIC 01/29/2007  . ROSACEA 01/29/2007  . BASAL CELL CARCINOMA, HX OF 01/29/2007   Past Medical History  Diagnosis Date  . Depression   .  Osteopenia   . Eczema   . Hypertension   . Rosacea   . Zoster 02/09  . 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 Adenomatous polyp of colon   . 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 CA - cancer of ovary    Past Surgical History  Procedure Laterality Date  . Cholecystectomy  08/1996    10 yrs ago  . Abdominal hysterectomy  1996    BSO fibroids, incidental ovarian CA finding  . Breast surgery      rt total mastectomy for breast CA  . Lipoma removal  11/07  . Bladder tack    . Colonoscopy     History  Substance Use Topics  . Smoking status: Former Smoker    Quit date: 01/03/1990  . Smokeless tobacco: Never Used  . Alcohol Use: No   Family History  Problem Relation Age of Onset  . Cancer Mother     stomach  . Hypertension Mother   . Heart disease Mother     CAD  . Stomach cancer Mother   . Cancer Father     lung  . Cancer Sister     colon, skin  .  Heart disease Sister 57    MI  . Diabetes Sister   . Colon cancer Sister   . Heart disease Brother     HTN and MI  . Hypertension Brother   . Esophageal cancer Neg Hx    Allergies  Allergen Reactions  . Sulfa Antibiotics    Current Outpatient Prescriptions on File Prior to Visit  Medication Sig Dispense Refill  . amLODipine (NORVASC) 10 MG tablet TAKE 1 TABLET BY MOUTH EVERY DAY 90 tablet 1  . BIOTIN PO Take 1 tablet by mouth every evening.     . Calcium Carbonate-Vitamin D (CALTRATE 600+D PO) Take 1 tablet by mouth every evening.    Marland Kitchen EPINEPHrine (EPIPEN) 0.3 mg/0.3 mL SOAJ Inject 0.3 mLs (0.3 mg total) into the muscle as needed. 1 Device 0  . FLUoxetine (PROZAC) 20 MG capsule Take 1-2 capsules daily 60 capsule 5  . folic acid (FOLVITE) 1 MG tablet Take 1 mg by mouth daily.    Marland Kitchen MAGNESIUM PO Take 1 tablet by mouth daily as needed (only takes when working out, for muscle cramps).    . methotrexate (RHEUMATREX) 2.5 MG tablet Take 8 tablets by mouth once a week.     .  metroNIDAZOLE (METROCREAM) 0.75 % cream Apply topically 2 (two) times daily. 60 g 3  . Multiple Vitamin (MULTIVITAMIN) tablet Take 1 tablet by mouth daily.      Marland Kitchen oxyCODONE-acetaminophen (PERCOCET) 7.5-325 MG per tablet Take 1 tablet by mouth every 4 (four) hours as needed for pain.     No current facility-administered medications on file prior to visit.     Review of Systems Review of Systems  Constitutional: Negative for fever, appetite change,  and unexpected weight change.  Eyes: Negative for pain and visual disturbance.  Respiratory: Negative for cough and shortness of breath.   Cardiovascular: Negative for cp or palpitations    Gastrointestinal: Negative for vomiting , diarrhea and constipation. neg for abd pain  Genitourinary: Negative for urgency and frequency. neg for dysuria or hematuria  Skin: Negative for pallor or rash   Neurological: Negative for weakness, light-headedness, numbness and headaches.  Hematological: Negative for adenopathy. Does not bruise/bleed easily.  Psychiatric/Behavioral: Negative for dysphoric mood. The patient is not nervous/anxious.         Objective:   Physical Exam  Constitutional: She appears well-developed and well-nourished. No distress.  HENT:  Head: Normocephalic and atraumatic.  Right Ear: External ear normal.  Left Ear: External ear normal.  Nose: Nose normal.  Mouth/Throat: Oropharynx is clear and moist.  Eyes: Conjunctivae and EOM are normal. Pupils are equal, round, and reactive to light. Right eye exhibits no discharge. Left eye exhibits no discharge. No scleral icterus.  Neck: Normal range of motion. Neck supple. No JVD present. Carotid bruit is not present. No thyromegaly present.  Cardiovascular: Normal rate, regular rhythm, normal heart sounds and intact distal pulses.  Exam reveals no gallop.   Pulmonary/Chest: Effort normal and breath sounds normal. No respiratory distress. She has no wheezes. She has no rales.  Abdominal:  Soft. Bowel sounds are normal. She exhibits no distension and no mass. There is no tenderness. There is no rebound and no guarding.  No suprapubic tenderness or fullness   No cva tenderness   Musculoskeletal: She exhibits no edema or tenderness.  Lymphadenopathy:    She has no cervical adenopathy.  Neurological: She is alert. She has normal reflexes. No cranial nerve deficit. She exhibits normal muscle tone. Coordination normal.  Skin: Skin is warm and dry. No rash noted. No erythema. No pallor.  Psychiatric: She has a normal mood and affect.          Assessment & Plan:   Problem List Items Addressed This Visit      Genitourinary   UTI (urinary tract infection)    Mod leukocytes on ua  Sent for cx  Will tx if pos  No urinary symptoms but some fatigue and nausea       Relevant Orders   Urine culture     Other   Fatigue - Primary    With nausea that resolved ua pos for leukocytes-sent for cx Disc poss of sleep apnea or other causes Lab today      Relevant Orders   CBC with Differential/Platelet (Completed)   Comprehensive metabolic panel (Completed)   TSH (Completed)   Vitamin B12 (Completed)   Urinalysis Dipstick (Completed)   Nausea without vomiting    Lab and ua today Reassuring exam  Today symptoms are almost gone Disc poss of virus       Relevant Orders   CBC with Differential/Platelet (Completed)   Urinalysis Dipstick (Completed)   Urine culture

## 2014-04-28 NOTE — Assessment & Plan Note (Signed)
Mod leukocytes on ua  Sent for cx  Will tx if pos  No urinary symptoms but some fatigue and nausea

## 2014-04-28 NOTE — Patient Instructions (Addendum)
Labs and urine test today  Drink fluids and eat regular meals  If your nausea returns please let me know  If no improvement - we may want to work you up for sleep apnea

## 2014-04-30 LAB — URINE CULTURE: Colony Count: 40000

## 2014-05-01 ENCOUNTER — Telehealth: Payer: Self-pay | Admitting: Family Medicine

## 2014-05-01 MED ORDER — AMOXICILLIN 500 MG PO CAPS: 500.0000 mg | ORAL_CAPSULE | Freq: Three times a day (TID) | ORAL | Status: DC

## 2014-05-01 NOTE — Telephone Encounter (Signed)
Patient notified of results.

## 2014-05-01 NOTE — Telephone Encounter (Signed)
Urine cx pos for group B stresp -this may or may not be causing some of her symptoms  Please call in amoxicillin-take it with food Update if she does not feel better after that  Do not take this at the same time as your methotrexate

## 2014-05-01 NOTE — Telephone Encounter (Signed)
Left message with family requesting pt to call office back 

## 2014-05-02 ENCOUNTER — Telehealth: Payer: Self-pay | Admitting: Family Medicine

## 2014-05-02 MED ORDER — AMOXICILLIN 500 MG PO CAPS: 500.0000 mg | ORAL_CAPSULE | Freq: Three times a day (TID) | ORAL | Status: DC

## 2014-05-02 NOTE — Telephone Encounter (Signed)
Rx wasn't sent in so I sent it in today and advise pt

## 2014-05-02 NOTE — Telephone Encounter (Signed)
Patient need medication for kidney infection sent to [facility] [place]

## 2014-05-02 NOTE — Telephone Encounter (Signed)
I think it was sent - ? Diff pharmacy  See last phone note-amox

## 2014-05-10 ENCOUNTER — Other Ambulatory Visit: Payer: Self-pay | Admitting: Family Medicine

## 2014-05-26 ENCOUNTER — Encounter: Payer: Self-pay | Admitting: Family Medicine

## 2014-06-07 ENCOUNTER — Other Ambulatory Visit: Payer: Self-pay | Admitting: Family Medicine

## 2014-06-09 NOTE — Telephone Encounter (Signed)
done

## 2014-06-09 NOTE — Telephone Encounter (Signed)
Please refill for a year  

## 2014-06-09 NOTE — Telephone Encounter (Signed)
Electronic refill request, last refilled on 05/12/14 #60 with 0 refills, please advise

## 2014-06-10 ENCOUNTER — Ambulatory Visit: Payer: Managed Care, Other (non HMO) | Admitting: Family Medicine

## 2014-07-07 ENCOUNTER — Other Ambulatory Visit: Payer: Self-pay | Admitting: Family Medicine

## 2014-08-01 ENCOUNTER — Encounter: Payer: Self-pay | Admitting: Gastroenterology

## 2014-10-04 ENCOUNTER — Other Ambulatory Visit: Payer: Self-pay | Admitting: Family Medicine

## 2014-10-10 ENCOUNTER — Ambulatory Visit (INDEPENDENT_AMBULATORY_CARE_PROVIDER_SITE_OTHER): Payer: PPO

## 2014-10-10 DIAGNOSIS — Z23 Encounter for immunization: Secondary | ICD-10-CM

## 2015-01-08 DIAGNOSIS — L4050 Arthropathic psoriasis, unspecified: Secondary | ICD-10-CM | POA: Diagnosis not present

## 2015-01-08 DIAGNOSIS — L409 Psoriasis, unspecified: Secondary | ICD-10-CM | POA: Diagnosis not present

## 2015-01-08 DIAGNOSIS — Z79899 Other long term (current) drug therapy: Secondary | ICD-10-CM | POA: Diagnosis not present

## 2015-01-09 ENCOUNTER — Ambulatory Visit (INDEPENDENT_AMBULATORY_CARE_PROVIDER_SITE_OTHER): Payer: PPO | Admitting: Family Medicine

## 2015-01-09 ENCOUNTER — Encounter: Payer: Self-pay | Admitting: Family Medicine

## 2015-01-09 ENCOUNTER — Ambulatory Visit (INDEPENDENT_AMBULATORY_CARE_PROVIDER_SITE_OTHER)
Admission: RE | Admit: 2015-01-09 | Discharge: 2015-01-09 | Disposition: A | Discharge status: Home / Self Care | Payer: PPO | Source: Ambulatory Visit | Attending: Family Medicine | Admitting: Family Medicine

## 2015-01-09 VITALS — BP 140/80 | HR 54 | Temp 98.1°F | Ht 61.0 in | Wt 139.0 lb

## 2015-01-09 DIAGNOSIS — R05 Cough: Secondary | ICD-10-CM | POA: Diagnosis not present

## 2015-01-09 DIAGNOSIS — R062 Wheezing: Secondary | ICD-10-CM | POA: Insufficient documentation

## 2015-01-09 MED ORDER — PREDNISONE 10 MG PO TABS: ORAL_TABLET | ORAL | Status: DC

## 2015-01-09 MED ORDER — ALBUTEROL SULFATE HFA 108 (90 BASE) MCG/ACT IN AERS: 2.0000 | INHALATION_SPRAY | RESPIRATORY_TRACT | Status: DC | PRN

## 2015-01-09 NOTE — Patient Instructions (Signed)
Use the inhaler 2 puffs up to every 4 hours as needed  If you feel heartburn- it can also cause wheeze and cough- get zantac 150 mg otc and take twice daily  Take the prednisone as directed  Chest xray today    Update if not starting to improve in a week or if worsening

## 2015-01-09 NOTE — Progress Notes (Signed)
Pre visit review using our clinic review tool, if applicable. No additional management support is needed unless otherwise documented below in the visit note. 

## 2015-01-09 NOTE — Progress Notes (Signed)
Subjective:    Patient ID: Pamela Morales, female    DOB: [birth year], 75 y.o.   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 Wheezing 01/09/2015  . Fatigue 04/28/2014  . Nausea without vomiting 04/28/2014  . UTI (urinary tract infection) 04/28/2014  . Lesion of finger 07/02/2013  . Paronychia of finger of right hand 06/17/2013  . Open wound, hand 06/17/2013  . Thrush 06/17/2013  . Family history of colon cancer 01/01/2013  . Fecal occult blood test positive 01/01/2013  . Personal history of colonic polyps 01/01/2013  . Occult blood positive stool 12/27/2012  . Anemia 12/19/2012  . Colon cancer screening 12/19/2012  . Swelling of joint of right wrist 10/23/2012  . Right wrist pain 10/19/2012  . Left knee pain 10/19/2012  . Hearing loss 09/19/2012  . Tonsillith 09/19/2012  . 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 Sleep disorder 01/07/2011  . Colon polyps 07/16/2010  . BACK PAIN WITH RADICULOPATHY 03/15/2007  . History of herpes zoster 02/23/2007  . ESSENTIAL HYPERTENSION 02/05/2007  . OSTEOPENIA 02/05/2007  . ECZEMA, ATOPIC 01/29/2007  . ROSACEA 01/29/2007  . BASAL CELL CARCINOMA, HX OF 01/29/2007   Past Medical History  Diagnosis Date  . Depression   . Osteopenia   . Eczema   . Hypertension   . Rosacea   . Zoster 02/09  . 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 Adenomatous polyp of colon   . Cancer ([place]) 02/10    R breast   (est rec neg and brac neg)  . CA - cancer of ovary    Past Surgical History  Procedure Laterality Date  . Cholecystectomy  08/1996    10 yrs ago  . Abdominal hysterectomy  1996    BSO fibroids, incidental ovarian CA finding  . Breast surgery      rt total mastectomy for breast CA  . Lipoma removal  11/07  . Bladder tack    . Colonoscopy     Social History  Substance Use Topics  . Smoking status: Former Smoker    Quit date: 01/03/1990  . Smokeless tobacco: Never Used  . Alcohol Use: No   Family History  Problem Relation Age of Onset  . Cancer Mother     stomach  . Hypertension Mother   . Heart disease Mother     CAD  . Stomach cancer Mother   . Cancer Father     lung  . Cancer Sister     colon, skin  . Heart disease Sister 84    MI  . Diabetes Sister   . Colon cancer Sister   . Heart disease Brother     HTN and MI  . Hypertension Brother   . Esophageal cancer Neg Hx  Allergies  Allergen Reactions  . Sulfa Antibiotics    Current Outpatient Prescriptions on File Prior to Visit  Medication Sig Dispense Refill  . amLODipine (NORVASC) 10 MG tablet TAKE 1 TABLET BY MOUTH EVERY DAY 90 tablet 1  . BIOTIN PO Take 1 tablet by mouth every evening.     . Calcium Carbonate-Vitamin D (CALTRATE 600+D PO) Take 1 tablet by mouth every evening.    Marland Kitchen FLUoxetine (PROZAC) 20 MG capsule TAKE 1-2 CAPSULES DAILY 60 capsule 11  . folic acid (FOLVITE) 1 MG tablet Take 1 mg by mouth daily.    Marland Kitchen MAGNESIUM PO Take 1 tablet by mouth daily as needed (only takes when working out, for muscle cramps).    . methotrexate (RHEUMATREX) 2.5 MG tablet Take 8 tablets by mouth once a week.     . metroNIDAZOLE (METROCREAM) 0.75 % cream Apply topically 2 (two) times daily. 60 g 3  . Multiple Vitamin (MULTIVITAMIN) tablet Take 1 tablet by mouth daily.      Marland Kitchen EPINEPHrine (EPIPEN) 0.3 mg/0.3 mL SOAJ Inject 0.3 mLs (0.3 mg total) into the muscle as needed. (Patient not taking: Reported on 01/09/2015) 1  Device 0   No current facility-administered medications on file prior to visit.    Review of Systems Review of Systems  Constitutional: Negative for fever, appetite change, fatigue and unexpected weight change.  Eyes: Negative for pain and visual disturbance.  ENT neg for sinus pain or congestion or st  Respiratory: Negative for  shortness of breath.   Cardiovascular: Negative for cp or palpitations    Gastrointestinal: Negative for nausea, diarrhea and constipation. neg for gerd symptoms  Genitourinary: Negative for urgency and frequency.  Skin: Negative for pallor or rash   Neurological: Negative for weakness, light-headedness, numbness and headaches.  Hematological: Negative for adenopathy. Does not bruise/bleed easily.  Psychiatric/Behavioral: Negative for dysphoric mood. The patient is not nervous/anxious.         Objective:   Physical Exam  Constitutional: She appears well-developed and well-nourished. No distress.  HENT:  Head: Normocephalic and atraumatic.  Right Ear: External ear normal.  Left Ear: External ear normal.  Mouth/Throat: Oropharynx is clear and moist. No oropharyngeal exudate.  Mildly boggy nares No sinus tenderness No post nasal drainage Pt does clear her throat often  Eyes: Conjunctivae and EOM are normal. Pupils are equal, round, and reactive to light.  Neck: Normal range of motion. Neck supple. No JVD present. Carotid bruit is not present. No thyromegaly present.  Cardiovascular: Normal rate, regular rhythm, normal heart sounds and intact distal pulses.  Exam reveals no gallop.   Pulmonary/Chest: Effort normal. No respiratory distress. She has wheezes. She has no rales.  No crackles  Diffuse exp wheezes worse on forced exp Nl exp time  No rales or rhonchi or dullness   Abdominal: Soft. Bowel sounds are normal. She exhibits no distension, no abdominal bruit and no mass. There is no tenderness.  Musculoskeletal: She exhibits no edema.    Lymphadenopathy:    She has no cervical adenopathy.  Neurological: She is alert. She has normal reflexes.  Skin: Skin is warm and dry. No rash noted.  Psychiatric: She has a normal mood and affect.          Assessment & Plan:   Problem List Items Addressed This Visit      Other   Wheezing - Primary    With cough and throat clearing -every since cleaning a dusy/moldy house this summer and fall  Some allergy symptoms occ  No heartburn on a regular basis  cxr today  Trial of prednisone taper 30 mg  Also albuterol mdi with inst for use- prn  Update if not starting to improve in a week or if worsening        Relevant Orders   DG Chest 2 View

## 2015-01-09 NOTE — Assessment & Plan Note (Signed)
With cough and throat clearing -every since cleaning a dusy/moldy house this summer and fall Some allergy symptoms occ  No heartburn on a regular basis  cxr today  Trial of prednisone taper 30 mg  Also albuterol mdi with inst for use- prn  Update if not starting to improve in a week or if worsening

## 2015-02-13 ENCOUNTER — Ambulatory Visit (INDEPENDENT_AMBULATORY_CARE_PROVIDER_SITE_OTHER): Payer: PPO | Admitting: Family Medicine

## 2015-02-13 ENCOUNTER — Encounter: Payer: Self-pay | Admitting: Family Medicine

## 2015-02-13 VITALS — BP 136/84 | HR 58 | Temp 98.1°F | Ht 61.0 in | Wt 139.2 lb

## 2015-02-13 DIAGNOSIS — R062 Wheezing: Secondary | ICD-10-CM

## 2015-02-13 DIAGNOSIS — H52223 Regular astigmatism, bilateral: Secondary | ICD-10-CM | POA: Diagnosis not present

## 2015-02-13 DIAGNOSIS — I10 Essential (primary) hypertension: Secondary | ICD-10-CM

## 2015-02-13 DIAGNOSIS — H524 Presbyopia: Secondary | ICD-10-CM | POA: Diagnosis not present

## 2015-02-13 DIAGNOSIS — H5203 Hypermetropia, bilateral: Secondary | ICD-10-CM | POA: Diagnosis not present

## 2015-02-13 DIAGNOSIS — H2589 Other age-related cataract: Secondary | ICD-10-CM | POA: Diagnosis not present

## 2015-02-13 MED ORDER — ALBUTEROL SULFATE (2.5 MG/3ML) 0.083% IN NEBU
2.5000 mg | INHALATION_SOLUTION | Freq: Once | RESPIRATORY_TRACT | Status: AC
  Administered 2015-02-13: 2.5 mg via RESPIRATORY_TRACT

## 2015-02-13 MED ORDER — FLUTICASONE-SALMETEROL 100-50 MCG/DOSE IN AEPB: 1.0000 | INHALATION_SPRAY | Freq: Two times a day (BID) | RESPIRATORY_TRACT | Status: DC

## 2015-02-13 MED ORDER — AZITHROMYCIN 250 MG PO TABS: ORAL_TABLET | ORAL | Status: DC

## 2015-02-13 NOTE — Progress Notes (Signed)
Pre visit review using our clinic review tool, if applicable. No additional management support is needed unless otherwise documented below in the visit note. 

## 2015-02-13 NOTE — Progress Notes (Signed)
Subjective:    Patient ID: Pamela Morales, female    DOB: [birth year], 75 y.o.   MRN: [id number]  HPI Seen last visit for chest congestion   Given prednisone and albuterol  Nl cxr   Still wheezing some but not as much  Using the albuterol inhaler - usually at night - does not seem to help much  Cough - is prod - fair amt of mucous- kind of grey in color  Actually coughing more than she was   Throat is sore Some post nasal drainage Taking zyrtec 10 mg at night - may help a bit   Just feels tight in chest   bp is stable today  No cp or palpitations or headaches or edema  No side effects to medicines  BP Readings from Last 3 Encounters:  02/13/15 136/84  01/09/15 140/80  04/28/14 136/78      Is on methotrexate- on it for a long time 2-3 years    No fever No sinus pain    Patient Active Problem List   Diagnosis Date Noted  . Wheezing 01/09/2015  . Fatigue 04/28/2014  . Nausea without vomiting 04/28/2014  . UTI (urinary tract infection) 04/28/2014  . Lesion of finger 07/02/2013  . Paronychia of finger of right hand 06/17/2013  . Open wound, hand 06/17/2013  . Thrush 06/17/2013  . Family history of colon cancer 01/01/2013  . Fecal occult blood test positive 01/01/2013  . Personal history of colonic polyps 01/01/2013  . Occult blood positive stool 12/27/2012  . Anemia 12/19/2012  . Colon cancer screening 12/19/2012  . Swelling of joint of right wrist 10/23/2012  . Right wrist pain 10/19/2012  . Left knee pain 10/19/2012  . Hearing loss 09/19/2012  . Tonsillith 09/19/2012  . 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 Sleep disorder 01/07/2011  . Colon polyps 07/16/2010  . BACK PAIN WITH RADICULOPATHY 03/15/2007  . History of herpes zoster 02/23/2007  . Essential hypertension 02/05/2007  . OSTEOPENIA 02/05/2007  . ECZEMA, ATOPIC 01/29/2007  . ROSACEA 01/29/2007  . BASAL CELL CARCINOMA, HX OF 01/29/2007   Past Medical History  Diagnosis Date  .  Depression   . Osteopenia   . Eczema   . Hypertension   . Rosacea   . Zoster 02/09  . 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 Adenomatous polyp of colon   . Cancer ([place]) 02/10    R breast  (est rec neg and brac neg)  . CA - cancer of ovary    Past Surgical History  Procedure Laterality Date  . Cholecystectomy  08/1996    10 yrs ago  . Abdominal hysterectomy  1996    BSO fibroids, incidental ovarian CA finding  . Breast surgery      rt total mastectomy for breast CA  . Lipoma removal  11/07  . Bladder tack    . Colonoscopy     Social History  Substance Use Topics  . Smoking status: Former Smoker    Quit date: 01/03/1990  . Smokeless tobacco: Never Used  . Alcohol Use: No   Family History  Problem Relation Age of Onset  . Cancer Mother     stomach  . Hypertension Mother   . Heart disease Mother     CAD  . Stomach cancer Mother   . Cancer Father     lung  . Cancer Sister     colon, skin  .  Heart disease Sister 61    MI  . Diabetes Sister   . Colon cancer Sister   . Heart disease Brother     HTN and MI  . Hypertension Brother   . Esophageal cancer Neg Hx    Allergies  Allergen Reactions  . Sulfa Antibiotics    Current Outpatient Prescriptions on File Prior to Visit  Medication Sig Dispense Refill  . albuterol (PROVENTIL HFA;VENTOLIN HFA) 108 (90 Base) MCG/ACT inhaler Inhale 2 puffs into the lungs every 4 (four) hours as needed for wheezing. 1 Inhaler 1  . amLODipine (NORVASC) 10 MG tablet TAKE 1 TABLET BY MOUTH EVERY DAY 90 tablet 1  . BIOTIN PO Take 1 tablet by mouth every evening.     . Calcium Carbonate-Vitamin D (CALTRATE 600+D PO) Take 1 tablet by mouth every evening.    Marland Kitchen FLUoxetine (PROZAC) 20 MG capsule TAKE 1-2 CAPSULES DAILY 60 capsule 11  . folic acid (FOLVITE) 1 MG tablet Take 1 mg by mouth daily.    Marland Kitchen MAGNESIUM PO Take 1 tablet by mouth daily as needed (only takes when working out, for muscle cramps).    .  methotrexate (RHEUMATREX) 2.5 MG tablet Take 8 tablets by mouth once a week.     . metroNIDAZOLE (METROCREAM) 0.75 % cream Apply topically 2 (two) times daily. 60 g 3  . Multiple Vitamin (MULTIVITAMIN) tablet Take 1 tablet by mouth daily.       No current facility-administered medications on file prior to visit.    Review of Systems Review of Systems  Constitutional: Negative for fever, appetite change,  and unexpected weight change.  ENT pos for occ pnd Eyes: Negative for pain and visual disturbance.  Respiratory: Negative for  shortness of breath.  pos for cough and wheeze  Cardiovascular: Negative for cp or palpitations    Gastrointestinal: Negative for nausea, diarrhea and constipation.  Genitourinary: Negative for urgency and frequency.  Skin: Negative for pallor or rash   Neurological: Negative for weakness, light-headedness, numbness and headaches.  Hematological: Negative for adenopathy. Does not bruise/bleed easily.  Psychiatric/Behavioral: Negative for dysphoric mood. The patient is not nervous/anxious.         Objective:   Physical Exam  Constitutional: She appears well-developed and well-nourished. No distress.  HENT:  Head: Normocephalic and atraumatic.  Right Ear: External ear normal.  Left Ear: External ear normal.  Mouth/Throat: Oropharynx is clear and moist. No oropharyngeal exudate.  Nares are boggy No sinus tenderness  Eyes: Conjunctivae and EOM are normal. Pupils are equal, round, and reactive to light. Right eye exhibits no discharge. Left eye exhibits no discharge.  Neck: Normal range of motion. Neck supple.  Cardiovascular: Normal rate and regular rhythm.   Pulmonary/Chest: Effort normal. No respiratory distress. She has wheezes. She has no rales. She exhibits no tenderness.  Diff exp wheezes No prolonged exp phase  No rales or rhonchi   Lymphadenopathy:    She has no cervical adenopathy.  Neurological: She is alert.  Skin: Skin is warm and dry. No  rash noted. No erythema. No pallor.  Psychiatric: She has a normal mood and affect.          Assessment & Plan:   Problem List Items Addressed This Visit      Cardiovascular and Mediastinum   Essential hypertension - Primary    bp in fair control at this time  BP Readings from Last 1 Encounters:  02/13/15 136/84   No changes needed Disc lifstyle change  with low sodium diet and exercise  Recently on prednisone        Other   Wheezing    Ongoing - some imp from last visit  Improved much with albuterol nmt today  inst to use albuterol prn  Given advair 50/100 to use one inhalation bid - rinse mouth after  Also cover with zpak in light of prod cough and length of illness Rev her cxr from last visit Close f/u planned Update if not starting to improve in a week or if worsening        Relevant Medications   albuterol (PROVENTIL) (2.5 MG/3ML) 0.083% nebulizer solution 2.5 mg (Completed)

## 2015-02-13 NOTE — Patient Instructions (Signed)
You have some wheezing that seems reversible with the breathing treatment  Use the advair 1 inhalation twice daily (on a schedule)  Use albuterol inhaler as needed  Take the zpak as directed Follow up with me in 2-3 weeks  If your symptoms worsen- alert me

## 2015-02-15 NOTE — Assessment & Plan Note (Signed)
Ongoing - some imp from last visit  Improved much with albuterol nmt today  inst to use albuterol prn  Given advair 50/100 to use one inhalation bid - rinse mouth after  Also cover with zpak in light of prod cough and length of illness Rev her cxr from last visit Close f/u planned Update if not starting to improve in a week or if worsening

## 2015-02-15 NOTE — Assessment & Plan Note (Signed)
bp in fair control at this time  BP Readings from Last 1 Encounters:  02/13/15 136/84   No changes needed Disc lifstyle change with low sodium diet and exercise  Recently on prednisone

## 2015-03-03 ENCOUNTER — Encounter: Payer: Self-pay | Admitting: Family Medicine

## 2015-03-03 ENCOUNTER — Ambulatory Visit (INDEPENDENT_AMBULATORY_CARE_PROVIDER_SITE_OTHER): Payer: PPO | Admitting: Family Medicine

## 2015-03-03 VITALS — BP 110/68 | HR 54 | Temp 97.8°F | Ht 61.0 in | Wt 142.2 lb

## 2015-03-03 DIAGNOSIS — R062 Wheezing: Secondary | ICD-10-CM

## 2015-03-03 DIAGNOSIS — R12 Heartburn: Secondary | ICD-10-CM | POA: Insufficient documentation

## 2015-03-03 DIAGNOSIS — I10 Essential (primary) hypertension: Secondary | ICD-10-CM | POA: Diagnosis not present

## 2015-03-03 NOTE — Assessment & Plan Note (Signed)
bp in fair control at this time  BP Readings from Last 1 Encounters:  03/03/15 110/68   No changes needed Disc lifstyle change with low sodium diet and exercise

## 2015-03-03 NOTE — Patient Instructions (Signed)
Try the zantac for 2 weeks to reduce any acid reflux that may cause throat clearing and wheezing  Continue advair through the allergy season  Take zyrtec for your allergies I'm glad you are doing better  If your wheezing does not continue to improve please let me know

## 2015-03-03 NOTE — Progress Notes (Signed)
Subjective:    Patient ID: Pamela Morales, female    DOB: 1940/12/24, 75 y.o.   MRN: [id number]  HPI Here for f/u of wheezing and HTN  Wt is up 3 lb with bmi of 26  bp is stable today  No cp or palpitations or headaches or edema  No side effects to medicines  BP Readings from Last 3 Encounters:  03/03/15 110/68  02/13/15 136/84  01/09/15 140/80    Better bp off steroids  Last visit-for ongoing uri (with nl cxr) covered with zpak and started on advair (after a course of prednisone)  Also albuterol for breakthrough - not finding herself needing  Still a little wheezing and feeling of need to clear throat  Improved from what it was Cough is much better  Phlegm - prod just a little bit/ light color  No fever  No facial pain   No asthma as a kid  Does note some acid reflux symptoms  Bought some zantac but has not taken it    Patient Active Problem List   Diagnosis Date Noted  . Heartburn 03/03/2015  . Wheezing 01/09/2015  . Fatigue 04/28/2014  . Nausea without vomiting 04/28/2014  . UTI (urinary tract infection) 04/28/2014  . Lesion of finger 07/02/2013  . Paronychia of finger of right hand 06/17/2013  . Open wound, hand 06/17/2013  . Thrush 06/17/2013  . Family history of colon cancer 01/01/2013  . Fecal occult blood test positive 01/01/2013  . Personal history of colonic polyps 01/01/2013  . Occult blood positive stool 12/27/2012  . Anemia 12/19/2012  . Colon cancer screening 12/19/2012  . Swelling of joint of right wrist 10/23/2012  . Right wrist pain 10/19/2012  . Left knee pain 10/19/2012  . Hearing loss 09/19/2012  . Tonsillith 09/19/2012  . 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 Sleep disorder 01/07/2011  . Colon polyps 07/16/2010  . BACK PAIN WITH RADICULOPATHY 03/15/2007  . History of herpes zoster 02/23/2007  . Essential hypertension 02/05/2007  . OSTEOPENIA 02/05/2007  . ECZEMA, ATOPIC 01/29/2007  . ROSACEA 01/29/2007  . BASAL CELL  CARCINOMA, HX OF 01/29/2007   Past Medical History  Diagnosis Date  . Depression   . Osteopenia   . Eczema   . Hypertension   . Rosacea   . Zoster 02/09  . 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 Adenomatous polyp of colon   . Cancer ([place]) 02/10    R breast  (est rec neg and brac neg)  . CA - cancer of ovary    Past Surgical History  Procedure Laterality Date  . Cholecystectomy  08/1996    10 yrs ago  . Abdominal hysterectomy  1996    BSO fibroids, incidental ovarian CA finding  . Breast surgery      rt total mastectomy for breast CA  . Lipoma removal  11/07  . Bladder tack    . Colonoscopy     Social History  Substance Use Topics  . Smoking status: Former Smoker    Quit date: 01/03/1990  . Smokeless tobacco: Never Used  . Alcohol Use: 1.2 oz/week    2 Standard drinks or equivalent per week     Comment: wine- rare   Family History  Problem Relation Age of Onset  . Cancer Mother     stomach  . Hypertension Mother   . Heart disease Mother     CAD  .  Stomach cancer Mother   . Cancer Father     lung  . Cancer Sister     colon, skin  . Heart disease Sister 58    MI  . Diabetes Sister   . Colon cancer Sister   . Heart disease Brother     HTN and MI  . Hypertension Brother   . Esophageal cancer Neg Hx    Allergies  Allergen Reactions  . Sulfa Antibiotics    Current Outpatient Prescriptions on File Prior to Visit  Medication Sig Dispense Refill  . albuterol (PROVENTIL HFA;VENTOLIN HFA) 108 (90 Base) MCG/ACT inhaler Inhale 2 puffs into the lungs every 4 (four) hours as needed for wheezing. 1 Inhaler 1  . amLODipine (NORVASC) 10 MG tablet TAKE 1 TABLET BY MOUTH EVERY DAY 90 tablet 1  . BIOTIN PO Take 1 tablet by mouth every evening.     . Calcium Carbonate-Vitamin D (CALTRATE 600+D PO) Take 1 tablet by mouth every evening.    . cetirizine (ZYRTEC) 10 MG tablet Take 10 mg by mouth daily.    Marland Kitchen FLUoxetine (PROZAC) 20 MG capsule TAKE  1-2 CAPSULES DAILY 60 capsule 11  . Fluticasone-Salmeterol (ADVAIR) 100-50 MCG/DOSE AEPB Inhale 1 puff into the lungs 2 (two) times daily. Always rinse mouth after use 1 each 3  . folic acid (FOLVITE) 1 MG tablet Take 1 mg by mouth daily.    Marland Kitchen MAGNESIUM PO Take 1 tablet by mouth daily as needed (only takes when working out, for muscle cramps).    . methotrexate (RHEUMATREX) 2.5 MG tablet Take 8 tablets by mouth once a week.     . metroNIDAZOLE (METROCREAM) 0.75 % cream Apply topically 2 (two) times daily. 60 g 3  . Multiple Vitamin (MULTIVITAMIN) tablet Take 1 tablet by mouth daily.       No current facility-administered medications on file prior to visit.    Review of Systems    Review of Systems  Constitutional: Negative for fever, appetite change, fatigue and unexpected weight change.  Eyes: Negative for pain and visual disturbance.  Respiratory: Negative for cough and shortness of breath.   Cardiovascular: Negative for cp or palpitations    Gastrointestinal: Negative for nausea, diarrhea and constipation. Pos for occ heartburn  Genitourinary: Negative for urgency and frequency.  Skin: Negative for pallor or rash   MSK pos for mild ankle swelling  Neurological: Negative for weakness, light-headedness, numbness and headaches.  Hematological: Negative for adenopathy. Does not bruise/bleed easily.  Psychiatric/Behavioral: Negative for dysphoric mood. The patient is not nervous/anxious.      Objective:   Physical Exam  Constitutional: She appears well-developed and well-nourished. No distress.  Well appearing   HENT:  Head: Normocephalic and atraumatic.  Right Ear: External ear normal.  Left Ear: External ear normal.  Mouth/Throat: Oropharynx is clear and moist. No oropharyngeal exudate.  Boggy nares with some clear pnd  Eyes: Conjunctivae and EOM are normal. Pupils are equal, round, and reactive to light.  Neck: Normal range of motion. Neck supple. No JVD present. Carotid bruit  is not present. No thyromegaly present.  Cardiovascular: Normal rate, regular rhythm, normal heart sounds and intact distal pulses.  Exam reveals no gallop.   Pulmonary/Chest: Effort normal. No respiratory distress. She has wheezes. She has no rales.  No crackles  Wheezing is only noted with very forced exp (then is mild) No prolonged exp phase Good air exch     Abdominal: Soft. Bowel sounds are normal. She exhibits no  distension, no abdominal bruit and no mass. There is no tenderness. There is no rebound and no guarding.  Musculoskeletal: She exhibits no edema.  Lymphadenopathy:    She has no cervical adenopathy.  Neurological: She is alert. She has normal reflexes. No cranial nerve deficit. She exhibits normal muscle tone. Coordination normal.  Skin: Skin is warm and dry. No rash noted. No pallor.  Psychiatric: She has a normal mood and affect.          Assessment & Plan:   Problem List Items Addressed This Visit      Cardiovascular and Mediastinum   Essential hypertension - Primary    bp in fair control at this time  BP Readings from Last 1 Encounters:  03/03/15 110/68   No changes needed Disc lifstyle change with low sodium diet and exercise          Other   Heartburn    Enc to try 2 weeks of zantac to see if this helps throat clearing and report back  Disc foods to avoid for reflux       Wheezing    Multifactorial with hx of recent uri/infection, allergies and some heartburn Much imp with advair - exam is reassuring- will continue this thru allergy season Also zyrtec Trial of 2 wk of zantac to see if this helps throat clearing as well Update if not starting to improve in a week or if worsening  - esp if cough or fever or other symptoms

## 2015-03-03 NOTE — Assessment & Plan Note (Signed)
Multifactorial with hx of recent uri/infection, allergies and some heartburn Much imp with advair - exam is reassuring- will continue this thru allergy season Also zyrtec Trial of 2 wk of zantac to see if this helps throat clearing as well Update if not starting to improve in a week or if worsening  - esp if cough or fever or other symptoms

## 2015-03-03 NOTE — Assessment & Plan Note (Signed)
Enc to try 2 weeks of zantac to see if this helps throat clearing and report back  Disc foods to avoid for reflux

## 2015-03-03 NOTE — Progress Notes (Signed)
Pre visit review using our clinic review tool, if applicable. No additional management support is needed unless otherwise documented below in the visit note. 

## 2015-03-17 ENCOUNTER — Ambulatory Visit: Payer: PPO

## 2015-03-24 ENCOUNTER — Ambulatory Visit (INDEPENDENT_AMBULATORY_CARE_PROVIDER_SITE_OTHER): Payer: PPO

## 2015-03-24 VITALS — BP 120/66 | HR 58 | Temp 98.9°F | Ht 61.0 in | Wt 143.5 lb

## 2015-03-24 DIAGNOSIS — Z0000 Encounter for general adult medical examination without abnormal findings: Secondary | ICD-10-CM

## 2015-03-24 NOTE — Progress Notes (Signed)
Subjective:   Pamela Morales is a 75 y.o. female who presents for an Initial Medicare Annual Wellness Visit.  Cardiac Risk Factors include: advanced age (>61mn, >>39women);hypertension     Objective:    Today's Vitals   03/24/15 1355  BP: 120/66  Pulse: 58  Temp: 98.9 F (37.2 C)  TempSrc: Oral  Height: '5\' 1"'$  (1.549 m)  Weight: 143 lb 8 oz (65.091 kg)  SpO2: 95%  PainSc: 0-No pain   Body mass index is 27.13 kg/(m^2).   Current Medications (verified) Outpatient Encounter Prescriptions as of 03/24/2015  Medication Sig  . amLODipine (NORVASC) 10 MG tablet TAKE 1 TABLET BY MOUTH EVERY DAY  . BIOTIN PO Take 1 tablet by mouth every evening.   . Calcium Carbonate-Vitamin D (CALTRATE 600+D PO) Take 1 tablet by mouth every evening.  . cetirizine (ZYRTEC) 10 MG tablet Take 10 mg by mouth daily.  .Marland KitchenFLUoxetine (PROZAC) 20 MG capsule TAKE 1-2 CAPSULES DAILY  . Fluticasone-Salmeterol (ADVAIR) 100-50 MCG/DOSE AEPB Inhale 1 puff into the lungs 2 (two) times daily. Always rinse mouth after use  . folic acid (FOLVITE) 8462MCG tablet Take by mouth. 4 tablets once daily  . MAGNESIUM PO Take 1 tablet by mouth daily as needed (only takes when working out, for muscle cramps).  . methotrexate (RHEUMATREX) 2.5 MG tablet Take 8 tablets by mouth once a week.   . metroNIDAZOLE (METROCREAM) 0.75 % cream Apply topically 2 (two) times daily.  . Multiple Vitamin (MULTIVITAMIN) tablet Take 1 tablet by mouth daily.    . ranitidine (ZANTAC) 150 MG tablet Take 150 mg by mouth at bedtime.  . [DISCONTINUED] albuterol (PROVENTIL HFA;VENTOLIN HFA) 108 (90 Base) MCG/ACT inhaler Inhale 2 puffs into the lungs every 4 (four) hours as needed for wheezing.  . [DISCONTINUED] folic acid (FOLVITE) 1 MG tablet Take 1 mg by mouth daily.   No facility-administered encounter medications on file as of 03/24/2015.    Allergies (verified) Sulfa antibiotics   History: Past Medical History  Diagnosis Date  . Depression     . Osteopenia   . Eczema   . Hypertension   . Rosacea   . Zoster 02/09  . 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 Adenomatous polyp of colon   . [place]    R breast  (est rec neg and brac neg)  . CA - cancer of ovary    Past Surgical History  Procedure Laterality Date  . Cholecystectomy  08/1996    10 yrs ago  . Abdominal hysterectomy  1996    BSO fibroids, incidental ovarian CA finding  . Breast surgery      rt total mastectomy for breast CA  . Lipoma removal  11/07  . Bladder tack    . Colonoscopy     Family History  Problem Relation Age of Onset  . Cancer Mother     stomach  . Hypertension Mother   . Heart disease Mother     CAD  . Stomach cancer Mother   . Cancer Father     lung  . Cancer Sister     colon, skin  . Heart disease Sister 552   MI  . Diabetes Sister   . Colon cancer Sister   . Heart disease Brother     HTN and MI  . Hypertension Brother   . Esophageal cancer Neg Hx    Social History  Occupational History  . Not on file.   Social History Main Topics  . Smoking status: Former Smoker    Quit date: 01/03/1990  . Smokeless tobacco: Never Used  . Alcohol Use: 1.2 oz/week    2 Standard drinks or equivalent per week     Comment: wine- rare  . Drug Use: No  . Sexual Activity: No    Tobacco Counseling Counseling given: No   Activities of Daily Living In your present state of health, do you have any difficulty performing the following activities: 03/24/2015  Hearing? (No Data)  Vision? N  Difficulty concentrating or making decisions? N  Walking or climbing stairs? N  Dressing or bathing? N  Doing errands, shopping? N  Preparing Food and eating ? N  Using the Toilet? N  In the past six months, have you accidently leaked urine? N  Do you have problems with loss of bowel control? N  Managing your Medications? N  Managing your Finances? N  Housekeeping or managing your Housekeeping? N     Immunizations and Health Maintenance Immunization History  Administered Date(s) Administered  . Influenza Split 10/11/2011  . Influenza Whole 10/04/2006  . Influenza,inj,Quad PF,36+ Mos 09/19/2012, 10/31/2013, 10/10/2014  . Pneumococcal Conjugate-13 04/28/2014  . Pneumococcal Polysaccharide-23 08/03/2012  . Td 01/03/1994, 07/17/2008   Patient Care Team: Abner Greenspan, MD as PCP - General Jari Pigg, MD as Consulting Physician (Dermatology) Webb Laws, OD as Consulting Physician (Optometry) Fanny Skates, MD as Consulting Physician (General Surgery)    Assessment:   This is a routine wellness examination for [facility].   Hearing Screening Comments: Bilateral hearing aids Vision Screening Comments: Last eye exam in Jan 2017  Hearing/Vision screen Hearing Screening Comments: Bilateral hearing aids Vision Screening Comments: Last eye exam in Jan 2017  Dietary issues and exercise activities discussed: Current Exercise Habits: Structured exercise class, Type of exercise: strength training/weights, Time (Minutes): 60, Frequency (Times/Week): 7, Weekly Exercise (Minutes/Week): 420, Intensity: Moderate, Exercise limited by: None identified  Goals    . Weight < 200 lb (90.719 kg)     Starting 04/07/2015, I will join Weight Watchers meetings in an effort to lose 10 lbs.       Depression Screen PHQ 2/9 Scores 03/24/2015 08/05/2012  PHQ - 2 Score 0 0    Fall Risk Fall Risk  03/24/2015 04/28/2014 08/05/2012  Falls in the past year? No No No    Cognitive Function: MMSE - Mini Mental State Exam 03/24/2015  Orientation to time 5/5  Orientation to Place 5/5  Registration 3/3  Attention/ Calculation 5/5  Recall 1/3  Language- name 2 objects 0 - didn't ask  Language- repeat 1/1  Language- follow 3 step command 3/3  Language- read & follow direction 1/1  Write a sentence 0 - didn't ask  Copy design 0 - didn't ask  Total score 24/30    Screening Tests Health Maintenance  Topic  Date Due  . ZOSTAVAX  03/04/2020 (Originally 04/28/1999)  . MAMMOGRAM  05/09/2015  . INFLUENZA VACCINE  08/04/2015  . COLONOSCOPY  01/18/2018  . TETANUS/TDAP  07/18/2018  . DEXA SCAN  Completed  . PNA vac Low Risk Adult  Completed      Plan:     I have personally reviewed the Medicare Annual Wellness questionnaire and have noted the following in the patient's chart:  A. Medical and social history B. Use of alcohol, tobacco or illicit drugs  C. Current medications and supplements D. Functional ability and status E.  Nutritional status F.  Physical activity G. Advance directives H. List of other physicians I.  Hospitalizations, surgeries, and ER visits in previous 12 months J.  [place] to include hearing, vision, cognitive, depression L. Referrals and appointments - none  In addition, I reviewed and addressed protocols, quality metrics, and best practice recommendations specific to patient. A written personalized care plan for preventive services as well as general preventive health recommendations were provided to patient.  See attached scanned questionnaire for additional information.   Signed,   Lindell Noe, MHA, BS, LPN Health Advisor 7/53/0051

## 2015-03-24 NOTE — Patient Instructions (Signed)
Ms. Kley , Thank you for taking time to come for your Medicare Wellness Visit. I appreciate your ongoing commitment to your health goals. Please review the following plan we discussed and let me know if I can assist you in the future.   These are the goals we discussed: Goals    . Weight < 200 lb (90.719 kg)     Starting 04/07/2015, I will join Weight Watchers meetings in an effort to lose 10 lbs.        This is a list of the screening recommended for you and due dates:  Health Maintenance  Topic Date Due  . Shingles Vaccine  03/04/2020*  . Mammogram  05/09/2015  . Flu Shot  08/04/2015  . Colon Cancer Screening  01/18/2018  . Tetanus Vaccine  07/18/2018  . DEXA scan (bone density measurement)  Completed  . Pneumonia vaccines  Completed  *Topic was postponed. The date shown is not the original due date.   Preventive Care for Adults  A healthy lifestyle and preventive care can promote health and wellness. Preventive health guidelines for adults include the following key practices.  . A routine yearly physical is a good way to check with your health care provider about your health and preventive screening. It is a chance to share any concerns and updates on your health and to receive a thorough exam.  . Visit your dentist for a routine exam and preventive care every 6 months. Brush your teeth twice a day and floss once a day. Good oral hygiene prevents tooth decay and gum disease.  . The frequency of eye exams is based on your age, health, family medical history, use  of contact lenses, and other factors. Follow your health care provider's ecommendations for frequency of eye exams.  . Eat a healthy diet. Foods like vegetables, fruits, whole grains, low-fat dairy products, and lean protein foods contain the nutrients you need without too many calories. Decrease your intake of foods high in solid fats, added sugars, and salt. Eat the right amount of calories for you. Get information  about a proper diet from your health care provider, if necessary.  . Regular physical exercise is one of the most important things you can do for your health. Most adults should get at least 150 minutes of moderate-intensity exercise (any activity that increases your heart rate and causes you to sweat) each week. In addition, most adults need muscle-strengthening exercises on 2 or more days a week.  Silver Sneakers may be a benefit available to you. To determine eligibility, you may visit the website: www.silversneakers.com or contact program at 959-587-3596 Mon-Fri between 8AM-8PM.   . Maintain a healthy weight. The body mass index (BMI) is a screening tool to identify possible weight problems. It provides an estimate of body fat based on height and weight. Your health care provider can find your BMI and can help you achieve or maintain a healthy weight.   For adults 20 years and older: ? A BMI below 18.5 is considered underweight. ? A BMI of 18.5 to 24.9 is normal. ? A BMI of 25 to 29.9 is considered overweight. ? A BMI of 30 and above is considered obese.   . Maintain normal blood lipids and cholesterol levels by exercising and minimizing your intake of saturated fat. Eat a balanced diet with plenty of fruit and vegetables. Blood tests for lipids and cholesterol should begin at age 20 and be repeated every 5 years. If your lipid or cholesterol  levels are high, you are over 50, or you are at high risk for heart disease, you may need your cholesterol levels checked more frequently. Ongoing high lipid and cholesterol levels should be treated with medicines if diet and exercise are not working.  . If you smoke, find out from your health care provider how to quit. If you do not use tobacco, please do not start.  . If you choose to drink alcohol, please do not consume more than 2 drinks per day. One drink is considered to be 12 ounces (355 mL) of beer, 5 ounces (148 mL) of wine, or 1.5 ounces (44  mL) of liquor.  . If you are 35-58 years old, ask your health care provider if you should take aspirin to prevent strokes.  . Use sunscreen. Apply sunscreen liberally and repeatedly throughout the day. You should seek shade when your shadow is shorter than you. Protect yourself by wearing long sleeves, pants, a wide-brimmed hat, and sunglasses year round, whenever you are outdoors.  . Once a month, do a whole body skin exam, using a mirror to look at the skin on your back. Tell your health care provider of new moles, moles that have irregular borders, moles that are larger than a pencil eraser, or moles that have changed in shape or color.

## 2015-03-24 NOTE — Progress Notes (Signed)
Pre visit review using our clinic review tool, if applicable. No additional management support is needed unless otherwise documented below in the visit note. 

## 2015-03-25 ENCOUNTER — Other Ambulatory Visit (INDEPENDENT_AMBULATORY_CARE_PROVIDER_SITE_OTHER): Payer: PPO

## 2015-03-25 ENCOUNTER — Telehealth (INDEPENDENT_AMBULATORY_CARE_PROVIDER_SITE_OTHER): Payer: PPO | Admitting: Family Medicine

## 2015-03-25 DIAGNOSIS — Z0000 Encounter for general adult medical examination without abnormal findings: Secondary | ICD-10-CM | POA: Diagnosis not present

## 2015-03-25 LAB — COMPREHENSIVE METABOLIC PANEL
ALT: 29 U/L (ref 0–35)
AST: 39 U/L — ABNORMAL HIGH (ref 0–37)
Albumin: 4.1 g/dL (ref 3.5–5.2)
Alkaline Phosphatase: 63 U/L (ref 39–117)
BILIRUBIN TOTAL: 0.4 mg/dL (ref 0.2–1.2)
BUN: 26 mg/dL — ABNORMAL HIGH (ref 6–23)
CALCIUM: 9.5 mg/dL (ref 8.4–10.5)
CO2: 28 meq/L (ref 19–32)
Chloride: 105 mEq/L (ref 96–112)
Creatinine, Ser: 1.25 mg/dL — ABNORMAL HIGH (ref 0.40–1.20)
GFR: 44.3 mL/min — AB (ref >60.00)
Glucose, Bld: 99 mg/dL (ref 70–99)
Potassium: 4 mEq/L (ref 3.5–5.1)
Sodium: 140 mEq/L (ref 135–145)
Total Protein: 7.1 g/dL (ref 6.0–8.3)

## 2015-03-25 LAB — CBC WITH DIFFERENTIAL/PLATELET
BASOS ABS: 0 10*3/uL (ref 0.0–0.1)
Basophils Relative: 0.2 % (ref 0.0–3.0)
EOS ABS: 0.1 10*3/uL (ref 0.0–0.7)
Eosinophils Relative: 0.7 % (ref 0.0–5.0)
HEMATOCRIT: 34.1 % — AB (ref 36.0–46.0)
Hemoglobin: 11.3 g/dL — ABNORMAL LOW (ref 12.0–15.0)
LYMPHS ABS: 1.7 10*3/uL (ref 0.7–4.0)
Lymphocytes Relative: 20.7 % (ref 12.0–46.0)
MCHC: 33.2 g/dL (ref 30.0–36.0)
MCV: 85.4 fl (ref 78.0–100.0)
MONO ABS: 0.6 10*3/uL (ref 0.1–1.0)
Monocytes Relative: 7.7 % (ref 3.0–12.0)
NEUTROS ABS: 5.8 10*3/uL (ref 1.4–7.7)
Neutrophils Relative %: 70.7 % (ref 43.0–77.0)
PLATELETS: 312 10*3/uL (ref 150.0–400.0)
RBC: 3.99 Mil/uL (ref 3.87–5.11)
RDW: 16 % — AB (ref 11.5–15.5)
WBC: 8.2 10*3/uL (ref 4.0–10.5)

## 2015-03-25 LAB — TSH: TSH: 3.08 u[IU]/mL (ref 0.35–4.50)

## 2015-03-25 NOTE — Telephone Encounter (Signed)
-----   Message from Eustace Pen, LPN sent at 4/93/5521  3:01 PM EDT ----- Regarding: Please place lab orders Patient has lab appt on 03/25/15 and then a CPE with you on 03/31/15.  Thank you :)  Katha Cabal

## 2015-03-25 NOTE — Progress Notes (Signed)
   Subjective:    Patient ID: Pamela Morales, female    DOB: February 10, 1940, 75 y.o.   MRN: [id number]  HPI    Review of Systems     Objective:   Physical Exam        Assessment & Plan:  I reviewed health advisor's note, was available for consultation, and agree with documentation and plan.

## 2015-03-31 ENCOUNTER — Ambulatory Visit (INDEPENDENT_AMBULATORY_CARE_PROVIDER_SITE_OTHER): Payer: PPO | Admitting: Family Medicine

## 2015-03-31 ENCOUNTER — Encounter: Payer: Self-pay | Admitting: Family Medicine

## 2015-03-31 VITALS — BP 118/76 | HR 64 | Temp 98.1°F | Ht 61.0 in | Wt 142.4 lb

## 2015-03-31 DIAGNOSIS — M8580 Other specified disorders of bone density and structure, unspecified site: Secondary | ICD-10-CM | POA: Diagnosis not present

## 2015-03-31 DIAGNOSIS — Z0000 Encounter for general adult medical examination without abnormal findings: Secondary | ICD-10-CM | POA: Diagnosis not present

## 2015-03-31 DIAGNOSIS — I10 Essential (primary) hypertension: Secondary | ICD-10-CM | POA: Diagnosis not present

## 2015-03-31 DIAGNOSIS — L4050 Arthropathic psoriasis, unspecified: Secondary | ICD-10-CM

## 2015-03-31 DIAGNOSIS — Z800 Family history of malignant neoplasm of digestive organs: Secondary | ICD-10-CM

## 2015-03-31 DIAGNOSIS — D649 Anemia, unspecified: Secondary | ICD-10-CM

## 2015-03-31 DIAGNOSIS — Z86000 Personal history of in-situ neoplasm of breast: Secondary | ICD-10-CM

## 2015-03-31 MED ORDER — AMLODIPINE BESYLATE 10 MG PO TABS: 10.0000 mg | ORAL_TABLET | Freq: Every day | ORAL | Status: DC

## 2015-03-31 NOTE — Assessment & Plan Note (Signed)
bp in fair control at this time  BP Readings from Last 1 Encounters:  03/31/15 118/76   No changes needed Disc lifstyle change with low sodium diet and exercise  Labs reviewed

## 2015-03-31 NOTE — Assessment & Plan Note (Signed)
Fairly stable with Hb of 11.3  On methotrexate and rheumatologist is watching routine cbc as well

## 2015-03-31 NOTE — Assessment & Plan Note (Signed)
Due for dexa 11/17-will call then to schedule  No falls or fractures Urged to continue ca and D  Disc need for calcium/ vitamin D/ wt bearing exercise and bone density test every 2 y to monitor Disc safety/ fracture risk in detail

## 2015-03-31 NOTE — Assessment & Plan Note (Signed)
Due for 5 year recall colonosc in 2020 if she wants to continue screening at that age

## 2015-03-31 NOTE — Assessment & Plan Note (Signed)
Doing well  Has mammogram planned for may (annual screening)  Nl exam  Reconstruction on R looks good Continues yearly exams with Dr Dalbert Batman as well as self exams

## 2015-03-31 NOTE — Assessment & Plan Note (Signed)
On high risk medication (methotrexate)  Labs watched by rheumatology Rev cmet and cbc  She will ask her rheumatologist if she can get a shingles vaccine in the future

## 2015-03-31 NOTE — Progress Notes (Signed)
Pre visit review using our clinic review tool, if applicable. No additional management support is needed unless otherwise documented below in the visit note. 

## 2015-03-31 NOTE — Progress Notes (Signed)
Subjective:    Patient ID: Pamela Morales, female    DOB: Sep 20, 1940, 75 y.o.   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 Self exam-no lumps  Also sees Dr Dalbert Batman every year for another breast exam  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-no symptoms/ no longer goes to gyn  Colonoscopy 11/15 nl - 5 year recall due to family hx  She has 2-3 small bm daily generally-no problems   dexa 11/15 stable osteopenia No falls or fractures Works on balance in her exercise classes Takes her ca and D every day   Hx of anemia Lab Results  Component Value Date   WBC 8.2 03/25/2015   HGB 11.3* 03/25/2015   HCT 34.1* 03/25/2015   MCV 85.4 03/25/2015   PLT 312.0 03/25/2015   this is checked regularly with her rheumatologist  Thinks the methotrexate is really helping her arthritis   bp is stable today  No cp or palpitations or headaches or edema  No side effects to medicines  On amlodipine-makes her swellin ankles a bit  BP Readings from Last 3 Encounters:  03/31/15 118/76  03/24/15 120/66  03/03/15 110/68      Sees rheumatology on methotrexate  Lab Results  Component Value Date   CREATININE 1.25* 03/25/2015   BUN 26* 03/25/2015   NA 140 03/25/2015   K 4.0 03/25/2015   CL 105 03/25/2015   CO2 28 03/25/2015   this is stable  Water intake - meals and with exercise  Can increase it - 5 servings per day-can increase it   Cholesterol Lab Results  Component Value Date   CHOL 227* 08/03/2012   CHOL 226* 07/16/2010   CHOL 188 07/17/2008   Lab Results  Component Value Date   HDL 46.70 08/03/2012   HDL 58.20 07/16/2010   HDL 43.70 07/17/2008   Lab Results  Component Value Date   LDLCALC 112* 07/17/2008   LDLCALC 97 02/05/2007   Lab Results  Component Value Date   TRIG 217.0* 08/03/2012   TRIG 153.0* 07/16/2010   TRIG 164.0* 07/17/2008   Lab Results  Component Value Date   CHOLHDL 5 08/03/2012   CHOLHDL 4 07/16/2010   CHOLHDL 4 07/17/2008   Lab Results  Component Value Date   LDLDIRECT 135.6 08/03/2012   LDLDIRECT 151.3 07/16/2010   LDL up and HDL down a little  No big changes in her diet Still exercises  Lately -more vegetables and less sweets   Thyroid is ok Lab Results  Component Value Date   TSH 3.08 03/25/2015     Patient Active Problem List   Diagnosis Date Noted  . Psoriatic arthritis ([place]) 03/31/2015  . Routine general medical examination at a health care facility 03/25/2015  . Heartburn 03/03/2015  . Wheezing 01/09/2015  . Fatigue 04/28/2014  . Family history of colon cancer 01/01/2013  .  Personal history of colonic polyps 01/01/2013  . Anemia 12/19/2012  . Colon cancer screening 12/19/2012  . Swelling of joint of right wrist 10/23/2012  . Right wrist pain 10/19/2012  . Hearing loss 09/19/2012  . 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 Sleep disorder 01/07/2011  . Colon polyps 07/16/2010  . BACK PAIN WITH RADICULOPATHY 03/15/2007  . History of herpes zoster 02/23/2007  . Essential hypertension 02/05/2007  . Osteopenia 02/05/2007  . ECZEMA, ATOPIC 01/29/2007  . ROSACEA 01/29/2007  . BASAL CELL CARCINOMA, HX OF 01/29/2007   Past Medical History  Diagnosis Date  . Depression   . Osteopenia   . Eczema   . Hypertension   . Rosacea   . Zoster 02/09  . 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 Adenomatous polyp of colon   . Cancer ([place]) 02/10    R breast  (est rec neg and brac neg)  . CA -  cancer of ovary    Past Surgical History  Procedure Laterality Date  . Cholecystectomy  08/1996    10 yrs ago  . Abdominal hysterectomy  1996    BSO fibroids, incidental ovarian CA finding  . Breast surgery      rt total mastectomy for breast CA  . Lipoma removal  11/07  . Bladder tack    . Colonoscopy     Social History  Substance Use Topics  . Smoking status: Former Smoker    Quit date: 01/03/1990  . Smokeless tobacco: Never Used  . Alcohol Use: 1.2 oz/week    2 Standard drinks or equivalent per week     Comment: wine- rare   Family History  Problem Relation Age of Onset  . Cancer Mother     stomach  . Hypertension Mother   . Heart disease Mother     CAD  . Stomach cancer Mother   . Cancer Father     lung  . Cancer Sister     colon, skin  . Heart disease Sister 49    MI  . Diabetes Sister   . Colon cancer Sister   . Heart disease Brother     HTN and MI  . Hypertension Brother   . Esophageal cancer Neg Hx    Allergies  Allergen Reactions  . Sulfa Antibiotics    Current Outpatient Prescriptions on File Prior to Visit  Medication Sig Dispense Refill  . BIOTIN PO Take 1 tablet by mouth every evening.     . Calcium Carbonate-Vitamin D (CALTRATE 600+D PO) Take 1 tablet by mouth every evening.    . cetirizine (ZYRTEC) 10 MG tablet Take 10 mg by mouth daily.    Marland Kitchen FLUoxetine (PROZAC) 20 MG capsule TAKE 1-2 CAPSULES DAILY 60 capsule 11  . Fluticasone-Salmeterol (ADVAIR) 100-50 MCG/DOSE AEPB Inhale 1 puff into the lungs 2 (two) times daily. Always rinse mouth after use 1 each 3  . folic acid (FOLVITE) 025 MCG tablet Take by mouth. 4 tablets once daily    . MAGNESIUM PO Take 1 tablet by mouth daily as needed (only takes when working out, for muscle cramps).    . methotrexate (RHEUMATREX) 2.5 MG tablet Take 8 tablets by mouth once a week.     . metroNIDAZOLE (METROCREAM) 0.75 % cream Apply topically 2 (two) times daily. 60 g 3  . Multiple Vitamin (MULTIVITAMIN) tablet  Take 1 tablet by mouth daily.      . ranitidine (  ZANTAC) 150 MG tablet Take 150 mg by mouth at bedtime.     No current facility-administered medications on file prior to visit.    Review of Systems Review of Systems  Constitutional: Negative for fever, appetite change, fatigue and unexpected weight change.  Eyes: Negative for pain and visual disturbance.  Respiratory: Negative for cough and shortness of breath.   Cardiovascular: Negative for cp or palpitations    Gastrointestinal: Negative for nausea, diarrhea and constipation.  Genitourinary: Negative for urgency and frequency.  Skin: Negative for pallor or rash MSK pos for joint pain that is improved    Neurological: Negative for weakness, light-headedness, numbness and headaches.  Hematological: Negative for adenopathy. Does not bruise/bleed easily.  Psychiatric/Behavioral: Negative for dysphoric mood. The patient is not nervous/anxious.         Objective:   Physical Exam  Constitutional: She appears well-developed and well-nourished. No distress.  Well appearing  overwt   HENT:  Head: Normocephalic and atraumatic.  Right Ear: External ear normal.  Left Ear: External ear normal.  Mouth/Throat: Oropharynx is clear and moist.  Eyes: Conjunctivae and EOM are normal. Pupils are equal, round, and reactive to light. No scleral icterus.  Neck: Normal range of motion. Neck supple. No JVD present. Carotid bruit is not present. No thyromegaly present.  Cardiovascular: Normal rate, regular rhythm, normal heart sounds and intact distal pulses.  Exam reveals no gallop.   Pulmonary/Chest: Effort normal and breath sounds normal. No respiratory distress. She has no wheezes. She exhibits no tenderness.  Abdominal: Soft. Bowel sounds are normal. She exhibits no distension, no abdominal bruit and no mass. There is no tenderness.  Mid abdominal hernia -unchanged/reducible and nontender    Genitourinary: No breast swelling, tenderness, discharge  or bleeding.  R breast reconstruction intact/no tenderness  L breast   : No mass, nodules, thickening, tenderness, bulging, retraction, inflamation, nipple discharge or skin changes noted.  No axillary or clavicular LA.      Musculoskeletal: Normal range of motion. She exhibits no edema or tenderness.  Lymphadenopathy:    She has no cervical adenopathy.  Neurological: She is alert. She has normal reflexes. No cranial nerve deficit. She exhibits normal muscle tone. Coordination normal.  Skin: Skin is warm and dry. No rash noted. No erythema. No pallor.  Psychiatric: She has a normal mood and affect.          Assessment & Plan:   Problem List Items Addressed This Visit      Cardiovascular and Mediastinum   Essential hypertension - Primary    bp in fair control at this time  BP Readings from Last 1 Encounters:  03/31/15 118/76   No changes needed Disc lifstyle change with low sodium diet and exercise  Labs reviewed        Relevant Medications   amLODipine (NORVASC) 10 MG tablet     Musculoskeletal and Integument   Osteopenia    Due for dexa 11/17-will call then to schedule  No falls or fractures Urged to continue ca and D  Disc need for calcium/ vitamin D/ wt bearing exercise and bone density test every 2 y to monitor Disc safety/ fracture risk in detail         Psoriatic arthritis ([place])    On high risk medication (methotrexate)  Labs watched by rheumatology Rev cmet and cbc  She will ask her rheumatologist if she can get a shingles vaccine in the future        Other  Anemia    Fairly stable with Hb of 11.3  On methotrexate and rheumatologist is watching routine cbc as well        Family history of colon cancer    Due for 5 year recall colonosc in 2020 if she wants to continue screening at that age      History of carcinoma in situ of breast    Doing well  Has mammogram planned for may (annual screening)  Nl exam  Reconstruction on R looks  good Continues yearly exams with Dr Dalbert Batman as well as self exams       Routine general medical examination at a health care facility    Reviewed health habits including diet and exercise and skin cancer prevention Reviewed appropriate screening tests for age  Also reviewed health mt list, fam hx and immunization status , as well as social and family history   See HPI Labs reviewed  AMW visit reviewed Ask your rheumatologist if you can get the shingles vaccine (since it is a live vaccine and you are on methotrexate) Don't forget to get your mammogram in May  You will be due for a bone density in November - call us after Nov 1 to get it scheduled if you want to  Continue your calcium and vitamin D Make sure to drink 8 servings of water per day  Labs are stable  Continue exercise  For cholesterol (Avoid red meat/ fried foods/ egg yolks/ fatty breakfast meats/ butter, cheese and high fat dairy/ and shellfish)

## 2015-03-31 NOTE — Assessment & Plan Note (Signed)
Reviewed health habits including diet and exercise and skin cancer prevention Reviewed appropriate screening tests for age  Also reviewed health mt list, fam hx and immunization status , as well as social and family history   See HPI Labs reviewed  AMW visit reviewed Ask your rheumatologist if you can get the shingles vaccine (since it is a live vaccine and you are on methotrexate) Don't forget to get your mammogram in May  You will be due for a bone density in November - call us after Nov 1 to get it scheduled if you want to  Continue your calcium and vitamin D Make sure to drink 8 servings of water per day  Labs are stable  Continue exercise  For cholesterol (Avoid red meat/ fried foods/ egg yolks/ fatty breakfast meats/ butter, cheese and high fat dairy/ and shellfish)

## 2015-03-31 NOTE — Patient Instructions (Addendum)
Ask your rheumatologist if you can get the shingles vaccine (since it is a live vaccine and you are on methotrexate) Don't forget to get your mammogram in May  You will be due for a bone density in November - call us after Nov 1 to get it scheduled if you want to  Continue your calcium and vitamin D Make sure to drink 8 servings of water per day  Labs are stable  Continue exercise  For cholesterol (Avoid red meat/ fried foods/ egg yolks/ fatty breakfast meats/ butter, cheese and high fat dairy/ and shellfish)

## 2015-04-01 ENCOUNTER — Other Ambulatory Visit: Payer: Self-pay | Admitting: Family Medicine

## 2015-04-07 DIAGNOSIS — Z79899 Other long term (current) drug therapy: Secondary | ICD-10-CM | POA: Diagnosis not present

## 2015-04-07 DIAGNOSIS — L4050 Arthropathic psoriasis, unspecified: Secondary | ICD-10-CM | POA: Diagnosis not present

## 2015-04-07 DIAGNOSIS — L409 Psoriasis, unspecified: Secondary | ICD-10-CM | POA: Diagnosis not present

## 2015-06-01 ENCOUNTER — Other Ambulatory Visit: Payer: Self-pay | Admitting: Family Medicine

## 2015-06-09 DIAGNOSIS — Z85828 Personal history of other malignant neoplasm of skin: Secondary | ICD-10-CM | POA: Diagnosis not present

## 2015-06-09 DIAGNOSIS — Z853 Personal history of malignant neoplasm of breast: Secondary | ICD-10-CM | POA: Diagnosis not present

## 2015-06-09 DIAGNOSIS — C44319 Basal cell carcinoma of skin of other parts of face: Secondary | ICD-10-CM | POA: Diagnosis not present

## 2015-06-09 DIAGNOSIS — D0472 Carcinoma in situ of skin of left lower limb, including hip: Secondary | ICD-10-CM | POA: Diagnosis not present

## 2015-06-09 DIAGNOSIS — D485 Neoplasm of uncertain behavior of skin: Secondary | ICD-10-CM | POA: Diagnosis not present

## 2015-06-09 DIAGNOSIS — L570 Actinic keratosis: Secondary | ICD-10-CM | POA: Diagnosis not present

## 2015-06-09 DIAGNOSIS — Z1231 Encounter for screening mammogram for malignant neoplasm of breast: Secondary | ICD-10-CM | POA: Diagnosis not present

## 2015-06-09 DIAGNOSIS — D225 Melanocytic nevi of trunk: Secondary | ICD-10-CM | POA: Diagnosis not present

## 2015-06-09 DIAGNOSIS — L237 Allergic contact dermatitis due to plants, except food: Secondary | ICD-10-CM | POA: Diagnosis not present

## 2015-06-09 LAB — HM MAMMOGRAPHY

## 2015-06-10 ENCOUNTER — Encounter: Payer: Self-pay | Admitting: Family Medicine

## 2015-06-11 ENCOUNTER — Encounter: Payer: Self-pay | Admitting: *Deleted

## 2015-06-23 DIAGNOSIS — D0472 Carcinoma in situ of skin of left lower limb, including hip: Secondary | ICD-10-CM | POA: Diagnosis not present

## 2015-06-26 ENCOUNTER — Encounter: Payer: Self-pay | Admitting: Genetic Counselor

## 2015-07-28 DIAGNOSIS — M25561 Pain in right knee: Secondary | ICD-10-CM | POA: Diagnosis not present

## 2015-07-28 DIAGNOSIS — M150 Primary generalized (osteo)arthritis: Secondary | ICD-10-CM | POA: Diagnosis not present

## 2015-07-28 DIAGNOSIS — C4491 Basal cell carcinoma of skin, unspecified: Secondary | ICD-10-CM | POA: Diagnosis not present

## 2015-07-28 DIAGNOSIS — L409 Psoriasis, unspecified: Secondary | ICD-10-CM | POA: Diagnosis not present

## 2015-07-28 DIAGNOSIS — L4050 Arthropathic psoriasis, unspecified: Secondary | ICD-10-CM | POA: Diagnosis not present

## 2015-07-28 DIAGNOSIS — Z79899 Other long term (current) drug therapy: Secondary | ICD-10-CM | POA: Diagnosis not present

## 2015-07-28 DIAGNOSIS — M25562 Pain in left knee: Secondary | ICD-10-CM | POA: Diagnosis not present

## 2015-08-17 DIAGNOSIS — C44319 Basal cell carcinoma of skin of other parts of face: Secondary | ICD-10-CM | POA: Diagnosis not present

## 2015-08-27 ENCOUNTER — Other Ambulatory Visit: Payer: Self-pay | Admitting: *Deleted

## 2015-08-27 MED ORDER — FLUOXETINE HCL 20 MG PO CAPS: ORAL_CAPSULE | ORAL | 1 refills | Status: DC

## 2015-10-27 DIAGNOSIS — Z79899 Other long term (current) drug therapy: Secondary | ICD-10-CM | POA: Diagnosis not present

## 2015-10-27 DIAGNOSIS — M150 Primary generalized (osteo)arthritis: Secondary | ICD-10-CM | POA: Diagnosis not present

## 2015-10-27 DIAGNOSIS — C4491 Basal cell carcinoma of skin, unspecified: Secondary | ICD-10-CM | POA: Diagnosis not present

## 2015-10-27 DIAGNOSIS — L409 Psoriasis, unspecified: Secondary | ICD-10-CM | POA: Diagnosis not present

## 2015-10-27 DIAGNOSIS — L4050 Arthropathic psoriasis, unspecified: Secondary | ICD-10-CM | POA: Diagnosis not present

## 2015-12-22 DIAGNOSIS — D225 Melanocytic nevi of trunk: Secondary | ICD-10-CM | POA: Diagnosis not present

## 2015-12-22 DIAGNOSIS — Z86018 Personal history of other benign neoplasm: Secondary | ICD-10-CM | POA: Diagnosis not present

## 2015-12-22 DIAGNOSIS — D2272 Melanocytic nevi of left lower limb, including hip: Secondary | ICD-10-CM | POA: Diagnosis not present

## 2015-12-22 DIAGNOSIS — L570 Actinic keratosis: Secondary | ICD-10-CM | POA: Diagnosis not present

## 2015-12-22 DIAGNOSIS — D1800 Hemangioma unspecified site: Secondary | ICD-10-CM | POA: Diagnosis not present

## 2015-12-22 DIAGNOSIS — L821 Other seborrheic keratosis: Secondary | ICD-10-CM | POA: Diagnosis not present

## 2015-12-22 DIAGNOSIS — C4441 Basal cell carcinoma of skin of scalp and neck: Secondary | ICD-10-CM | POA: Diagnosis not present

## 2015-12-22 DIAGNOSIS — D485 Neoplasm of uncertain behavior of skin: Secondary | ICD-10-CM | POA: Diagnosis not present

## 2015-12-22 DIAGNOSIS — D2261 Melanocytic nevi of right upper limb, including shoulder: Secondary | ICD-10-CM | POA: Diagnosis not present

## 2015-12-22 DIAGNOSIS — Z808 Family history of malignant neoplasm of other organs or systems: Secondary | ICD-10-CM | POA: Diagnosis not present

## 2016-01-26 DIAGNOSIS — L4050 Arthropathic psoriasis, unspecified: Secondary | ICD-10-CM | POA: Diagnosis not present

## 2016-01-26 DIAGNOSIS — Z6825 Body mass index (BMI) 25.0-25.9, adult: Secondary | ICD-10-CM | POA: Diagnosis not present

## 2016-01-26 DIAGNOSIS — Z79899 Other long term (current) drug therapy: Secondary | ICD-10-CM | POA: Diagnosis not present

## 2016-01-26 DIAGNOSIS — E663 Overweight: Secondary | ICD-10-CM | POA: Diagnosis not present

## 2016-01-26 DIAGNOSIS — C4491 Basal cell carcinoma of skin, unspecified: Secondary | ICD-10-CM | POA: Diagnosis not present

## 2016-01-26 DIAGNOSIS — L409 Psoriasis, unspecified: Secondary | ICD-10-CM | POA: Diagnosis not present

## 2016-01-26 DIAGNOSIS — M150 Primary generalized (osteo)arthritis: Secondary | ICD-10-CM | POA: Diagnosis not present

## 2016-01-26 DIAGNOSIS — R5383 Other fatigue: Secondary | ICD-10-CM | POA: Diagnosis not present

## 2016-02-02 DIAGNOSIS — C4441 Basal cell carcinoma of skin of scalp and neck: Secondary | ICD-10-CM | POA: Diagnosis not present

## 2016-03-08 DIAGNOSIS — Z79899 Other long term (current) drug therapy: Secondary | ICD-10-CM | POA: Diagnosis not present

## 2016-03-20 ENCOUNTER — Other Ambulatory Visit: Payer: Self-pay | Admitting: Family Medicine

## 2016-03-23 ENCOUNTER — Telehealth: Payer: Self-pay | Admitting: *Deleted

## 2016-03-23 ENCOUNTER — Ambulatory Visit (INDEPENDENT_AMBULATORY_CARE_PROVIDER_SITE_OTHER): Payer: PPO | Admitting: Family Medicine

## 2016-03-23 ENCOUNTER — Encounter: Payer: Self-pay | Admitting: Family Medicine

## 2016-03-23 ENCOUNTER — Ambulatory Visit (INDEPENDENT_AMBULATORY_CARE_PROVIDER_SITE_OTHER)
Admission: RE | Admit: 2016-03-23 | Discharge: 2016-03-23 | Disposition: A | Discharge status: Home / Self Care | Payer: PPO | Source: Ambulatory Visit | Attending: Family Medicine | Admitting: Family Medicine

## 2016-03-23 VITALS — BP 116/62 | HR 51 | Temp 97.5°F | Ht 61.0 in | Wt 135.5 lb

## 2016-03-23 DIAGNOSIS — I10 Essential (primary) hypertension: Secondary | ICD-10-CM

## 2016-03-23 DIAGNOSIS — R0602 Shortness of breath: Secondary | ICD-10-CM | POA: Diagnosis not present

## 2016-03-23 DIAGNOSIS — Z1231 Encounter for screening mammogram for malignant neoplasm of breast: Secondary | ICD-10-CM | POA: Insufficient documentation

## 2016-03-23 DIAGNOSIS — R5382 Chronic fatigue, unspecified: Secondary | ICD-10-CM | POA: Diagnosis not present

## 2016-03-23 DIAGNOSIS — L4050 Arthropathic psoriasis, unspecified: Secondary | ICD-10-CM | POA: Diagnosis not present

## 2016-03-23 DIAGNOSIS — E2839 Other primary ovarian failure: Secondary | ICD-10-CM | POA: Insufficient documentation

## 2016-03-23 DIAGNOSIS — R0609 Other forms of dyspnea: Secondary | ICD-10-CM

## 2016-03-23 LAB — CBC WITH DIFFERENTIAL/PLATELET
BASOS PCT: 0.7 % (ref 0.0–3.0)
Basophils Absolute: 0.1 10*3/uL (ref 0.0–0.1)
Eosinophils Absolute: 0.1 10*3/uL (ref 0.0–0.7)
Eosinophils Relative: 0.6 % (ref 0.0–5.0)
HCT: 38 % (ref 36.0–46.0)
HEMOGLOBIN: 12.5 g/dL (ref 12.0–15.0)
Lymphocytes Relative: 23.4 % (ref 12.0–46.0)
Lymphs Abs: 2.3 10*3/uL (ref 0.7–4.0)
MCHC: 32.9 g/dL (ref 30.0–36.0)
MCV: 87.1 fl (ref 78.0–100.0)
MONO ABS: 0.6 10*3/uL (ref 0.1–1.0)
Monocytes Relative: 6.5 % (ref 3.0–12.0)
Neutro Abs: 6.7 10*3/uL (ref 1.4–7.7)
Neutrophils Relative %: 68.8 % (ref 43.0–77.0)
Platelets: 273 10*3/uL (ref 150.0–400.0)
RBC: 4.36 Mil/uL (ref 3.87–5.11)
RDW: 15 % (ref 11.5–15.5)
WBC: 9.7 10*3/uL (ref 4.0–10.5)

## 2016-03-23 LAB — COMPREHENSIVE METABOLIC PANEL
ALT: 19 U/L (ref 0–35)
AST: 26 U/L (ref 0–37)
Albumin: 4.4 g/dL (ref 3.5–5.2)
Alkaline Phosphatase: 65 U/L (ref 39–117)
BILIRUBIN TOTAL: 0.3 mg/dL (ref 0.2–1.2)
BUN: 26 mg/dL — ABNORMAL HIGH (ref 6–23)
CO2: 29 meq/L (ref 19–32)
Calcium: 10 mg/dL (ref 8.4–10.5)
Chloride: 104 mEq/L (ref 96–112)
Creatinine, Ser: 1.3 mg/dL — ABNORMAL HIGH (ref 0.40–1.20)
GFR: 42.23 mL/min — ABNORMAL LOW (ref >60.00)
GLUCOSE: 95 mg/dL (ref 70–99)
Potassium: 4.2 mEq/L (ref 3.5–5.1)
Sodium: 139 mEq/L (ref 135–145)
TOTAL PROTEIN: 7.3 g/dL (ref 6.0–8.3)

## 2016-03-23 LAB — LDL CHOLESTEROL, DIRECT: Direct LDL: 121 mg/dL

## 2016-03-23 LAB — LIPID PANEL
CHOL/HDL RATIO: 4
CHOLESTEROL: 206 mg/dL — AB (ref 0–200)
HDL: 47.3 mg/dL (ref >39.00)
NonHDL: 158.53
Triglycerides: 202 mg/dL — ABNORMAL HIGH (ref 0.0–149.0)
VLDL: 40.4 mg/dL — ABNORMAL HIGH (ref 0.0–40.0)

## 2016-03-23 LAB — VITAMIN B12: Vitamin B-12: 664 pg/mL (ref 211–911)

## 2016-03-23 LAB — TSH: TSH: 4.48 u[IU]/mL (ref 0.35–4.50)

## 2016-03-23 NOTE — Assessment & Plan Note (Signed)
bp in fair control at this time  BP Readings from Last 1 Encounters:  03/23/16 116/62   No changes needed Disc lifstyle change with low sodium diet and exercise  Labs today

## 2016-03-23 NOTE — Assessment & Plan Note (Addendum)
HR of 49 on EKG - she is not on any rate lowering meds and no dizziness/ she does exercise regularly  Diff diag incl late copd (quit smoking in 84s), other reactive airways, mtx lung tox (unlikely) , cardiac cause  She has had 2 cancers in her lifetime  Reassuring exam cxr today  Lab today  If all nl consider pulm ref/ pft or other

## 2016-03-23 NOTE — Progress Notes (Signed)
Pre visit review using our clinic review tool, if applicable. No additional management support is needed unless otherwise documented below in the visit note. 

## 2016-03-23 NOTE — Telephone Encounter (Signed)
Pt forgot to ask you to place a referral for her mammogram and DEXA, pt was going to [facility] but the sent her a notice saying that she is due for her mammogram and DEXA but they don't take her insurance anymore and we would have to order it for another place, pt is okay with [facility] or any other place Dr. Glori Bickers suggest that's still in [place]

## 2016-03-23 NOTE — Patient Instructions (Signed)
EKG shows slow heartbeat- but otherwise normal  Chest xray now  Exam is re assuring   We will make a plan when results return

## 2016-03-23 NOTE — Telephone Encounter (Signed)
I put orders in for the breast center

## 2016-03-23 NOTE — Assessment & Plan Note (Signed)
Pt is on methotrexate with recent dose drop  She is sob on exertion but does not seem to have any other symptoms of mtx lung tox- and has been on it for years

## 2016-03-23 NOTE — Progress Notes (Signed)
Subjective:    Patient ID: Pamela Morales, female    DOB: 07/15/1940, 76 y.o.   MRN: [id number]  HPI Here for concerns about breathing   Past smoker  She smoked about 30 years - 2ppd  Quit at age 60    Notices lately she is breathing harder when she exerts herself  Still exercises regularly - notices that she is breathing harder   Cannot tell whether she is having trouble getting air in or out  Just out of breath Some wheezing as well  This does not feel similar to last year   No chest discomfort  More tired than usual   Sees rheumatology  Did not discuss MTX tox to lung at all  Recently cut back dose   No swelling in legs No hx of blood clots  Not using any inhalers right now  She used advair in the past - for a few months- in retrospect did not help    BP Readings from Last 3 Encounters:  03/23/16 116/62  03/31/15 118/76  03/24/15 120/66   No heart problems in personal hx  Some heart dz in the family  Sister had heart problems - CAD = and died in her 25s   Lab Results  Component Value Date   CHOL 227 (H) 08/03/2012   HDL 46.70 08/03/2012   LDLCALC 112 (H) 07/17/2008   LDLDIRECT 135.6 08/03/2012   TRIG 217.0 (H) 08/03/2012   CHOLHDL 5 08/03/2012    Breathes through her mouth- habit? No nasal congestion   Last cxr was 1/17 PACS Images   Show images for DG Chest 2 View  Study Result   CLINICAL DATA:  Cough, chest tightness ; discontinued smoking 23 years ago; history of breast and ovarian malignancy  EXAM: CHEST  2 VIEW  COMPARISON:  PA and lateral chest x-ray of March 14, 2008  FINDINGS: The lungs are adequately inflated and clear. The heart and pulmonary vascularity are normal. The mediastinum is normal in width. There is no pleural effusion. There is multilevel degenerative disc disease of the thoracic spine. There surgical clips overlying the right breast and right axillary region.  IMPRESSION: There is no active cardiopulmonary  disease.    Pulse ox is 97% on RA after walking down the hall    Wt Readings from Last 3 Encounters:  03/23/16 135 lb 8 oz (61.5 kg)  03/31/15 142 lb 6.4 oz (64.6 kg)  03/24/15 143 lb 8 oz (65.1 kg)   bmi 25.6  EKG today shows sinus bradycardia with HR of 49  Patient Active Problem List   Diagnosis Date Noted  . Dyspnea on exertion 03/23/2016  . Psoriatic arthritis ([place]) 03/31/2015  . Routine general medical examination at a health care facility 03/25/2015  . Heartburn 03/03/2015  . Wheezing 01/09/2015  . Fatigue 04/28/2014  . Family history of colon cancer 01/01/2013  . Personal history of colonic polyps 01/01/2013  . Anemia 12/19/2012  . Colon cancer screening 12/19/2012  . Swelling of joint of right wrist 10/23/2012  . Right wrist pain 10/19/2012  . Hearing loss 09/19/2012  . 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 Sleep disorder 01/07/2011  . Colon polyps 07/16/2010  . BACK PAIN WITH RADICULOPATHY 03/15/2007  . History of herpes zoster 02/23/2007  . Essential hypertension 02/05/2007  . Osteopenia 02/05/2007  . ECZEMA, ATOPIC 01/29/2007  . ROSACEA 01/29/2007  . BASAL CELL CARCINOMA, HX OF 01/29/2007   Past Medical History:  Diagnosis Date  .  Adenomatous polyp of colon   . CA - cancer of ovary   . Cancer ([place]) 02/10   R breast  (est rec neg and brac neg)  . DDD (degenerative disc disease)    in back with chronic pain  . Depression   . Eczema   . Hypertension   . Osteopenia   . Rosacea   . Zoster 02/09   Past Surgical History:  Procedure Laterality Date  . ABDOMINAL HYSTERECTOMY  1996   BSO fibroids, incidental ovarian CA finding  . bladder tack    . BREAST SURGERY     rt total mastectomy for breast CA  . CHOLECYSTECTOMY  08/1996   10 yrs ago  . COLONOSCOPY    . lipoma removal  11/07   Social History  Substance Use Topics  . Smoking status: Former Smoker    Quit date: 01/03/1990  . Smokeless tobacco: Never Used  . Alcohol use 1.2  oz/week    2 Standard drinks or equivalent per week     Comment: wine- rare   Family History  Problem Relation Age of Onset  . Cancer Mother     stomach  . Hypertension Mother   . Heart disease Mother     CAD  . Stomach cancer Mother   . Cancer Father     lung  . Cancer Sister     colon, skin  . Heart disease Sister 74    MI  . Diabetes Sister   . Colon cancer Sister   . Heart disease Brother     HTN and MI  . Hypertension Brother   . Esophageal cancer Neg Hx    Allergies  Allergen Reactions  . Sulfa Antibiotics    Current Outpatient Prescriptions on File Prior to Visit  Medication Sig Dispense Refill  . amLODipine (NORVASC) 10 MG tablet TAKE 1 TABLET (10 MG TOTAL) BY MOUTH DAILY. 90 tablet 0  . BIOTIN PO Take 1 tablet by mouth every evening.     . Calcium Carbonate-Vitamin D (CALTRATE 600+D PO) Take 1 tablet by mouth every evening.    . cetirizine (ZYRTEC) 10 MG tablet Take 10 mg by mouth daily.    Marland Kitchen FLUoxetine (PROZAC) 20 MG capsule TAKE 1-2 CAPSULES DAILY 696 capsule 1  . folic acid (FOLVITE) 789 MCG tablet Take by mouth. 4 tablets once daily    . MAGNESIUM PO Take 1 tablet by mouth daily as needed (only takes when working out, for muscle cramps).    . methotrexate (RHEUMATREX) 2.5 MG tablet Take 6 tablets by mouth once a week.     . metroNIDAZOLE (METROCREAM) 0.75 % cream Apply topically 2 (two) times daily. 60 g 3  . Multiple Vitamin (MULTIVITAMIN) tablet Take 1 tablet by mouth daily.       No current facility-administered medications on file prior to visit.      Review of Systems    Review of Systems  Constitutional: Negative for fever, appetite change,  and unexpected weight change.  Eyes: Negative for pain and visual disturbance.  Respiratory: Negative for cough and pos for exertional shortness of breath.   Cardiovascular: Negative for cp or palpitations   neg for PND or orthopnea or pedal edema  Gastrointestinal: Negative for nausea, diarrhea and  constipation.  Genitourinary: Negative for urgency and frequency.  Skin: Negative for pallor or rash   Neurological: Negative for weakness, light-headedness, numbness and headaches.  Hematological: Negative for adenopathy. Does not bruise/bleed easily.  Psychiatric/Behavioral: Negative for  dysphoric mood. The patient is not nervous/anxious.      Objective:   Physical Exam  Constitutional: She appears well-developed and well-nourished. No distress.  Well appearing   HENT:  Head: Normocephalic and atraumatic.  Right Ear: External ear normal.  Left Ear: External ear normal.  Nose: Nose normal.  Mouth/Throat: Oropharynx is clear and moist.  No sinus tenderness  Eyes: Conjunctivae and EOM are normal. Pupils are equal, round, and reactive to light. Right eye exhibits no discharge. Left eye exhibits no discharge. No scleral icterus.  Neck: Normal range of motion. Neck supple. No JVD present. Carotid bruit is not present. No tracheal deviation present. No thyromegaly present.  Cardiovascular: Regular rhythm, normal heart sounds and intact distal pulses.  Exam reveals no gallop and no friction rub.   No murmur heard. Rate in 50s  Pulmonary/Chest: Effort normal and breath sounds normal. No respiratory distress. She has no wheezes. She has no rales. She exhibits no tenderness.  No crackles  No sob on exam Good air exch No wheeze or prolonged exp phase   Abdominal: Soft. Bowel sounds are normal. She exhibits no distension, no abdominal bruit and no mass. There is no tenderness. There is no rebound and no guarding.  Musculoskeletal: She exhibits no edema or tenderness.  No palp cords in legs   Lymphadenopathy:    She has no cervical adenopathy.  Neurological: She is alert. She has normal reflexes. No cranial nerve deficit. She exhibits normal muscle tone. Coordination normal.  Skin: Skin is warm and dry. No rash noted. No pallor.  No cyanosis  Psychiatric: She has a normal mood and affect.    Calm/pleasant and talkative           Assessment & Plan:   Problem List Items Addressed This Visit      Cardiovascular and Mediastinum   Essential hypertension    bp in fair control at this time  BP Readings from Last 1 Encounters:  03/23/16 116/62   No changes needed Disc lifstyle change with low sodium diet and exercise  Labs today      Relevant Orders   CBC with Differential/Platelet   Comprehensive metabolic panel   TSH   Lipid panel     Musculoskeletal and Integument   Psoriatic arthritis ([place])    Pt is on methotrexate with recent dose drop  She is sob on exertion but does not seem to have any other symptoms of mtx lung tox- and has been on it for years          Other   Dyspnea on exertion    HR of 49 on EKG - she is not on any rate lowering meds and no dizziness/ she does exercise regularly  Diff diag incl late copd (quit smoking in 32s), other reactive airways, mtx lung tox (unlikely) , cardiac cause  She has had 2 cancers in her lifetime  Reassuring exam cxr today  Lab today  If all nl consider pulm ref/ pft or other       Fatigue    With sob on exertion  Generalized- acute on chronic  Lab today  cxr today  Plan to follow      Relevant Orders   CBC with Differential/Platelet   Comprehensive metabolic panel   TSH   Vitamin B12    Other Visit Diagnoses    SOB (shortness of breath) on exertion    -  Primary   Relevant Orders   EKG 12-Lead (Completed)  DG Chest 2 View (Completed)

## 2016-03-23 NOTE — Assessment & Plan Note (Signed)
With sob on exertion  Generalized- acute on chronic  Lab today  cxr today  Plan to follow

## 2016-03-25 NOTE — Telephone Encounter (Signed)
Mmg and Dexa set up and patient aware.

## 2016-04-26 DIAGNOSIS — Z79899 Other long term (current) drug therapy: Secondary | ICD-10-CM | POA: Diagnosis not present

## 2016-04-26 DIAGNOSIS — H5203 Hypermetropia, bilateral: Secondary | ICD-10-CM | POA: Diagnosis not present

## 2016-04-26 DIAGNOSIS — Z6825 Body mass index (BMI) 25.0-25.9, adult: Secondary | ICD-10-CM | POA: Diagnosis not present

## 2016-04-26 DIAGNOSIS — L4050 Arthropathic psoriasis, unspecified: Secondary | ICD-10-CM | POA: Diagnosis not present

## 2016-04-26 DIAGNOSIS — C4491 Basal cell carcinoma of skin, unspecified: Secondary | ICD-10-CM | POA: Diagnosis not present

## 2016-04-26 DIAGNOSIS — M150 Primary generalized (osteo)arthritis: Secondary | ICD-10-CM | POA: Diagnosis not present

## 2016-04-26 DIAGNOSIS — L409 Psoriasis, unspecified: Secondary | ICD-10-CM | POA: Diagnosis not present

## 2016-04-26 DIAGNOSIS — R5383 Other fatigue: Secondary | ICD-10-CM | POA: Diagnosis not present

## 2016-04-26 DIAGNOSIS — E663 Overweight: Secondary | ICD-10-CM | POA: Diagnosis not present

## 2016-06-08 ENCOUNTER — Other Ambulatory Visit: Payer: Self-pay | Admitting: Family Medicine

## 2016-06-08 NOTE — Telephone Encounter (Signed)
Will refill electronically  

## 2016-06-08 NOTE — Telephone Encounter (Signed)
Pt had an OV on 03/23/16 but it was for breathing issues, last filled on 08/27/15 #180 Caps with 1 additional refill, please advise

## 2016-06-13 ENCOUNTER — Inpatient Hospital Stay: Admission: RE | Admit: 2016-06-13 | Payer: PPO | Source: Ambulatory Visit

## 2016-06-13 ENCOUNTER — Ambulatory Visit: Payer: PPO

## 2016-06-18 ENCOUNTER — Other Ambulatory Visit: Payer: Self-pay | Admitting: Family Medicine

## 2016-06-21 DIAGNOSIS — D1800 Hemangioma unspecified site: Secondary | ICD-10-CM | POA: Diagnosis not present

## 2016-06-21 DIAGNOSIS — D485 Neoplasm of uncertain behavior of skin: Secondary | ICD-10-CM | POA: Diagnosis not present

## 2016-06-21 DIAGNOSIS — M795 Residual foreign body in soft tissue: Secondary | ICD-10-CM | POA: Diagnosis not present

## 2016-06-21 DIAGNOSIS — Z808 Family history of malignant neoplasm of other organs or systems: Secondary | ICD-10-CM | POA: Diagnosis not present

## 2016-06-21 DIAGNOSIS — D225 Melanocytic nevi of trunk: Secondary | ICD-10-CM | POA: Diagnosis not present

## 2016-06-21 DIAGNOSIS — Z85828 Personal history of other malignant neoplasm of skin: Secondary | ICD-10-CM | POA: Diagnosis not present

## 2016-06-21 DIAGNOSIS — L570 Actinic keratosis: Secondary | ICD-10-CM | POA: Diagnosis not present

## 2016-06-21 DIAGNOSIS — Z86018 Personal history of other benign neoplasm: Secondary | ICD-10-CM | POA: Diagnosis not present

## 2016-06-21 DIAGNOSIS — D2261 Melanocytic nevi of right upper limb, including shoulder: Secondary | ICD-10-CM | POA: Diagnosis not present

## 2016-06-21 DIAGNOSIS — C44219 Basal cell carcinoma of skin of left ear and external auricular canal: Secondary | ICD-10-CM | POA: Diagnosis not present

## 2016-06-21 DIAGNOSIS — C44712 Basal cell carcinoma of skin of right lower limb, including hip: Secondary | ICD-10-CM | POA: Diagnosis not present

## 2016-06-21 DIAGNOSIS — D2272 Melanocytic nevi of left lower limb, including hip: Secondary | ICD-10-CM | POA: Diagnosis not present

## 2016-06-23 ENCOUNTER — Telehealth: Payer: Self-pay | Admitting: Family Medicine

## 2016-06-23 NOTE — Telephone Encounter (Signed)
Spoke to household member. Pt not home, he will have pt call back to schedule AWV with Katha Cabal and CPE with PCP

## 2016-07-26 DIAGNOSIS — Z79899 Other long term (current) drug therapy: Secondary | ICD-10-CM | POA: Diagnosis not present

## 2016-07-26 DIAGNOSIS — L409 Psoriasis, unspecified: Secondary | ICD-10-CM | POA: Diagnosis not present

## 2016-07-26 DIAGNOSIS — Z6824 Body mass index (BMI) 24.0-24.9, adult: Secondary | ICD-10-CM | POA: Diagnosis not present

## 2016-07-26 DIAGNOSIS — M150 Primary generalized (osteo)arthritis: Secondary | ICD-10-CM | POA: Diagnosis not present

## 2016-07-26 DIAGNOSIS — L4050 Arthropathic psoriasis, unspecified: Secondary | ICD-10-CM | POA: Diagnosis not present

## 2016-07-26 DIAGNOSIS — M2550 Pain in unspecified joint: Secondary | ICD-10-CM | POA: Diagnosis not present

## 2016-07-29 NOTE — Telephone Encounter (Signed)
Scheduled

## 2016-08-23 ENCOUNTER — Ambulatory Visit: Payer: PPO

## 2016-08-30 DIAGNOSIS — M8589 Other specified disorders of bone density and structure, multiple sites: Secondary | ICD-10-CM | POA: Diagnosis not present

## 2016-08-30 DIAGNOSIS — Z1231 Encounter for screening mammogram for malignant neoplasm of breast: Secondary | ICD-10-CM | POA: Diagnosis not present

## 2016-08-30 DIAGNOSIS — Z853 Personal history of malignant neoplasm of breast: Secondary | ICD-10-CM | POA: Diagnosis not present

## 2016-08-30 LAB — HM DEXA SCAN

## 2016-08-31 ENCOUNTER — Encounter: Payer: Self-pay | Admitting: Family Medicine

## 2016-08-31 ENCOUNTER — Encounter: Payer: PPO | Admitting: Family Medicine

## 2016-09-02 ENCOUNTER — Encounter: Payer: Self-pay | Admitting: Family Medicine

## 2016-09-03 HISTORY — PX: BASAL CELL CARCINOMA EXCISION: SHX1214

## 2016-09-08 ENCOUNTER — Encounter: Payer: Self-pay | Admitting: *Deleted

## 2016-09-08 DIAGNOSIS — C44719 Basal cell carcinoma of skin of left lower limb, including hip: Secondary | ICD-10-CM | POA: Diagnosis not present

## 2016-09-08 DIAGNOSIS — C44219 Basal cell carcinoma of skin of left ear and external auricular canal: Secondary | ICD-10-CM | POA: Diagnosis not present

## 2016-09-08 DIAGNOSIS — C44712 Basal cell carcinoma of skin of right lower limb, including hip: Secondary | ICD-10-CM | POA: Diagnosis not present

## 2016-10-09 ENCOUNTER — Telehealth: Payer: Self-pay | Admitting: Family Medicine

## 2016-10-09 DIAGNOSIS — I10 Essential (primary) hypertension: Secondary | ICD-10-CM

## 2016-10-09 NOTE — Telephone Encounter (Signed)
-----   Message from Eustace Pen, LPN sent at 31/01/2160 11:50 AM EDT ----- Regarding: Labs 10/10 Lab orders needed. Thank you.  Insurance: [employer]    If none, please advise.

## 2016-10-12 ENCOUNTER — Ambulatory Visit (INDEPENDENT_AMBULATORY_CARE_PROVIDER_SITE_OTHER): Payer: PPO

## 2016-10-12 VITALS — BP 110/68 | HR 54 | Temp 97.2°F | Ht 59.75 in | Wt 137.0 lb

## 2016-10-12 DIAGNOSIS — Z0000 Encounter for general adult medical examination without abnormal findings: Secondary | ICD-10-CM

## 2016-10-12 DIAGNOSIS — I10 Essential (primary) hypertension: Secondary | ICD-10-CM

## 2016-10-12 DIAGNOSIS — Z23 Encounter for immunization: Secondary | ICD-10-CM

## 2016-10-12 LAB — LIPID PANEL
CHOL/HDL RATIO: 5
CHOLESTEROL: 215 mg/dL — AB (ref 0–200)
HDL: 40.4 mg/dL (ref >39.00)
NonHDL: 174.53
TRIGLYCERIDES: 251 mg/dL — AB (ref 0.0–149.0)
VLDL: 50.2 mg/dL — AB (ref 0.0–40.0)

## 2016-10-12 LAB — COMPREHENSIVE METABOLIC PANEL
ALBUMIN: 4.4 g/dL (ref 3.5–5.2)
ALK PHOS: 66 U/L (ref 39–117)
ALT: 19 U/L (ref 0–35)
AST: 25 U/L (ref 0–37)
BUN: 29 mg/dL — ABNORMAL HIGH (ref 6–23)
CALCIUM: 9.7 mg/dL (ref 8.4–10.5)
CHLORIDE: 102 meq/L (ref 96–112)
CO2: 29 mEq/L (ref 19–32)
Creatinine, Ser: 1.58 mg/dL — ABNORMAL HIGH (ref 0.40–1.20)
GFR: 33.67 mL/min — AB (ref >60.00)
Glucose, Bld: 105 mg/dL — ABNORMAL HIGH (ref 70–99)
POTASSIUM: 4.9 meq/L (ref 3.5–5.1)
SODIUM: 138 meq/L (ref 135–145)
Total Bilirubin: 0.2 mg/dL (ref 0.2–1.2)
Total Protein: 7.3 g/dL (ref 6.0–8.3)

## 2016-10-12 LAB — TSH: TSH: 5.67 u[IU]/mL — ABNORMAL HIGH (ref 0.35–4.50)

## 2016-10-12 LAB — CBC WITH DIFFERENTIAL/PLATELET
BASOS PCT: 0.6 % (ref 0.0–3.0)
Basophils Absolute: 0.1 10*3/uL (ref 0.0–0.1)
EOS PCT: 0.9 % (ref 0.0–5.0)
Eosinophils Absolute: 0.1 10*3/uL (ref 0.0–0.7)
HCT: 36.9 % (ref 36.0–46.0)
Hemoglobin: 12 g/dL (ref 12.0–15.0)
LYMPHS ABS: 2.6 10*3/uL (ref 0.7–4.0)
Lymphocytes Relative: 24.4 % (ref 12.0–46.0)
MCHC: 32.7 g/dL (ref 30.0–36.0)
MCV: 87.3 fl (ref 78.0–100.0)
MONO ABS: 0.6 10*3/uL (ref 0.1–1.0)
MONOS PCT: 6.2 % (ref 3.0–12.0)
NEUTROS PCT: 67.9 % (ref 43.0–77.0)
Neutro Abs: 7.1 10*3/uL (ref 1.4–7.7)
Platelets: 285 10*3/uL (ref 150.0–400.0)
RBC: 4.22 Mil/uL (ref 3.87–5.11)
RDW: 14.1 % (ref 11.5–15.5)
WBC: 10.5 10*3/uL (ref 4.0–10.5)

## 2016-10-12 LAB — LDL CHOLESTEROL, DIRECT: Direct LDL: 124 mg/dL

## 2016-10-12 NOTE — Progress Notes (Signed)
Pre visit review using our clinic review tool, if applicable. No additional management support is needed unless otherwise documented below in the visit note. 

## 2016-10-12 NOTE — Patient Instructions (Signed)
Pamela Morales , Thank you for taking time to come for your Medicare Wellness Visit. I appreciate your ongoing commitment to your health goals. Please review the following plan we discussed and let me know if I can assist you in the future.   These are the goals we discussed: Goals    . Increase physical activity          Starting 10/12/2016, I will continue to exercise for 60-90 min 6-7 days per week.        This is a list of the screening recommended for you and due dates:  Health Maintenance  Topic Date Due  . Mammogram  08/30/2017  . Colon Cancer Screening  01/18/2018  . Tetanus Vaccine  07/18/2018  . Flu Shot  Completed  . DEXA scan (bone density measurement)  Completed  . Pneumonia vaccines  Completed   Preventive Care for Adults  A healthy lifestyle and preventive care can promote health and wellness. Preventive health guidelines for adults include the following key practices.  . A routine yearly physical is a good way to check with your health care provider about your health and preventive screening. It is a chance to share any concerns and updates on your health and to receive a thorough exam.  . Visit your dentist for a routine exam and preventive care every 6 months. Brush your teeth twice a day and floss once a day. Good oral hygiene prevents tooth decay and gum disease.  . The frequency of eye exams is based on your age, health, family medical history, use  of contact lenses, and other factors. Follow your health care provider's ecommendations for frequency of eye exams.  . Eat a healthy diet. Foods like vegetables, fruits, whole grains, low-fat dairy products, and lean protein foods contain the nutrients you need without too many calories. Decrease your intake of foods high in solid fats, added sugars, and salt. Eat the right amount of calories for you. Get information about a proper diet from your health care provider, if necessary.  . Regular physical exercise is one  of the most important things you can do for your health. Most adults should get at least 150 minutes of moderate-intensity exercise (any activity that increases your heart rate and causes you to sweat) each week. In addition, most adults need muscle-strengthening exercises on 2 or more days a week.  Silver Sneakers may be a benefit available to you. To determine eligibility, you may visit the website: www.silversneakers.com or contact program at 218-517-0606 Mon-Fri between 8AM-8PM.   . Maintain a healthy weight. The body mass index (BMI) is a screening tool to identify possible weight problems. It provides an estimate of body fat based on height and weight. Your health care provider can find your BMI and can help you achieve or maintain a healthy weight.   For adults 20 years and older: ? A BMI below 18.5 is considered underweight. ? A BMI of 18.5 to 24.9 is normal. ? A BMI of 25 to 29.9 is considered overweight. ? A BMI of 30 and above is considered obese.   . Maintain normal blood lipids and cholesterol levels by exercising and minimizing your intake of saturated fat. Eat a balanced diet with plenty of fruit and vegetables. Blood tests for lipids and cholesterol should begin at age 37 and be repeated every 5 years. If your lipid or cholesterol levels are high, you are over 50, or you are at high risk for heart disease, you may  need your cholesterol levels checked more frequently. Ongoing high lipid and cholesterol levels should be treated with medicines if diet and exercise are not working.  . If you smoke, find out from your health care provider how to quit. If you do not use tobacco, please do not start.  . If you choose to drink alcohol, please do not consume more than 2 drinks per day. One drink is considered to be 12 ounces (355 mL) of beer, 5 ounces (148 mL) of wine, or 1.5 ounces (44 mL) of liquor.  . If you are 38-33 years old, ask your health care provider if you should take aspirin  to prevent strokes.  . Use sunscreen. Apply sunscreen liberally and repeatedly throughout the day. You should seek shade when your shadow is shorter than you. Protect yourself by wearing long sleeves, pants, a wide-brimmed hat, and sunglasses year round, whenever you are outdoors.  . Once a month, do a whole body skin exam, using a mirror to look at the skin on your back. Tell your health care provider of new moles, moles that have irregular borders, moles that are larger than a pencil eraser, or moles that have changed in shape or color.

## 2016-10-12 NOTE — Progress Notes (Signed)
PCP notes:   Health maintenance:  Flu vaccine - administered  Abnormal screenings:   None  Patient concerns:   None  Nurse concerns:  None  Next PCP appt:   10/16 @ 1430  I reviewed health advisor's note, was available for consultation, and agree with documentation and plan.

## 2016-10-12 NOTE — Progress Notes (Signed)
Subjective:   Pamela Morales is a 76 y.o. female who presents for Medicare Annual (Subsequent) preventive examination.  Review of Systems: N/A Cardiac Risk Factors include: advanced age (>96men, >52 women);hypertension     Objective:     Vitals: BP 110/68 (BP Location: Right Arm, Patient Position: Sitting, Cuff Size: Normal)   Pulse (!) 54   Temp (!) 97.2 F (36.2 C) (Oral)   Ht 4' 11.75" (1.518 m) Comment: no shoes  Wt 137 lb (62.1 kg)   SpO2 94%   BMI 26.98 kg/m   Body mass index is 26.98 kg/m.   Tobacco History  Smoking Status  . Former Smoker  . Quit date: 01/03/1990  Smokeless Tobacco  . Never Used     Counseling given: No   Past Medical History:  Diagnosis Date  . Adenomatous polyp of colon   . CA - cancer of ovary   . Cancer ([place]) 02/10   R breast  (est rec neg and brac neg)  . DDD (degenerative disc disease)    in back with chronic pain  . Depression   . Eczema   . Hypertension   . Osteopenia   . Rosacea   . Zoster 02/09   Past Surgical History:  Procedure Laterality Date  . ABDOMINAL HYSTERECTOMY  1996   BSO fibroids, incidental ovarian CA finding  . BASAL CELL CARCINOMA EXCISION  09/2016  . bladder tack    . BREAST SURGERY     rt total mastectomy for breast CA  . CHOLECYSTECTOMY  08/1996   10 yrs ago  . COLONOSCOPY    . lipoma removal  11/07   Family History  Problem Relation Age of Onset  . Cancer Mother        stomach  . Hypertension Mother   . Heart disease Mother        CAD  . Stomach cancer Mother   . Cancer Father        lung  . Cancer Sister        colon, skin  . Heart disease Sister 85       MI  . Diabetes Sister   . Colon cancer Sister   . Heart disease Brother        HTN and MI  . Hypertension Brother   . Esophageal cancer Neg Hx    History  Sexual Activity  . Sexual activity: No    Outpatient Encounter Prescriptions as of 10/12/2016  Medication Sig  . amLODipine (NORVASC) 10 MG tablet TAKE 1 TABLET (10 MG  TOTAL) BY MOUTH DAILY.  Marland Kitchen Ascorbic Acid (VITAMIN C PO) Take 500 mg by mouth daily.   Marland Kitchen BIOTIN PO Take 5,000 mg by mouth every evening.   . Calcium Carbonate-Vitamin D (CALTRATE 600+D PO) Take 1,200 mg by mouth every evening.   . Cholecalciferol (D3 VITAMIN PO) Take 500 mcg by mouth daily.  . Cyanocobalamin (B-12 PO) Take 5,000 mg by mouth daily.  . Doxylamine Succinate, Sleep, (UNISOM PO) Take 50 mg by mouth at bedtime.  Marland Kitchen FLUoxetine (PROZAC) 20 MG capsule TAKE 1 TO 2 CAPSULES BY MOUTH EVERY DAY  . folic acid (FOLVITE) 500 MCG tablet Take by mouth. 4 tablets once daily  . MAGNESIUM PO Take 500 mg by mouth daily as needed (only takes when working out, for muscle cramps).   . MELATONIN PO Take 10 mg by mouth at bedtime.  . methotrexate (RHEUMATREX) 2.5 MG tablet Take 6 tablets by mouth once a  week.   . metroNIDAZOLE (METROCREAM) 0.75 % cream Apply topically 2 (two) times daily.  . Multiple Vitamin (MULTIVITAMIN) tablet Take 1 tablet by mouth daily.    . TURMERIC CURCUMIN PO Take 500 mg by mouth daily.  . [DISCONTINUED] cetirizine (ZYRTEC) 10 MG tablet Take 10 mg by mouth daily.  . [DISCONTINUED] Melatonin 2.5 MG CAPS Take 10 mg by mouth at bedtime.    No facility-administered encounter medications on file as of 10/12/2016.     Activities of Daily Living In your present state of health, do you have any difficulty performing the following activities: 10/12/2016  Hearing? Y  Vision? N  Difficulty concentrating or making decisions? Y  Walking or climbing stairs? Y  Comment SOB when climbing stairs  Dressing or bathing? N  Doing errands, shopping? N  Preparing Food and eating ? N  Using the Toilet? N  In the past six months, have you accidently leaked urine? N  Do you have problems with loss of bowel control? N  Managing your Medications? N  Managing your Finances? N  Housekeeping or managing your Housekeeping? N  Some recent data might be hidden    Patient Care Team: Tower, Wynelle Fanny, MD as PCP - General Jari Pigg, MD as Consulting Physician (Dermatology) Webb Laws, [place] as Consulting Physician (Optometry) Fanny Skates, MD as Consulting Physician (General Surgery)    Assessment:    Hearing Screening Comments: Bilateral hearing aids Vision Screening Comments: Last vision exam in May 2018 with Dr. Mateo Flow  Exercise Activities and Dietary recommendations Current Exercise Habits: Home exercise routine, Type of exercise: walking;strength training/weights, Time (Minutes): 60, Frequency (Times/Week): 6, Weekly Exercise (Minutes/Week): 360, Intensity: Moderate, Exercise limited by: None identified  Goals    . Increase physical activity          Starting 10/12/2016, I will continue to exercise for 60-90 min 6-7 days per week.       Fall Risk Fall Risk  10/12/2016 03/24/2015 04/28/2014 08/05/2012  Falls in the past year? No No No No   Depression Screen PHQ 2/9 Scores 10/12/2016 03/24/2015 08/05/2012  PHQ - 2 Score 0 0 0  PHQ- 9 Score 0 - -     Cognitive Function MMSE - Mini Mental State Exam 10/12/2016 03/24/2015  Orientation to time 5 5  Orientation to Place 5 5  Registration 3 3  Attention/ Calculation 0 5  Recall 3 1  Language- name 2 objects 0 0  Language- repeat 1 1  Language- follow 3 step command 3 3  Language- read & follow direction 0 1  Write a sentence 0 0  Copy design 0 0  Total score 20 24     PLEASE NOTE: A Mini-Cog screen was completed. Maximum score is 20. A value of 0 denotes this part of Folstein MMSE was not completed or the patient failed this part of the Mini-Cog screening.   Mini-Cog Screening Orientation to Time - Max 5 pts Orientation to Place - Max 5 pts Registration - Max 3 pts Recall - Max 3 pts Language Repeat - Max 1 pts Language Follow 3 Step Command - Max 3 pts     Immunization History  Administered Date(s) Administered  . Influenza Split 10/11/2011  . Influenza Whole 10/04/2006  . Influenza, High Dose  Seasonal PF 10/12/2016  . Influenza,inj,Quad PF,6+ Mos 09/19/2012, 10/31/2013, 10/10/2014  . Influenza-Unspecified 09/18/2015  . Pneumococcal Conjugate-13 04/28/2014  . Pneumococcal Polysaccharide-23 08/03/2012  . Td 01/03/1994, 07/17/2008   Screening Tests  Health Maintenance  Topic Date Due  . MAMMOGRAM  08/30/2017  . COLONOSCOPY  01/18/2018  . TETANUS/TDAP  07/18/2018  . INFLUENZA VACCINE  Completed  . DEXA SCAN  Completed  . PNA vac Low Risk Adult  Completed      Plan:     I have personally reviewed and addressed the Medicare Annual Wellness questionnaire and have noted the following in the patient's chart:  A. Medical and social history B. Use of alcohol, tobacco or illicit drugs  C. Current medications and supplements D. Functional ability and status E.  Nutritional status F.  Physical activity G. Advance directives H. List of other physicians I.  Hospitalizations, surgeries, and ER visits in previous 12 months J.  [place] to include hearing, vision, cognitive, depression L. Referrals and appointments - none  In addition, I have reviewed and discussed with patient certain preventive protocols, quality metrics, and best practice recommendations. A written personalized care plan for preventive services as well as general preventive health recommendations were provided to patient.  See attached scanned questionnaire for additional information.   Signed,   Lindell Noe, MHA, BS, LPN Health Coach

## 2016-10-18 ENCOUNTER — Ambulatory Visit (INDEPENDENT_AMBULATORY_CARE_PROVIDER_SITE_OTHER): Payer: PPO | Admitting: Family Medicine

## 2016-10-18 ENCOUNTER — Encounter: Payer: Self-pay | Admitting: Family Medicine

## 2016-10-18 VITALS — BP 118/62 | HR 52 | Temp 97.9°F | Ht 60.0 in | Wt 137.8 lb

## 2016-10-18 DIAGNOSIS — Z0001 Encounter for general adult medical examination with abnormal findings: Secondary | ICD-10-CM

## 2016-10-18 DIAGNOSIS — L4050 Arthropathic psoriasis, unspecified: Secondary | ICD-10-CM | POA: Diagnosis not present

## 2016-10-18 DIAGNOSIS — I10 Essential (primary) hypertension: Secondary | ICD-10-CM | POA: Diagnosis not present

## 2016-10-18 DIAGNOSIS — R062 Wheezing: Secondary | ICD-10-CM | POA: Diagnosis not present

## 2016-10-18 DIAGNOSIS — R7989 Other specified abnormal findings of blood chemistry: Secondary | ICD-10-CM | POA: Diagnosis not present

## 2016-10-18 DIAGNOSIS — M8580 Other specified disorders of bone density and structure, unspecified site: Secondary | ICD-10-CM

## 2016-10-18 DIAGNOSIS — Z0000 Encounter for general adult medical examination without abnormal findings: Secondary | ICD-10-CM

## 2016-10-18 MED ORDER — AMLODIPINE BESYLATE 10 MG PO TABS: 10.0000 mg | ORAL_TABLET | Freq: Every day | ORAL | 3 refills | Status: DC

## 2016-10-18 MED ORDER — FLUOXETINE HCL 20 MG PO CAPS: ORAL_CAPSULE | ORAL | 3 refills | Status: DC

## 2016-10-18 NOTE — Patient Instructions (Addendum)
We will refer you to pulmonary for breathing problems   Cholesterol is fair Keep exercising  Avoid red meat/ fried foods/ egg yolks/ fatty breakfast meats/ butter, cheese and high fat dairy/ and shellfish   For triglycerides - don't overdue carbohydrates   Increase water intake to 64 oz day  I want to re check kidney function in 1 month  Thyroid is borderline (the biotin you are taking may affect this -please hold it for now)  Re checking that in 1 mo   Stop at check out for future lab appt

## 2016-10-18 NOTE — Progress Notes (Signed)
Subjective:    Patient ID: Pamela Morales, female    DOB: 14-Nov-1940, 76 y.o.   MRN: [id number]  HPI  Here for health maintenance exam and to review chronic medical problems  Feeling good overall  Staying active  Breathing still bothers her a bit -not too bad /is able to keep working out through it  [employer] about copd /did used to smoke  She is interested in seeing an lung specialist      Wt Readings from Last 3 Encounters:  10/18/16 137 lb 12 oz (62.5 kg)  10/12/16 137 lb (62.1 kg)  03/23/16 135 lb 8 oz (61.5 kg)  stable - good diet and exercise  26.90 kg/m  Had amw 10/10 Given flu vaccine   Mammogram 8/18 nl  H/o breast cancer with R total mastectomy  Self breast exam -no lumps   H/o total hysterectomy (ov ca) No gyn issues or problems   Colonoscopy 1/15 5 year recall  Sister had colon cancer  Personal hx of polyps   dexa 8/18 osteopenia /slt worse than prev No falls or fractures  Exercises  Taking ca and D  [facility]   bp is stable today  No cp or palpitations or headaches or edema  No side effects to medicines  BP Readings from Last 3 Encounters:  10/18/16 118/62  10/12/16 110/68  03/23/16 116/62     Lab Results  Component Value Date   CREATININE 1.58 (H) 10/12/2016   BUN 29 (H) 10/12/2016   NA 138 10/12/2016   K 4.9 10/12/2016   CL 102 10/12/2016   CO2 29 10/12/2016   Cr is up from 1.3 to 1.58  Thinks she gets 32 oz water daily  Not enough  Is on methotrexate  Lab Results  Component Value Date   ALT 19 10/12/2016   AST 25 10/12/2016   ALKPHOS 66 10/12/2016   BILITOT 0.2 10/12/2016    Lab Results  Component Value Date   WBC 10.5 10/12/2016   HGB 12.0 10/12/2016   HCT 36.9 10/12/2016   MCV 87.3 10/12/2016   PLT 285.0 10/12/2016    Cholesterol  Lab Results  Component Value Date   CHOL 215 (H) 10/12/2016   CHOL 206 (H) 03/23/2016   CHOL 227 (H) 08/03/2012   Lab Results  Component Value Date   HDL  40.40 10/12/2016   HDL 47.30 03/23/2016   HDL 46.70 08/03/2012   Lab Results  Component Value Date   LDLCALC 112 (H) 07/17/2008   LDLCALC 97 02/05/2007   Lab Results  Component Value Date   TRIG 251.0 (H) 10/12/2016   TRIG 202.0 (H) 03/23/2016   TRIG 217.0 (H) 08/03/2012   Lab Results  Component Value Date   CHOLHDL 5 10/12/2016   CHOLHDL 4 03/23/2016   CHOLHDL 5 08/03/2012   Lab Results  Component Value Date   LDLDIRECT 124.0 10/12/2016   LDLDIRECT 121.0 03/23/2016   LDLDIRECT 135.6 08/03/2012   Glucose was 105  Lab Results  Component Value Date   TSH 5.67 (H) 10/12/2016    She has had hair loss (from medication) for years  occ fatigue  No change in mood  She does take biotin daily (for hair)-unsure If it works and did not know it could affect thyroid labs   Has psoriatic arthritis Doing well with methotrexate    Patient Active Problem List   Diagnosis Date Noted  . Elevated TSH 10/18/2016  . Elevated  serum creatinine 10/18/2016  . Dyspnea on exertion 03/23/2016  . Estrogen deficiency 03/23/2016  . Encounter for screening mammogram for breast cancer 03/23/2016  . Psoriatic arthritis ([facility]) 03/31/2015  . Routine general medical examination at a health care facility 03/25/2015  . Heartburn 03/03/2015  . Wheezing 01/09/2015  . Fatigue 04/28/2014  . Family history of colon cancer 01/01/2013  . Personal history of colonic polyps 01/01/2013  . Colon cancer screening 12/19/2012  . Swelling of joint of right wrist 10/23/2012  . Right wrist pain 10/19/2012  . Hearing loss 09/19/2012  . 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 Sleep disorder 01/07/2011  . Colon polyps 07/16/2010  . BACK PAIN WITH RADICULOPATHY 03/15/2007  . History of herpes zoster 02/23/2007  . Essential hypertension 02/05/2007  . Osteopenia 02/05/2007  . ECZEMA, ATOPIC 01/29/2007  . ROSACEA 01/29/2007  . BASAL CELL CARCINOMA, HX OF 01/29/2007   Past Medical History:    Diagnosis Date  . Adenomatous polyp of colon   . CA - cancer of ovary   . Cancer ([place]) 02/10   R breast  (est rec neg and brac neg)  . DDD (degenerative disc disease)    in back with chronic pain  . Depression   . Eczema   . Hypertension   . Osteopenia   . Rosacea   . Zoster 02/09   Past Surgical History:  Procedure Laterality Date  . ABDOMINAL HYSTERECTOMY  1996   BSO fibroids, incidental ovarian CA finding  . BASAL CELL CARCINOMA EXCISION  09/2016  . bladder tack    . BREAST SURGERY     rt total mastectomy for breast CA  . CHOLECYSTECTOMY  08/1996   10 yrs ago  . COLONOSCOPY    . lipoma removal  11/07   Social History  Substance Use Topics  . Smoking status: Former Smoker    Quit date: 01/03/1990  . Smokeless tobacco: Never Used  . Alcohol use 4.2 oz/week    2 Standard drinks or equivalent, 5 Glasses of wine per week   Family History  Problem Relation Age of Onset  . Cancer Mother        stomach  . Hypertension Mother   . Heart disease Mother        CAD  . Stomach cancer Mother   . Cancer Father        lung  . Cancer Sister        colon, skin  . Heart disease Sister 61       MI  . Diabetes Sister   . Colon cancer Sister   . Heart disease Brother        HTN and MI  . Hypertension Brother   . Esophageal cancer Neg Hx    Allergies  Allergen Reactions  . Sulfa Antibiotics    Current Outpatient Prescriptions on File Prior to Visit  Medication Sig Dispense Refill  . Ascorbic Acid (VITAMIN C PO) Take 500 mg by mouth daily.     Marland Kitchen BIOTIN PO Take 5,000 mg by mouth every evening.     . Calcium Carbonate-Vitamin D (CALTRATE 600+D PO) Take 1,200 mg by mouth every evening.     . Cholecalciferol (D3 VITAMIN PO) Take 500 mcg by mouth daily.    . Cyanocobalamin (B-12 PO) Take 5,000 mg by mouth daily.    . Doxylamine Succinate, Sleep, (UNISOM PO) Take 50 mg by mouth at bedtime.    . folic acid (FOLVITE) 967 MCG tablet  Take by mouth. 4 tablets once daily    .  MAGNESIUM PO Take 500 mg by mouth daily as needed (only takes when working out, for muscle cramps).     . MELATONIN PO Take 10 mg by mouth at bedtime.    . methotrexate (RHEUMATREX) 2.5 MG tablet Take 6 tablets by mouth once a week.     . metroNIDAZOLE (METROCREAM) 0.75 % cream Apply topically 2 (two) times daily. 60 g 3  . Multiple Vitamin (MULTIVITAMIN) tablet Take 1 tablet by mouth daily.      . TURMERIC CURCUMIN PO Take 500 mg by mouth daily.     No current facility-administered medications on file prior to visit.     Review of Systems  Constitutional: Positive for fatigue. Negative for activity change, appetite change, fever and unexpected weight change.  HENT: Negative for congestion, ear pain, rhinorrhea, sinus pressure and sore throat.   Eyes: Negative for pain, redness and visual disturbance.  Respiratory: Negative for cough, shortness of breath and wheezing.   Cardiovascular: Negative for chest pain and palpitations.  Gastrointestinal: Negative for abdominal pain, blood in stool, constipation and diarrhea.  Endocrine: Negative for polydipsia and polyuria.  Genitourinary: Negative for dysuria, frequency and urgency.  Musculoskeletal: Positive for arthralgias. Negative for back pain and myalgias.  Skin: Negative for pallor and rash.  Allergic/Immunologic: Negative for environmental allergies.  Neurological: Negative for dizziness, syncope and headaches.  Hematological: Negative for adenopathy. Does not bruise/bleed easily.  Psychiatric/Behavioral: Negative for decreased concentration and dysphoric mood. The patient is not nervous/anxious.        Objective:   Physical Exam  Constitutional: She appears well-developed and well-nourished. No distress.  Well appearing   HENT:  Head: Normocephalic and atraumatic.  Right Ear: External ear normal.  Left Ear: External ear normal.  Mouth/Throat: Oropharynx is clear and moist.  Eyes: Pupils are equal, round, and reactive to light.  Conjunctivae and EOM are normal. No scleral icterus.  Neck: Normal range of motion. Neck supple. No JVD present. Carotid bruit is not present. No thyromegaly present.  Cardiovascular: Normal rate, regular rhythm, normal heart sounds and intact distal pulses.  Exam reveals no gallop.   Pulmonary/Chest: Effort normal. No respiratory distress. She has wheezes. She exhibits no tenderness.  Scant wheeze on forced exp Distant bs  Abdominal: Soft. Bowel sounds are normal. She exhibits no distension, no abdominal bruit and no mass. There is no tenderness.  Genitourinary: No breast swelling, tenderness, discharge or bleeding.  Genitourinary Comments: S/p R total mastectomy with reconstruction   L breast:  Breast exam: No mass, nodules, thickening, tenderness, bulging, retraction, inflamation, nipple discharge or skin changes noted.  No axillary or clavicular LA.      Musculoskeletal: Normal range of motion. She exhibits no edema or tenderness.  Lymphadenopathy:    She has no cervical adenopathy.  Neurological: She is alert. She has normal reflexes. No cranial nerve deficit. She exhibits normal muscle tone. Coordination normal.  Skin: Skin is warm and dry. No rash noted. No erythema. No pallor.  Solar lentigines diffusely Some sks   Baseline psoriasis  Psychiatric: She has a normal mood and affect.          Assessment & Plan:   Problem List Items Addressed This Visit      Cardiovascular and Mediastinum   Essential hypertension    bp in fair control at this time  BP Readings from Last 1 Encounters:  10/18/16 118/62   No changes needed Disc  lifstyle change with low sodium diet and exercise  Labs rev       Relevant Medications   amLODipine (NORVASC) 10 MG tablet     Musculoskeletal and Integument   Osteopenia    dexa 8/18 rev  No falls or fx  Continue exercise and ca and D  Re check 2 y      Psoriatic arthritis ([place])    On methotrexate  Stable Very active Continue rheum  f/u        Other   Elevated serum creatinine    Lab Results  Component Value Date   CREATININE 1.58 (H) 10/12/2016   Enc inc water intake to 64 oz per day  ? If med related/ MTX Re check 1 mo Avoid nsaids      Elevated TSH    ? If rel to biotin intake Will hold this  Re check 1 mo with FT4 ? If symptomatic (hair loss)      Routine general medical examination at a health care facility - Primary    Reviewed health habits including diet and exercise and skin cancer prevention Reviewed appropriate screening tests for age  Also reviewed health mt list, fam hx and immunization status , as well as social and family history   See hPI amw reviewed Due colonoscopy 2020  Continue exercise  Watch renal/thyroid fxn      Wheezing    Ongoing since an exposure  Ref to pulmonary  Prior smoker / ? If poss early copd      Relevant Orders   Ambulatory referral to Pulmonology

## 2016-10-19 NOTE — Assessment & Plan Note (Signed)
?   If rel to biotin intake Will hold this  Re check 1 mo with FT4 ? If symptomatic (hair loss)

## 2016-10-19 NOTE — Assessment & Plan Note (Signed)
Reviewed health habits including diet and exercise and skin cancer prevention Reviewed appropriate screening tests for age  Also reviewed health mt list, fam hx and immunization status , as well as social and family history   See hPI amw reviewed Due colonoscopy 2020  Continue exercise  Watch renal/thyroid fxn

## 2016-10-19 NOTE — Assessment & Plan Note (Signed)
dexa 8/18 rev  No falls or fx  Continue exercise and ca and D  Re check 2 y

## 2016-10-19 NOTE — Assessment & Plan Note (Signed)
On methotrexate  Stable Very active Continue rheum f/u

## 2016-10-19 NOTE — Assessment & Plan Note (Signed)
Ongoing since an exposure  Ref to pulmonary  Prior smoker / ? If poss early copd

## 2016-10-19 NOTE — Assessment & Plan Note (Signed)
Lab Results  Component Value Date   CREATININE 1.58 (H) 10/12/2016   Enc inc water intake to 64 oz per day  ? If med related/ MTX Re check 1 mo Avoid nsaids

## 2016-10-19 NOTE — Assessment & Plan Note (Signed)
bp in fair control at this time  BP Readings from Last 1 Encounters:  10/18/16 118/62   No changes needed Disc lifstyle change with low sodium diet and exercise  Labs rev

## 2016-10-21 ENCOUNTER — Institutional Professional Consult (permissible substitution): Payer: PPO | Admitting: Internal Medicine

## 2016-10-21 NOTE — Progress Notes (Signed)
[facility] Consultation      Assessment and Plan:  76 year old female with progressive dyspnea on exertion.  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 -Hyperinflation noted on chest x-ray, which appears to be consistent with emphysema. - Prescribed Spiriva to be used once daily.  I also recommended that she continue her activity, and continue to refrain from smoking. -I have ordered a alpha-1 antitrypsin deficiency screening test, as well as a full pulmonary function test.  Meds ordered this encounter  Medications  . tiotropium (SPIRIVA) 18 MCG inhalation capsule    Sig: Place 1 capsule (18 mcg total) into inhaler and inhale daily.    Dispense:  30 capsule    Refill:  12  . albuterol (PROAIR HFA) 108 (90 Base) MCG/ACT inhaler    Sig: Inhale 2 puffs into the lungs every 6 (six) hours as needed for wheezing or shortness of breath (2 puffs before exercise or activity).    Dispense:  1 Inhaler    Refill:  6   Orders Placed This Encounter  Procedures  . Pulmonary Function Test [facility] Only    Return in about 6 months (around 04/24/2017).   Date: 10/24/2016  MRN# [id number] Pamela Morales July 01, 1940    Pamela Morales is a 76 y.o. old female seen in consultation for chief complaint of:    Chief Complaint  Patient presents with  . Advice Only    referred by Dr. Mat Carne  . Wheezing    Pt c/o 1 yr hx sob/wheezing/chest tightness but denies cough    HPI:  She notices that she has been having dyspnea on exertion, this has been going on for about 2 years or more. This has been slowly progressive. She is fairly active with working in the yard the yard, she goes to the gym everyday doing mostly aerobic activity.  She can not do as much as she used to about 2 years ago. She denies any chest pain, she tells me that she had a stress test a few years ago which was normal.  She does snore at night, no witness apneas.  She used to smoke, up to 3 ppd, quit at the age of 6.  Father smoked and died of lung cancer.  No occupational exposure.  She has been tried on an inhaler a year ago because she had an episode of bronchitis but the inhalers did not help.  She still notices that she wheezes occasionally.    Images personally reviewed; CXR 03/23/16; Hyperinflation c/w emphysema.    PMHX:   Past Medical History:  Diagnosis Date  . Adenomatous polyp of colon   . CA - cancer of ovary   . Cancer (Reform) 02/10   R breast  (est rec neg and brac neg)  . DDD (degenerative disc disease)    in back with chronic pain  . Depression   . Eczema   . Hypertension   . Osteopenia   . Rosacea   . Zoster 02/09   Surgical Hx:  Past Surgical History:  Procedure Laterality Date  . ABDOMINAL HYSTERECTOMY  1996   BSO fibroids, incidental ovarian CA finding  . BASAL CELL CARCINOMA EXCISION  09/2016  . bladder tack    . BREAST SURGERY     rt total mastectomy for breast CA  . CHOLECYSTECTOMY  08/1996   10 yrs ago  . COLONOSCOPY    . lipoma removal  11/07   Family Hx:  Family History  Problem Relation Age of  Onset  . Cancer Mother        stomach  . Hypertension Mother   . Heart disease Mother        CAD  . Stomach cancer Mother   . Cancer Father        lung  . Cancer Sister        colon, skin  . Heart disease Sister 29       MI  . Diabetes Sister   . Colon cancer Sister   . Heart disease Brother        HTN and MI  . Hypertension Brother   . Esophageal cancer Neg Hx    Social Hx:   Social History  Substance Use Topics  . Smoking status: Former Smoker    Quit date: 01/03/1990  . Smokeless tobacco: Never Used  . Alcohol use 4.2 oz/week    2 Standard drinks or equivalent, 5 Glasses of wine per week   Medication:    Current Outpatient Prescriptions:  .  amLODipine (NORVASC) 10 MG tablet, Take 1 tablet (10 mg total) by mouth daily., Disp: 90 tablet, Rfl: 3 .  Ascorbic Acid (VITAMIN C PO), Take 500 mg by mouth daily. , Disp: , Rfl:  .  BIOTIN PO, Take  5,000 mg by mouth every evening. , Disp: , Rfl:  .  Calcium Carbonate-Vitamin D (CALTRATE 600+D PO), Take 1,200 mg by mouth every evening. , Disp: , Rfl:  .  Cholecalciferol (D3 VITAMIN PO), Take 500 mcg by mouth daily., Disp: , Rfl:  .  Cyanocobalamin (B-12 PO), Take 5,000 mg by mouth daily., Disp: , Rfl:  .  Doxylamine Succinate, Sleep, (UNISOM PO), Take 50 mg by mouth at bedtime., Disp: , Rfl:  .  FLUoxetine (PROZAC) 20 MG capsule, TAKE 1 TO 2 CAPSULES BY MOUTH EVERY DAY, Disp: 180 capsule, Rfl: 3 .  folic acid (FOLVITE) 048 MCG tablet, Take by mouth. 4 tablets once daily, Disp: , Rfl:  .  MAGNESIUM PO, Take 500 mg by mouth daily as needed (only takes when working out, for muscle cramps). , Disp: , Rfl:  .  MELATONIN PO, Take 10 mg by mouth at bedtime., Disp: , Rfl:  .  methotrexate (RHEUMATREX) 2.5 MG tablet, Take 6 tablets by mouth once a week. , Disp: , Rfl:  .  metroNIDAZOLE (METROCREAM) 0.75 % cream, Apply topically 2 (two) times daily., Disp: 60 g, Rfl: 3 .  Multiple Vitamin (MULTIVITAMIN) tablet, Take 1 tablet by mouth daily.  , Disp: , Rfl:  .  TURMERIC CURCUMIN PO, Take 500 mg by mouth daily., Disp: , Rfl:    Allergies:  Sulfa antibiotics  Review of Systems: Gen:  Denies  fever, sweats, chills HEENT: Denies blurred vision, double vision. bleeds, sore throat Cvc:  No dizziness, chest pain. Resp:   Denies cough or sputum production, shortness of breath Gi: Denies swallowing difficulty, stomach pain. Gu:  Denies bladder incontinence, burning urine Ext:   No Joint pain, stiffness. Skin: No skin rash,  hives  Endoc:  No polyuria, polydipsia. Psych: No depression, insomnia. Other:  All other systems were reviewed with the patient and were negative other that what is mentioned in the HPI.   Physical Examination:   VS: BP 140/80 (BP Location: Left Arm, Cuff Size: Normal)   Pulse (!) 58   Resp 16   Ht 5' (1.524 m)   Wt 138 lb 12.8 oz (63 kg)   SpO2 98%   BMI 27.11 kg/m  General Appearance: No distress  Neuro:without focal findings,  speech normal,  HEENT: PERRLA, EOM intact.   Pulmonary: normal breath sounds, No wheezing.  Decreased air entry bilaterally. CardiovascularNormal S1,S2.  No m/r/g.   Abdomen: Benign, Soft, non-tender. Renal:  No costovertebral tenderness  GU:  No performed at this time. Endoc: No evident thyromegaly, no signs of acromegaly. Skin:   warm, no rashes, no ecchymosis  Extremities: normal, no cyanosis, clubbing.  Other findings:    LABORATORY PANEL:   CBC No results for input(s): WBC, HGB, HCT, PLT in the last 168 hours. ------------------------------------------------------------------------------------------------------------------  Chemistries  No results for input(s): NA, K, CL, CO2, GLUCOSE, BUN, CREATININE, CALCIUM, MG, AST, ALT, ALKPHOS, BILITOT in the last 168 hours.  Invalid input(s): GFRCGP ------------------------------------------------------------------------------------------------------------------  Cardiac Enzymes No results for input(s): TROPONINI in the last 168 hours. ------------------------------------------------------------  RADIOLOGY:  No results found.     Thank  you for the consultation and for allowing [facility] to assist in the care of your patient. Our recommendations are noted above.  Please contact us if we can be of further service.   Marda Stalker, MD.  Board Certified in Internal Medicine, Pulmonary Medicine, [place], and Sleep Medicine.   [facility] Critical Care Office Number: [phone]  Patricia Pesa, M.D.  Merton Border, M.D  10/24/2016

## 2016-10-24 ENCOUNTER — Ambulatory Visit (INDEPENDENT_AMBULATORY_CARE_PROVIDER_SITE_OTHER): Payer: PPO | Admitting: Internal Medicine

## 2016-10-24 ENCOUNTER — Encounter: Payer: Self-pay | Admitting: Internal Medicine

## 2016-10-24 VITALS — BP 140/80 | HR 58 | Resp 16 | Ht 60.0 in | Wt 138.8 lb

## 2016-10-24 DIAGNOSIS — J449 Chronic obstructive pulmonary disease, unspecified: Secondary | ICD-10-CM | POA: Diagnosis not present

## 2016-10-24 MED ORDER — ALBUTEROL SULFATE HFA 108 (90 BASE) MCG/ACT IN AERS: 2.0000 | INHALATION_SPRAY | Freq: Four times a day (QID) | RESPIRATORY_TRACT | 6 refills | Status: DC | PRN

## 2016-10-24 MED ORDER — TIOTROPIUM BROMIDE MONOHYDRATE 18 MCG IN CAPS: 18.0000 ug | ORAL_CAPSULE | Freq: Every day | RESPIRATORY_TRACT | 12 refills | Status: DC

## 2016-10-24 NOTE — Patient Instructions (Signed)
--  Start spiriva once daily.  --Start proair "rescue" inhaler 2 puffs before exercise.

## 2016-11-01 DIAGNOSIS — L409 Psoriasis, unspecified: Secondary | ICD-10-CM | POA: Diagnosis not present

## 2016-11-01 DIAGNOSIS — Z79899 Other long term (current) drug therapy: Secondary | ICD-10-CM | POA: Diagnosis not present

## 2016-11-01 DIAGNOSIS — M150 Primary generalized (osteo)arthritis: Secondary | ICD-10-CM | POA: Diagnosis not present

## 2016-11-01 DIAGNOSIS — M2550 Pain in unspecified joint: Secondary | ICD-10-CM | POA: Diagnosis not present

## 2016-11-01 DIAGNOSIS — L4050 Arthropathic psoriasis, unspecified: Secondary | ICD-10-CM | POA: Diagnosis not present

## 2016-11-01 DIAGNOSIS — E663 Overweight: Secondary | ICD-10-CM | POA: Diagnosis not present

## 2016-11-01 DIAGNOSIS — Z6825 Body mass index (BMI) 25.0-25.9, adult: Secondary | ICD-10-CM | POA: Diagnosis not present

## 2016-11-01 DIAGNOSIS — L659 Nonscarring hair loss, unspecified: Secondary | ICD-10-CM | POA: Diagnosis not present

## 2016-11-03 ENCOUNTER — Telehealth: Payer: Self-pay | Admitting: Family Medicine

## 2016-11-03 DIAGNOSIS — R7989 Other specified abnormal findings of blood chemistry: Secondary | ICD-10-CM

## 2016-11-03 NOTE — Telephone Encounter (Signed)
Pt aware/she is now scheduled for 11.26.18 @ 2:30pm for lab visit/thx dmf

## 2016-11-03 NOTE — Telephone Encounter (Signed)
Copied from [place] [phone]. Topic: Appointment Scheduling - Scheduling Inquiry for Clinic >> Nov 02, 2016  3:45 PM Arletha Grippe wrote: Reason for CRM: pt needs thyroid lab orders put in , she needs to have lab appt mid November  cb number [phone]   Order was done

## 2016-11-03 NOTE — Telephone Encounter (Signed)
Order was created by provider/thx dmf

## 2016-11-03 NOTE — Telephone Encounter (Signed)
Copied from [place] [phone]. Topic: Referral - Medical Records >> Nov 02, 2016  3:47 PM Arletha Grippe wrote: Reason for CRM: needs latest blood work up sent to fax [phone]  Please send to erin gray at [facility] rheumatology   11.01.2018 @ 12:17pm/faxed per pt request/thx dmf

## 2016-11-05 ENCOUNTER — Other Ambulatory Visit: Payer: Self-pay | Admitting: Family Medicine

## 2016-11-08 ENCOUNTER — Ambulatory Visit: Payer: PPO | Attending: Internal Medicine

## 2016-11-08 DIAGNOSIS — J439 Emphysema, unspecified: Secondary | ICD-10-CM | POA: Insufficient documentation

## 2016-11-08 DIAGNOSIS — J449 Chronic obstructive pulmonary disease, unspecified: Secondary | ICD-10-CM | POA: Diagnosis not present

## 2016-11-28 ENCOUNTER — Telehealth: Payer: Self-pay

## 2016-11-28 ENCOUNTER — Other Ambulatory Visit (INDEPENDENT_AMBULATORY_CARE_PROVIDER_SITE_OTHER): Payer: PPO

## 2016-11-28 DIAGNOSIS — R7989 Other specified abnormal findings of blood chemistry: Secondary | ICD-10-CM

## 2016-11-28 LAB — T4, FREE: Free T4: 0.81 ng/dL (ref 0.60–1.60)

## 2016-11-28 LAB — TSH: TSH: 5.03 u[IU]/mL — AB (ref 0.35–4.50)

## 2016-11-28 NOTE — Telephone Encounter (Signed)
Please do so when they return

## 2016-11-28 NOTE — Telephone Encounter (Signed)
Pt was in for her labs to be drawn today and is requesting copy of labs to be faxed to Rheum erin gray at [facility] rheumatology    [phone]

## 2016-11-30 ENCOUNTER — Ambulatory Visit: Payer: PPO | Admitting: Family Medicine

## 2016-12-01 ENCOUNTER — Encounter: Payer: Self-pay | Admitting: *Deleted

## 2016-12-02 NOTE — Telephone Encounter (Signed)
Labs faxed to [facility].

## 2016-12-16 ENCOUNTER — Telehealth: Payer: Self-pay | Admitting: Internal Medicine

## 2016-12-16 NOTE — Telephone Encounter (Signed)
Informed patient results would be discussed at next f/u appt. Nothing further needed.

## 2016-12-16 NOTE — Telephone Encounter (Signed)
Pt would like breathing test results.

## 2017-01-17 DIAGNOSIS — D1800 Hemangioma unspecified site: Secondary | ICD-10-CM | POA: Diagnosis not present

## 2017-01-17 DIAGNOSIS — Z85828 Personal history of other malignant neoplasm of skin: Secondary | ICD-10-CM | POA: Diagnosis not present

## 2017-01-17 DIAGNOSIS — Z808 Family history of malignant neoplasm of other organs or systems: Secondary | ICD-10-CM | POA: Diagnosis not present

## 2017-01-17 DIAGNOSIS — D2261 Melanocytic nevi of right upper limb, including shoulder: Secondary | ICD-10-CM | POA: Diagnosis not present

## 2017-01-17 DIAGNOSIS — L570 Actinic keratosis: Secondary | ICD-10-CM | POA: Diagnosis not present

## 2017-01-17 DIAGNOSIS — D2262 Melanocytic nevi of left upper limb, including shoulder: Secondary | ICD-10-CM | POA: Diagnosis not present

## 2017-01-17 DIAGNOSIS — Z86018 Personal history of other benign neoplasm: Secondary | ICD-10-CM | POA: Diagnosis not present

## 2017-01-17 DIAGNOSIS — Z23 Encounter for immunization: Secondary | ICD-10-CM | POA: Diagnosis not present

## 2017-01-17 DIAGNOSIS — D225 Melanocytic nevi of trunk: Secondary | ICD-10-CM | POA: Diagnosis not present

## 2017-01-17 DIAGNOSIS — D2272 Melanocytic nevi of left lower limb, including hip: Secondary | ICD-10-CM | POA: Diagnosis not present

## 2017-01-31 DIAGNOSIS — L4050 Arthropathic psoriasis, unspecified: Secondary | ICD-10-CM | POA: Diagnosis not present

## 2017-04-24 NOTE — Progress Notes (Addendum)
[facility] Consultation      Assessment and Plan:  77 year old female with progressive dyspnea on exertion.  Chronic bronchitis. -Hyperinflation noted on chest x-ray, which appears to be consistent with emphysema. -She is getting over a cold and has some residual symptoms.  --Continue spiriva, she is given a sample of Brio today, asked to call us back if it is helpful, and we can give her a prescription for Brio or Advair.   Return in about 6 months (around 10/25/2017).   Date: 04/24/2017  MRN# [id number] ICIS BUDREAU 04/07/1940    Pamela Morales is a 77 y.o. old female seen in consultation for chief complaint of:    Chief Complaint  Patient presents with  . COPD    pt states she ha a cold right now  . Wheezing    HPI:  The patient is a 77 year old female with progressive dyspnea over the past few years, she used to be a heavy smoker quit approximately 30 years ago.  Last visit she was prescribed Spiriva, and I asked her to try to increase her physical activity. She does snore at night, no witness apneas.  She used to smoke, up to 3 ppd, quit at the age of 50. Father smoked and died of lung cancer.  No occupational exposure.   She feels that her breathing is short with mild to moderate activity. She is using spiriva once daily and feels that it helps. She had a recent cold and feels that it made her breathing worse and she is still recovering.  She is not a smoker.   **Alpha-1 testing negative. **PFT tracings personally reviewed, 07/08/16; FVC is 99% predicted, FEV1 is 89% predicted ratio 70% no bronchodilator was administered. Lung volumes TLC is 111%, RV/TLC ratio is normal flow volume loop is normal.  Diffusion capacity is 83% predicted. - Overall this test shows normal pulmonary functions without evidence of COPD/emphysema. **Images personally reviewed; CXR 03/23/16; Hyperinflation c/w emphysema.   Medication:    Current Outpatient Medications:  .   albuterol (PROAIR HFA) 108 (90 Base) MCG/ACT inhaler, Inhale 2 puffs into the lungs every 6 (six) hours as needed for wheezing or shortness of breath (2 puffs before exercise or activity)., Disp: 1 Inhaler, Rfl: 6 .  amLODipine (NORVASC) 10 MG tablet, Take 1 tablet (10 mg total) by mouth daily., Disp: 90 tablet, Rfl: 3 .  Ascorbic Acid (VITAMIN C PO), Take 500 mg by mouth daily. , Disp: , Rfl:  .  BIOTIN PO, Take 5,000 mg by mouth every evening. , Disp: , Rfl:  .  Calcium Carbonate-Vitamin D (CALTRATE 600+D PO), Take 1,200 mg by mouth every evening. , Disp: , Rfl:  .  Cholecalciferol (D3 VITAMIN PO), Take 500 mcg by mouth daily., Disp: , Rfl:  .  Cyanocobalamin (B-12 PO), Take 5,000 mg by mouth daily., Disp: , Rfl:  .  Doxylamine Succinate, Sleep, (UNISOM PO), Take 50 mg by mouth at bedtime., Disp: , Rfl:  .  FLUoxetine (PROZAC) 20 MG capsule, TAKE 1 TO 2 CAPSULES BY MOUTH EVERY DAY, Disp: 180 capsule, Rfl: 3 .  folic acid (FOLVITE) 242 MCG tablet, Take by mouth. 4 tablets once daily, Disp: , Rfl:  .  MAGNESIUM PO, Take 500 mg by mouth daily as needed (only takes when working out, for muscle cramps). , Disp: , Rfl:  .  MELATONIN PO, Take 10 mg by mouth at bedtime., Disp: , Rfl:  .  methotrexate (RHEUMATREX) 2.5 MG tablet,  Take 6 tablets by mouth once a week. , Disp: , Rfl:  .  metroNIDAZOLE (METROCREAM) 0.75 % cream, Apply topically 2 (two) times daily., Disp: 60 g, Rfl: 3 .  Multiple Vitamin (MULTIVITAMIN) tablet, Take 1 tablet by mouth daily.  , Disp: , Rfl:  .  tiotropium (SPIRIVA) 18 MCG inhalation capsule, Place 1 capsule (18 mcg total) into inhaler and inhale daily., Disp: 30 capsule, Rfl: 12 .  TURMERIC CURCUMIN PO, Take 500 mg by mouth daily., Disp: , Rfl:    Allergies:  Sulfa antibiotics  Review of Systems: Gen:  Denies  fever, sweats, chills HEENT: Denies blurred vision, double vision. bleeds, sore throat Cvc:  No dizziness, chest pain. Resp:   Denies cough or sputum production,  shortness of breath Gi: Denies swallowing difficulty, stomach pain. Gu:  Denies bladder incontinence, burning urine Ext:   No Joint pain, stiffness. Skin: No skin rash,  hives  Endoc:  No polyuria, polydipsia. Psych: No depression, insomnia. Other:  All other systems were reviewed with the patient and were negative other that what is mentioned in the HPI.   Physical Examination:   VS: BP 140/82 (BP Location: Left Arm, Cuff Size: Normal)   Pulse 67   Resp 16   Ht 5' (1.524 m)   Wt 138 lb (62.6 kg)   SpO2 96%   BMI 26.95 kg/m   General Appearance: No distress  Neuro:without focal findings,  speech normal,  HEENT: PERRLA, EOM intact.   Pulmonary: normal breath sounds, scattered bilateral wheezing.  Decreased air entry bilaterally. CardiovascularNormal S1,S2.  No m/r/g.   Abdomen: Benign, Soft, non-tender. Renal:  No costovertebral tenderness  GU:  No performed at this time. Endoc: No evident thyromegaly, no signs of acromegaly. Skin:   warm, no rashes, no ecchymosis  Extremities: normal, no cyanosis, clubbing.  Other findings:    LABORATORY PANEL:   CBC No results for input(s): WBC, HGB, HCT, PLT in the last 168 hours. ------------------------------------------------------------------------------------------------------------------  Chemistries  No results for input(s): NA, K, CL, CO2, GLUCOSE, BUN, CREATININE, CALCIUM, MG, AST, ALT, ALKPHOS, BILITOT in the last 168 hours.  Invalid input(s): GFRCGP ------------------------------------------------------------------------------------------------------------------  Cardiac Enzymes No results for input(s): TROPONINI in the last 168 hours. ------------------------------------------------------------  RADIOLOGY:  No results found.     Thank  you for the consultation and for allowing [facility] to assist in the care of your patient. Our recommendations are noted above.  Please contact us if we  can be of further service.   Marda Stalker, MD.  Board Certified in Internal Medicine, Pulmonary Medicine, [place], and Sleep Medicine.  [facility] Office Number: [phone]  Patricia Pesa, M.D.  Merton Border, M.D  04/24/2017

## 2017-04-25 ENCOUNTER — Encounter: Payer: Self-pay | Admitting: Internal Medicine

## 2017-04-25 ENCOUNTER — Ambulatory Visit: Payer: PPO | Admitting: Internal Medicine

## 2017-04-25 VITALS — BP 140/82 | HR 67 | Resp 16 | Ht 60.0 in | Wt 138.0 lb

## 2017-04-25 DIAGNOSIS — J449 Chronic obstructive pulmonary disease, unspecified: Secondary | ICD-10-CM

## 2017-04-25 MED ORDER — FLUTICASONE FUROATE-VILANTEROL 100-25 MCG/INH IN AEPB: 1.0000 | INHALATION_SPRAY | Freq: Every day | RESPIRATORY_TRACT | 5 refills | Status: DC

## 2017-04-25 NOTE — Addendum Note (Signed)
Addended by: Oscar La R on: 04/25/2017 11:27 AM   Modules accepted: Orders

## 2017-04-25 NOTE — Patient Instructions (Addendum)
Will give sample of breo 100 today, call if it helps and we can give you a prescription.

## 2017-05-02 DIAGNOSIS — M2550 Pain in unspecified joint: Secondary | ICD-10-CM | POA: Diagnosis not present

## 2017-05-02 DIAGNOSIS — L409 Psoriasis, unspecified: Secondary | ICD-10-CM | POA: Diagnosis not present

## 2017-05-02 DIAGNOSIS — E663 Overweight: Secondary | ICD-10-CM | POA: Diagnosis not present

## 2017-05-02 DIAGNOSIS — L659 Nonscarring hair loss, unspecified: Secondary | ICD-10-CM | POA: Diagnosis not present

## 2017-05-02 DIAGNOSIS — L4050 Arthropathic psoriasis, unspecified: Secondary | ICD-10-CM | POA: Diagnosis not present

## 2017-05-02 DIAGNOSIS — Z79899 Other long term (current) drug therapy: Secondary | ICD-10-CM | POA: Diagnosis not present

## 2017-05-02 DIAGNOSIS — J449 Chronic obstructive pulmonary disease, unspecified: Secondary | ICD-10-CM | POA: Diagnosis not present

## 2017-05-02 DIAGNOSIS — M150 Primary generalized (osteo)arthritis: Secondary | ICD-10-CM | POA: Diagnosis not present

## 2017-05-02 DIAGNOSIS — Z6825 Body mass index (BMI) 25.0-25.9, adult: Secondary | ICD-10-CM | POA: Diagnosis not present

## 2017-07-26 DIAGNOSIS — H52223 Regular astigmatism, bilateral: Secondary | ICD-10-CM | POA: Diagnosis not present

## 2017-07-26 DIAGNOSIS — H2589 Other age-related cataract: Secondary | ICD-10-CM | POA: Diagnosis not present

## 2017-07-26 DIAGNOSIS — H524 Presbyopia: Secondary | ICD-10-CM | POA: Diagnosis not present

## 2017-07-26 DIAGNOSIS — H5203 Hypermetropia, bilateral: Secondary | ICD-10-CM | POA: Diagnosis not present

## 2017-08-10 DIAGNOSIS — Z79899 Other long term (current) drug therapy: Secondary | ICD-10-CM | POA: Diagnosis not present

## 2017-08-10 DIAGNOSIS — M2550 Pain in unspecified joint: Secondary | ICD-10-CM | POA: Diagnosis not present

## 2017-08-10 DIAGNOSIS — E663 Overweight: Secondary | ICD-10-CM | POA: Diagnosis not present

## 2017-08-10 DIAGNOSIS — L659 Nonscarring hair loss, unspecified: Secondary | ICD-10-CM | POA: Diagnosis not present

## 2017-08-10 DIAGNOSIS — M150 Primary generalized (osteo)arthritis: Secondary | ICD-10-CM | POA: Diagnosis not present

## 2017-08-10 DIAGNOSIS — J449 Chronic obstructive pulmonary disease, unspecified: Secondary | ICD-10-CM | POA: Diagnosis not present

## 2017-08-10 DIAGNOSIS — L409 Psoriasis, unspecified: Secondary | ICD-10-CM | POA: Diagnosis not present

## 2017-08-10 DIAGNOSIS — Z6825 Body mass index (BMI) 25.0-25.9, adult: Secondary | ICD-10-CM | POA: Diagnosis not present

## 2017-08-10 DIAGNOSIS — L4050 Arthropathic psoriasis, unspecified: Secondary | ICD-10-CM | POA: Diagnosis not present

## 2017-09-07 DIAGNOSIS — L4050 Arthropathic psoriasis, unspecified: Secondary | ICD-10-CM | POA: Diagnosis not present

## 2017-09-07 DIAGNOSIS — Z79899 Other long term (current) drug therapy: Secondary | ICD-10-CM | POA: Diagnosis not present

## 2017-09-26 DIAGNOSIS — Z1231 Encounter for screening mammogram for malignant neoplasm of breast: Secondary | ICD-10-CM | POA: Diagnosis not present

## 2017-09-27 ENCOUNTER — Encounter: Payer: Self-pay | Admitting: Family Medicine

## 2017-10-26 ENCOUNTER — Other Ambulatory Visit: Payer: Self-pay | Admitting: *Deleted

## 2017-10-26 MED ORDER — AMLODIPINE BESYLATE 10 MG PO TABS: 10.0000 mg | ORAL_TABLET | Freq: Every day | ORAL | 0 refills | Status: DC

## 2017-10-29 NOTE — Progress Notes (Signed)
[facility] Consultation      Assessment and Plan:  77 year old female with progressive dyspnea on exertion.  Chronic bronchitis/emphysema.. -Patient's imaging is consistent with emphysema. --Continue spiriva, she noted no improvement with Breo sample given her last visit, will give prescription and sample of Symbicort today to see if this helps.  She should otherwise continue Spiriva daily as well as albuterol as needed.  Inhaler technique was demonstrated. --PPSV 23 07/17/08; Prevnar 13 04/28/14. Flu vaccine given today.   Orders Placed This Encounter  Procedures  . Flu vaccine HIGH DOSE PF   Meds ordered this encounter  Medications  . budesonide-formoterol (SYMBICORT) 80-4.5 MCG/ACT inhaler    Sig: Inhale 2 puffs into the lungs 2 (two) times daily.    Dispense:  1 Inhaler    Refill:  12    Return in about 6 months (around 05/01/2018).   Date: 10/29/2017  MRN# [id number] SUBRENA DEVEREUX September 14, 1940    Pamela Morales is a 77 y.o. old female seen in consultation for chief complaint of:    Chief Complaint  Patient presents with  . COPD    pt was given a trial of Breo 100. She didn't feel any benefit and went back on Spiriva.  . Shortness of Breath    with exertion, wheezing.    HPI:  The patient is a 77 year old female with chronic bronchitis. At last visit she was having some residual dyspnea, she was asked to continue spiriva, she was also given a Breo sample and ask to call us back if it was helping.   Since her last visit she tried [place] and did not feel that it made a difference. She is back on the spiriva and feels that it is helping but does not feel that it is lasting all day long, and sometimes has trouble sometimes when she does some yard work. She has a rescue inhaler which she does exercise or other activity.  She used to smoke, up to 3 ppd, quit at the age of 3. Father smoked and died of lung cancer.  No occupational exposure.   **Alpha-1  testing negative. **PFT tracings personally reviewed>> 07/08/16; FVC is 99% predicted, FEV1 is 89% predicted ratio 70% no bronchodilator was administered. Lung volumes TLC is 111%, RV/TLC ratio is normal flow volume loop is normal.  Diffusion capacity is 83% predicted. - Overall this test shows normal pulmonary functions without evidence of COPD/emphysema. **CXR 03/23/16>> Hyperinflation c/w emphysema.   Medication:    Current Outpatient Medications:  .  albuterol (PROAIR HFA) 108 (90 Base) MCG/ACT inhaler, Inhale 2 puffs into the lungs every 6 (six) hours as needed for wheezing or shortness of breath (2 puffs before exercise or activity)., Disp: 1 Inhaler, Rfl: 6 .  amLODipine (NORVASC) 10 MG tablet, Take 1 tablet (10 mg total) by mouth daily. *Needs to schedule an appt before future refills are given*, Disp: 30 tablet, Rfl: 0 .  Ascorbic Acid (VITAMIN C PO), Take 500 mg by mouth daily. , Disp: , Rfl:  .  Calcium Carbonate-Vitamin D (CALTRATE 600+D PO), Take 1,200 mg by mouth every evening. , Disp: , Rfl:  .  Cetirizine HCl (ZYRTEC ALLERGY PO), Take 1 capsule by mouth daily., Disp: , Rfl:  .  Cholecalciferol (D3 VITAMIN PO), Take 500 mcg by mouth daily., Disp: , Rfl:  .  Cyanocobalamin (B-12 PO), Take 5,000 mg by mouth daily., Disp: , Rfl:  .  Doxylamine Succinate, Sleep, (UNISOM PO), Take 50 mg by  mouth at bedtime., Disp: , Rfl:  .  FLUoxetine (PROZAC) 20 MG capsule, TAKE 1 TO 2 CAPSULES BY MOUTH EVERY DAY, Disp: 180 capsule, Rfl: 3 .  fluticasone furoate-vilanterol (BREO ELLIPTA) 100-25 MCG/INH AEPB, Inhale 1 puff into the lungs daily., Disp: 60 each, Rfl: 5 .  folic acid (FOLVITE) 456 MCG tablet, Take by mouth. 4 tablets once daily, Disp: , Rfl:  .  guaiFENesin (TUSSIN) 100 MG/5ML liquid, Take 200 mg by mouth 3 (three) times daily as needed for cough., Disp: , Rfl:  .  MAGNESIUM PO, Take 500 mg by mouth daily as needed (only takes when working out, for muscle cramps). , Disp: , Rfl:  .   MELATONIN PO, Take 10 mg by mouth at bedtime., Disp: , Rfl:  .  methotrexate (RHEUMATREX) 2.5 MG tablet, Take 6 tablets by mouth once a week. , Disp: , Rfl:  .  metroNIDAZOLE (METROCREAM) 0.75 % cream, Apply topically 2 (two) times daily., Disp: 60 g, Rfl: 3 .  Multiple Vitamin (MULTIVITAMIN) tablet, Take 1 tablet by mouth daily.  , Disp: , Rfl:  .  tiotropium (SPIRIVA) 18 MCG inhalation capsule, Place 1 capsule (18 mcg total) into inhaler and inhale daily., Disp: 30 capsule, Rfl: 12 .  TURMERIC CURCUMIN PO, Take 500 mg by mouth daily., Disp: , Rfl:    Allergies:  Sulfa antibiotics   Review of Systems:  Constitutional: Feels well. Cardiovascular: Denies chest pain, exertional chest pain.  Pulmonary: Denies hemoptysis, pleuritic chest pain.   The remainder of systems were reviewed and were found to be negative other than what is documented in the HPI.    Physical Examination:   VS: BP 112/60 (BP Location: Left Arm, Cuff Size: Normal)   Pulse 60   Resp 16   Ht 5' (1.524 m)   Wt 138 lb (62.6 kg)   SpO2 98%   BMI 26.95 kg/m   General Appearance: No distress  Neuro:without focal findings, mental status, speech normal, alert and oriented HEENT: PERRLA, EOM intact Pulmonary: No wheezing, No rales  CardiovascularNormal S1,S2.  No m/r/g.  Abdomen: Benign, Soft, non-tender, No masses Renal:  No costovertebral tenderness  GU:  No performed at this time. Endoc: No evident thyromegaly, no signs of acromegaly or Cushing features Skin:   warm, no rashes, no ecchymosis  Extremities: normal, no cyanosis, clubbing.       LABORATORY PANEL:   CBC No results for input(s): WBC, HGB, HCT, PLT in the last 168 hours. ------------------------------------------------------------------------------------------------------------------  Chemistries  No results for input(s): NA, K, CL, CO2, GLUCOSE, BUN, CREATININE, CALCIUM, MG, AST, ALT, ALKPHOS, BILITOT in the last 168 hours.  Invalid  input(s): GFRCGP ------------------------------------------------------------------------------------------------------------------  Cardiac Enzymes No results for input(s): TROPONINI in the last 168 hours. ------------------------------------------------------------  RADIOLOGY:  No results found.     Thank  you for the consultation and for allowing [facility] to assist in the care of your patient. Our recommendations are noted above.  Please contact us if we can be of further service.  Marda Stalker, M.D., F.C.C.P.  Board Certified in Internal Medicine, Pulmonary Medicine, [place], and Sleep Medicine.  [facility] Office Number: [phone]   10/29/2017

## 2017-10-30 ENCOUNTER — Ambulatory Visit (INDEPENDENT_AMBULATORY_CARE_PROVIDER_SITE_OTHER): Payer: PPO | Admitting: Internal Medicine

## 2017-10-30 ENCOUNTER — Encounter: Payer: Self-pay | Admitting: Internal Medicine

## 2017-10-30 VITALS — BP 112/60 | HR 60 | Resp 16 | Ht 60.0 in | Wt 138.0 lb

## 2017-10-30 DIAGNOSIS — J449 Chronic obstructive pulmonary disease, unspecified: Secondary | ICD-10-CM

## 2017-10-30 DIAGNOSIS — Z23 Encounter for immunization: Secondary | ICD-10-CM

## 2017-10-30 MED ORDER — BUDESONIDE-FORMOTEROL FUMARATE 80-4.5 MCG/ACT IN AERO: 2.0000 | INHALATION_SPRAY | Freq: Two times a day (BID) | RESPIRATORY_TRACT | 12 refills | Status: DC

## 2017-10-30 MED ORDER — BUDESONIDE-FORMOTEROL FUMARATE 80-4.5 MCG/ACT IN AERO: 2.0000 | INHALATION_SPRAY | Freq: Two times a day (BID) | RESPIRATORY_TRACT | 0 refills | Status: DC

## 2017-10-30 NOTE — Patient Instructions (Signed)
Will start symbicort 2 puffs twice daily in addition to spiriva.

## 2017-10-30 NOTE — Addendum Note (Signed)
Addended by: [name] Coup on: 10/30/2017 11:51 AM   Modules accepted: Orders

## 2017-11-07 DIAGNOSIS — E663 Overweight: Secondary | ICD-10-CM | POA: Diagnosis not present

## 2017-11-07 DIAGNOSIS — L409 Psoriasis, unspecified: Secondary | ICD-10-CM | POA: Diagnosis not present

## 2017-11-07 DIAGNOSIS — M2550 Pain in unspecified joint: Secondary | ICD-10-CM | POA: Diagnosis not present

## 2017-11-07 DIAGNOSIS — L4050 Arthropathic psoriasis, unspecified: Secondary | ICD-10-CM | POA: Diagnosis not present

## 2017-11-07 DIAGNOSIS — Z79899 Other long term (current) drug therapy: Secondary | ICD-10-CM | POA: Diagnosis not present

## 2017-11-07 DIAGNOSIS — M150 Primary generalized (osteo)arthritis: Secondary | ICD-10-CM | POA: Diagnosis not present

## 2017-11-07 DIAGNOSIS — J449 Chronic obstructive pulmonary disease, unspecified: Secondary | ICD-10-CM | POA: Diagnosis not present

## 2017-11-07 DIAGNOSIS — Z6825 Body mass index (BMI) 25.0-25.9, adult: Secondary | ICD-10-CM | POA: Diagnosis not present

## 2017-11-07 DIAGNOSIS — L659 Nonscarring hair loss, unspecified: Secondary | ICD-10-CM | POA: Diagnosis not present

## 2017-12-04 ENCOUNTER — Ambulatory Visit (INDEPENDENT_AMBULATORY_CARE_PROVIDER_SITE_OTHER): Payer: PPO | Admitting: Family Medicine

## 2017-12-04 ENCOUNTER — Encounter: Payer: Self-pay | Admitting: Family Medicine

## 2017-12-04 VITALS — BP 130/80 | HR 66 | Temp 98.0°F | Ht 60.0 in | Wt 135.0 lb

## 2017-12-04 DIAGNOSIS — L4050 Arthropathic psoriasis, unspecified: Secondary | ICD-10-CM | POA: Diagnosis not present

## 2017-12-04 DIAGNOSIS — I10 Essential (primary) hypertension: Secondary | ICD-10-CM

## 2017-12-04 DIAGNOSIS — R7989 Other specified abnormal findings of blood chemistry: Secondary | ICD-10-CM | POA: Diagnosis not present

## 2017-12-04 DIAGNOSIS — R062 Wheezing: Secondary | ICD-10-CM

## 2017-12-04 MED ORDER — AMLODIPINE BESYLATE 10 MG PO TABS: 10.0000 mg | ORAL_TABLET | Freq: Every day | ORAL | 3 refills | Status: DC

## 2017-12-04 MED ORDER — FLUOXETINE HCL 20 MG PO CAPS: ORAL_CAPSULE | ORAL | 3 refills | Status: DC

## 2017-12-04 NOTE — Assessment & Plan Note (Signed)
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

## 2017-12-04 NOTE — Patient Instructions (Addendum)
Talk to your rheumatologist about changing [place] Nail (due ot nausea)  Keep exercising  Take care of yourself   If you are interested in the new shingles vaccine (Shingrix) - call your local pharmacy to check on coverage and availability  If affordable, get on a wait list at your pharmacy to get the vaccine.    we will send for last labs from rheumatology

## 2017-12-04 NOTE — Assessment & Plan Note (Signed)
Doing well /seeing pulmonary - improved on symbicort Able to exercise w/o problems

## 2017-12-04 NOTE — Assessment & Plan Note (Signed)
bp in fair control at this time  BP Readings from Last 1 Encounters:  12/04/17 130/80   No changes needed Most recent labs reviewed  Disc lifstyle change with low sodium diet and exercise  Pt had recent labs from rheumatology- will send for those

## 2017-12-04 NOTE — Progress Notes (Signed)
Subjective:    Patient ID: Pamela Morales, female    DOB: [birth year], 77 y.o.   MRN: [id number]  HPI Here to discuss chronic medical problems and GI complaints   Wt Readings from Last 3 Encounters:  12/04/17 135 lb (61.2 kg)  10/30/17 138 lb (62.6 kg)  04/25/17 138 lb (62.6 kg)  taking care of herself  26.37 kg/m   Some nausea  Off MTX- she had flare of psoriatic arthritis  Now on otezla - causing nausea (a little diarrhea) No 6 wk trial  Also prednisone 5 mg -to calm down her knee  Very active-working out   Some nosebleeds    bp is up a bit- just came from gym working out  No cp or palpitations or headaches or edema  No side effects to medicines  BP Readings from Last 3 Encounters:  12/04/17 (!) 142/62  10/30/17 112/60  04/25/17 140/82     Lab Results  Component Value Date   CREATININE 1.58 (H) 10/12/2016   BUN 29 (H) 10/12/2016   NA 138 10/12/2016   K 4.9 10/12/2016   CL 102 10/12/2016   CO2 29 10/12/2016   Goal was to re check in a month  Was prev on methotrexate (it was better off the methotrexate)   Sees pulmonary- Dr Juanell Fairly, doing well with breathing   Mood is ok Dewaine Conger prozac  She is watching memory- problems with names   Patient Active Problem List   Diagnosis Date Noted  . Elevated TSH 10/18/2016  . Elevated serum creatinine 10/18/2016  . Dyspnea on exertion 03/23/2016  . Estrogen deficiency 03/23/2016  . Encounter for screening mammogram for breast cancer 03/23/2016  . Psoriatic arthritis ([place]) 03/31/2015  . Routine general medical examination at a health care facility 03/25/2015  . Heartburn 03/03/2015  . Wheezing 01/09/2015  . Fatigue 04/28/2014  . Family history of colon cancer 01/01/2013  . Personal history of colonic polyps 01/01/2013  . Colon cancer screening 12/19/2012  . Swelling of joint of right wrist 10/23/2012  . Hearing loss 09/19/2012  . 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 Sleep disorder 01/07/2011  . Colon  polyps 07/16/2010  . BACK PAIN WITH RADICULOPATHY 03/15/2007  . History of herpes zoster 02/23/2007  . Essential hypertension 02/05/2007  . Osteopenia 02/05/2007  . ECZEMA, ATOPIC 01/29/2007  . ROSACEA 01/29/2007  . BASAL CELL CARCINOMA, HX OF 01/29/2007   Past Medical History:  Diagnosis Date  . Adenomatous polyp of colon   . CA - cancer of ovary   . Cancer ([place]) 02/10   R breast  (est rec neg and brac neg)  . DDD (degenerative disc disease)    in back with chronic pain  . Depression   . Eczema   . Hypertension   . Osteopenia   . Rosacea   . Zoster 02/09   Past Surgical History:  Procedure Laterality Date  . ABDOMINAL HYSTERECTOMY  1996   BSO fibroids, incidental ovarian CA finding  . BASAL CELL CARCINOMA EXCISION  09/2016  . bladder tack    . BREAST SURGERY     rt total mastectomy for breast CA  . CHOLECYSTECTOMY  08/1996   10 yrs ago  . COLONOSCOPY    . lipoma removal  11/07   Social History   Tobacco Use  . Smoking status: Former Smoker    Last attempt to quit: 01/03/1990    Years since quitting: 27.9  . Smokeless tobacco: Never Used  Substance Use Topics  . Alcohol use: Yes    Alcohol/week: 7.0 standard drinks    Types: 2 Standard drinks or equivalent, 5 Glasses of wine per week  . Drug use: No   Family History  Problem Relation Age of Onset  . Cancer Mother        stomach  . Hypertension Mother   . Heart disease Mother        CAD  . Stomach cancer Mother   . Cancer Father        lung  . Cancer Sister        colon, skin  . Heart disease Sister 5       MI  . Diabetes Sister   . Colon cancer Sister   . Heart disease Brother        HTN and MI  . Hypertension Brother   . Esophageal cancer Neg Hx    Allergies  Allergen Reactions  . Sulfa Antibiotics    Current Outpatient Medications on File Prior to Visit  Medication Sig Dispense Refill  . Apremilast (OTEZLA PO) Take 30 mg by mouth daily.    . Ascorbic Acid (VITAMIN C PO) Take 500 mg by  mouth daily.     . Calcium Carbonate-Vitamin D (CALTRATE 600+D PO) Take 1,200 mg by mouth every evening.     . Cyanocobalamin (B-12 PO) Take 5,000 mg by mouth daily.    . Doxylamine Succinate, Sleep, (UNISOM PO) Take 50 mg by mouth at bedtime.    . folic acid (FOLVITE) 269 MCG tablet Take by mouth. 4 tablets once daily    . MAGNESIUM PO Take 500 mg by mouth daily as needed (only takes when working out, for muscle cramps).     . MELATONIN PO Take 10 mg by mouth at bedtime.    . Multiple Vitamin (MULTIVITAMIN) tablet Take 1 tablet by mouth daily.      Marland Kitchen tiotropium (SPIRIVA) 18 MCG inhalation capsule Place 1 capsule (18 mcg total) into inhaler and inhale daily. 30 capsule 12  . TURMERIC CURCUMIN PO Take 500 mg by mouth daily.    . budesonide-formoterol (SYMBICORT) 80-4.5 MCG/ACT inhaler Inhale 2 puffs into the lungs 2 (two) times daily. 1 Inhaler 0   No current facility-administered medications on file prior to visit.     Review of Systems  Constitutional: Negative for activity change, appetite change, fatigue, fever and unexpected weight change.  HENT: Negative for congestion, ear pain, rhinorrhea, sinus pressure and sore throat.   Eyes: Negative for pain, redness and visual disturbance.  Respiratory: Negative for cough, shortness of breath and wheezing.   Cardiovascular: Negative for chest pain and palpitations.  Gastrointestinal: Negative for abdominal pain, blood in stool, constipation and diarrhea.  Endocrine: Negative for polydipsia and polyuria.  Genitourinary: Negative for dysuria, frequency and urgency.  Musculoskeletal: Positive for arthralgias. Negative for back pain and myalgias.       L knee was painful-that is improved    Skin: Negative for pallor and rash.  Allergic/Immunologic: Negative for environmental allergies.  Neurological: Negative for dizziness, syncope and headaches.  Hematological: Negative for adenopathy. Does not bruise/bleed easily.  Psychiatric/Behavioral:  Negative for decreased concentration and dysphoric mood. The patient is not nervous/anxious.        Objective:   Physical Exam  Constitutional: She appears well-developed and well-nourished. No distress.  Well appearing   HENT:  Head: Normocephalic and atraumatic.  Mouth/Throat: Oropharynx is clear and moist.  Eyes: Pupils are equal, round,  and reactive to light. Conjunctivae and EOM are normal.  Neck: Normal range of motion. Neck supple. No JVD present. Carotid bruit is not present. No thyromegaly present.  Cardiovascular: Normal rate, regular rhythm, normal heart sounds and intact distal pulses. Exam reveals no gallop.  Pulmonary/Chest: Effort normal and breath sounds normal. No stridor. No respiratory distress. She has no wheezes. She has no rales.  No crackles  Abdominal: Soft. Bowel sounds are normal. She exhibits no distension, no abdominal bruit and no mass. There is no tenderness.  Musculoskeletal: She exhibits no edema.  Lymphadenopathy:    She has no cervical adenopathy.  Neurological: She is alert. She has normal reflexes. She displays normal reflexes. No cranial nerve deficit. Coordination normal.  Skin: Skin is warm and dry. No rash noted.  Psychiatric: She has a normal mood and affect.  Pleasant and cheerful           Assessment & Plan:   Problem List Items Addressed This Visit      Cardiovascular and Mediastinum   Essential hypertension - Primary    bp in fair control at this time  BP Readings from Last 1 Encounters:  12/04/17 130/80   No changes needed Most recent labs reviewed  Disc lifstyle change with low sodium diet and exercise  Pt had recent labs from rheumatology- will send for those       Relevant Medications   amLODipine (NORVASC) 10 MG tablet     Musculoskeletal and Integument   Psoriatic arthritis ([place])    Off MTX due to renal effects  Sent for last labs from rheumatology  Now trying otezla (causing nausea)- she may need to change to  another agent        Relevant Medications   Apremilast (OTEZLA PO)     Other   Wheezing    Doing well /seeing pulmonary - improved on symbicort Able to exercise w/o problems        Elevated serum creatinine    When on MTX  Per pt -improved after stopping it  Sent for labs from rheumatology to confirm  Good water intake and health habits

## 2017-12-04 NOTE — Assessment & Plan Note (Signed)
When on MTX  Per pt -improved after stopping it  Sent for labs from rheumatology to confirm  Good water intake and health habits

## 2017-12-19 DIAGNOSIS — Z6824 Body mass index (BMI) 24.0-24.9, adult: Secondary | ICD-10-CM | POA: Diagnosis not present

## 2017-12-19 DIAGNOSIS — L4050 Arthropathic psoriasis, unspecified: Secondary | ICD-10-CM | POA: Diagnosis not present

## 2017-12-19 DIAGNOSIS — J449 Chronic obstructive pulmonary disease, unspecified: Secondary | ICD-10-CM | POA: Diagnosis not present

## 2017-12-19 DIAGNOSIS — L659 Nonscarring hair loss, unspecified: Secondary | ICD-10-CM | POA: Diagnosis not present

## 2017-12-19 DIAGNOSIS — M150 Primary generalized (osteo)arthritis: Secondary | ICD-10-CM | POA: Diagnosis not present

## 2017-12-19 DIAGNOSIS — M2550 Pain in unspecified joint: Secondary | ICD-10-CM | POA: Diagnosis not present

## 2017-12-19 DIAGNOSIS — L409 Psoriasis, unspecified: Secondary | ICD-10-CM | POA: Diagnosis not present

## 2017-12-19 DIAGNOSIS — Z79899 Other long term (current) drug therapy: Secondary | ICD-10-CM | POA: Diagnosis not present

## 2018-01-25 DIAGNOSIS — Z808 Family history of malignant neoplasm of other organs or systems: Secondary | ICD-10-CM | POA: Diagnosis not present

## 2018-01-25 DIAGNOSIS — D225 Melanocytic nevi of trunk: Secondary | ICD-10-CM | POA: Diagnosis not present

## 2018-01-25 DIAGNOSIS — D2262 Melanocytic nevi of left upper limb, including shoulder: Secondary | ICD-10-CM | POA: Diagnosis not present

## 2018-01-25 DIAGNOSIS — D2261 Melanocytic nevi of right upper limb, including shoulder: Secondary | ICD-10-CM | POA: Diagnosis not present

## 2018-01-25 DIAGNOSIS — L570 Actinic keratosis: Secondary | ICD-10-CM | POA: Diagnosis not present

## 2018-01-25 DIAGNOSIS — Z85828 Personal history of other malignant neoplasm of skin: Secondary | ICD-10-CM | POA: Diagnosis not present

## 2018-01-25 DIAGNOSIS — Z23 Encounter for immunization: Secondary | ICD-10-CM | POA: Diagnosis not present

## 2018-01-25 DIAGNOSIS — Z86018 Personal history of other benign neoplasm: Secondary | ICD-10-CM | POA: Diagnosis not present

## 2018-01-25 DIAGNOSIS — D2272 Melanocytic nevi of left lower limb, including hip: Secondary | ICD-10-CM | POA: Diagnosis not present

## 2018-02-08 ENCOUNTER — Encounter: Payer: Self-pay | Admitting: Gastroenterology

## 2018-02-20 DIAGNOSIS — L570 Actinic keratosis: Secondary | ICD-10-CM | POA: Diagnosis not present

## 2018-03-20 DIAGNOSIS — M150 Primary generalized (osteo)arthritis: Secondary | ICD-10-CM | POA: Diagnosis not present

## 2018-03-20 DIAGNOSIS — R5383 Other fatigue: Secondary | ICD-10-CM | POA: Diagnosis not present

## 2018-03-20 DIAGNOSIS — L4050 Arthropathic psoriasis, unspecified: Secondary | ICD-10-CM | POA: Diagnosis not present

## 2018-03-20 DIAGNOSIS — Z6825 Body mass index (BMI) 25.0-25.9, adult: Secondary | ICD-10-CM | POA: Diagnosis not present

## 2018-03-20 DIAGNOSIS — E663 Overweight: Secondary | ICD-10-CM | POA: Diagnosis not present

## 2018-03-20 DIAGNOSIS — J449 Chronic obstructive pulmonary disease, unspecified: Secondary | ICD-10-CM | POA: Diagnosis not present

## 2018-03-20 DIAGNOSIS — M2550 Pain in unspecified joint: Secondary | ICD-10-CM | POA: Diagnosis not present

## 2018-03-20 DIAGNOSIS — L409 Psoriasis, unspecified: Secondary | ICD-10-CM | POA: Diagnosis not present

## 2018-03-20 DIAGNOSIS — L659 Nonscarring hair loss, unspecified: Secondary | ICD-10-CM | POA: Diagnosis not present

## 2018-03-20 DIAGNOSIS — Z79899 Other long term (current) drug therapy: Secondary | ICD-10-CM | POA: Diagnosis not present

## 2018-06-19 DIAGNOSIS — J449 Chronic obstructive pulmonary disease, unspecified: Secondary | ICD-10-CM | POA: Diagnosis not present

## 2018-06-19 DIAGNOSIS — L659 Nonscarring hair loss, unspecified: Secondary | ICD-10-CM | POA: Diagnosis not present

## 2018-06-19 DIAGNOSIS — L4050 Arthropathic psoriasis, unspecified: Secondary | ICD-10-CM | POA: Diagnosis not present

## 2018-06-19 DIAGNOSIS — Z6824 Body mass index (BMI) 24.0-24.9, adult: Secondary | ICD-10-CM | POA: Diagnosis not present

## 2018-06-19 DIAGNOSIS — R5383 Other fatigue: Secondary | ICD-10-CM | POA: Diagnosis not present

## 2018-06-19 DIAGNOSIS — M2550 Pain in unspecified joint: Secondary | ICD-10-CM | POA: Diagnosis not present

## 2018-06-19 DIAGNOSIS — L409 Psoriasis, unspecified: Secondary | ICD-10-CM | POA: Diagnosis not present

## 2018-06-19 DIAGNOSIS — E663 Overweight: Secondary | ICD-10-CM | POA: Diagnosis not present

## 2018-06-19 DIAGNOSIS — Z79899 Other long term (current) drug therapy: Secondary | ICD-10-CM | POA: Diagnosis not present

## 2018-06-19 DIAGNOSIS — M150 Primary generalized (osteo)arthritis: Secondary | ICD-10-CM | POA: Diagnosis not present

## 2018-07-12 ENCOUNTER — Ambulatory Visit (INDEPENDENT_AMBULATORY_CARE_PROVIDER_SITE_OTHER): Payer: PPO | Admitting: Family Medicine

## 2018-07-12 ENCOUNTER — Encounter: Payer: Self-pay | Admitting: Family Medicine

## 2018-07-12 ENCOUNTER — Other Ambulatory Visit: Payer: Self-pay

## 2018-07-12 VITALS — BP 148/78 | HR 61 | Temp 97.7°F | Ht 60.0 in | Wt 136.2 lb

## 2018-07-12 DIAGNOSIS — J449 Chronic obstructive pulmonary disease, unspecified: Secondary | ICD-10-CM

## 2018-07-12 DIAGNOSIS — I10 Essential (primary) hypertension: Secondary | ICD-10-CM

## 2018-07-12 DIAGNOSIS — R7989 Other specified abnormal findings of blood chemistry: Secondary | ICD-10-CM | POA: Diagnosis not present

## 2018-07-12 DIAGNOSIS — L4050 Arthropathic psoriasis, unspecified: Secondary | ICD-10-CM | POA: Diagnosis not present

## 2018-07-12 MED ORDER — LOSARTAN POTASSIUM 50 MG PO TABS: 50.0000 mg | ORAL_TABLET | Freq: Every day | ORAL | 11 refills | Status: DC

## 2018-07-12 NOTE — Patient Instructions (Addendum)
Continue amlodipine  Start losartan 50 mg once daily  If any side effects or problems please let me know   Continue to eat a healthy diet and exercise  Avoid excessive sodium  Keep up the great work!  Schedule labs in 2 weeks  Check bp at home and we will check in regarding your readings

## 2018-07-12 NOTE — Assessment & Plan Note (Signed)
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

## 2018-07-12 NOTE — Assessment & Plan Note (Signed)
On spiriva  Managed by pulmonary  No recent breathing issues

## 2018-07-12 NOTE — Assessment & Plan Note (Signed)
Pt plans to go into a study soon if bp is in good enough control

## 2018-07-12 NOTE — Assessment & Plan Note (Signed)
Aware of this in setting of rheum medications and HTN Adding losartan today-will need to watch renal panel carefully

## 2018-07-12 NOTE — Progress Notes (Signed)
Subjective:    Patient ID: Pamela Morales, female    DOB: 1940-09-04, 78 y.o.   MRN: [id number]  HPI Here for HTN f/u  bp has been elevated lately  Wt Readings from Last 3 Encounters:  07/12/18 136 lb 4 oz (61.8 kg)  12/04/17 135 lb (61.2 kg)  10/30/17 138 lb (62.6 kg)  stable weight  26.61 kg/m   She has been eating cantelope with salt on it  She then bought "no salt)  Eats very healthy   Last cr 1.3 with rheumatology   Quite fit  15,000 steps per day  She does some intermittent running   Blood pressure  BP Readings from Last 3 Encounters:  07/12/18 (!) 148/78  12/04/17 130/80  10/30/17 112/60   Her wrist cuff correlates with this  At home her bp is usually 140s-150s /80s   Pulse Readings from Last 3 Encounters:  07/12/18 61  12/04/17 66  10/30/17 60   BP: (!) 148/78 on re check after sitting   Takes amlodipine 10 mg   Psoriatic arthritis Now on otezla  Fair control   Supposed to go into a trial of bp is high   Former smoker with copd  On spiriva  Sees pulmonary  No recent sob  Patient Active Problem List   Diagnosis Date Noted  . 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 Elevated TSH 10/18/2016  . Elevated serum creatinine 10/18/2016  . Estrogen deficiency 03/23/2016  . Encounter for screening mammogram for breast cancer 03/23/2016  . Psoriatic arthritis ([place]) 03/31/2015  . Routine general medical examination at a health care facility 03/25/2015  . Heartburn 03/03/2015  . Wheezing 01/09/2015  . Fatigue 04/28/2014  . Family history of colon cancer 01/01/2013  . Personal history of colonic polyps 01/01/2013  . Colon cancer screening 12/19/2012  . Swelling of joint of right wrist 10/23/2012  . Hearing loss 09/19/2012  . 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 Sleep disorder 01/07/2011  . Colon polyps 07/16/2010  . BACK PAIN WITH RADICULOPATHY 03/15/2007  . History of herpes zoster 02/23/2007  . Essential  hypertension 02/05/2007  . Osteopenia 02/05/2007  . ECZEMA, ATOPIC 01/29/2007  . ROSACEA 01/29/2007  . BASAL CELL CARCINOMA, HX OF 01/29/2007   Past Medical History:  Diagnosis Date  . Adenomatous polyp of colon   . CA - cancer of ovary   . Cancer ([place]) 02/10   R breast  (est rec neg and brac neg)  . DDD (degenerative disc disease)    in back with chronic pain  . Depression   . Eczema   . Hypertension   . Osteopenia   . Rosacea   . Zoster 02/09   Past Surgical History:  Procedure Laterality Date  . ABDOMINAL HYSTERECTOMY  1996   BSO fibroids, incidental ovarian CA finding  . BASAL CELL CARCINOMA EXCISION  09/2016  . bladder tack    . BREAST SURGERY     rt total mastectomy for breast CA  . CHOLECYSTECTOMY  08/1996   10 yrs ago  . COLONOSCOPY    . lipoma removal  11/07   Social History   Tobacco Use  . Smoking status: Former Smoker    Quit date: 01/03/1990    Years since quitting: 28.5  . Smokeless tobacco: Never Used  Substance Use Topics  . Alcohol use: Yes    Alcohol/week: 7.0 standard drinks    Types: 2 Standard drinks or equivalent, 5  Glasses of wine per week  . Drug use: No   Family History  Problem Relation Age of Onset  . Cancer Mother        stomach  . Hypertension Mother   . Heart disease Mother        CAD  . Stomach cancer Mother   . Cancer Father        lung  . Cancer Sister        colon, skin  . Heart disease Sister 50       MI  . Diabetes Sister   . Colon cancer Sister   . Heart disease Brother        HTN and MI  . Hypertension Brother   . Esophageal cancer Neg Hx    Allergies  Allergen Reactions  . Sulfa Antibiotics    Current Outpatient Medications on File Prior to Visit  Medication Sig Dispense Refill  . amLODipine (NORVASC) 10 MG tablet Take 1 tablet (10 mg total) by mouth daily. *Needs to schedule an appt before future refills are given* 90 tablet 3  . Apremilast (OTEZLA PO) Take 30 mg by mouth 2 (two) times a day.     .  Ascorbic Acid (VITAMIN C PO) Take 500 mg by mouth daily.     . budesonide-formoterol (SYMBICORT) 80-4.5 MCG/ACT inhaler Inhale 2 puffs into the lungs 2 (two) times daily. 1 Inhaler 0  . Calcium Carbonate-Vitamin D (CALTRATE 600+D PO) Take 1,200 mg by mouth every evening.     . Cyanocobalamin (B-12 PO) Take 5,000 mg by mouth once a week.     . Doxylamine Succinate, Sleep, (UNISOM PO) Take 50 mg by mouth at bedtime.    Marland Kitchen FLUoxetine (PROZAC) 20 MG capsule TAKE 1 TO 2 CAPSULES BY MOUTH EVERY DAY 035 capsule 3  . folic acid (FOLVITE) 009 MCG tablet Take by mouth. 4 tablets once daily    . MAGNESIUM PO Take 500 mg by mouth daily as needed (only takes when working out, for muscle cramps).     . MELATONIN PO Take 10 mg by mouth at bedtime.    . Multiple Vitamin (MULTIVITAMIN) tablet Take 1 tablet by mouth daily.      Marland Kitchen tiotropium (SPIRIVA) 18 MCG inhalation capsule Place 1 capsule (18 mcg total) into inhaler and inhale daily. 30 capsule 12  . TURMERIC CURCUMIN PO Take 500 mg by mouth daily.     No current facility-administered medications on file prior to visit.      Review of Systems  Constitutional: Negative for activity change, appetite change, fatigue, fever and unexpected weight change.  HENT: Negative for congestion, ear pain, rhinorrhea, sinus pressure and sore throat.   Eyes: Negative for pain, redness and visual disturbance.  Respiratory: Negative for cough, shortness of breath and wheezing.   Cardiovascular: Negative for chest pain and palpitations.  Gastrointestinal: Negative for abdominal pain, blood in stool, constipation and diarrhea.  Endocrine: Negative for polydipsia and polyuria.  Genitourinary: Negative for dysuria, frequency and urgency.  Musculoskeletal: Positive for arthralgias. Negative for back pain and myalgias.  Skin: Negative for pallor and rash.       psoriasis  Allergic/Immunologic: Negative for environmental allergies.  Neurological: Negative for dizziness, syncope  and headaches.  Hematological: Negative for adenopathy. Does not bruise/bleed easily.  Psychiatric/Behavioral: Negative for decreased concentration and dysphoric mood. The patient is not nervous/anxious.        Objective:   Physical Exam Constitutional:      General: She  is not in acute distress.    Appearance: Normal appearance. She is well-developed and normal weight. She is not ill-appearing or diaphoretic.  HENT:     Head: Normocephalic and atraumatic.  Eyes:     General: No scleral icterus.       Right eye: No discharge.        Left eye: No discharge.     Conjunctiva/sclera: Conjunctivae normal.     Pupils: Pupils are equal, round, and reactive to light.  Neck:     Musculoskeletal: Normal range of motion and neck supple.     Thyroid: No thyromegaly.     Vascular: No carotid bruit or JVD.  Cardiovascular:     Rate and Rhythm: Normal rate and regular rhythm.     Heart sounds: Normal heart sounds. No gallop.   Pulmonary:     Effort: Pulmonary effort is normal. No respiratory distress.     Breath sounds: Normal breath sounds. No wheezing or rales.  Abdominal:     General: Bowel sounds are normal. There is no distension or abdominal bruit.     Palpations: Abdomen is soft. There is no mass.     Tenderness: There is no abdominal tenderness.  Musculoskeletal:     Right lower leg: No edema.     Left lower leg: No edema.  Lymphadenopathy:     Cervical: No cervical adenopathy.  Skin:    General: Skin is warm and dry.     Findings: No rash.  Neurological:     Mental Status: She is alert. Mental status is at baseline.     Cranial Nerves: No cranial nerve deficit.     Coordination: Coordination normal.     Gait: Gait normal.     Deep Tendon Reflexes: Reflexes are normal and symmetric. Reflexes normal.  Psychiatric:        Mood and Affect: Mood normal.           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)  inst to call if side eff or problems  Would consider beta blocker if pulse rate was not low  Plan lab in 2 weeks  Continue home bp check (her cuff is accurate) and check in with [place] at labs  Enc water intake and less sodium  Continue exercise        Relevant Medications   losartan (COZAAR) 50 MG tablet   Other Relevant Orders   Comprehensive metabolic panel     Respiratory   COPD with chronic bronchitis and emphysema ([facility])    On spiriva  Managed by pulmonary  No recent breathing issues         Musculoskeletal and Integument   Psoriatic arthritis ([facility])    Pt plans to go into a study soon if bp is in good enough control        Other   Elevated serum creatinine    Aware of this in setting of rheum medications and HTN Adding losartan today-will need to watch renal panel carefully

## 2018-07-24 ENCOUNTER — Telehealth: Payer: Self-pay

## 2018-07-24 NOTE — Telephone Encounter (Signed)
Left detailed VM w COVID screen and back door lab info   

## 2018-07-26 ENCOUNTER — Other Ambulatory Visit (INDEPENDENT_AMBULATORY_CARE_PROVIDER_SITE_OTHER): Payer: PPO

## 2018-07-26 DIAGNOSIS — I10 Essential (primary) hypertension: Secondary | ICD-10-CM

## 2018-07-26 LAB — COMPREHENSIVE METABOLIC PANEL
ALT: 16 U/L (ref 0–35)
AST: 24 U/L (ref 0–37)
Albumin: 4.1 g/dL (ref 3.5–5.2)
Alkaline Phosphatase: 65 U/L (ref 39–117)
BUN: 32 mg/dL — ABNORMAL HIGH (ref 6–23)
CO2: 26 mEq/L (ref 19–32)
Calcium: 9.8 mg/dL (ref 8.4–10.5)
Chloride: 104 mEq/L (ref 96–112)
Creatinine, Ser: 1.38 mg/dL — ABNORMAL HIGH (ref 0.40–1.20)
GFR: 36.86 mL/min — ABNORMAL LOW (ref >60.00)
Glucose, Bld: 105 mg/dL — ABNORMAL HIGH (ref 70–99)
Potassium: 4.2 mEq/L (ref 3.5–5.1)
Sodium: 139 mEq/L (ref 135–145)
Total Bilirubin: 0.3 mg/dL (ref 0.2–1.2)
Total Protein: 7.1 g/dL (ref 6.0–8.3)

## 2018-07-27 ENCOUNTER — Telehealth: Payer: Self-pay | Admitting: *Deleted

## 2018-07-27 NOTE — Telephone Encounter (Signed)
Left VM requesting pt to call the office back regarding lab results  

## 2018-08-02 ENCOUNTER — Other Ambulatory Visit: Payer: Self-pay

## 2018-08-07 ENCOUNTER — Encounter: Payer: Self-pay | Admitting: Gastroenterology

## 2018-08-21 ENCOUNTER — Telehealth: Payer: Self-pay | Admitting: Family Medicine

## 2018-08-21 NOTE — Telephone Encounter (Signed)
Patient is requesting a letter stating that it is necessary  and okay for the patient to go into the gym to exercise.. She stated the [facility] will be opening back up soon and they request a letter that it is okay by their doctor for them to be in the gym   C/B # [phone]

## 2018-08-21 NOTE — Telephone Encounter (Signed)
I do not think indoor exercise with other people is safe at this time.  I am discouraging it.  You can let her know I have diagnosed several covid cases in gym goers.

## 2018-08-22 NOTE — Telephone Encounter (Signed)
Pt notified of Dr. Tower's comments  

## 2018-09-07 ENCOUNTER — Other Ambulatory Visit: Payer: Self-pay

## 2018-09-07 ENCOUNTER — Ambulatory Visit (AMBULATORY_SURGERY_CENTER): Payer: Self-pay | Admitting: *Deleted

## 2018-09-07 ENCOUNTER — Encounter: Payer: Self-pay | Admitting: Gastroenterology

## 2018-09-07 VITALS — Temp 96.9°F | Ht 60.0 in | Wt 135.0 lb

## 2018-09-07 DIAGNOSIS — Z86010 Personal history of colonic polyps: Secondary | ICD-10-CM

## 2018-09-07 DIAGNOSIS — Z800 Family history of malignant neoplasm of digestive organs: Secondary | ICD-10-CM

## 2018-09-07 NOTE — Progress Notes (Signed)
No egg or soy allergy known to patient  No issues with past sedation with any surgeries  or procedures, no intubation problems - NA pentathol messed up her memory post op  No diet pills per patient No home 02 use per patient  No blood thinners per patient  Pt denies issues with constipation  No A fib or A flutter  EMMI video declined  Pt is having fecal urgency at times and this is new

## 2018-09-20 ENCOUNTER — Telehealth: Payer: Self-pay

## 2018-09-20 NOTE — Telephone Encounter (Signed)
Pt returned call and and answered NO to the Covid-19 screening questions

## 2018-09-20 NOTE — Telephone Encounter (Signed)
Covid-19 screening questions   Do you now or have you had a fever in the last 14 days?  Do you have any respiratory symptoms of shortness of breath or cough now or in the last 14 days?  Do you have any family members or close contacts with diagnosed or suspected Covid-19 in the past 14 days?  Have you been tested for Covid-19 and found to be positive?       

## 2018-09-21 ENCOUNTER — Other Ambulatory Visit: Payer: Self-pay

## 2018-09-21 ENCOUNTER — Ambulatory Visit (AMBULATORY_SURGERY_CENTER): Payer: PPO | Admitting: Gastroenterology

## 2018-09-21 ENCOUNTER — Other Ambulatory Visit: Payer: Self-pay | Admitting: Gastroenterology

## 2018-09-21 ENCOUNTER — Encounter: Payer: Self-pay | Admitting: Gastroenterology

## 2018-09-21 VITALS — BP 102/63 | HR 50 | Temp 98.1°F | Resp 16 | Ht 60.0 in | Wt 135.0 lb

## 2018-09-21 DIAGNOSIS — D125 Benign neoplasm of sigmoid colon: Secondary | ICD-10-CM

## 2018-09-21 DIAGNOSIS — Z800 Family history of malignant neoplasm of digestive organs: Secondary | ICD-10-CM | POA: Diagnosis not present

## 2018-09-21 DIAGNOSIS — Z86010 Personal history of colonic polyps: Secondary | ICD-10-CM

## 2018-09-21 DIAGNOSIS — D123 Benign neoplasm of transverse colon: Secondary | ICD-10-CM | POA: Diagnosis not present

## 2018-09-21 MED ORDER — SODIUM CHLORIDE 0.9 % IV SOLN: 500.0000 mL | Freq: Once | INTRAVENOUS | Status: DC

## 2018-09-21 NOTE — Patient Instructions (Signed)
Handouts : polyps and diverticulosis   YOU HAD AN ENDOSCOPIC PROCEDURE TODAY AT [place]:   Refer to the procedure report that was given to you for any specific questions about what was found during the examination.  If the procedure report does not answer your questions, please call your gastroenterologist to clarify.  If you requested that your care partner not be given the details of your procedure findings, then the procedure report has been included in a sealed envelope for you to review at your convenience later.  YOU SHOULD EXPECT: Some feelings of bloating in the abdomen. Passage of more gas than usual.  Walking can help get rid of the air that was put into your GI tract during the procedure and reduce the bloating. If you had a lower endoscopy (such as a colonoscopy or flexible sigmoidoscopy) you may notice spotting of blood in your stool or on the toilet paper. If you underwent a bowel prep for your procedure, you may not have a normal bowel movement for a few days.  Please Note:  You might notice some irritation and congestion in your nose or some drainage.  This is from the oxygen used during your procedure.  There is no need for concern and it should clear up in a day or so.  SYMPTOMS TO REPORT IMMEDIATELY:   Following lower endoscopy (colonoscopy or flexible sigmoidoscopy):  Excessive amounts of blood in the stool  Significant tenderness or worsening of abdominal pains  Swelling of the abdomen that is new, acute  Fever of 100F or higher   For urgent or emergent issues, a gastroenterologist can be reached at any hour by calling [phone].   DIET:  We do recommend a small meal at first, but then you may proceed to your regular diet.  Drink plenty of fluids but you should avoid alcoholic beverages for 24 hours.  ACTIVITY:  You should plan to take it easy for the rest of today and you should NOT DRIVE or use heavy machinery until tomorrow (because of the  sedation medicines used during the test).    FOLLOW UP: Our staff will call the number listed on your records 48-72 hours following your procedure to check on you and address any questions or concerns that you may have regarding the information given to you following your procedure. If we do not reach you, we will leave a message.  We will attempt to reach you two times.  During this call, we will ask if you have developed any symptoms of COVID 19. If you develop any symptoms (ie: fever, flu-like symptoms, shortness of breath, cough etc.) before then, please call [phone].  If you test positive for Covid 19 in the 2 weeks post procedure, please call and report this information to [place].    If any biopsies were taken you will be contacted by phone or by letter within the next 1-3 weeks.  Please call us at [phone] if you have not heard about the biopsies in 3 weeks.    SIGNATURES/CONFIDENTIALITY: You and/or your care partner have signed paperwork which will be entered into your electronic medical record.  These signatures attest to the fact that that the information above on your After Visit Summary has been reviewed and is understood.  Full responsibility of the confidentiality of this discharge information lies with you and/or your care-partner.

## 2018-09-21 NOTE — Progress Notes (Signed)
0940 On rising to dress patient stating she is tight, abd firm on left lower quadrant. Dressed to restroom passed gas, still co abd discomfort, Levsin given, walked the unit and then back to stretcher on left side. Abd soft Checked by Dr Tarri Glenn, patient stating discomfort 0/10, or 3/10 on palpation Patient ok for dc transported by [facility]

## 2018-09-21 NOTE — Progress Notes (Signed)
PT taken to PACU. Monitors in place. VSS. Report given to RN. 

## 2018-09-21 NOTE — Progress Notes (Signed)
Called to room to assist during endoscopic procedure.  Patient ID and intended procedure confirmed with present staff. Received instructions for my participation in the procedure from the performing physician.  

## 2018-09-21 NOTE — Progress Notes (Signed)
Pt's states no medical or surgical changes since previsit or office visit.  KA temp SM vitals

## 2018-09-21 NOTE — Op Note (Signed)
[facility] Patient Name: Pamela Morales Procedure Date: 09/21/2018 8:43 AM MRN: [id number] Endoscopist: Thornton Park MD, MD Age: 78 Referring MD:  Date of Birth: 01/01/1941 Gender: Female Account #: [id number] Procedure:                Colonoscopy Indications:              Surveillance: Personal history of adenomatous                            polyps on last colonoscopy 5 years ago                           Colonsocopy 2010: >1.5 cm adenoma with focal high                            grade dysplasia                           Colonoscopy 2012: 0.33 adenoma                           Colonoscopy 2015: no polyps                           Daughter with colon cancer <60                           Sister with colon cancer in her 86s Medicines:                See the Anesthesia note for documentation of the                            administered medications Procedure:                Pre-Anesthesia Assessment:                           - Prior to the procedure, a History and Physical                            was performed, and patient medications and                            allergies were reviewed. The patient's tolerance of                            previous anesthesia was also reviewed. The risks                            and benefits of the procedure and the sedation                            options and risks were discussed with the patient.                            All questions were answered, and informed  consent                            was obtained. Prior Anticoagulants: The patient has                            taken no previous anticoagulant or antiplatelet                            agents. ASA Grade Assessment: III - A patient with                            severe systemic disease. After reviewing the risks                            and benefits, the patient was deemed in                            satisfactory condition to undergo the procedure.                            After obtaining informed consent, the colonoscope                            was passed under direct vision. Throughout the                            procedure, the patient's blood pressure, pulse, and                            oxygen saturations were monitored continuously. The                            Colonoscope was introduced through the anus and                            advanced to the the terminal ileum, with                            identification of the appendiceal orifice and IC                            valve. A second forward view of the right colon was                            performed. The colonoscopy was performed with                            moderate difficulty due to a redundant colon,                            significant looping and a tortuous colon.  Successful completion of the procedure was aided by                            applying abdominal pressure. The patient tolerated                            the procedure well. The quality of the bowel                            preparation was good. The terminal ileum, ileocecal                            valve, appendiceal orifice, and rectum were                            photographed. Scope In: 8:47:51 AM Scope Out: 9:11:38 AM Scope Withdrawal Time: 0 hours 15 minutes 39 seconds  Total Procedure Duration: 0 hours 23 minutes 47 seconds  Findings:                 The perianal and digital rectal examinations were                            normal.                           Multiple small and large-mouthed diverticula were                            found in the entire colon.                           A 2 mm polyp was found in the hepatic flexure. The                            polyp was sessile. The polyp was removed with a                            cold snare. Resection and retrieval were complete.                            Estimated blood loss was minimal.                            A 2 mm polyp was found in the hepatic flexure. The                            polyp was sessile. Polypectomy was attempted,                            initially using a cold snare. Polyp resection was                            incomplete with this device as the top of the polyp  was removed. This intervention then required a                            different device and polypectomy technique. The                            base of the polyp was removed with a cold biopsy                            forceps. Resection and retrieval were complete.                           A 2 mm polyp was found in the sigmoid colon. The                            polyp was sessile. The polyp was removed with a                            cold snare. Resection and retrieval were complete.                           The exam was otherwise without abnormality on                            direct and retroflexion views. Complications:            No immediate complications. Estimated blood loss:                            Minimal. Estimated Blood Loss:     Estimated blood loss was minimal. Impression:               - Diverticulosis in the entire examined colon.                           - One 2 mm polyp at the hepatic flexure, removed                            with a cold snare. Resected and retrieved.                           - One 2 mm polyp at the hepatic flexure, removed                            with a cold biopsy forceps. Resected and retrieved.                           - One 2 mm polyp in the sigmoid colon, removed with                            a cold snare. Resected and retrieved.                           - The  examination was otherwise normal on direct                            and retroflexion views. Recommendation:           - Patient has a contact number available for                            emergencies. The signs and symptoms of potential                             delayed complications were discussed with the                            patient. Return to normal activities tomorrow.                            Written discharge instructions were provided to the                            patient.                           - High fiber diet.                           - Continue present medications.                           - Await pathology results.                           - Repeat colonoscopy is not recommended given age                            >29. Thornton Park MD, MD 09/21/2018 9:19:51 AM This report has been signed electronically.

## 2018-09-25 ENCOUNTER — Telehealth: Payer: Self-pay

## 2018-09-25 NOTE — Telephone Encounter (Signed)
  Follow up Call-  Call back number 09/21/2018  Post procedure Call Back phone  # [phone]  Permission to leave phone message Yes  Some recent data might be hidden     Patient questions:  Do you have a fever, pain , or abdominal swelling? No. Pain Score  0 *  Have you tolerated food without any problems? Yes.    Have you been able to return to your normal activities? Yes.    Do you have any questions about your discharge instructions: Diet   No. Medications  No. Follow up visit  No.  Do you have questions or concerns about your Care? No.  Actions: * If pain score is 4 or above: No action needed, pain <4.  1. Have you developed a fever since your procedure? no  2.   Have you had an respiratory symptoms (SOB or cough) since your procedure? no  3.   Have you tested positive for COVID 19 since your procedure no  4.   Have you had any family members/close contacts diagnosed with the COVID 19 since your procedure?  no   If yes to any of these questions please route to Joylene John, RN and Alphonsa Gin, Therapist, sports.

## 2018-09-26 ENCOUNTER — Encounter: Payer: Self-pay | Admitting: Gastroenterology

## 2018-10-02 ENCOUNTER — Encounter: Payer: Self-pay | Admitting: Family Medicine

## 2018-10-02 DIAGNOSIS — Z853 Personal history of malignant neoplasm of breast: Secondary | ICD-10-CM | POA: Diagnosis not present

## 2018-10-02 DIAGNOSIS — Z8543 Personal history of malignant neoplasm of ovary: Secondary | ICD-10-CM | POA: Diagnosis not present

## 2018-10-02 DIAGNOSIS — Z1231 Encounter for screening mammogram for malignant neoplasm of breast: Secondary | ICD-10-CM | POA: Diagnosis not present

## 2018-10-02 DIAGNOSIS — Z90710 Acquired absence of both cervix and uterus: Secondary | ICD-10-CM | POA: Diagnosis not present

## 2018-10-02 DIAGNOSIS — R29890 Loss of height: Secondary | ICD-10-CM | POA: Diagnosis not present

## 2018-10-02 DIAGNOSIS — Z8262 Family history of osteoporosis: Secondary | ICD-10-CM | POA: Diagnosis not present

## 2018-10-02 DIAGNOSIS — M8589 Other specified disorders of bone density and structure, multiple sites: Secondary | ICD-10-CM | POA: Diagnosis not present

## 2018-10-02 LAB — HM DEXA SCAN

## 2018-10-09 DIAGNOSIS — Z79899 Other long term (current) drug therapy: Secondary | ICD-10-CM | POA: Diagnosis not present

## 2018-10-09 DIAGNOSIS — L4050 Arthropathic psoriasis, unspecified: Secondary | ICD-10-CM | POA: Diagnosis not present

## 2018-10-09 DIAGNOSIS — L659 Nonscarring hair loss, unspecified: Secondary | ICD-10-CM | POA: Diagnosis not present

## 2018-10-09 DIAGNOSIS — M150 Primary generalized (osteo)arthritis: Secondary | ICD-10-CM | POA: Diagnosis not present

## 2018-10-09 DIAGNOSIS — J449 Chronic obstructive pulmonary disease, unspecified: Secondary | ICD-10-CM | POA: Diagnosis not present

## 2018-10-09 DIAGNOSIS — M545 Low back pain: Secondary | ICD-10-CM | POA: Diagnosis not present

## 2018-10-09 DIAGNOSIS — Z6825 Body mass index (BMI) 25.0-25.9, adult: Secondary | ICD-10-CM | POA: Diagnosis not present

## 2018-10-09 DIAGNOSIS — R5383 Other fatigue: Secondary | ICD-10-CM | POA: Diagnosis not present

## 2018-10-09 DIAGNOSIS — M2550 Pain in unspecified joint: Secondary | ICD-10-CM | POA: Diagnosis not present

## 2018-10-09 DIAGNOSIS — L409 Psoriasis, unspecified: Secondary | ICD-10-CM | POA: Diagnosis not present

## 2018-10-10 ENCOUNTER — Telehealth: Payer: Self-pay | Admitting: *Deleted

## 2018-10-10 ENCOUNTER — Encounter: Payer: Self-pay | Admitting: Family Medicine

## 2018-10-10 NOTE — Telephone Encounter (Signed)
error 

## 2018-11-14 ENCOUNTER — Encounter: Payer: Self-pay | Admitting: Family Medicine

## 2018-11-14 DIAGNOSIS — H35033 Hypertensive retinopathy, bilateral: Secondary | ICD-10-CM | POA: Diagnosis not present

## 2018-11-14 DIAGNOSIS — H40033 Anatomical narrow angle, bilateral: Secondary | ICD-10-CM | POA: Diagnosis not present

## 2018-11-14 DIAGNOSIS — H2513 Age-related nuclear cataract, bilateral: Secondary | ICD-10-CM | POA: Diagnosis not present

## 2018-11-14 DIAGNOSIS — H353132 Nonexudative age-related macular degeneration, bilateral, intermediate dry stage: Secondary | ICD-10-CM | POA: Diagnosis not present

## 2018-11-27 DIAGNOSIS — Z20828 Contact with and (suspected) exposure to other viral communicable diseases: Secondary | ICD-10-CM | POA: Diagnosis not present

## 2018-12-05 ENCOUNTER — Other Ambulatory Visit: Payer: Self-pay | Admitting: *Deleted

## 2018-12-05 MED ORDER — AMLODIPINE BESYLATE 10 MG PO TABS: 10.0000 mg | ORAL_TABLET | Freq: Every day | ORAL | 1 refills | Status: DC

## 2018-12-05 MED ORDER — FLUOXETINE HCL 20 MG PO CAPS: ORAL_CAPSULE | ORAL | 1 refills | Status: DC

## 2018-12-05 NOTE — Addendum Note (Signed)
Addended by: Tammi Sou on: 12/05/2018 02:21 PM   Modules accepted: Orders

## 2018-12-10 ENCOUNTER — Ambulatory Visit (INDEPENDENT_AMBULATORY_CARE_PROVIDER_SITE_OTHER): Payer: PPO | Admitting: Family Medicine

## 2018-12-10 ENCOUNTER — Other Ambulatory Visit: Payer: Self-pay

## 2018-12-10 ENCOUNTER — Ambulatory Visit (INDEPENDENT_AMBULATORY_CARE_PROVIDER_SITE_OTHER)
Admission: RE | Admit: 2018-12-10 | Discharge: 2018-12-10 | Disposition: A | Discharge status: Home / Self Care | Payer: PPO | Source: Ambulatory Visit | Attending: Family Medicine | Admitting: Family Medicine

## 2018-12-10 ENCOUNTER — Encounter: Payer: Self-pay | Admitting: Family Medicine

## 2018-12-10 VITALS — BP 128/78 | HR 54 | Temp 97.2°F | Ht 60.0 in | Wt 138.4 lb

## 2018-12-10 DIAGNOSIS — M7989 Other specified soft tissue disorders: Secondary | ICD-10-CM | POA: Diagnosis not present

## 2018-12-10 DIAGNOSIS — Z23 Encounter for immunization: Secondary | ICD-10-CM

## 2018-12-10 DIAGNOSIS — S0083XA Contusion of other part of head, initial encounter: Secondary | ICD-10-CM

## 2018-12-10 DIAGNOSIS — S81011A Laceration without foreign body, right knee, initial encounter: Secondary | ICD-10-CM | POA: Insufficient documentation

## 2018-12-10 DIAGNOSIS — S8001XA Contusion of right knee, initial encounter: Secondary | ICD-10-CM

## 2018-12-10 DIAGNOSIS — S8991XA Unspecified injury of right lower leg, initial encounter: Secondary | ICD-10-CM | POA: Diagnosis not present

## 2018-12-10 DIAGNOSIS — M25561 Pain in right knee: Secondary | ICD-10-CM | POA: Diagnosis not present

## 2018-12-10 MED ORDER — CEPHALEXIN 500 MG PO CAPS: 500.0000 mg | ORAL_CAPSULE | Freq: Three times a day (TID) | ORAL | 0 refills | Status: DC

## 2018-12-10 NOTE — Assessment & Plan Note (Signed)
Quarter sized abrasion-healing but with collar of redness and swelling  Td today inst to continue soap/water/abx ointment and loose cover when out  Keflex 500 mg tid for 7d Watch for inc pain/red/swelling Update if not starting to improve in a week or if worsening  Fall prevention discussed

## 2018-12-10 NOTE — Assessment & Plan Note (Signed)
From fall on pavement 9 days ago (tripped over neighbor's dog) Abrasion with some healing granulation tissue  Td today  Keflex 500 tid for possible early skin infx inst to call if inc in redness/swelling  Xray today as well Adv rest/elevation/ice

## 2018-12-10 NOTE — Patient Instructions (Signed)
Keep wounds clean with soap and water   Use antibiotic ointment as needed   Ice is ok on the knee Let's check an xray today   Take keflex to fight infection /prevent skin infection  If increased swelling or redness anywhere let me know  If fever let me know    Td (tetanus shot) today also   Watch for headache /dizziness or nausea  These are signs of concussion

## 2018-12-10 NOTE — Progress Notes (Signed)
Subjective:    Patient ID: Pamela Morales, female    DOB: [birth year], 78 y.o.   MRN: [id number]  HPI Pt presents with R knee pain after a fall a week ago   Wt Readings from Last 3 Encounters:  12/10/18 138 lb 7 oz (62.8 kg)  09/21/18 135 lb (61.2 kg)  09/07/18 135 lb (61.2 kg)   27.04 kg/m   Happened a week ago Saturday  Running downhill - neighbor's dog got behind her and tripped her [place] forward and landed on both knees and hit forehead and both hands  Landed on the road  Had a lot of bleeding R knee  Did not loose consciousness Was dazed occ dull headaches No nausea or dizziness    Neighbors came out to help her  Was able to get up- then husband came and got her and went home  Got cleaned up with soap /water/ neosporin   Tried walking one day- fair  Now it hurts to stand on her R leg  R knee was swollen and warm   Last Td was 7/10    Of note pt has psoriatic arthritis-sees rheumatology  Patient Active Problem List   Diagnosis Date Noted  . Contusion of knee, right 12/10/2018  . Laceration of right knee 12/10/2018  . Contusion of forehead 12/10/2018  . 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 Elevated TSH 10/18/2016  . Elevated serum creatinine 10/18/2016  . Estrogen deficiency 03/23/2016  . Encounter for screening mammogram for breast cancer 03/23/2016  . Psoriatic arthritis ([place]) 03/31/2015  . Routine general medical examination at a health care facility 03/25/2015  . Heartburn 03/03/2015  . Wheezing 01/09/2015  . Fatigue 04/28/2014  . Family history of colon cancer 01/01/2013  . Personal history of colonic polyps 01/01/2013  . Colon cancer screening 12/19/2012  . Hearing loss 09/19/2012  . 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 Colon polyps 07/16/2010  . BACK PAIN WITH RADICULOPATHY 03/15/2007  . History of herpes zoster 02/23/2007  . Essential hypertension 02/05/2007  . Osteopenia 02/05/2007  . ECZEMA, ATOPIC  01/29/2007  . ROSACEA 01/29/2007  . BASAL CELL CARCINOMA, HX OF 01/29/2007   Past Medical History:  Diagnosis Date  . Adenomatous polyp of colon   . Allergy   . Anemia    PAST HX   . CA - cancer of ovary   . Cancer ([place]) 02/10   R breast  (est rec neg and brac neg)  . Cataract    FORMING   . COPD (chronic obstructive pulmonary disease) ([place])   . DDD (degenerative disc disease)    in back with chronic pain  . Depression   . Diverticulosis   . Eczema   . Hypertension   . Osteopenia   . Rosacea   . Zoster 02/09   Past Surgical History:  Procedure Laterality Date  . ABDOMINAL HYSTERECTOMY  1996   BSO fibroids, incidental ovarian CA finding  . BASAL CELL CARCINOMA EXCISION  09/2016  . bladder tack    . BREAST SURGERY     rt total mastectomy for breast CA  . CHOLECYSTECTOMY  08/1996   10 yrs ago  . COLONOSCOPY    . lipoma removal  11/07  . POLYPECTOMY     Social History   Tobacco Use  . Smoking status: Former Smoker    Quit date: 01/03/1990    Years since quitting: 28.9  . Smokeless tobacco: Never Used  Substance Use Topics  . Alcohol use: Yes    Alcohol/week: 7.0 standard drinks    Types: 5 Glasses of wine, 2 Standard drinks or equivalent per week  . Drug use: No   Family History  Problem Relation Age of Onset  . Cancer Mother        stomach  . Hypertension Mother   . Heart disease Mother        CAD  . Stomach cancer Mother   . Cancer Father        lung  . Cancer Sister        colon, skin  . Heart disease Sister 28       MI  . Diabetes Sister   . Colon cancer Sister 30  . Heart disease Brother        HTN and MI  . Hypertension Brother   . Colon cancer Daughter 51  . Esophageal cancer Neg Hx   . Colon polyps Neg Hx    Allergies  Allergen Reactions  . Sulfa Antibiotics    Current Outpatient Medications on File Prior to Visit  Medication Sig Dispense Refill  . amLODipine (NORVASC) 10 MG tablet Take 1 tablet (10 mg total) by mouth daily. 90  tablet 1  . Ascorbic Acid (VITAMIN C PO) Take 500 mg by mouth daily.     . budesonide-formoterol (SYMBICORT) 80-4.5 MCG/ACT inhaler Inhale 2 puffs into the lungs 2 (two) times daily. 1 Inhaler 0  . Calcium Carbonate-Vitamin D (CALTRATE 600+D PO) Take 1,200 mg by mouth every evening.     . Cyanocobalamin (B-12 PO) Take 5,000 mg by mouth once a week.     . Doxylamine Succinate, Sleep, (UNISOM PO) Take 50 mg by mouth at bedtime.    Marland Kitchen FLUoxetine (PROZAC) 20 MG capsule TAKE 1 TO 2 CAPSULES BY MOUTH EVERY DAY 180 capsule 1  . losartan (COZAAR) 50 MG tablet Take 1 tablet (50 mg total) by mouth daily. 30 tablet 11  . MAGNESIUM PO Take 500 mg by mouth daily as needed (only takes when working out, for muscle cramps).     . MELATONIN PO Take 10 mg by mouth at bedtime.    . Multiple Vitamin (MULTIVITAMIN) tablet Take 1 tablet by mouth daily.      . Multiple Vitamins-Minerals (PRESERVISION AREDS 2 PO) Take 1 capsule by mouth daily.    . TURMERIC CURCUMIN PO Take 500 mg by mouth daily.     No current facility-administered medications on file prior to visit.     Review of Systems  Constitutional: Negative for activity change, appetite change, fatigue, fever and unexpected weight change.  HENT: Negative for congestion, ear pain, rhinorrhea, sinus pressure and sore throat.   Eyes: Negative for pain, redness and visual disturbance.  Respiratory: Negative for cough, shortness of breath and wheezing.   Cardiovascular: Negative for chest pain and palpitations.  Gastrointestinal: Negative for abdominal pain, blood in stool, constipation and diarrhea.  Endocrine: Negative for polydipsia and polyuria.  Genitourinary: Negative for dysuria, frequency and urgency.  Musculoskeletal: Positive for arthralgias. Negative for back pain and myalgias.  Skin: Positive for wound. Negative for pallor and rash.  Allergic/Immunologic: Negative for environmental allergies.  Neurological: Negative for dizziness, syncope and  headaches.  Hematological: Negative for adenopathy. Does not bruise/bleed easily.  Psychiatric/Behavioral: Negative for decreased concentration and dysphoric mood. The patient is not nervous/anxious.        Objective:   Physical Exam Constitutional:      General:  She is not in acute distress.    Appearance: Normal appearance. She is normal weight. She is not ill-appearing.  HENT:     Head: Normocephalic.     Comments: Small healing abrasion on R forehead with old ecchymosis surrounding R eye Mild tenderness directly over wound  Facial movement is normal  Eye movement is normal    Mouth/Throat:     Mouth: Mucous membranes are moist.  Eyes:     General: No scleral icterus.       Right eye: No discharge.        Left eye: No discharge.     Extraocular Movements: Extraocular movements intact.     Conjunctiva/sclera: Conjunctivae normal.     Pupils: Pupils are equal, round, and reactive to light.  Neck:     Musculoskeletal: Normal range of motion and neck supple. No muscular tenderness.     Vascular: No carotid bruit.  Cardiovascular:     Rate and Rhythm: Regular rhythm. Bradycardia present.     Pulses: Normal pulses.  Pulmonary:     Effort: Pulmonary effort is normal. No respiratory distress.     Breath sounds: Normal breath sounds. No wheezing or rales.  Musculoskeletal:        General: Signs of injury present.     Comments: Few scabs on L knee-healing  Quarter size abrasion/lac on R anterior knee over patella Some granulation tissue and clear drainage 1 cm collar of erythema and swelling Tender to the touch Mild ecchymosis   No other knee tenderness Able to bear wt on knee  No effusion or instability  No crepitus   Lymphadenopathy:     Cervical: No cervical adenopathy.  Skin:    General: Skin is warm and dry.     Comments: Wound-R knee and forehead  Neurological:     Mental Status: She is alert. Mental status is at baseline.     Cranial Nerves: No cranial nerve  deficit.     Motor: No weakness.     Coordination: Coordination normal.     Gait: Gait normal.     Deep Tendon Reflexes: Reflexes normal.  Psychiatric:        Mood and Affect: Mood normal.           Assessment & Plan:   Problem List Items Addressed This Visit      Other   Contusion of knee, right - Primary    From fall on pavement 9 days ago (tripped over neighbor's dog) Abrasion with some healing granulation tissue  Td today  Keflex 500 tid for possible early skin infx inst to call if inc in redness/swelling  Xray today as well Adv rest/elevation/ice        Relevant Orders   DG Knee 4 Views W/Patella Right   Td : Tetanus/diphtheria >7yo Preservative  free (Completed)   Laceration of right knee    Quarter sized abrasion-healing but with collar of redness and swelling  Td today inst to continue soap/water/abx ointment and loose cover when out  Keflex 500 mg tid for 7d Watch for inc pain/red/swelling Update if not starting to improve in a week or if worsening  Fall prevention discussed       Relevant Orders   Td : Tetanus/diphtheria >7yo Preservative  free (Completed)   Contusion of forehead    S/p fall 9 days ago No s/s of concussion  Healing well Td today  Disc what to watch for  Disc fall prevention  Relevant Orders   Td : Tetanus/diphtheria >7yo Preservative  free (Completed)

## 2018-12-10 NOTE — Assessment & Plan Note (Signed)
S/p fall 9 days ago No s/s of concussion  Healing well Td today  Disc what to watch for  Disc fall prevention

## 2018-12-13 DIAGNOSIS — H40033 Anatomical narrow angle, bilateral: Secondary | ICD-10-CM | POA: Diagnosis not present

## 2018-12-13 DIAGNOSIS — H2511 Age-related nuclear cataract, right eye: Secondary | ICD-10-CM | POA: Diagnosis not present

## 2018-12-13 DIAGNOSIS — H25013 Cortical age-related cataract, bilateral: Secondary | ICD-10-CM | POA: Diagnosis not present

## 2018-12-13 DIAGNOSIS — H2513 Age-related nuclear cataract, bilateral: Secondary | ICD-10-CM | POA: Diagnosis not present

## 2018-12-13 DIAGNOSIS — H353132 Nonexudative age-related macular degeneration, bilateral, intermediate dry stage: Secondary | ICD-10-CM | POA: Diagnosis not present

## 2018-12-25 DIAGNOSIS — H25011 Cortical age-related cataract, right eye: Secondary | ICD-10-CM | POA: Diagnosis not present

## 2018-12-25 DIAGNOSIS — H2511 Age-related nuclear cataract, right eye: Secondary | ICD-10-CM | POA: Diagnosis not present

## 2019-01-07 DIAGNOSIS — H25012 Cortical age-related cataract, left eye: Secondary | ICD-10-CM | POA: Diagnosis not present

## 2019-01-07 DIAGNOSIS — H2512 Age-related nuclear cataract, left eye: Secondary | ICD-10-CM | POA: Diagnosis not present

## 2019-01-14 ENCOUNTER — Telehealth: Payer: Self-pay | Admitting: Family Medicine

## 2019-01-14 NOTE — Telephone Encounter (Signed)
Patient's husband called today in regards to an appt the patient had on 12/7 He stated he spoke with insurance and they denied the claim because the patient had a shot done while she was here   Can you look into this for them??

## 2019-01-15 DIAGNOSIS — H25012 Cortical age-related cataract, left eye: Secondary | ICD-10-CM | POA: Diagnosis not present

## 2019-01-15 DIAGNOSIS — H25812 Combined forms of age-related cataract, left eye: Secondary | ICD-10-CM | POA: Diagnosis not present

## 2019-01-15 DIAGNOSIS — H2512 Age-related nuclear cataract, left eye: Secondary | ICD-10-CM | POA: Diagnosis not present

## 2019-01-15 NOTE — Telephone Encounter (Signed)
Email sent to billing for review.

## 2019-01-16 NOTE — Telephone Encounter (Signed)
Left voicemail for patient to call the office. Please let patient know that billing is refiling the claim with the insurance with a new dx code to see if the injection will be covered.

## 2019-01-17 NOTE — Telephone Encounter (Signed)
I spoke with the patient's husband informed him that a claim will be refiled with new dx code.

## 2019-01-30 ENCOUNTER — Ambulatory Visit (INDEPENDENT_AMBULATORY_CARE_PROVIDER_SITE_OTHER): Payer: PPO | Admitting: Family Medicine

## 2019-01-30 ENCOUNTER — Encounter: Payer: Self-pay | Admitting: Family Medicine

## 2019-01-30 DIAGNOSIS — L4050 Arthropathic psoriasis, unspecified: Secondary | ICD-10-CM | POA: Diagnosis not present

## 2019-01-30 DIAGNOSIS — J0110 Acute frontal sinusitis, unspecified: Secondary | ICD-10-CM

## 2019-01-30 DIAGNOSIS — J449 Chronic obstructive pulmonary disease, unspecified: Secondary | ICD-10-CM

## 2019-01-30 DIAGNOSIS — J019 Acute sinusitis, unspecified: Secondary | ICD-10-CM | POA: Insufficient documentation

## 2019-01-30 MED ORDER — AMOXICILLIN-POT CLAVULANATE 875-125 MG PO TABS: 1.0000 | ORAL_TABLET | Freq: Two times a day (BID) | ORAL | 0 refills | Status: DC

## 2019-01-30 NOTE — Patient Instructions (Signed)
Drink fluids and rest  Isolate yourself until symptoms are gone  If you change your mind and want a covid test please let us know   Steam helps congestion So does nasal saline Take augmentin for sinus infection  Update if not starting to improve in a week or if worsening   Especially if new symptoms like tight chest or wheezing

## 2019-01-30 NOTE — Assessment & Plan Note (Signed)
No clinical changes -under care of rheumatology

## 2019-01-30 NOTE — Assessment & Plan Note (Signed)
Controlled with symbicort No issues with current uri at all  Continues care by pulmonary

## 2019-01-30 NOTE — Progress Notes (Signed)
Virtual Visit via Telephone Note  I connected with Pamela Morales on 01/30/19 at 10:45 AM EST by telephone and verified that I am speaking with the correct person using two identifiers.  Location: Patient: home Provider: office   I discussed the limitations, risks, security and privacy concerns of performing an evaluation and management service by telephone and the availability of in person appointments. I also discussed with the patient that there may be a patient responsible charge related to this service. The patient expressed understanding and agreed to proceed.  Parties involved in encounter  Patient: Pamela Morales  Provider:  Loura Pardon MD    History of Present Illness: Pt presents with uri/chest congestion   2 weeks  Gets better and then worse  Now a lot of sinus drainage  Temp 99.5  occ chills up and down No aches    Headaches -over sinuses  Worse on the right   otc : sinus pills (otc) -unsure what - has not taken   Throat- sore /the whole time  Ears -fine  Nose- more runny than stuffy Clear mucous   Cough- phlegm is thick/? Of color  Some wheezing  That is improving  Now more dray    Taste and smell No loss of taste or smell at all   No covid exposures   She is not using her symbicort when she got sick  Has not needed albuterol   Got her first covid shot yesterday   Patient Active Problem List   Diagnosis Date Noted  . Acute sinusitis 01/30/2019  . Contusion of knee, right 12/10/2018  . Laceration of right knee 12/10/2018  . Contusion of forehead 12/10/2018  . COPD with chronic bronchitis and emphysema ([place]) 07/12/2018  . Elevated TSH 10/18/2016  . Elevated serum creatinine 10/18/2016  . Estrogen deficiency 03/23/2016  . Encounter for screening mammogram for breast cancer 03/23/2016  . Psoriatic arthritis ([place]) 03/31/2015  . Routine general medical examination at a health care facility 03/25/2015  . Heartburn 03/03/2015  . Wheezing 01/09/2015   . Fatigue 04/28/2014  . Family history of colon cancer 01/01/2013  . Personal history of colonic polyps 01/01/2013  . Colon cancer screening 12/19/2012  . Hearing loss 09/19/2012  . 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 Colon polyps 07/16/2010  . BACK PAIN WITH RADICULOPATHY 03/15/2007  . History of herpes zoster 02/23/2007  . Essential hypertension 02/05/2007  . Osteopenia 02/05/2007  . ECZEMA, ATOPIC 01/29/2007  . ROSACEA 01/29/2007  . BASAL CELL CARCINOMA, HX OF 01/29/2007   Past Medical History:  Diagnosis Date  . Adenomatous polyp of colon   . Allergy   . Anemia    PAST HX   . CA - cancer of ovary   . Cancer ([place]) 02/10   R breast  (est rec neg and brac neg)  . Cataract    FORMING   . COPD (chronic obstructive pulmonary disease) ([place])   . DDD (degenerative disc disease)    in back with chronic pain  . Depression   . Diverticulosis   . Eczema   . Hypertension   . Osteopenia   . Rosacea   . Zoster 02/09   Past Surgical History:  Procedure Laterality Date  . ABDOMINAL HYSTERECTOMY  1996   BSO fibroids, incidental ovarian CA finding  . BASAL CELL CARCINOMA EXCISION  09/2016  . bladder tack    . BREAST SURGERY     rt total mastectomy for breast CA  .  CHOLECYSTECTOMY  08/1996   10 yrs ago  . COLONOSCOPY    . lipoma removal  11/07  . POLYPECTOMY     Social History   Tobacco Use  . Smoking status: Former Smoker    Quit date: 01/03/1990    Years since quitting: 29.0  . Smokeless tobacco: Never Used  Substance Use Topics  . Alcohol use: Yes    Alcohol/week: 7.0 standard drinks    Types: 5 Glasses of wine, 2 Standard drinks or equivalent per week  . Drug use: No   Family History  Problem Relation Age of Onset  . Cancer Mother        stomach  . Hypertension Mother   . Heart disease Mother        CAD  . Stomach cancer Mother   . Cancer Father        lung  . Cancer Sister        colon, skin  . Heart disease Sister 24       MI  .  Diabetes Sister   . Colon cancer Sister 67  . Heart disease Brother        HTN and MI  . Hypertension Brother   . Colon cancer Daughter 63  . Esophageal cancer Neg Hx   . Colon polyps Neg Hx    Allergies  Allergen Reactions  . Sulfa Antibiotics    Current Outpatient Medications on File Prior to Visit  Medication Sig Dispense Refill  . amLODipine (NORVASC) 10 MG tablet Take 1 tablet (10 mg total) by mouth daily. 90 tablet 1  . Ascorbic Acid (VITAMIN C PO) Take 500 mg by mouth daily.     . budesonide-formoterol (SYMBICORT) 80-4.5 MCG/ACT inhaler Inhale 2 puffs into the lungs 2 (two) times daily. 1 Inhaler 0  . Calcium Carbonate-Vitamin D (CALTRATE 600+D PO) Take 1,200 mg by mouth every evening.     . cephALEXin (KEFLEX) 500 MG capsule Take 1 capsule (500 mg total) by mouth 3 (three) times daily. 21 capsule 0  . Cyanocobalamin (B-12 PO) Take 5,000 mg by mouth once a week.     . Doxylamine Succinate, Sleep, (UNISOM PO) Take 50 mg by mouth at bedtime.    Marland Kitchen FLUoxetine (PROZAC) 20 MG capsule TAKE 1 TO 2 CAPSULES BY MOUTH EVERY DAY 180 capsule 1  . losartan (COZAAR) 50 MG tablet Take 1 tablet (50 mg total) by mouth daily. 30 tablet 11  . MAGNESIUM PO Take 500 mg by mouth daily as needed (only takes when working out, for muscle cramps).     . MELATONIN PO Take 10 mg by mouth at bedtime.    . Multiple Vitamin (MULTIVITAMIN) tablet Take 1 tablet by mouth daily.      . Multiple Vitamins-Minerals (PRESERVISION AREDS 2 PO) Take 1 capsule by mouth daily.    . TURMERIC CURCUMIN PO Take 500 mg by mouth daily.     No current facility-administered medications on file prior to visit.   Review of Systems  Constitutional: Negative for chills, fever and malaise/fatigue.  HENT: Positive for congestion and sinus pain. Negative for ear pain and sore throat.   Eyes: Negative for blurred vision, discharge and redness.  Respiratory: Positive for cough and sputum production. Negative for shortness of breath,  wheezing and stridor.   Cardiovascular: Negative for chest pain, palpitations and leg swelling.  Gastrointestinal: Negative for abdominal pain, diarrhea, nausea and vomiting.  Musculoskeletal: Negative for myalgias.  Skin: Negative for rash.  Neurological:  Positive for headaches. Negative for dizziness.    Observations/Objective: Pt sounds well/like her usual self  Not distressed  Not hoarse Clears throat occasionally  No cough or sob during interview Nl cognition-good historian Nl mood- cheerful     Assessment and Plan: Problem List Items Addressed This Visit      Respiratory   COPD with chronic bronchitis and emphysema ([place])    Controlled with symbicort No issues with current uri at all  Continues care by pulmonary      Acute sinusitis    S/p 2 wk of uri (pt did not get tested for covid)  Cough/chest congestion is improved but she has sinus headache above eyes and low grade temp  She declines covid testing and opts for isolation until symptoms resolve since this started 2 wk ago  Px augmentin as directed  Enc her to keep using symbicort (and albuterol only as needed) If wheezing or tight chest develop would recommend prednisone  inst to call if no improvement or if new symptoms develop  inst to isolate until symptoms are gone      Relevant Medications   amoxicillin-clavulanate (AUGMENTIN) 875-125 MG tablet     Musculoskeletal and Integument   Psoriatic arthritis ([place])    No clinical changes -under care of rheumatology         Follow Up Instructions: Drink fluids and rest  Isolate yourself until symptoms are gone  If you change your mind and want a covid test please let us know   Steam helps congestion So does nasal saline Take augmentin for sinus infection  Update if not starting to improve in a week or if worsening   Especially if new symptoms like tight chest or wheezing   I discussed the assessment and treatment plan with the patient. The patient was  provided an opportunity to ask questions and all were answered. The patient agreed with the plan and demonstrated an understanding of the instructions.   The patient was advised to call back or seek an in-person evaluation if the symptoms worsen or if the condition fails to improve as anticipated.  I provided 14 minutes of non-face-to-face time during this encounter.   Loura Pardon, MD

## 2019-01-30 NOTE — Assessment & Plan Note (Signed)
S/p 2 wk of uri (pt did not get tested for covid)  Cough/chest congestion is improved but she has sinus headache above eyes and low grade temp  She declines covid testing and opts for isolation until symptoms resolve since this started 2 wk ago  Px augmentin as directed  Enc her to keep using symbicort (and albuterol only as needed) If wheezing or tight chest develop would recommend prednisone  inst to call if no improvement or if new symptoms develop  inst to isolate until symptoms are gone

## 2019-04-10 DIAGNOSIS — Z6825 Body mass index (BMI) 25.0-25.9, adult: Secondary | ICD-10-CM | POA: Diagnosis not present

## 2019-04-10 DIAGNOSIS — Z79899 Other long term (current) drug therapy: Secondary | ICD-10-CM | POA: Diagnosis not present

## 2019-04-10 DIAGNOSIS — L409 Psoriasis, unspecified: Secondary | ICD-10-CM | POA: Diagnosis not present

## 2019-04-10 DIAGNOSIS — M2550 Pain in unspecified joint: Secondary | ICD-10-CM | POA: Diagnosis not present

## 2019-04-10 DIAGNOSIS — L659 Nonscarring hair loss, unspecified: Secondary | ICD-10-CM | POA: Diagnosis not present

## 2019-04-10 DIAGNOSIS — R5383 Other fatigue: Secondary | ICD-10-CM | POA: Diagnosis not present

## 2019-04-10 DIAGNOSIS — J449 Chronic obstructive pulmonary disease, unspecified: Secondary | ICD-10-CM | POA: Diagnosis not present

## 2019-04-10 DIAGNOSIS — E663 Overweight: Secondary | ICD-10-CM | POA: Diagnosis not present

## 2019-04-10 DIAGNOSIS — M150 Primary generalized (osteo)arthritis: Secondary | ICD-10-CM | POA: Diagnosis not present

## 2019-04-10 DIAGNOSIS — L4050 Arthropathic psoriasis, unspecified: Secondary | ICD-10-CM | POA: Diagnosis not present

## 2019-04-10 DIAGNOSIS — M545 Low back pain: Secondary | ICD-10-CM | POA: Diagnosis not present

## 2019-05-10 DIAGNOSIS — L570 Actinic keratosis: Secondary | ICD-10-CM | POA: Diagnosis not present

## 2019-05-10 DIAGNOSIS — L578 Other skin changes due to chronic exposure to nonionizing radiation: Secondary | ICD-10-CM | POA: Diagnosis not present

## 2019-05-10 DIAGNOSIS — C44319 Basal cell carcinoma of skin of other parts of face: Secondary | ICD-10-CM | POA: Diagnosis not present

## 2019-05-10 DIAGNOSIS — L821 Other seborrheic keratosis: Secondary | ICD-10-CM | POA: Diagnosis not present

## 2019-05-10 DIAGNOSIS — Z808 Family history of malignant neoplasm of other organs or systems: Secondary | ICD-10-CM | POA: Diagnosis not present

## 2019-05-10 DIAGNOSIS — D485 Neoplasm of uncertain behavior of skin: Secondary | ICD-10-CM | POA: Diagnosis not present

## 2019-05-10 DIAGNOSIS — Z85828 Personal history of other malignant neoplasm of skin: Secondary | ICD-10-CM | POA: Diagnosis not present

## 2019-05-10 DIAGNOSIS — D2262 Melanocytic nevi of left upper limb, including shoulder: Secondary | ICD-10-CM | POA: Diagnosis not present

## 2019-05-10 DIAGNOSIS — D2261 Melanocytic nevi of right upper limb, including shoulder: Secondary | ICD-10-CM | POA: Diagnosis not present

## 2019-05-10 DIAGNOSIS — Z86018 Personal history of other benign neoplasm: Secondary | ICD-10-CM | POA: Diagnosis not present

## 2019-05-10 DIAGNOSIS — L989 Disorder of the skin and subcutaneous tissue, unspecified: Secondary | ICD-10-CM | POA: Diagnosis not present

## 2019-05-10 DIAGNOSIS — D2272 Melanocytic nevi of left lower limb, including hip: Secondary | ICD-10-CM | POA: Diagnosis not present

## 2019-05-10 DIAGNOSIS — D225 Melanocytic nevi of trunk: Secondary | ICD-10-CM | POA: Diagnosis not present

## 2019-05-10 DIAGNOSIS — C44619 Basal cell carcinoma of skin of left upper limb, including shoulder: Secondary | ICD-10-CM | POA: Diagnosis not present

## 2019-05-28 ENCOUNTER — Other Ambulatory Visit: Payer: Self-pay | Admitting: Family Medicine

## 2019-05-28 MED ORDER — AMLODIPINE BESYLATE 10 MG PO TABS: 10.0000 mg | ORAL_TABLET | Freq: Every day | ORAL | 0 refills | Status: DC

## 2019-05-28 NOTE — Addendum Note (Signed)
Addended by: Tammi Sou on: 05/28/2019 11:32 AM   Modules accepted: Orders

## 2019-05-28 NOTE — Addendum Note (Signed)
Addended by: Tammi Sou on: 05/28/2019 04:37 PM   Modules accepted: Orders

## 2019-06-14 DIAGNOSIS — H43811 Vitreous degeneration, right eye: Secondary | ICD-10-CM | POA: Diagnosis not present

## 2019-06-26 DIAGNOSIS — C44319 Basal cell carcinoma of skin of other parts of face: Secondary | ICD-10-CM | POA: Diagnosis not present

## 2019-07-01 ENCOUNTER — Other Ambulatory Visit: Payer: Self-pay | Admitting: Family Medicine

## 2019-07-01 NOTE — Telephone Encounter (Signed)
Please schedule a f/u and then refill until then

## 2019-07-01 NOTE — Telephone Encounter (Signed)
Last f/u was 07/12/18 no future appts (had a few recent acute appts), please advise

## 2019-07-02 NOTE — Telephone Encounter (Signed)
CArrie, can you help get her set up for a MC[facility] with Dr Glori Bickers then send the message back to [facility] to fill the med? Thanks!

## 2019-07-02 NOTE — Telephone Encounter (Signed)
I left a detailed message on patient's voice mail to call back and schedule appointment and medication will be refilled until her appointment.

## 2019-07-03 DIAGNOSIS — D485 Neoplasm of uncertain behavior of skin: Secondary | ICD-10-CM | POA: Diagnosis not present

## 2019-07-04 NOTE — Telephone Encounter (Signed)
Patient returned call and scheduled appointment on 07/16/19.

## 2019-07-09 ENCOUNTER — Ambulatory Visit: Payer: PPO | Admitting: Family Medicine

## 2019-07-16 ENCOUNTER — Ambulatory Visit: Payer: PPO | Admitting: Family Medicine

## 2019-07-16 ENCOUNTER — Encounter: Payer: Self-pay | Admitting: Family Medicine

## 2019-07-16 ENCOUNTER — Ambulatory Visit (INDEPENDENT_AMBULATORY_CARE_PROVIDER_SITE_OTHER): Payer: PPO | Admitting: Family Medicine

## 2019-07-16 ENCOUNTER — Other Ambulatory Visit: Payer: Self-pay

## 2019-07-16 VITALS — BP 136/70 | HR 56 | Temp 96.8°F | Ht 60.0 in | Wt 138.2 lb

## 2019-07-16 DIAGNOSIS — E781 Pure hyperglyceridemia: Secondary | ICD-10-CM | POA: Insufficient documentation

## 2019-07-16 DIAGNOSIS — I10 Essential (primary) hypertension: Secondary | ICD-10-CM | POA: Diagnosis not present

## 2019-07-16 DIAGNOSIS — M8580 Other specified disorders of bone density and structure, unspecified site: Secondary | ICD-10-CM | POA: Diagnosis not present

## 2019-07-16 DIAGNOSIS — L4050 Arthropathic psoriasis, unspecified: Secondary | ICD-10-CM | POA: Diagnosis not present

## 2019-07-16 DIAGNOSIS — Z86000 Personal history of in-situ neoplasm of breast: Secondary | ICD-10-CM

## 2019-07-16 DIAGNOSIS — R7989 Other specified abnormal findings of blood chemistry: Secondary | ICD-10-CM | POA: Diagnosis not present

## 2019-07-16 LAB — COMPREHENSIVE METABOLIC PANEL
ALT: 11 U/L (ref 0–35)
AST: 18 U/L (ref 0–37)
Albumin: 4.1 g/dL (ref 3.5–5.2)
Alkaline Phosphatase: 61 U/L (ref 39–117)
BUN: 26 mg/dL — ABNORMAL HIGH (ref 6–23)
CO2: 29 mEq/L (ref 19–32)
Calcium: 9.6 mg/dL (ref 8.4–10.5)
Chloride: 104 mEq/L (ref 96–112)
Creatinine, Ser: 1.17 mg/dL (ref 0.40–1.20)
GFR: 44.48 mL/min — ABNORMAL LOW (ref >60.00)
Glucose, Bld: 99 mg/dL (ref 70–99)
Potassium: 4.8 mEq/L (ref 3.5–5.1)
Sodium: 138 mEq/L (ref 135–145)
Total Bilirubin: 0.3 mg/dL (ref 0.2–1.2)
Total Protein: 6.7 g/dL (ref 6.0–8.3)

## 2019-07-16 LAB — CBC WITH DIFFERENTIAL/PLATELET
Basophils Absolute: 0 10*3/uL (ref 0.0–0.1)
Basophils Relative: 0.5 % (ref 0.0–3.0)
Eosinophils Absolute: 0.1 10*3/uL (ref 0.0–0.7)
Eosinophils Relative: 1.3 % (ref 0.0–5.0)
HCT: 37 % (ref 36.0–46.0)
Hemoglobin: 12.2 g/dL (ref 12.0–15.0)
Lymphocytes Relative: 29 % (ref 12.0–46.0)
Lymphs Abs: 2.5 10*3/uL (ref 0.7–4.0)
MCHC: 33 g/dL (ref 30.0–36.0)
MCV: 84.1 fl (ref 78.0–100.0)
Monocytes Absolute: 0.6 10*3/uL (ref 0.1–1.0)
Monocytes Relative: 7.3 % (ref 3.0–12.0)
Neutro Abs: 5.4 10*3/uL (ref 1.4–7.7)
Neutrophils Relative %: 61.9 % (ref 43.0–77.0)
Platelets: 219 10*3/uL (ref 150.0–400.0)
RBC: 4.4 Mil/uL (ref 3.87–5.11)
RDW: 13.5 % (ref 11.5–15.5)
WBC: 8.8 10*3/uL (ref 4.0–10.5)

## 2019-07-16 LAB — LIPID PANEL
Cholesterol: 197 mg/dL (ref 0–200)
HDL: 34.9 mg/dL — ABNORMAL LOW (ref >39.00)
NonHDL: 162.32
Total CHOL/HDL Ratio: 6
Triglycerides: 334 mg/dL — ABNORMAL HIGH (ref 0.0–149.0)
VLDL: 66.8 mg/dL — ABNORMAL HIGH (ref 0.0–40.0)

## 2019-07-16 LAB — LDL CHOLESTEROL, DIRECT: Direct LDL: 111 mg/dL

## 2019-07-16 LAB — T4, FREE: Free T4: 0.83 ng/dL (ref 0.60–1.60)

## 2019-07-16 LAB — TSH: TSH: 3 u[IU]/mL (ref 0.35–4.50)

## 2019-07-16 MED ORDER — AMLODIPINE BESYLATE 10 MG PO TABS: 10.0000 mg | ORAL_TABLET | Freq: Every day | ORAL | 3 refills | Status: DC

## 2019-07-16 MED ORDER — FLUOXETINE HCL 20 MG PO CAPS: 40.0000 mg | ORAL_CAPSULE | Freq: Every day | ORAL | 3 refills | Status: DC

## 2019-07-16 MED ORDER — LOSARTAN POTASSIUM 50 MG PO TABS: 50.0000 mg | ORAL_TABLET | Freq: Every day | ORAL | 3 refills | Status: DC

## 2019-07-16 NOTE — Assessment & Plan Note (Signed)
dexa utd 9/20  No fractures One fall in past year Taking ca and D Exercise-is good

## 2019-07-16 NOTE — Patient Instructions (Signed)
Take care of yourself Blood pressure is better on 2nd check   Labs today  Keep exercising  Keep drinking lots of water

## 2019-07-16 NOTE — Assessment & Plan Note (Signed)
bp in fair control at this time  BP Readings from Last 1 Encounters:  07/16/19 136/70   No changes needed Most recent labs reviewed  Disc lifstyle change with low sodium diet and exercise

## 2019-07-16 NOTE — Progress Notes (Signed)
Subjective:    Patient ID: Pamela Morales, female    DOB: [birth year], 79 y.o.   MRN: [id number]  This visit occurred during the SARS-CoV-2 public health emergency.  Safety protocols were in place, including screening questions prior to the visit, additional usage of staff PPE, and extensive cleaning of exam room while observing appropriate contact time as indicated for disinfecting solutions.    HPI Pt presents for f/u of chronic health problems  Wt Readings from Last 3 Encounters:  07/16/19 138 lb 4 oz (62.7 kg)  12/10/18 138 lb 7 oz (62.8 kg)  09/21/18 135 lb (61.2 kg)   27.00 kg/m Feeling good  Very active   covid status -immunized /will call with dates of immunizations    HTN bp is stable today  No cp or palpitations or headaches or edema  No side effects to medicines  BP Readings from Last 3 Encounters:  07/16/19 (!) 148/72  12/10/18 128/78  09/21/18 102/63    Amlodipine 10 mg  Losartan 50 mg   Yesterday bp was high at clinic also   Drinking lots of water  Avoids nsaids H/o renal insuff   Psoriatic arthritis -in a clinical trial with infusion/she is not sure what it is   Osteopenia -dexa 9/20 -stable [place] - no falls since last trip  fx - none Supplements -ca and D Exercise -lots of /gym/working   Past h/o breast cancer Nl mammogram 9/20 Self exam -no lumps   Past elevated tsh Lab Results  Component Value Date   TSH 5.03 (H) 11/28/2016   FT4 was fine then   Past elevated cr Lab Results  Component Value Date   CREATININE 1.38 (H) 07/26/2018   BUN 32 (H) 07/26/2018   NA 139 07/26/2018   K 4.2 07/26/2018   CL 104 07/26/2018   CO2 26 07/26/2018   Mood is very good with fluoxetine  Not a lot of stress   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 Laceration of right knee 12/10/2018  . COPD with chronic bronchitis and emphysema ([place]) 07/12/2018  . Elevated  TSH 10/18/2016  . Elevated serum creatinine 10/18/2016  . Estrogen deficiency 03/23/2016  . Encounter for screening mammogram for breast cancer 03/23/2016  . Psoriatic arthritis ([place]) 03/31/2015  . Routine general medical examination at a health care facility 03/25/2015  . Heartburn 03/03/2015  . Wheezing 01/09/2015  . Fatigue 04/28/2014  . Family history of colon cancer 01/01/2013  . Personal history of colonic polyps 01/01/2013  . Colon cancer screening 12/19/2012  . Hearing loss 09/19/2012  . 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 Colon polyps 07/16/2010  . BACK PAIN WITH RADICULOPATHY 03/15/2007  . History of herpes zoster 02/23/2007  . Essential hypertension 02/05/2007  . Osteopenia 02/05/2007  . ECZEMA, ATOPIC 01/29/2007  . ROSACEA 01/29/2007  . BASAL CELL CARCINOMA, HX OF 01/29/2007   Past Medical History:  Diagnosis Date  . Adenomatous polyp of colon   . Allergy   . Anemia    PAST HX   . CA - cancer of ovary   . Cancer ([place]) 02/10   R breast  (est rec neg and brac neg)  . Cataract    FORMING   . COPD (chronic obstructive pulmonary disease) ([place])   . DDD (degenerative disc disease)    in back with chronic pain  . Depression   .  Diverticulosis   . Eczema   . Hypertension   . Osteopenia   . Rosacea   . Zoster 02/09   Past Surgical History:  Procedure Laterality Date  . ABDOMINAL HYSTERECTOMY  1996   BSO fibroids, incidental ovarian CA finding  . BASAL CELL CARCINOMA EXCISION  09/2016  . bladder tack    . BREAST SURGERY     rt total mastectomy for breast CA  . CHOLECYSTECTOMY  08/1996   10 yrs ago  . COLONOSCOPY    . lipoma removal  11/07  . POLYPECTOMY     Social History   Tobacco Use  . Smoking status: Former Smoker    Quit date: 01/03/1990    Years since quitting: 29.5  . Smokeless tobacco: Never Used  Vaping Use  . Vaping Use: Never used  Substance Use Topics  . Alcohol use: Yes    Alcohol/week: 7.0 standard drinks    Types: 5  Glasses of wine, 2 Standard drinks or equivalent per week  . Drug use: No   Family History  Problem Relation Age of Onset  . Cancer Mother        stomach  . Hypertension Mother   . Heart disease Mother        CAD  . Stomach cancer Mother   . Cancer Father        lung  . Cancer Sister        colon, skin  . Heart disease Sister 35       MI  . Diabetes Sister   . Colon cancer Sister 6  . Heart disease Brother        HTN and MI  . Hypertension Brother   . Colon cancer Daughter 73  . Esophageal cancer Neg Hx   . Colon polyps Neg Hx    Allergies  Allergen Reactions  . Sulfa Antibiotics    Current Outpatient Medications on File Prior to Visit  Medication Sig Dispense Refill  . budesonide-formoterol (SYMBICORT) 80-4.5 MCG/ACT inhaler Inhale 2 puffs into the lungs 2 (two) times daily. 1 Inhaler 0  . Calcium Carbonate-Vitamin D (CALTRATE 600+D PO) Take 1,200 mg by mouth every evening.     . Cyanocobalamin (B-12 PO) Take 5,000 mg by mouth once a week.     . Doxylamine Succinate, Sleep, (UNISOM PO) Take 50 mg by mouth at bedtime.    Marland Kitchen MAGNESIUM PO Take 500 mg by mouth daily as needed (only takes when working out, for muscle cramps).     . MELATONIN PO Take 10 mg by mouth at bedtime.    . Multiple Vitamin (MULTIVITAMIN) tablet Take 1 tablet by mouth daily.      . Multiple Vitamins-Minerals (PRESERVISION AREDS 2 PO) Take 1 capsule by mouth daily.    . TURMERIC CURCUMIN PO Take 500 mg by mouth daily.     No current facility-administered medications on file prior to visit.    Review of Systems  Constitutional: Negative for activity change, appetite change, fatigue, fever and unexpected weight change.  HENT: Negative for congestion, ear pain, rhinorrhea, sinus pressure and sore throat.   Eyes: Negative for pain, redness and visual disturbance.  Respiratory: Negative for cough, shortness of breath and wheezing.   Cardiovascular: Negative for chest pain and palpitations.    Gastrointestinal: Negative for abdominal pain, blood in stool, constipation and diarrhea.  Endocrine: Negative for polydipsia and polyuria.  Genitourinary: Negative for dysuria, frequency and urgency.  Musculoskeletal: Negative for arthralgias, back pain and  myalgias.  Skin: Negative for pallor and rash.       Recent skin cancers removed  Allergic/Immunologic: Negative for environmental allergies.  Neurological: Negative for dizziness, syncope and headaches.  Hematological: Negative for adenopathy. Does not bruise/bleed easily.  Psychiatric/Behavioral: Negative for decreased concentration and dysphoric mood. The patient is not nervous/anxious.        Objective:   Physical Exam Constitutional:      General: She is not in acute distress.    Appearance: Normal appearance. She is well-developed and normal weight. She is not ill-appearing or diaphoretic.  HENT:     Head: Normocephalic and atraumatic.  Eyes:     Conjunctiva/sclera: Conjunctivae normal.     Pupils: Pupils are equal, round, and reactive to light.  Neck:     Thyroid: No thyromegaly.     Vascular: No carotid bruit or JVD.  Cardiovascular:     Rate and Rhythm: Normal rate and regular rhythm.     Heart sounds: Normal heart sounds. No gallop.   Pulmonary:     Effort: Pulmonary effort is normal. No respiratory distress.     Breath sounds: Normal breath sounds. No wheezing or rales.  Abdominal:     General: Bowel sounds are normal. There is no distension or abdominal bruit.     Palpations: Abdomen is soft. There is no mass.     Tenderness: There is no abdominal tenderness.  Musculoskeletal:     Cervical back: Normal range of motion and neck supple.     Right lower leg: No edema.     Left lower leg: No edema.     Comments: No kyphosis   Lymphadenopathy:     Cervical: No cervical adenopathy.  Skin:    General: Skin is warm and dry.     Findings: No rash.     Comments: Healing scar R face from recent basal cell surgery  near eye   Healing incision from skin ca removal on L back- slt swelling w/o redness and skin edges are well approx  Neurological:     Mental Status: She is alert.     Cranial Nerves: No cranial nerve deficit.     Sensory: No sensory deficit.     Coordination: Coordination normal.     Deep Tendon Reflexes: Reflexes are normal and symmetric. Reflexes normal.  Psychiatric:        Mood and Affect: Mood normal.        Cognition and Memory: Cognition and memory normal.           Assessment & Plan:   Problem List Items Addressed This Visit      Cardiovascular and Mediastinum   Essential hypertension - Primary    bp in fair control at this time  BP Readings from Last 1 Encounters:  07/16/19 136/70   No changes needed Most recent labs reviewed  Disc lifstyle change with low sodium diet and exercise        Relevant Medications   amLODipine (NORVASC) 10 MG tablet   losartan (COZAAR) 50 MG tablet   Other Relevant Orders   CBC with Differential/Platelet   Comprehensive metabolic panel   Lipid panel   TSH     Musculoskeletal and Integument   Osteopenia    dexa utd 9/20  No fractures One fall in past year Taking ca and D Exercise-is good      Psoriatic arthritis ([place])    Pt on a study drug -unsure what it is but working well  Had rheum visit yesterday  Stays very active        Other   History of carcinoma in situ of breast    Nl mammogram in fall        Elevated TSH    No clinical changes  TSH and FT4 today      Relevant Orders   TSH   T4, free   Elevated serum creatinine    Drinking water bp is controlled Avoids nsaids Does take losartan   Lab today      Relevant Orders   Comprehensive metabolic panel

## 2019-07-16 NOTE — Assessment & Plan Note (Signed)
Drinking water bp is controlled Avoids nsaids Does take losartan   Lab today

## 2019-07-16 NOTE — Assessment & Plan Note (Signed)
Nl mammogram in fall

## 2019-07-16 NOTE — Assessment & Plan Note (Signed)
No clinical changes  TSH and FT4 today

## 2019-07-16 NOTE — Assessment & Plan Note (Signed)
Pt on a study drug -unsure what it is but working well  Had rheum visit yesterday  Stays very active

## 2019-07-17 ENCOUNTER — Telehealth: Payer: Self-pay | Admitting: *Deleted

## 2019-07-17 ENCOUNTER — Encounter: Payer: Self-pay | Admitting: Family Medicine

## 2019-07-17 DIAGNOSIS — H43811 Vitreous degeneration, right eye: Secondary | ICD-10-CM | POA: Diagnosis not present

## 2019-07-17 DIAGNOSIS — H35033 Hypertensive retinopathy, bilateral: Secondary | ICD-10-CM | POA: Diagnosis not present

## 2019-07-17 DIAGNOSIS — H35013 Changes in retinal vascular appearance, bilateral: Secondary | ICD-10-CM | POA: Diagnosis not present

## 2019-07-17 DIAGNOSIS — H353132 Nonexudative age-related macular degeneration, bilateral, intermediate dry stage: Secondary | ICD-10-CM | POA: Diagnosis not present

## 2019-07-17 DIAGNOSIS — H04123 Dry eye syndrome of bilateral lacrimal glands: Secondary | ICD-10-CM | POA: Diagnosis not present

## 2019-07-17 NOTE — Telephone Encounter (Signed)
Left VM requesting pt to call the office back regarding lab results  

## 2019-07-25 DIAGNOSIS — L988 Other specified disorders of the skin and subcutaneous tissue: Secondary | ICD-10-CM | POA: Diagnosis not present

## 2019-07-25 DIAGNOSIS — C44619 Basal cell carcinoma of skin of left upper limb, including shoulder: Secondary | ICD-10-CM | POA: Diagnosis not present

## 2019-07-29 ENCOUNTER — Encounter: Payer: Self-pay | Admitting: *Deleted

## 2019-07-29 NOTE — Telephone Encounter (Signed)
Letter mailed with labs and Dr. Marliss Coots comments

## 2019-08-14 ENCOUNTER — Encounter: Payer: Self-pay | Admitting: Genetic Counselor

## 2019-10-09 DIAGNOSIS — Z1231 Encounter for screening mammogram for malignant neoplasm of breast: Secondary | ICD-10-CM | POA: Diagnosis not present

## 2019-10-10 DIAGNOSIS — L4050 Arthropathic psoriasis, unspecified: Secondary | ICD-10-CM | POA: Diagnosis not present

## 2019-10-10 DIAGNOSIS — J449 Chronic obstructive pulmonary disease, unspecified: Secondary | ICD-10-CM | POA: Diagnosis not present

## 2019-10-10 DIAGNOSIS — Z6824 Body mass index (BMI) 24.0-24.9, adult: Secondary | ICD-10-CM | POA: Diagnosis not present

## 2019-10-10 DIAGNOSIS — L659 Nonscarring hair loss, unspecified: Secondary | ICD-10-CM | POA: Diagnosis not present

## 2019-10-10 DIAGNOSIS — M150 Primary generalized (osteo)arthritis: Secondary | ICD-10-CM | POA: Diagnosis not present

## 2019-10-10 DIAGNOSIS — Z79899 Other long term (current) drug therapy: Secondary | ICD-10-CM | POA: Diagnosis not present

## 2019-10-10 DIAGNOSIS — M2550 Pain in unspecified joint: Secondary | ICD-10-CM | POA: Diagnosis not present

## 2019-10-10 DIAGNOSIS — L409 Psoriasis, unspecified: Secondary | ICD-10-CM | POA: Diagnosis not present

## 2019-10-10 DIAGNOSIS — M545 Low back pain, unspecified: Secondary | ICD-10-CM | POA: Diagnosis not present

## 2019-10-10 DIAGNOSIS — R5383 Other fatigue: Secondary | ICD-10-CM | POA: Diagnosis not present

## 2019-10-21 DIAGNOSIS — H43811 Vitreous degeneration, right eye: Secondary | ICD-10-CM | POA: Diagnosis not present

## 2019-10-21 DIAGNOSIS — H5319 Other subjective visual disturbances: Secondary | ICD-10-CM | POA: Diagnosis not present

## 2019-10-21 DIAGNOSIS — H02135 Senile ectropion of left lower eyelid: Secondary | ICD-10-CM | POA: Diagnosis not present

## 2019-10-21 DIAGNOSIS — H524 Presbyopia: Secondary | ICD-10-CM | POA: Diagnosis not present

## 2019-11-21 DIAGNOSIS — L570 Actinic keratosis: Secondary | ICD-10-CM | POA: Diagnosis not present

## 2019-11-21 DIAGNOSIS — D225 Melanocytic nevi of trunk: Secondary | ICD-10-CM | POA: Diagnosis not present

## 2019-11-21 DIAGNOSIS — L578 Other skin changes due to chronic exposure to nonionizing radiation: Secondary | ICD-10-CM | POA: Diagnosis not present

## 2019-11-21 DIAGNOSIS — Z86018 Personal history of other benign neoplasm: Secondary | ICD-10-CM | POA: Diagnosis not present

## 2019-11-21 DIAGNOSIS — D2262 Melanocytic nevi of left upper limb, including shoulder: Secondary | ICD-10-CM | POA: Diagnosis not present

## 2019-11-21 DIAGNOSIS — L821 Other seborrheic keratosis: Secondary | ICD-10-CM | POA: Diagnosis not present

## 2019-11-21 DIAGNOSIS — Z808 Family history of malignant neoplasm of other organs or systems: Secondary | ICD-10-CM | POA: Diagnosis not present

## 2019-11-21 DIAGNOSIS — D485 Neoplasm of uncertain behavior of skin: Secondary | ICD-10-CM | POA: Diagnosis not present

## 2019-11-21 DIAGNOSIS — Z85828 Personal history of other malignant neoplasm of skin: Secondary | ICD-10-CM | POA: Diagnosis not present

## 2019-11-21 DIAGNOSIS — C4441 Basal cell carcinoma of skin of scalp and neck: Secondary | ICD-10-CM | POA: Diagnosis not present

## 2019-11-21 DIAGNOSIS — C44319 Basal cell carcinoma of skin of other parts of face: Secondary | ICD-10-CM | POA: Diagnosis not present

## 2019-11-21 DIAGNOSIS — D2261 Melanocytic nevi of right upper limb, including shoulder: Secondary | ICD-10-CM | POA: Diagnosis not present

## 2019-11-21 DIAGNOSIS — D2272 Melanocytic nevi of left lower limb, including hip: Secondary | ICD-10-CM | POA: Diagnosis not present

## 2020-01-21 ENCOUNTER — Telehealth: Payer: PPO | Admitting: Family Medicine

## 2020-01-27 ENCOUNTER — Ambulatory Visit (INDEPENDENT_AMBULATORY_CARE_PROVIDER_SITE_OTHER)
Admission: RE | Admit: 2020-01-27 | Discharge: 2020-01-27 | Disposition: A | Discharge status: Home / Self Care | Payer: PPO | Source: Ambulatory Visit | Attending: Family Medicine | Admitting: Family Medicine

## 2020-01-27 ENCOUNTER — Other Ambulatory Visit: Payer: Self-pay

## 2020-01-27 ENCOUNTER — Encounter: Payer: Self-pay | Admitting: Family Medicine

## 2020-01-27 ENCOUNTER — Ambulatory Visit (INDEPENDENT_AMBULATORY_CARE_PROVIDER_SITE_OTHER): Payer: PPO | Admitting: Family Medicine

## 2020-01-27 VITALS — BP 110/66 | HR 56 | Temp 97.0°F | Ht 60.0 in | Wt 135.6 lb

## 2020-01-27 DIAGNOSIS — J449 Chronic obstructive pulmonary disease, unspecified: Secondary | ICD-10-CM | POA: Diagnosis not present

## 2020-01-27 DIAGNOSIS — L4050 Arthropathic psoriasis, unspecified: Secondary | ICD-10-CM

## 2020-01-27 DIAGNOSIS — M79672 Pain in left foot: Secondary | ICD-10-CM

## 2020-01-27 NOTE — Assessment & Plan Note (Signed)
No recent problems

## 2020-01-27 NOTE — Progress Notes (Signed)
Subjective:    Patient ID: Pamela Morales, female    DOB: 1940-11-22, 80 y.o.   MRN: [id number]  This visit occurred during the SARS-CoV-2 public health emergency.  Safety protocols were in place, including screening questions prior to the visit, additional usage of staff PPE, and extensive cleaning of exam room while observing appropriate contact time as indicated for disinfecting solutions.    HPI Pt presents with L foot problem  Wt Readings from Last 3 Encounters:  01/27/20 135 lb 9 oz (61.5 kg)  07/16/19 138 lb 4 oz (62.7 kg)  12/10/18 138 lb 7 oz (62.8 kg)   26.48 kg/m  About 3 weeks ago pt went to get out of bed and her foot "collapsed"  Kind of like it gave out  At times seems better and other times worse  Feels weak   No heel pain  Some pain in mid foot when she steps on it  Sharp pain overall  No throbbing or aching  No accidents or injuries known  Exercise- uses the elliptical and weights/in good shape   No back pain or problems  In past had radiculopathy (may have been that side)   09- L5 herniated disc in the past     Patient Active Problem List   Diagnosis Date Noted  . Left foot pain 01/27/2020  . High triglycerides 07/16/2019  . Laceration of right knee 12/10/2018  . COPD with chronic bronchitis and emphysema ([place]) 07/12/2018  . Elevated TSH 10/18/2016  . Elevated serum creatinine 10/18/2016  . Estrogen deficiency 03/23/2016  . Encounter for screening mammogram for breast cancer 03/23/2016  . Psoriatic arthritis ([place]) 03/31/2015  . Routine general medical examination at a health care facility 03/25/2015  . Heartburn 03/03/2015  . Wheezing 01/09/2015  . Fatigue 04/28/2014  . Family history of colon cancer 01/01/2013  . Personal history of colonic polyps 01/01/2013  . Colon cancer screening 12/19/2012  . Hearing loss 09/19/2012  . 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 Colon polyps 07/16/2010  . BACK PAIN WITH RADICULOPATHY  03/15/2007  . History of herpes zoster 02/23/2007  . Essential hypertension 02/05/2007  . Osteopenia 02/05/2007  . ECZEMA, ATOPIC 01/29/2007  . ROSACEA 01/29/2007  . BASAL CELL CARCINOMA, HX OF 01/29/2007   Past Medical History:  Diagnosis Date  . Adenomatous polyp of colon   . Allergy   . Anemia    PAST HX   . CA - cancer of ovary   . Cancer ([place]) 02/10   R breast  (est rec neg and brac neg)  . Cataract    FORMING   . COPD (chronic obstructive pulmonary disease) ([place])   . DDD (degenerative disc disease)    in back with chronic pain  . Depression   . Diverticulosis   . Eczema   . Hypertension   . Osteopenia   . Rosacea   . Zoster 02/09   Past Surgical History:  Procedure Laterality Date  . ABDOMINAL HYSTERECTOMY  1996   BSO fibroids, incidental ovarian CA finding  . BASAL CELL CARCINOMA EXCISION  09/2016  . bladder tack    . BREAST SURGERY     rt total mastectomy for breast CA  . CHOLECYSTECTOMY  08/1996   10 yrs ago  . COLONOSCOPY    . lipoma removal  11/07  . POLYPECTOMY     Social History   Tobacco Use  . Smoking status: Former Smoker    Quit date: 01/03/1990  Years since quitting: 30.0  . Smokeless tobacco: Never Used  Vaping Use  . Vaping Use: Never used  Substance Use Topics  . Alcohol use: Yes    Alcohol/week: 7.0 standard drinks    Types: 5 Glasses of wine, 2 Standard drinks or equivalent per week  . Drug use: No   Family History  Problem Relation Age of Onset  . Cancer Mother        stomach  . Hypertension Mother   . Heart disease Mother        CAD  . Stomach cancer Mother   . Cancer Father        lung  . Cancer Sister        colon, skin  . Heart disease Sister 67       MI  . Diabetes Sister   . Colon cancer Sister 45  . Heart disease Brother        HTN and MI  . Hypertension Brother   . Colon cancer Daughter 53  . Esophageal cancer Neg Hx   . Colon polyps Neg Hx    Allergies  Allergen Reactions  . Sulfa Antibiotics     Current Outpatient Medications on File Prior to Visit  Medication Sig Dispense Refill  . amLODipine (NORVASC) 10 MG tablet Take 1 tablet (10 mg total) by mouth daily. 90 tablet 3  . budesonide-formoterol (SYMBICORT) 80-4.5 MCG/ACT inhaler Inhale 2 puffs into the lungs 2 (two) times daily. 1 Inhaler 0  . Calcium Carbonate-Vitamin D (CALTRATE 600+D PO) Take 1,200 mg by mouth every evening.     . Cyanocobalamin (B-12 PO) Take 5,000 mg by mouth once a week.     . Doxylamine Succinate, Sleep, (UNISOM PO) Take 50 mg by mouth at bedtime.    Marland Kitchen FLUoxetine (PROZAC) 20 MG capsule Take 2 capsules (40 mg total) by mouth daily. 180 capsule 3  . losartan (COZAAR) 50 MG tablet Take 1 tablet (50 mg total) by mouth daily. 90 tablet 3  . MAGNESIUM PO Take 500 mg by mouth daily as needed (only takes when working out, for muscle cramps).     . MELATONIN PO Take 10 mg by mouth at bedtime.    . Multiple Vitamin (MULTIVITAMIN) tablet Take 1 tablet by mouth daily.    . Multiple Vitamins-Minerals (PRESERVISION AREDS 2 PO) Take 1 capsule by mouth daily.    . TURMERIC CURCUMIN PO Take 500 mg by mouth daily.     No current facility-administered medications on file prior to visit.    Review of Systems  Constitutional: Negative for activity change, appetite change, fatigue, fever and unexpected weight change.  HENT: Negative for congestion, ear pain, rhinorrhea, sinus pressure and sore throat.   Eyes: Negative for pain, redness and visual disturbance.  Respiratory: Negative for cough, shortness of breath and wheezing.   Cardiovascular: Negative for chest pain and palpitations.  Gastrointestinal: Negative for abdominal pain, blood in stool, constipation and diarrhea.  Endocrine: Negative for polydipsia and polyuria.  Genitourinary: Negative for dysuria, frequency and urgency.  Musculoskeletal: Negative for arthralgias, back pain and myalgias.       L foot pain   Skin: Negative for pallor and rash.   Allergic/Immunologic: Negative for environmental allergies.  Neurological: Negative for dizziness, syncope and headaches.  Hematological: Negative for adenopathy. Does not bruise/bleed easily.  Psychiatric/Behavioral: Negative for decreased concentration and dysphoric mood. The patient is not nervous/anxious.        Objective:   Physical Exam Constitutional:  Appearance: Normal appearance. She is normal weight. She is not ill-appearing.  Eyes:     Conjunctiva/sclera: Conjunctivae normal.     Pupils: Pupils are equal, round, and reactive to light.  Cardiovascular:     Rate and Rhythm: Regular rhythm. Bradycardia present.     Pulses: Normal pulses.  Pulmonary:     Effort: Pulmonary effort is normal. No respiratory distress.     Breath sounds: Normal breath sounds.  Musculoskeletal:     Cervical back: Neck supple.     Right lower leg: Edema present.     Left lower leg: No edema.     Right foot: Bunion present. No swelling, deformity, foot drop, tenderness or crepitus. Normal pulse.     Left foot: Normal range of motion and normal capillary refill. Bunion and tenderness present. No swelling, deformity, foot drop, prominent metatarsal heads or crepitus. Normal pulse.     Comments: Tender under metatarsal heads /plantar surface of foot  morso between 2,3rd metatarsals  No swelling/bruising or weakness noted   Skin:    General: Skin is warm and dry.     Findings: No erythema or rash.  Neurological:     Mental Status: She is alert.     Sensory: No sensory deficit.     Motor: No weakness.     Coordination: Coordination normal.     Deep Tendon Reflexes: Reflexes normal.  Psychiatric:        Mood and Affect: Mood normal.           Assessment & Plan:   Problem List Items Addressed This Visit      Respiratory   COPD with chronic bronchitis and emphysema (HCC)    No recent problems         Musculoskeletal and Integument   Psoriatic arthritis ([place])    Was on a study  injectable drug that helped  Has samples then will come off of  She does not know name of med but too $$ to stay on         Other   Left foot pain - Primary    Under metatarsal heads/moreso 2,3 rd metatarsals  Disc poss stress fracture given exercise schedule  Xray now  Also in differential would be neuroma vs metatarsalgia  If neg film consider podiatry referral   Adv to elevate  Ice prn  Do not go barefoot        Relevant Orders   DG Foot Complete Left

## 2020-01-27 NOTE — Assessment & Plan Note (Signed)
Under metatarsal heads/moreso 2,3 rd metatarsals  Disc poss stress fracture given exercise schedule  Xray now  Also in differential would be neuroma vs metatarsalgia  If neg film consider podiatry referral   Adv to elevate  Ice prn  Do not go barefoot

## 2020-01-27 NOTE — Patient Instructions (Signed)
Elevate your foot  Wear shoes when walking   Try ice several times per day for 10 minutes   Xray now  If negative ? Neuroma -would refer to podiatry

## 2020-01-27 NOTE — Assessment & Plan Note (Signed)
Was on a study injectable drug that helped  Has samples then will come off of  She does not know name of med but too $$ to stay on

## 2020-02-04 DIAGNOSIS — D485 Neoplasm of uncertain behavior of skin: Secondary | ICD-10-CM | POA: Diagnosis not present

## 2020-02-05 DIAGNOSIS — D485 Neoplasm of uncertain behavior of skin: Secondary | ICD-10-CM | POA: Diagnosis not present

## 2020-02-18 DIAGNOSIS — C4441 Basal cell carcinoma of skin of scalp and neck: Secondary | ICD-10-CM | POA: Diagnosis not present

## 2020-03-11 DIAGNOSIS — H524 Presbyopia: Secondary | ICD-10-CM | POA: Diagnosis not present

## 2020-03-11 DIAGNOSIS — H02135 Senile ectropion of left lower eyelid: Secondary | ICD-10-CM | POA: Diagnosis not present

## 2020-04-09 DIAGNOSIS — L659 Nonscarring hair loss, unspecified: Secondary | ICD-10-CM | POA: Diagnosis not present

## 2020-04-09 DIAGNOSIS — M150 Primary generalized (osteo)arthritis: Secondary | ICD-10-CM | POA: Diagnosis not present

## 2020-04-09 DIAGNOSIS — Z79899 Other long term (current) drug therapy: Secondary | ICD-10-CM | POA: Diagnosis not present

## 2020-04-09 DIAGNOSIS — L4050 Arthropathic psoriasis, unspecified: Secondary | ICD-10-CM | POA: Diagnosis not present

## 2020-04-09 DIAGNOSIS — Z111 Encounter for screening for respiratory tuberculosis: Secondary | ICD-10-CM | POA: Diagnosis not present

## 2020-04-09 DIAGNOSIS — J449 Chronic obstructive pulmonary disease, unspecified: Secondary | ICD-10-CM | POA: Diagnosis not present

## 2020-04-09 DIAGNOSIS — Z6825 Body mass index (BMI) 25.0-25.9, adult: Secondary | ICD-10-CM | POA: Diagnosis not present

## 2020-04-09 DIAGNOSIS — M2550 Pain in unspecified joint: Secondary | ICD-10-CM | POA: Diagnosis not present

## 2020-04-09 DIAGNOSIS — E663 Overweight: Secondary | ICD-10-CM | POA: Diagnosis not present

## 2020-04-09 DIAGNOSIS — L409 Psoriasis, unspecified: Secondary | ICD-10-CM | POA: Diagnosis not present

## 2020-04-09 DIAGNOSIS — R5383 Other fatigue: Secondary | ICD-10-CM | POA: Diagnosis not present

## 2020-04-09 DIAGNOSIS — M545 Low back pain, unspecified: Secondary | ICD-10-CM | POA: Diagnosis not present

## 2020-05-06 DIAGNOSIS — L578 Other skin changes due to chronic exposure to nonionizing radiation: Secondary | ICD-10-CM | POA: Diagnosis not present

## 2020-05-06 DIAGNOSIS — D2262 Melanocytic nevi of left upper limb, including shoulder: Secondary | ICD-10-CM | POA: Diagnosis not present

## 2020-05-06 DIAGNOSIS — Z86018 Personal history of other benign neoplasm: Secondary | ICD-10-CM | POA: Diagnosis not present

## 2020-05-06 DIAGNOSIS — D2272 Melanocytic nevi of left lower limb, including hip: Secondary | ICD-10-CM | POA: Diagnosis not present

## 2020-05-06 DIAGNOSIS — L821 Other seborrheic keratosis: Secondary | ICD-10-CM | POA: Diagnosis not present

## 2020-05-06 DIAGNOSIS — D0362 Melanoma in situ of left upper limb, including shoulder: Secondary | ICD-10-CM | POA: Diagnosis not present

## 2020-05-06 DIAGNOSIS — L570 Actinic keratosis: Secondary | ICD-10-CM | POA: Diagnosis not present

## 2020-05-06 DIAGNOSIS — D225 Melanocytic nevi of trunk: Secondary | ICD-10-CM | POA: Diagnosis not present

## 2020-05-06 DIAGNOSIS — Z85828 Personal history of other malignant neoplasm of skin: Secondary | ICD-10-CM | POA: Diagnosis not present

## 2020-05-06 DIAGNOSIS — D2261 Melanocytic nevi of right upper limb, including shoulder: Secondary | ICD-10-CM | POA: Diagnosis not present

## 2020-05-06 DIAGNOSIS — D485 Neoplasm of uncertain behavior of skin: Secondary | ICD-10-CM | POA: Diagnosis not present

## 2020-05-06 DIAGNOSIS — Z808 Family history of malignant neoplasm of other organs or systems: Secondary | ICD-10-CM | POA: Diagnosis not present

## 2020-05-18 DIAGNOSIS — D0362 Melanoma in situ of left upper limb, including shoulder: Secondary | ICD-10-CM | POA: Diagnosis not present

## 2020-05-21 ENCOUNTER — Telehealth: Payer: Self-pay

## 2020-05-21 NOTE — Telephone Encounter (Signed)
[name] - Client TELEPHONE ADVICE RECORD [facility] Patient Name: Pamela Morales [facility] Gender: Female DOB: 1940/10/24 Age: 80 Y 24 D Return Phone Number: [phone] (Primary) Address: [place] [address] Client [facility] - Client Client Site [place] MD Contact Type Call Who Is Calling Patient / Member / Family / Caregiver Call Type Triage / Clinical Relationship To Patient Self Return Phone Number [phone] (Primary) Chief Complaint Fever (non urgent symptom) (> THREE MONTHS) Reason for Call Symptomatic / Request for Health Information Initial Comment Office is transferring a pt with presumed sinus infection and possible fever. No appts until MON. Translation No Nurse Assessment Nurse: Velta Addison, RN, Crystal Date/Time (Eastern Time): 05/21/2020 10:23:56 AM Confirm and document reason for call. If symptomatic, describe symptoms. ---Presumed sinus infection and possible fever. Caller states she does not have thermometer but has sweating at night. States she has been having runny nose and post nasal drip which is causing throat to be sore. Started about 3 days ago, thought it was allergies but feels it only helps runny nose. States her covid test was negative yesterday at home. Does the patient have any new or worsening symptoms? ---Yes Will a triage be completed? ---Yes Related visit to physician within the last 2 weeks? ---Yes Does the PT have any chronic conditions? (i.e. diabetes, asthma, this includes High risk factors for pregnancy, etc.) ---Yes List chronic conditions. ---Melanoma, HTN Is this a behavioral health or substance abuse call? ---No Guidelines Guideline Title Affirmed Question Affirmed Notes Nurse Date/Time Eilene Ghazi Time) Sinus Pain or Congestion [1] Sinus congestion as part of a cold AND [2] present < 10  days Parrott, RN, Crystal 05/21/2020 10:26:24 AM Disp. Time Eilene Ghazi Time) Disposition Final User PLEASE NOTE: All timestamps contained within this report are represented as [place] Standard Time. CONFIDENTIALTY NOTICE: This fax transmission is intended only for the addressee. It contains information that is legally privileged, confidential or otherwise protected from use or disclosure. If you are not the intended recipient, you are strictly prohibited from reviewing, disclosing, copying using or disseminating any of this information or taking any action in reliance on or regarding this information. If you have received this fax in error, please notify us immediately by telephone so that we can arrange for its return to [place]. Phone: [phone], Toll-Free: [phone], Fax: [phone] Page: 2 of 2 Call Id: [id number] 05/21/2020 10:36:11 AM Home Care Yes Parrott, RN, Crystal Caller Disagree/Comply Comply Caller Understands Yes PreDisposition Call Doctor Care Advice Given Per Guideline HOME CARE: * You should be able to treat this at home. REASSURANCE AND EDUCATION - COLDS AND SINUS CONGESTION: * Sinus congestion is a normal part of a cold. * Usually home treatment with nasal washes can prevent an actual bacterial sinus infection. * Antibiotics are not helpful for the sinus congestion that occurs with colds. * Here is some care advice that should help. PAIN MEDICINES: * For pain relief, you can take either acetaminophen, ibuprofen, or naproxen. * [place] that makes Aleve, has different dosage instructions for Aleve in [place] and the [place]. In [place], the maximum recommended dose per day is 440 mg (2 pills or caplets). In the [place], the maximum dose per day is 660 mg (3 pills or caplets). CARE ADVICE given per Sinus Pain or Congestion (Adult) guideline. * You become worse CALL BACK IF: * Sinus pain persists over 1 day  after using nasal washes Comments User: Hamilton Capri, RN Date/Time Eilene Ghazi Time): 05/21/2020 10:27:20 AM Has COPD/emphysema User: Hamilton Capri, RN Date/Time Eilene Ghazi Time): 05/21/2020 10:31:18 AM States she is taking Cetirizine/HCL User: Hamilton Capri, RN Date/Time (Eastern Time): 05/21/2020 10:38:19 AM Caller states she has been taking antihistamine only. She has aleve at home and due to her high blood pressure advised her to get something that will not effect her bp like Coricidin HBP and pick up a thermometer. Advised to call back if needs further assistance or if these are not effective. Verbalized understanding.

## 2020-05-21 NOTE — Telephone Encounter (Signed)
I spoke with Pamela Morales; I offered Pamela Morales a virtual appt with another provider but Pamela Morales said she wanted to try the advice given by access nurse and if needed Pamela Morales will cb for appt. Sending note to Dr Glori Bickers as Juluis Rainier.

## 2020-05-22 NOTE — Telephone Encounter (Signed)
Pt called to schedule an appt as she reports she feels that she now has chest congestion... I scheduled pt for phone visit for Monday per her request, but pt would like to come in office.... Please advise if appropriate

## 2020-05-22 NOTE — Telephone Encounter (Signed)
Left VM requesting pt to call the office back 

## 2020-05-22 NOTE — Telephone Encounter (Signed)
It has to be virtual to start (with those symptoms)  I do recommend she repeat the covid test also - sometimes they take a bit of time to turn positive after symptoms started UC is also an option

## 2020-05-25 ENCOUNTER — Other Ambulatory Visit: Payer: Self-pay

## 2020-05-25 ENCOUNTER — Encounter: Payer: Self-pay | Admitting: Family Medicine

## 2020-05-25 ENCOUNTER — Telehealth (INDEPENDENT_AMBULATORY_CARE_PROVIDER_SITE_OTHER): Payer: PPO | Admitting: Family Medicine

## 2020-05-25 DIAGNOSIS — J209 Acute bronchitis, unspecified: Secondary | ICD-10-CM | POA: Diagnosis not present

## 2020-05-25 DIAGNOSIS — J441 Chronic obstructive pulmonary disease with (acute) exacerbation: Secondary | ICD-10-CM

## 2020-05-25 DIAGNOSIS — J069 Acute upper respiratory infection, unspecified: Secondary | ICD-10-CM

## 2020-05-25 MED ORDER — PREDNISONE 10 MG PO TABS: ORAL_TABLET | ORAL | 0 refills | Status: DC

## 2020-05-25 MED ORDER — ALBUTEROL SULFATE HFA 108 (90 BASE) MCG/ACT IN AERS: 2.0000 | INHALATION_SPRAY | RESPIRATORY_TRACT | 3 refills | Status: DC | PRN

## 2020-05-25 NOTE — Patient Instructions (Signed)
Drink fluids and rest  Watch temperature  Do another covid test and let us know if it comes back positive Take the prednisone as directed Use albuterol inhaler as needed for wheezing Over the counter expectorant with DM may help cough  Update if not starting to improve in a week or if worsening  If short of breath-go to the ER If no improvement we will want to consider a chest xray

## 2020-05-25 NOTE — Assessment & Plan Note (Signed)
7 days of uri symptoms with increasing cough and wheezing Negative home covid test and pt is immunized with booster  No fever and phlegm is clear  Enc to test for covid one more time  Fluids/rest otc expectorant DM if needed  Albuterol mdi sent to pharmacy for prn use Prednisone for bronchitis/wheezing and close observation  Low threshold for cxr if no imp  Update if not starting to improve in a week or if worsening

## 2020-05-25 NOTE — Telephone Encounter (Signed)
Pt notified and is okay with virtual visit this afternoon

## 2020-05-25 NOTE — Assessment & Plan Note (Signed)
Recently well controlled w/o mt medicine until this viral illness  Prednisone and albuterol px

## 2020-05-25 NOTE — Progress Notes (Signed)
Virtual Visit via Video Note  I connected with Pamela Morales on 05/25/20 at  3:00 PM EDT by a video enabled telemedicine application and verified that I am speaking with the correct person using two identifiers.  Location: Patient: home Provider: office   I discussed the limitations of evaluation and management by telemedicine and the availability of in person appointments. The patient expressed understanding and agreed to proceed.  Parties involved in encounter  Patient: Pamela Morales  Provider:  Loura Pardon MD   Pt is unable to do video for visit today so it was conducted by phone   History of Present Illness: Pt presents for uri symptoms including cough and headache and nasal congestion  She has h/o copd in the past (used symbicort in the past and no longer does)   Started last Monday and had runny nose and ST  (had melanoma removed)  Took allergy med  Went into her chest   No sick exposures known   Cough- mostly dry (a ittle mucous- thick without much color) Some wheezing  Has not used an inhaler   Nose runs a lot -clear musous   Headache is not sinus pain  Ears feel plugged   No temp  97.5 today  Had chills once at night -last wed   Neg covid test on wed  Otc:  chlorcedin [place]  No nasal sprays    Feels generally weak   She is covid immunized with booster   Has psoriatic arthritis and takes cosentyx   Lab Results  Component Value Date   CREATININE 1.17 07/16/2019   BUN 26 (H) 07/16/2019   NA 138 07/16/2019   K 4.8 07/16/2019   CL 104 07/16/2019   CO2 29 07/16/2019  GFR 44.4 last summer     Patient Active Problem List   Diagnosis Date Noted  . Acute bronchitis 05/25/2020  . Left foot pain 01/27/2020  . High triglycerides 07/16/2019  . Laceration of right knee 12/10/2018  . COPD with chronic bronchitis and emphysema ([place]) 07/12/2018  . Elevated TSH 10/18/2016  . Elevated serum creatinine 10/18/2016  . Estrogen deficiency 03/23/2016  .  Encounter for screening mammogram for breast cancer 03/23/2016  . Psoriatic arthritis ([place]) 03/31/2015  . Routine general medical examination at a health care facility 03/25/2015  . Heartburn 03/03/2015  . Wheezing 01/09/2015  . Fatigue 04/28/2014  . Family history of colon cancer 01/01/2013  . Personal history of colonic polyps 01/01/2013  . Colon cancer screening 12/19/2012  . Hearing loss 09/19/2012  . 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 Colon polyps 07/16/2010  . BACK PAIN WITH RADICULOPATHY 03/15/2007  . History of herpes zoster 02/23/2007  . Essential hypertension 02/05/2007  . Osteopenia 02/05/2007  . ECZEMA, ATOPIC 01/29/2007  . ROSACEA 01/29/2007  . BASAL CELL CARCINOMA, HX OF 01/29/2007   Past Medical History:  Diagnosis Date  . Adenomatous polyp of colon   . Allergy   . Anemia    PAST HX   . CA - cancer of ovary   . Cancer ([place]) 02/10   R breast  (est rec neg and brac neg)  . Cataract    FORMING   . COPD (chronic obstructive pulmonary disease) ([place])   . DDD (degenerative disc disease)    in back with chronic pain  . Depression   . Diverticulosis   . Eczema   . Hypertension   . Osteopenia   . Rosacea   . Zoster 02/09  Past Surgical History:  Procedure Laterality Date  . ABDOMINAL HYSTERECTOMY  1996   BSO fibroids, incidental ovarian CA finding  . BASAL CELL CARCINOMA EXCISION  09/2016  . bladder tack    . BREAST SURGERY     rt total mastectomy for breast CA  . CHOLECYSTECTOMY  08/1996   10 yrs ago  . COLONOSCOPY    . lipoma removal  11/07  . POLYPECTOMY     Social History   Tobacco Use  . Smoking status: Former Smoker    Quit date: 01/03/1990    Years since quitting: 30.4  . Smokeless tobacco: Never Used  Vaping Use  . Vaping Use: Never used  Substance Use Topics  . Alcohol use: Yes    Alcohol/week: 7.0 standard drinks    Types: 5 Glasses of wine, 2 Standard drinks or equivalent per week  . Drug use: No   Family History   Problem Relation Age of Onset  . Cancer Mother        stomach  . Hypertension Mother   . Heart disease Mother        CAD  . Stomach cancer Mother   . Cancer Father        lung  . Cancer Sister        colon, skin  . Heart disease Sister 8       MI  . Diabetes Sister   . Colon cancer Sister 61  . Heart disease Brother        HTN and MI  . Hypertension Brother   . Colon cancer Daughter 19  . Esophageal cancer Neg Hx   . Colon polyps Neg Hx    Allergies  Allergen Reactions  . Sulfa Antibiotics    Current Outpatient Medications on File Prior to Visit  Medication Sig Dispense Refill  . amLODipine (NORVASC) 10 MG tablet Take 1 tablet (10 mg total) by mouth daily. 90 tablet 3  . Calcium Carbonate-Vitamin D (CALTRATE 600+D PO) Take 1,200 mg by mouth every evening.     . Cyanocobalamin (B-12 PO) Take 5,000 mg by mouth once a week.     . Doxylamine Succinate, Sleep, (UNISOM PO) Take 50 mg by mouth at bedtime.    . Ferrous Sulfate (IRON) 325 (65 Fe) MG TABS Take 1 tablet by mouth daily.    Marland Kitchen FLUoxetine (PROZAC) 20 MG capsule Take 2 capsules (40 mg total) by mouth daily. 180 capsule 3  . losartan (COZAAR) 50 MG tablet Take 1 tablet (50 mg total) by mouth daily. 90 tablet 3  . MAGNESIUM PO Take 500 mg by mouth daily as needed (only takes when working out, for muscle cramps).     . MELATONIN PO Take 10 mg by mouth at bedtime.    . Multiple Vitamin (MULTIVITAMIN) tablet Take 1 tablet by mouth daily.    . Multiple Vitamins-Minerals (PRESERVISION AREDS 2 PO) Take 1 capsule by mouth daily.    . Secukinumab, 300 MG Dose, (COSENTYX, 300 MG DOSE,) 150 MG/ML SOSY Inject 1 application as directed every 30 (thirty) days.    . TURMERIC CURCUMIN PO Take 500 mg by mouth daily.    . budesonide-formoterol (SYMBICORT) 80-4.5 MCG/ACT inhaler Inhale 2 puffs into the lungs 2 (two) times daily. (Patient not taking: Reported on 05/25/2020) 1 Inhaler 0   No current facility-administered medications on file  prior to visit.   Review of Systems  Constitutional: Negative for chills, fever and malaise/fatigue.  HENT: Positive for congestion. Negative  for ear pain, sinus pain and sore throat.   Eyes: Negative for blurred vision, discharge and redness.  Respiratory: Positive for cough, sputum production and wheezing. Negative for shortness of breath and stridor.   Cardiovascular: Negative for chest pain, palpitations and leg swelling.  Gastrointestinal: Negative for abdominal pain, diarrhea, nausea and vomiting.  Musculoskeletal: Negative for myalgias.  Skin: Negative for rash.  Neurological: Positive for headaches. Negative for dizziness.    Observations/Objective: Pt sounds well Not distressed  Voice is not hoarse  Few dry coughs  Some wheezing noted when coming back to the phone after walking down the hall Not short of breath  Good historian , nl cognition  Mood is cheerful   Assessment and Plan: Problem List Items Addressed This Visit      Respiratory   COPD (chronic obstructive pulmonary disease) ([place])    Recently well controlled w/o mt medicine until this viral illness  Prednisone and albuterol px      Relevant Medications   predniSONE (DELTASONE) 10 MG tablet   albuterol (VENTOLIN HFA) 108 (90 Base) MCG/ACT inhaler   Acute bronchitis    7 days of uri symptoms with increasing cough and wheezing Negative home covid test and pt is immunized with booster  No fever and phlegm is clear  Enc to test for covid one more time  Fluids/rest otc expectorant DM if needed  Albuterol mdi sent to pharmacy for prn use Prednisone for bronchitis/wheezing and close observation  Low threshold for cxr if no imp  Update if not starting to improve in a week or if worsening       RESOLVED: Viral URI with cough               Follow Up Instructions: Drink fluids and rest  Watch temperature  Do another covid test and let us know if it comes back positive Take the prednisone as  directed Use albuterol inhaler as needed for wheezing Over the counter expectorant with DM may help cough  Update if not starting to improve in a week or if worsening  If short of breath-go to the ER If no improvement we will want to consider a chest xray    I discussed the assessment and treatment plan with the patient. The patient was provided an opportunity to ask questions and all were answered. The patient agreed with the plan and demonstrated an understanding of the instructions.   The patient was advised to call back or seek an in-person evaluation if the symptoms worsen or if the condition fails to improve as anticipated.  I provided 17 minutes of non-face-to-face time during this encounter.   Loura Pardon, MD

## 2020-06-11 ENCOUNTER — Telehealth: Payer: Self-pay | Admitting: Family Medicine

## 2020-06-11 NOTE — Chronic Care Management (AMB) (Signed)
  Chronic Care Management   Note  06/11/2020 Name: Pamela Morales MRN: [id number] DOB: 1940-08-10  Pamela Morales is a 80 y.o. year old female who is a primary care patient of Tower, Wynelle Fanny, MD. I reached out to Aurther Loft by phone today in response to a referral sent by Pamela Morales's PCP, Tower, Wynelle Fanny, MD.   Pamela Morales was given information about Chronic Care Management services today including:  CCM service includes personalized support from designated clinical staff supervised by her physician, including individualized plan of care and coordination with other care providers 24/7 contact phone numbers for assistance for urgent and routine care needs. Service will only be billed when office clinical staff spend 20 minutes or more in a month to coordinate care. Only one practitioner may furnish and bill the service in a calendar month. The patient may stop CCM services at any time (effective at the end of the month) by phone call to the office staff.   Patient agreed to services and verbal consent obtained.   Follow up plan:   Tatjana Secretary/administrator

## 2020-06-18 ENCOUNTER — Ambulatory Visit (INDEPENDENT_AMBULATORY_CARE_PROVIDER_SITE_OTHER)
Admission: RE | Admit: 2020-06-18 | Discharge: 2020-06-18 | Disposition: A | Discharge status: Home / Self Care | Payer: PPO | Source: Ambulatory Visit | Attending: Family Medicine | Admitting: Family Medicine

## 2020-06-18 ENCOUNTER — Encounter: Payer: Self-pay | Admitting: Family Medicine

## 2020-06-18 ENCOUNTER — Ambulatory Visit (INDEPENDENT_AMBULATORY_CARE_PROVIDER_SITE_OTHER): Payer: PPO | Admitting: Family Medicine

## 2020-06-18 ENCOUNTER — Other Ambulatory Visit: Payer: Self-pay

## 2020-06-18 VITALS — BP 138/76 | HR 51 | Temp 97.6°F | Ht 60.0 in | Wt 135.0 lb

## 2020-06-18 DIAGNOSIS — J209 Acute bronchitis, unspecified: Secondary | ICD-10-CM

## 2020-06-18 DIAGNOSIS — J441 Chronic obstructive pulmonary disease with (acute) exacerbation: Secondary | ICD-10-CM | POA: Diagnosis not present

## 2020-06-18 DIAGNOSIS — R5382 Chronic fatigue, unspecified: Secondary | ICD-10-CM | POA: Diagnosis not present

## 2020-06-18 DIAGNOSIS — R7989 Other specified abnormal findings of blood chemistry: Secondary | ICD-10-CM | POA: Diagnosis not present

## 2020-06-18 DIAGNOSIS — R059 Cough, unspecified: Secondary | ICD-10-CM | POA: Diagnosis not present

## 2020-06-18 LAB — COMPREHENSIVE METABOLIC PANEL
ALT: 12 U/L (ref 0–35)
AST: 19 U/L (ref 0–37)
Albumin: 4.1 g/dL (ref 3.5–5.2)
Alkaline Phosphatase: 65 U/L (ref 39–117)
BUN: 29 mg/dL — ABNORMAL HIGH (ref 6–23)
CO2: 30 mEq/L (ref 19–32)
Calcium: 9.9 mg/dL (ref 8.4–10.5)
Chloride: 104 mEq/L (ref 96–112)
Creatinine, Ser: 1.29 mg/dL — ABNORMAL HIGH (ref 0.40–1.20)
GFR: 39.02 mL/min — ABNORMAL LOW (ref >60.00)
Glucose, Bld: 90 mg/dL (ref 70–99)
Potassium: 4 mEq/L (ref 3.5–5.1)
Sodium: 141 mEq/L (ref 135–145)
Total Bilirubin: 0.4 mg/dL (ref 0.2–1.2)
Total Protein: 6.9 g/dL (ref 6.0–8.3)

## 2020-06-18 LAB — CBC WITH DIFFERENTIAL/PLATELET
Basophils Absolute: 0.1 10*3/uL (ref 0.0–0.1)
Basophils Relative: 0.7 % (ref 0.0–3.0)
Eosinophils Absolute: 0.2 10*3/uL (ref 0.0–0.7)
Eosinophils Relative: 2 % (ref 0.0–5.0)
HCT: 35 % — ABNORMAL LOW (ref 36.0–46.0)
Hemoglobin: 11.8 g/dL — ABNORMAL LOW (ref 12.0–15.0)
Lymphocytes Relative: 28.4 % (ref 12.0–46.0)
Lymphs Abs: 2.6 10*3/uL (ref 0.7–4.0)
MCHC: 33.9 g/dL (ref 30.0–36.0)
MCV: 82.3 fl (ref 78.0–100.0)
Monocytes Absolute: 0.6 10*3/uL (ref 0.1–1.0)
Monocytes Relative: 6.6 % (ref 3.0–12.0)
Neutro Abs: 5.6 10*3/uL (ref 1.4–7.7)
Neutrophils Relative %: 62.3 % (ref 43.0–77.0)
Platelets: 235 10*3/uL (ref 150.0–400.0)
RBC: 4.25 Mil/uL (ref 3.87–5.11)
RDW: 13.4 % (ref 11.5–15.5)
WBC: 9 10*3/uL (ref 4.0–10.5)

## 2020-06-18 LAB — IRON: Iron: 73 ug/dL (ref 42–145)

## 2020-06-18 LAB — FERRITIN: Ferritin: 247.4 ng/mL (ref 10.0–291.0)

## 2020-06-18 LAB — T4, FREE: Free T4: 0.76 ng/dL (ref 0.60–1.60)

## 2020-06-18 LAB — TSH: TSH: 4.42 u[IU]/mL (ref 0.35–4.50)

## 2020-06-18 NOTE — Assessment & Plan Note (Signed)
Much clinical improvement but some lingering cough and fatigue  cxr ordered Labs ordered  Enc good fluid intake and balanced diet with adequate rest

## 2020-06-18 NOTE — Progress Notes (Signed)
Subjective:    Patient ID: Pamela Morales, female    DOB: 03/30/1940, 80 y.o.   MRN: [id number]  This visit occurred during the SARS-CoV-2 public health emergency.  Safety protocols were in place, including screening questions prior to the visit, additional usage of staff PPE, and extensive cleaning of exam room while observing appropriate contact time as indicated for disinfecting solutions.   HPI Pt presents for c/o fatigue  Wt Readings from Last 3 Encounters:  06/18/20 135 lb (61.2 kg)  01/27/20 135 lb 9 oz (61.5 kg)  07/16/19 138 lb 4 oz (62.7 kg)   26.37 kg/m  Presents with fatigue Ever since she had bronchitis (reviewed notes from this) Too tired and sluggish to go to the gym   Took prednisone  Breathing is still not 100% No pain to take deep breath  A little cough No production  No chest pain  No headaches  Drinks fluids   No facial pain  Clear nasal mucous  No GI symptoms  Appetite is ok   Some wheezing  Symbicort Needs albuterol when she exerts hersself   BP Readings from Last 3 Encounters:  06/18/20 138/76  01/27/20 110/66  07/16/19 136/70   Patient Active Problem List   Diagnosis Date Noted   Acute bronchitis 05/25/2020   Left foot pain 01/27/2020   High triglycerides 07/16/2019   Laceration of right knee 12/10/2018   COPD (chronic obstructive pulmonary disease) ([place]) 07/12/2018   Elevated TSH 10/18/2016   Elevated serum creatinine 10/18/2016   Estrogen deficiency 03/23/2016   Encounter for screening mammogram for breast cancer 03/23/2016   Psoriatic arthritis ([place]) 03/31/2015   Routine general medical examination at a health care facility 03/25/2015   Heartburn 03/03/2015   Wheezing 01/09/2015   Fatigue 04/28/2014   Family history of colon cancer 01/01/2013   Personal history of colonic polyps 01/01/2013   Colon cancer screening 12/19/2012   Hearing loss 09/19/2012   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   Colon polyps  07/16/2010   BACK PAIN WITH RADICULOPATHY 03/15/2007   History of herpes zoster 02/23/2007   Essential hypertension 02/05/2007   Osteopenia 02/05/2007   ECZEMA, ATOPIC 01/29/2007   ROSACEA 01/29/2007   BASAL CELL CARCINOMA, HX OF 01/29/2007   Past Medical History:  Diagnosis Date   Adenomatous polyp of colon    Allergy    Anemia    PAST HX    CA - cancer of ovary    Cancer ([place]) 02/10   R breast  (est rec neg and brac neg)   Cataract    FORMING    COPD (chronic obstructive pulmonary disease) ([place])    DDD (degenerative disc disease)    in back with chronic pain   Depression    Diverticulosis    Eczema    Hypertension    Osteopenia    Rosacea    Zoster 02/09   Past Surgical History:  Procedure Laterality Date   ABDOMINAL HYSTERECTOMY  1996   BSO fibroids, incidental ovarian CA finding   BASAL CELL CARCINOMA EXCISION  09/2016   bladder tack     BREAST SURGERY     rt total mastectomy for breast CA   CHOLECYSTECTOMY  08/1996   10 yrs ago   COLONOSCOPY     lipoma removal  11/07   POLYPECTOMY     Social History   Tobacco Use   Smoking status: Former    Pack years: 0.00    Types:  Cigarettes    Quit date: 01/03/1990    Years since quitting: 30.4   Smokeless tobacco: Never  Vaping Use   Vaping Use: Never used  Substance Use Topics   Alcohol use: Yes    Alcohol/week: 7.0 standard drinks    Types: 5 Glasses of wine, 2 Standard drinks or equivalent per week   Drug use: No   Family History  Problem Relation Age of Onset   Cancer Mother        stomach   Hypertension Mother    Heart disease Mother        CAD   Stomach cancer Mother    Cancer Father        lung   Cancer Sister        colon, skin   Heart disease Sister 71       MI   Diabetes Sister    Colon cancer Sister 24   Heart disease Brother        HTN and MI   Hypertension Brother    Colon cancer Daughter 76   Esophageal cancer Neg Hx    Colon polyps Neg Hx    Allergies  Allergen Reactions    Sulfa Antibiotics    Current Outpatient Medications on File Prior to Visit  Medication Sig Dispense Refill   albuterol (VENTOLIN HFA) 108 (90 Base) MCG/ACT inhaler Inhale 2 puffs into the lungs every 4 (four) hours as needed for wheezing. 1 each 3   amLODipine (NORVASC) 10 MG tablet Take 1 tablet (10 mg total) by mouth daily. 90 tablet 3   budesonide-formoterol (SYMBICORT) 80-4.5 MCG/ACT inhaler Inhale 2 puffs into the lungs 2 (two) times daily. 1 Inhaler 0   Calcium Carbonate-Vitamin D (CALTRATE 600+D PO) Take 1,200 mg by mouth every evening.      Cyanocobalamin (B-12 PO) Take 5,000 mg by mouth once a week. (B-12)     Doxylamine Succinate, Sleep, (UNISOM PO) Take 50 mg by mouth at bedtime.     Ferrous Sulfate (IRON) 325 (65 Fe) MG TABS Take 1 tablet by mouth daily.     FLUoxetine (PROZAC) 20 MG capsule Take 2 capsules (40 mg total) by mouth daily. 180 capsule 3   losartan (COZAAR) 50 MG tablet Take 1 tablet (50 mg total) by mouth daily. 90 tablet 3   MAGNESIUM PO Take 500 mg by mouth daily as needed (only takes when working out, for muscle cramps).      MELATONIN PO Take 10 mg by mouth at bedtime.     Multiple Vitamin (MULTIVITAMIN) tablet Take 1 tablet by mouth daily.     Multiple Vitamins-Minerals (PRESERVISION AREDS 2 PO) Take 1 capsule by mouth daily.     Secukinumab, 300 MG Dose, (COSENTYX, 300 MG DOSE,) 150 MG/ML SOSY Inject 1 application as directed every 30 (thirty) days.     TURMERIC CURCUMIN PO Take 500 mg by mouth daily.     No current facility-administered medications on file prior to visit.    Review of Systems  Constitutional:  Positive for fatigue. Negative for activity change, appetite change, fever and unexpected weight change.  HENT:  Negative for congestion, rhinorrhea, sore throat and trouble swallowing.   Eyes:  Negative for pain, redness, itching and visual disturbance.  Respiratory:  Positive for wheezing. Negative for cough, chest tightness and shortness of breath.    Cardiovascular:  Negative for chest pain and palpitations.  Gastrointestinal:  Negative for abdominal pain, blood in stool, constipation, diarrhea and nausea.  Endocrine: Negative for cold intolerance, heat intolerance, polydipsia and polyuria.  Genitourinary:  Negative for difficulty urinating, dysuria, frequency and urgency.  Musculoskeletal:  Negative for arthralgias, joint swelling and myalgias.  Skin:  Negative for pallor and rash.  Neurological:  Negative for dizziness, tremors, weakness, numbness and headaches.  Hematological:  Negative for adenopathy. Does not bruise/bleed easily.  Psychiatric/Behavioral:  Negative for decreased concentration and dysphoric mood. The patient is not nervous/anxious.       Objective:   Physical Exam Constitutional:      General: She is not in acute distress.    Appearance: Normal appearance. She is well-developed and normal weight. She is not ill-appearing.  HENT:     Head: Normocephalic and atraumatic.  Eyes:     Conjunctiva/sclera: Conjunctivae normal.     Pupils: Pupils are equal, round, and reactive to light.  Neck:     Thyroid: No thyromegaly.     Vascular: No carotid bruit or JVD.  Cardiovascular:     Rate and Rhythm: Normal rate and regular rhythm.     Heart sounds: Normal heart sounds.    No gallop.  Pulmonary:     Effort: Pulmonary effort is normal. No respiratory distress.     Breath sounds: Normal breath sounds. No wheezing or rales.     Comments: Diffusely distant bs No rales or crackles Abdominal:     General: Bowel sounds are normal. There is no distension or abdominal bruit.     Palpations: Abdomen is soft. There is no mass.     Tenderness: There is no abdominal tenderness.  Musculoskeletal:     Cervical back: Normal range of motion and neck supple.     Right lower leg: No edema.     Left lower leg: No edema.  Lymphadenopathy:     Cervical: No cervical adenopathy.  Skin:    General: Skin is warm and dry.      Coloration: Skin is not pale.     Findings: No erythema or rash.  Neurological:     Mental Status: She is alert.     Coordination: Coordination normal.     Deep Tendon Reflexes: Reflexes are normal and symmetric. Reflexes normal.  Psychiatric:        Mood and Affect: Mood normal.          Assessment & Plan:   Problem List Items Addressed This Visit       Respiratory   COPD (chronic obstructive pulmonary disease) ([place])    Continues symbicort and pulmonary f/u  Recovering from bronchitis        Acute bronchitis    Much clinical improvement but some lingering cough and fatigue  cxr ordered Labs ordered  Enc good fluid intake and balanced diet with adequate rest         Relevant Orders   DG Chest 2 View     Other   Fatigue - Primary    Worse since a recent episode of bronchitis tx wit prednisone  Reassuring exam  cxr and labs ordered       Relevant Orders   CBC with Differential/Platelet   Comprehensive metabolic panel   TSH   Iron   Ferritin   Elevated TSH    More fatigue  TSH and FT4 ordered       Relevant Orders   TSH   T4, free

## 2020-06-18 NOTE — Assessment & Plan Note (Signed)
Worse since a recent episode of bronchitis tx wit prednisone  Reassuring exam  cxr and labs ordered

## 2020-06-18 NOTE — Patient Instructions (Signed)
Drink fluids Rest when you need to   Alert [place] if any new symptoms  Chest xray now  Labs now

## 2020-06-18 NOTE — Assessment & Plan Note (Signed)
Continues symbicort and pulmonary f/u  Recovering from bronchitis

## 2020-06-18 NOTE — Assessment & Plan Note (Signed)
More fatigue  TSH and FT4 ordered

## 2020-06-24 ENCOUNTER — Telehealth: Payer: Self-pay | Admitting: Family Medicine

## 2020-06-24 ENCOUNTER — Other Ambulatory Visit (INDEPENDENT_AMBULATORY_CARE_PROVIDER_SITE_OTHER): Payer: PPO

## 2020-06-24 DIAGNOSIS — R350 Frequency of micturition: Secondary | ICD-10-CM

## 2020-06-24 DIAGNOSIS — R7989 Other specified abnormal findings of blood chemistry: Secondary | ICD-10-CM

## 2020-06-24 LAB — POCT URINALYSIS DIP (MANUAL ENTRY)
Bilirubin, UA: NEGATIVE
Blood, UA: NEGATIVE
Glucose, UA: NEGATIVE mg/dL
Ketones, POC UA: NEGATIVE mg/dL
Leukocytes, UA: NEGATIVE
Nitrite, UA: NEGATIVE
Protein Ur, POC: NEGATIVE mg/dL
Spec Grav, UA: 1.02 (ref 1.010–1.025)
Urobilinogen, UA: 0.2 E.U./dL
pH, UA: 6 (ref 5.0–8.0)

## 2020-06-24 NOTE — Telephone Encounter (Signed)
Ua please for elevated cr

## 2020-07-13 ENCOUNTER — Telehealth: Payer: Self-pay | Admitting: Family Medicine

## 2020-07-13 DIAGNOSIS — R7989 Other specified abnormal findings of blood chemistry: Secondary | ICD-10-CM

## 2020-07-13 NOTE — Telephone Encounter (Signed)
-----   Message from Ellamae Sia sent at 06/29/2020 11:01 AM EDT ----- Regarding: Lab orders for Tuesday, 7.12.22 Lab orders for f/u

## 2020-07-14 ENCOUNTER — Other Ambulatory Visit (INDEPENDENT_AMBULATORY_CARE_PROVIDER_SITE_OTHER): Payer: PPO

## 2020-07-14 ENCOUNTER — Other Ambulatory Visit: Payer: Self-pay

## 2020-07-14 DIAGNOSIS — R7989 Other specified abnormal findings of blood chemistry: Secondary | ICD-10-CM | POA: Diagnosis not present

## 2020-07-14 LAB — BASIC METABOLIC PANEL
BUN: 22 mg/dL (ref 6–23)
CO2: 28 mEq/L (ref 19–32)
Calcium: 9.6 mg/dL (ref 8.4–10.5)
Chloride: 102 mEq/L (ref 96–112)
Creatinine, Ser: 1.26 mg/dL — ABNORMAL HIGH (ref 0.40–1.20)
GFR: 40.12 mL/min — ABNORMAL LOW (ref >60.00)
Glucose, Bld: 91 mg/dL (ref 70–99)
Potassium: 4.2 mEq/L (ref 3.5–5.1)
Sodium: 137 mEq/L (ref 135–145)

## 2020-07-17 ENCOUNTER — Telehealth: Payer: Self-pay | Admitting: Family Medicine

## 2020-07-17 NOTE — Telephone Encounter (Signed)
Pt calling back to get lab results

## 2020-07-21 NOTE — Telephone Encounter (Signed)
Addressed through result notes  

## 2020-08-03 ENCOUNTER — Other Ambulatory Visit: Payer: Self-pay

## 2020-08-03 ENCOUNTER — Ambulatory Visit (INDEPENDENT_AMBULATORY_CARE_PROVIDER_SITE_OTHER): Payer: PPO

## 2020-08-03 DIAGNOSIS — I10 Essential (primary) hypertension: Secondary | ICD-10-CM

## 2020-08-03 DIAGNOSIS — J441 Chronic obstructive pulmonary disease with (acute) exacerbation: Secondary | ICD-10-CM | POA: Diagnosis not present

## 2020-08-03 NOTE — Patient Instructions (Signed)
August 03, 2020  Dear Pamela Morales,  It was a pleasure meeting you during our initial appointment on August 03, 2020. Below is a summary of the goals we discussed and components of chronic care management. Please contact me anytime with questions or concerns.   Visit Information   Patient Care Plan: CCM Pharmacy Care Plan     Problem Identified: CCM Pharmacy Care Plan   Priority: High  Onset Date: 08/03/2020  Note:    Current Barriers:  None identified  Pharmacist Clinical Goal(s):  Patient will contact provider office for questions/concerns as evidenced notation of same in electronic health record through collaboration with PharmD and provider.   Interventions: 1:1 collaboration with Tower, Wynelle Fanny, MD regarding development and update of comprehensive plan of care as evidenced by provider attestation and co-signature Inter-disciplinary care team collaboration (see longitudinal plan of care) Comprehensive medication review performed; medication list updated in electronic medical record  Hypertension  (BP goal <140/90) -Controlled - per clinic readings -PCP is watching kidney function, updating labs in Oct due to Scr elevation -Current treatment: Amlodipine 10 mg - 1 in morning Losartan 50 mg - 1 in bedtime  -Medications previously tried: none reported  -Current home readings: none recent, BP monitor broke -Current dietary habits: drinking a lot of water daily -Current exercise habits: very active -Denies hypotensive/hypertensive symptoms -Educated on BP goals and benefits of medications for prevention of heart attack, stroke and kidney damage; -Recommended to continue current medication  COPD (Goal: control symptoms and prevent exacerbations) -Controlled - per patient report -Reports h/o exposure to household cleaning products -She no longer sees pulmonology, doing well   -Bronchitis May 2022 -Current treatment  Symbicort 80-4.5 mcg - Pt takes PRN Albuterol - Pt takes  PRN  -Medications previously tried: none reported  -Exacerbations requiring treatment in last 6 months: none requiring hospitalization, she had bronchitis 05/22.  -Patient denies consistent use of maintenance inhaler -Frequency of rescue inhaler use: infrequent  -Counseled on Benefits of consistent maintenance inhaler use -Recommended to continue current medication  Mood Disorder - No diagnosis inc hart (Goal: Improve mood) -Controlled - per patient report -Current treatment: Fluoxetine 20 mg - 1 capsule daily Unisom 50 mg - 1 tablet at bedtime Melatonin 10 mg - 1 tablet at bedtime -Medications previously tried/failed: none reported -PHQ9: 1 (07/16/19) -GAD7: none -She does not sleep well without the melatonin and Unisom nightly. She denies any adverse effects. -Recommended to continue current medication  Patient Goals/Self-Care Activities Patient will:  - contact CCM team with health concerns  Follow Up Plan: Telephone follow up appointment with care management team member scheduled for:  12 months     Pamela Morales was given information about Chronic Care Management services today including:  CCM service includes personalized support from designated clinical staff supervised by her physician, including individualized plan of care and coordination with other care providers 24/7 contact phone numbers for assistance for urgent and routine care needs. Standard insurance, coinsurance, copays and deductibles apply for chronic care management only during months in which we provide at least 20 minutes of these services. Most insurances cover these services at 100%, however patients may be responsible for any copay, coinsurance and/or deductible if applicable. This service may help you avoid the need for more expensive face-to-face services. Only one practitioner may furnish and bill the service in a calendar month. The patient may stop CCM services at any time (effective at the end of the  month) by phone call to  the office staff.  Patient agreed to services and verbal consent obtained.   Patient verbalizes understanding of instructions provided today and agrees to view in [place].   Debbora Dus, PharmD Clinical Pharmacist [facility] [phone]

## 2020-08-03 NOTE — Progress Notes (Signed)
Chronic Care Management Pharmacy Note  08/03/2020 Name:  Pamela Morales MRN:  [id number] DOB:  [birth year]  Summary: No health or medication concerns identified  Recommendations/Changes made from today's visit: Recommend PCP add diagnosis for fluoxetine  Plan: CCM follow up 12 months or as needed   Subjective: Pamela Morales is an 80 y.o. year old female who is a primary patient of Tower, Wynelle Fanny, MD.  The CCM team was consulted for assistance with disease management and care coordination needs.    Engaged with patient face to face for initial visit in response to provider referral for pharmacy case management and/or care coordination services.   Consent to Services:  The patient was given the following information about Chronic Care Management services today, agreed to services, and gave verbal consent: 1. CCM service includes personalized support from designated clinical staff supervised by the primary care provider, including individualized plan of care and coordination with other care providers 2. 24/7 contact phone numbers for assistance for urgent and routine care needs. 3. Service will only be billed when office clinical staff spend 20 minutes or more in a month to coordinate care. 4. Only one practitioner may furnish and bill the service in a calendar month. 5.The patient may stop CCM services at any time (effective at the end of the month) by phone call to the office staff. 6. The patient will be responsible for cost sharing (co-pay) of up to 20% of the service fee (after annual deductible is met). Patient agreed to services and consent obtained.  Patient Care Team: Tower, Wynelle Fanny, MD as PCP - Patsi Sears, MD as Consulting Physician (Dermatology) Webb Laws, Rafael Gonzalez as Consulting Physician (Optometry) Fanny Skates, MD as Consulting Physician (General Surgery) Debbora Dus, [facility] as Pharmacist (Pharmacist)  Recent office visits: 06/18/20 - Dr. Glori Bickers, PCP - Pt  presented for chronic fatigue. Iron, Ferritin, TSH, T4 Normal. Creatinine is elevated. Increase fluids. Urine is clear. Avoid NSAIDs. Update BMET in 2 weeks.  05/25/20 - Dr. Glori Bickers, PCP - Pt presented for acute bronchitis.   Recent consult visits: None in past 6 months  Hospital visits: None in previous 6 months   Objective:  Lab Results  Component Value Date   CREATININE 1.26 (H) 07/14/2020   BUN 22 07/14/2020   GFR 40.12 (L) 07/14/2020   GFRNONAA 46 (L) 05/07/2008   GFRAA (L) 05/07/2008    56        The eGFR has been calculated using the MDRD equation. This calculation has not been validated in all clinical situations. eGFR's persistently <60 mL/min signify possible Chronic Kidney Disease.   NA 137 07/14/2020   K 4.2 07/14/2020   CALCIUM 9.6 07/14/2020   CO2 28 07/14/2020   GLUCOSE 91 07/14/2020    Lab Results  Component Value Date/Time   GFR 40.12 (L) 07/14/2020 11:58 AM   GFR 39.02 (L) 06/18/2020 11:09 AM     Lab Results  Component Value Date   CHOL 197 07/16/2019   HDL 34.90 (L) 07/16/2019   LDLCALC 112 (H) 07/17/2008   LDLDIRECT 111.0 07/16/2019   TRIG 334.0 (H) 07/16/2019   CHOLHDL 6 07/16/2019    Hepatic Function Latest Ref Rng & Units 06/18/2020 07/16/2019 07/26/2018  Total Protein 6.0 - 8.3 g/dL 6.9 6.7 7.1  Albumin 3.5 - 5.2 g/dL 4.1 4.1 4.1  AST 0 - 37 U/L _0 ALT 0 - 35 U/L _1 Alk Phosphatase 39 - 117 U/L 65  61 65  Total Bilirubin 0.2 - 1.2 mg/dL 0.4 0.3 0.3  Bilirubin, Direct 0.0 - 0.3 mg/dL - - -    Lab Results  Component Value Date/Time   TSH 4.42 06/18/2020 11:09 AM   TSH 3.00 07/16/2019 12:51 PM   FREET4 0.76 06/18/2020 11:09 AM   FREET4 0.83 07/16/2019 12:51 PM    CBC Latest Ref Rng & Units 06/18/2020 07/16/2019 10/12/2016  WBC 4.0 - 10.5 K/uL 9.0 8.8 10.5  Hemoglobin 12.0 - 15.0 g/dL 11.8(L) 12.2 12.0  Hematocrit 36.0 - 46.0 % 35.0(L) 37.0 36.9  Platelets 150.0 - 400.0 K/uL 235.0 219.0 285.0    Lab Results   Component Value Date/Time   VD25OH 51 08/03/2012 12:01 PM   VD25OH 31 07/16/2010 11:54 AM    Clinical ASCVD: No  The ASCVD Risk score Mikey Bussing DC Jr., et al., 2013) failed to calculate for the following reasons:   The 2013 ASCVD risk score is only valid for ages 37 to 29    Depression screen PHQ 2/9 07/16/2019 10/12/2016 03/24/2015  Decreased Interest 0 0 0  Down, Depressed, Hopeless 0 0 0  PHQ - 2 Score 0 0 0  Altered sleeping 0 0 -  Tired, decreased energy 1 0 -  Change in appetite 0 0 -  Feeling bad or failure about yourself  0 0 -  Trouble concentrating 0 0 -  Moving slowly or fidgety/restless 0 0 -  Suicidal thoughts 0 0 -  PHQ-9 Score 1 0 -  Difficult doing work/chores Not difficult at all Not difficult at all -    Social History   Tobacco Use  Smoking Status Former   Types: Cigarettes   Quit date: 01/03/1990   Years since quitting: 30.6  Smokeless Tobacco Never   BP Readings from Last 3 Encounters:  06/18/20 138/76  01/27/20 110/66  07/16/19 136/70   Pulse Readings from Last 3 Encounters:  06/18/20 (!) 51  01/27/20 (!) 56  07/16/19 (!) 56   Wt Readings from Last 3 Encounters:  06/18/20 135 lb (61.2 kg)  01/27/20 135 lb 9 oz (61.5 kg)  07/16/19 138 lb 4 oz (62.7 kg)   BMI Readings from Last 3 Encounters:  06/18/20 26.37 kg/m  01/27/20 26.48 kg/m  07/16/19 27.00 kg/m    Assessment/Interventions: Review of patient past medical history, allergies, medications, health status, including review of consultants reports, laboratory and other test data, was performed as part of comprehensive evaluation and provision of chronic care management services.   SDOH:  (Social Determinants of Health) assessments and interventions performed: Yes SDOH Interventions    Flowsheet Row Most Recent Value  SDOH Interventions   Financial Strain Interventions Intervention Not Indicated      SDOH Screenings   Alcohol Screen: Not on file  Depression (PHQ2-9): Not on file   Financial Resource Strain: Low Risk    Difficulty of Paying Living Expenses: Not very hard  Food Insecurity: Not on file  Housing: Not on file  Physical Activity: Not on file  Social Connections: Not on file  Stress: Not on file  Tobacco Use: Medium Risk   Smoking Tobacco Use: Former   Smokeless Tobacco Use: Never  Transportation Needs: Not on file    [place]  Allergies  Allergen Reactions   Sulfa Antibiotics     Medications Reviewed Today     Reviewed by Debbora Dus, [facility] (Pharmacist) on 08/03/20 at [place] List Status: <None>   Medication Order Taking? Sig Documenting Provider  Last Dose Status Informant  albuterol (VENTOLIN HFA) 108 (90 Base) MCG/ACT inhaler [id number] Yes Inhale 2 puffs into the lungs every 4 (four) hours as needed for wheezing. Tower, Wynelle Fanny, MD Taking Active   amLODipine (NORVASC) 10 MG tablet [id number] Yes Take 1 tablet (10 mg total) by mouth daily. Tower, Wynelle Fanny, MD Taking Active   budesonide-formoterol  [facility]) 80-4.5 MCG/ACT inhaler [id number] Yes Inhale 2 puffs into the lungs 2 (two) times daily. Laverle Hobby, MD Taking Active   Calcium Carbonate-Vitamin D (CALTRATE 600+D PO) [id number] Yes Take 1,200 mg by mouth every evening.  [provider] Taking Active Self  Cyanocobalamin (B-12 PO) [id number] Yes Take 5,000 mg by mouth once a week. (B-12) [provider] Taking Active   Doxylamine Succinate, Sleep, (UNISOM PO) [id number] Yes Take 50 mg by mouth at bedtime. [provider] Taking Active   Ferrous Sulfate (IRON) 325 (65 Fe) MG TABS [id number] Yes Take 1 tablet by mouth daily. [provider] Taking Active   FLUoxetine (PROZAC) 20 MG capsule [id number] Yes Take 2 capsules (40 mg total) by mouth daily. Tower, Wynelle Fanny, MD Taking Active   losartan (COZAAR) 50 MG tablet [id number] Yes Take 1 tablet (50 mg total) by mouth daily. Tower, Wynelle Fanny, MD Taking Active   MAGNESIUM PO [id number] Yes Take 500 mg by  mouth daily as needed (only takes when working out, for muscle cramps).  [provider] Taking Active Self  MELATONIN PO [id number] Yes Take 10 mg by mouth at bedtime. [provider] Taking Active Self  Multiple Vitamin (MULTIVITAMIN) tablet [id number] Yes Take 1 tablet by mouth daily. [provider] Taking Active Self  Multiple Vitamins-Minerals (PRESERVISION AREDS 2 PO) [id number] Yes Take 1 capsule by mouth daily. [provider] Taking Active   Secukinumab, 300 MG Dose, (COSENTYX, 300 MG DOSE,) 150 MG/ML SOSY [id number] Yes Inject 1 application as directed every 30 (thirty) days. [provider] Taking Active   TURMERIC CURCUMIN PO [id number] Yes Take 500 mg by mouth daily. [provider] Taking Active             Patient Active Problem List   Diagnosis Date Noted   Acute bronchitis 05/25/2020   Left foot pain 01/27/2020   High triglycerides 07/16/2019   Laceration of right knee 12/10/2018   COPD (chronic obstructive pulmonary disease) ([place]) 07/12/2018   Elevated TSH 10/18/2016   Elevated serum creatinine 10/18/2016   Estrogen deficiency 03/23/2016   Encounter for screening mammogram for breast cancer 03/23/2016   Psoriatic arthritis ([place]) 03/31/2015   Routine general medical examination at a health care facility 03/25/2015   Heartburn 03/03/2015   Wheezing 01/09/2015   Fatigue 04/28/2014   Family history of colon cancer 01/01/2013   Personal history of colonic polyps 01/01/2013   Colon cancer screening 12/19/2012   Hearing loss 09/19/2012   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   Colon polyps 07/16/2010   BACK PAIN WITH RADICULOPATHY 03/15/2007   History of herpes zoster 02/23/2007   Essential hypertension 02/05/2007   Osteopenia 02/05/2007   ECZEMA, ATOPIC 01/29/2007   ROSACEA 01/29/2007   BASAL CELL CARCINOMA, HX OF 01/29/2007    Immunization History  Administered Date(s) Administered   Fluad  Quad(high Dose 65+) 01/01/2020   Influenza Split 10/11/2011   Influenza Whole 10/04/2006   Influenza, High Dose Seasonal PF 10/12/2016, 10/30/2017, 10/04/2018, 01/01/2020   Influenza,inj,Quad PF,6+ Mos 09/19/2012, 10/31/2013, 10/10/2014   Influenza-Unspecified 09/18/2015   Moderna Sars-Covid-2 Vaccination 01/29/2019,  02/26/2019, 11/23/2019   Pneumococcal Conjugate-13 04/28/2014   Pneumococcal Polysaccharide-23 08/03/2012   Td 01/03/1994, 07/17/2008, 12/10/2018    Conditions to be addressed/monitored:  Hypertension and COPD  Care Plan : [place]  Updates made by Debbora Dus, [facility] since 08/03/2020 12:00 AM     Problem: [place] Plan   Priority: High  Onset Date: 08/03/2020  Note:    Current Barriers:  None identified  Pharmacist Clinical Goal(s):  Patient will contact provider office for questions/concerns as evidenced notation of same in electronic health record through collaboration with PharmD and provider.   Interventions: 1:1 collaboration with Tower, Wynelle Fanny, MD regarding development and update of comprehensive plan of care as evidenced by provider attestation and co-signature Inter-disciplinary care team collaboration (see longitudinal plan of care) Comprehensive medication review performed; medication list updated in electronic medical record  Hypertension  (BP goal <140/90) -Controlled - per clinic readings -PCP is watching kidney function, updating labs in Oct due to Scr elevation -Current treatment: Amlodipine 10 mg - 1 in morning Losartan 50 mg - 1 in bedtime  -Medications previously tried: none reported  -Current home readings: none recent, BP monitor broke -Current dietary habits: drinking a lot of water daily -Current exercise habits: very active -Denies hypotensive/hypertensive symptoms -Educated on BP goals and benefits of medications for prevention of heart attack, stroke and kidney damage; -Recommended to continue current  medication  COPD (Goal: control symptoms and prevent exacerbations) -Controlled - per patient report -Reports h/o exposure to household cleaning products -She no longer sees pulmonology, doing well   -Bronchitis May 2022 -Current treatment  Symbicort 80-4.5 mcg - Pt takes PRN Albuterol - Pt takes PRN  -Medications previously tried: none reported  -Exacerbations requiring treatment in last 6 months: none requiring hospitalization, she had bronchitis 05/22.  -Patient denies consistent use of maintenance inhaler -Frequency of rescue inhaler use: infrequent  -Counseled on Benefits of consistent maintenance inhaler use -Recommended to continue current medication  Mood Disorder - No diagnosis inc hart (Goal: Improve mood) -Controlled - per patient report -Current treatment: Fluoxetine 20 mg - 1 capsule daily Unisom 50 mg - 1 tablet at bedtime Melatonin 10 mg - 1 tablet at bedtime -Medications previously tried/failed: none reported -PHQ9: 1 (07/16/19) -GAD7: none -She does not sleep well without the melatonin and Unisom nightly. She denies any adverse effects. -Recommended to continue current medication  Patient Goals/Self-Care Activities Patient will:  - contact CCM team with health concerns  Follow Up Plan: Telephone follow up appointment with care management team member scheduled for:  12 months    Medication Assistance: None required.  Patient affirms current coverage meets needs.  Compliance/Adherence/Medication fill history: Care Gaps: Discussed below recommendations COVID-19 booster - due Flu - due Shingrix - due  [facility]: Losartan 50 mg - 05/03/20 90 DS  Patient's preferred pharmacy is: [id number] [place] [address] [id number]: [phone]: [phone] Care Plan and Follow Up Patient Decision:  Patient agrees to Care Plan and Follow-up.  MDebbora Dus PharmD Clinical  Pharmacist [phone]

## 2020-08-04 ENCOUNTER — Other Ambulatory Visit: Payer: Self-pay | Admitting: Family Medicine

## 2020-08-05 DIAGNOSIS — J449 Chronic obstructive pulmonary disease, unspecified: Secondary | ICD-10-CM | POA: Diagnosis not present

## 2020-08-05 DIAGNOSIS — Z6824 Body mass index (BMI) 24.0-24.9, adult: Secondary | ICD-10-CM | POA: Diagnosis not present

## 2020-08-05 DIAGNOSIS — M150 Primary generalized (osteo)arthritis: Secondary | ICD-10-CM | POA: Diagnosis not present

## 2020-08-05 DIAGNOSIS — L4050 Arthropathic psoriasis, unspecified: Secondary | ICD-10-CM | POA: Diagnosis not present

## 2020-08-05 DIAGNOSIS — L409 Psoriasis, unspecified: Secondary | ICD-10-CM | POA: Diagnosis not present

## 2020-08-18 ENCOUNTER — Other Ambulatory Visit: Payer: Self-pay | Admitting: Family Medicine

## 2020-08-19 ENCOUNTER — Other Ambulatory Visit: Payer: Self-pay | Admitting: Family Medicine

## 2020-09-19 ENCOUNTER — Ambulatory Visit (INDEPENDENT_AMBULATORY_CARE_PROVIDER_SITE_OTHER): Payer: PPO

## 2020-09-19 ENCOUNTER — Other Ambulatory Visit: Payer: Self-pay

## 2020-09-19 DIAGNOSIS — Z0000 Encounter for general adult medical examination without abnormal findings: Secondary | ICD-10-CM

## 2020-09-19 NOTE — Progress Notes (Signed)
Subjective:   Pamela Morales is a 80 y.o. female who presents for Medicare Annual (Subsequent) preventive examination.  I connected with  Pamela Morales on 09/19/20 by a audio enabled telemedicine application and verified that I am speaking with the correct person using two identifiers.   I discussed the limitations of evaluation and management by telemedicine. The patient expressed understanding and agreed to proceed.  Location of Patient: Home Location of Provider: Office Persons Participating in Visit: [place]   Review of Systems    Defer to PCP       Objective:    There were no vitals filed for this visit. There is no height or weight on file to calculate BMI.  Advanced Directives 09/19/2020 10/12/2016 03/24/2015  Does Patient Have a Medical Advance Directive? Yes No;Yes Yes  Type of Advance Directive Living will [place];Living will [place];Living will  Does patient want to make changes to medical advance directive? Yes (Inpatient - patient requests chaplain consult to change a medical advance directive) - No - Patient declined  Copy of [place] in Chart? - No - copy requested No - copy requested    Current Medications (verified) Outpatient Encounter Medications as of 09/19/2020  Medication Sig   albuterol (VENTOLIN HFA) 108 (90 Base) MCG/ACT inhaler Inhale 2 puffs into the lungs every 4 (four) hours as needed for wheezing.   amLODipine (NORVASC) 10 MG tablet TAKE 1 TABLET BY MOUTH EVERY DAY   budesonide-formoterol (SYMBICORT) 80-4.5 MCG/ACT inhaler Inhale 2 puffs into the lungs 2 (two) times daily.   Calcium Carbonate-Vitamin D (CALTRATE 600+D PO) Take 1,200 mg by mouth every evening.    Cyanocobalamin (B-12 PO) Take 5,000 mg by mouth once a week. (B-12)   Doxylamine Succinate, Sleep, (UNISOM PO) Take 50 mg by mouth at bedtime.   Ferrous Sulfate (IRON) 325 (65 Fe) MG TABS Take 1 tablet  by mouth daily.   FLUoxetine (PROZAC) 20 MG capsule TAKE 2 CAPSULES BY MOUTH EVERY DAY   losartan (COZAAR) 50 MG tablet TAKE 1 TABLET BY MOUTH EVERY DAY   MAGNESIUM PO Take 500 mg by mouth daily as needed (only takes when working out, for muscle cramps).    MELATONIN PO Take 10 mg by mouth at bedtime.   Multiple Vitamin (MULTIVITAMIN) tablet Take 1 tablet by mouth daily.   Multiple Vitamins-Minerals (PRESERVISION AREDS 2 PO) Take 1 capsule by mouth daily.   Secukinumab, 300 MG Dose, (COSENTYX, 300 MG DOSE,) 150 MG/ML SOSY Inject 1 application as directed every 30 (thirty) days.   TURMERIC CURCUMIN PO Take 500 mg by mouth daily.   No facility-administered encounter medications on file as of 09/19/2020.    Allergies (verified) Sulfa antibiotics   History: Past Medical History:  Diagnosis Date   Adenomatous polyp of colon    Allergy    Anemia    PAST HX    CA - cancer of ovary    Cancer ([place]) 02/10   R breast  (est rec neg and brac neg)   Cataract    FORMING    COPD (chronic obstructive pulmonary disease) (HCC)    DDD (degenerative disc disease)    in back with chronic pain   Depression    Diverticulosis    Eczema    Hypertension    Osteopenia    Rosacea    Zoster 02/09   Past Surgical History:  Procedure Laterality Date  ABDOMINAL HYSTERECTOMY  1996   BSO fibroids, incidental ovarian CA finding   BASAL CELL CARCINOMA EXCISION  09/2016   bladder tack     BREAST SURGERY     rt total mastectomy for breast CA   CHOLECYSTECTOMY  08/1996   10 yrs ago   COLONOSCOPY     lipoma removal  11/07   POLYPECTOMY     Family History  Problem Relation Age of Onset   Cancer Mother        stomach   Hypertension Mother    Heart disease Mother        CAD   Stomach cancer Mother    Cancer Father        lung   Cancer Sister        colon, skin   Heart disease Sister 3       MI   Diabetes Sister    Colon cancer Sister 52   Heart disease Brother        HTN and MI    Hypertension Brother    Colon cancer Daughter 5   Esophageal cancer Neg Hx    Colon polyps Neg Hx    Social History   Socioeconomic History   Marital status: Married    Spouse name: Not on file   Number of children: Not on file   Years of education: Not on file   Highest education level: Not on file  Occupational History   Not on file  Tobacco Use   Smoking status: Former    Types: Cigarettes    Quit date: 01/03/1990    Years since quitting: 30.7   Smokeless tobacco: Never  Vaping Use   Vaping Use: Never used  Substance and Sexual Activity   Alcohol use: Yes    Alcohol/week: 7.0 standard drinks    Types: 5 Glasses of wine, 2 Standard drinks or equivalent per week   Drug use: No   Sexual activity: Not Currently  Other Topics Concern   Not on file  Social History Narrative   Not on file   Social Determinants of Health   Financial Resource Strain: Low Risk    Difficulty of Paying Living Expenses: Not hard at all  Food Insecurity: No Food Insecurity   Worried About Charity fundraiser in the Last Year: Never true   Ran Out of Food in the Last Year: Never true  Transportation Needs: No Transportation Needs   Lack of Transportation (Medical): No   Lack of Transportation (Non-Medical): No  Physical Activity: Sufficiently Active   Days of Exercise per Week: 4 days   Minutes of Exercise per Session: 90 min  Stress: No Stress Concern Present   Feeling of Stress : Not at all  Social Connections: Socially Integrated   Frequency of Communication with Friends and Family: More than three times a week   Frequency of Social Gatherings with Friends and Family: Three times a week   Attends Religious Services: More than 4 times per year   Active Member of Clubs or Organizations: Yes   Attends Music therapist: More than 4 times per year   Marital Status: Married    Tobacco Counseling Counseling given: Not Answered   Clinical Intake:  Pre-visit preparation  completed: Yes  Pain : No/denies pain     Diabetes: No  How often do you need to have someone help you when you read instructions, pamphlets, or other written materials from your doctor or pharmacy?: 1 -  Never What is the last grade level you completed in school?: High School  Diabetic?NO  Interpreter Needed?: No      Activities of Daily Living In your present state of health, do you have any difficulty performing the following activities: 09/19/2020 06/18/2020  Hearing? N Y  Vision? N N  Difficulty concentrating or making decisions? N N  Walking or climbing stairs? N N  Dressing or bathing? N N  Doing errands, shopping? N N  Preparing Food and eating ? N -  Using the Toilet? N -  In the past six months, have you accidently leaked urine? N -  Do you have problems with loss of bowel control? N -  Managing your Medications? N -  Managing your Finances? N -  Housekeeping or managing your Housekeeping? N -  Some recent data might be hidden    Patient Care Team: Tower, Wynelle Fanny, MD as PCP - General Jari Pigg, MD as Consulting Physician (Dermatology) Webb Laws, [place] as Consulting Physician (Optometry) Fanny Skates, MD as Consulting Physician (General Surgery) Debbora Dus, [facility] as Pharmacist (Pharmacist)  Indicate any recent Medical Services you may have received from other than [facility] providers in the past year (date may be approximate).     Assessment:   This is a routine wellness examination for [facility].  Hearing/Vision screen No results found.  Dietary issues and exercise activities discussed: Current Exercise Habits: Home exercise routine, Type of exercise: stretching;treadmill;walking, Time (Minutes): > 60, Frequency (Times/Week): 4, Weekly Exercise (Minutes/Week): 0, Intensity: Intense   Goals Addressed   None    Depression Screen PHQ 2/9 Scores 09/19/2020 07/16/2019 10/12/2016 03/24/2015 08/05/2012  PHQ - 2 Score 0 0 0 0 0  PHQ- 9 Score - 1 0 - -     Fall Risk Fall Risk  09/19/2020 06/18/2020 08/02/2018 10/12/2016 03/24/2015  Falls in the past year? 0 0 0 No No  Comment - - Emmi Telephone Survey: data to providers prior to load - -  Number falls in past yr: 0 0 - - -  Injury with Fall? 0 0 - - -  Follow up Falls evaluation completed - - - -    FALL RISK PREVENTION PERTAINING TO THE HOME:  Any stairs in or around the home? No  If so, are there any without handrails? No  Home free of loose throw rugs in walkways, pet beds, electrical cords, etc? No  Adequate lighting in your home to reduce risk of falls? Yes   ASSISTIVE DEVICES UTILIZED TO PREVENT FALLS:  Life alert? No  Use of a cane, walker or w/c? No  Grab bars in the bathroom? No  Shower chair or bench in shower? No  Elevated toilet seat or a handicapped toilet? No   TIMED UP AND GO:  Was the test performed? No .  Length of time to ambulate 10 feet: N/A sec.   Gait steady and fast without use of assistive device  Cognitive Function: MMSE - Mini Mental State Exam 10/12/2016 03/24/2015  Orientation to time 5 5  Orientation to Place 5 5  Registration 3 3  Attention/ Calculation 0 5  Recall 3 1  Language- name 2 objects 0 0  Language- repeat 1 1  Language- follow 3 step command 3 3  Language- read & follow direction 0 1  Write a sentence 0 0  Copy design 0 0  Total score 20 24     6CIT Screen 09/19/2020  What Year? 0 points  What month?  0 points  What time? 0 points  Count back from 20 0 points  Months in reverse 0 points    Immunizations Immunization History  Administered Date(s) Administered   Fluad Quad(high Dose 65+) 01/01/2020   Influenza Split 10/11/2011   Influenza Whole 10/04/2006   Influenza, High Dose Seasonal PF 10/12/2016, 10/30/2017, 10/04/2018, 01/01/2020   Influenza,inj,Quad PF,6+ Mos 09/19/2012, 10/31/2013, 10/10/2014   Influenza-Unspecified 09/18/2015   Moderna Sars-Covid-2 Vaccination 01/29/2019, 02/26/2019, 11/23/2019   Pneumococcal  Conjugate-13 04/28/2014   Pneumococcal Polysaccharide-23 08/03/2012   Td 01/03/1994, 07/17/2008, 12/10/2018    TDAP status: Up to date  Flu Vaccine status: Due, Education has been provided regarding the importance of this vaccine. Advised may receive this vaccine at local pharmacy or Health Dept. Aware to provide a copy of the vaccination record if obtained from local pharmacy or Health Dept. Verbalized acceptance and understanding.  Pneumococcal vaccine status: Due, Education has been provided regarding the importance of this vaccine. Advised may receive this vaccine at local pharmacy or Health Dept. Aware to provide a copy of the vaccination record if obtained from local pharmacy or Health Dept. Verbalized acceptance and understanding.  Covid-19 vaccine status: Completed vaccines  Qualifies for Shingles Vaccine? Yes   Zostavax completed No   Shingrix Completed?: No.    Education has been provided regarding the importance of this vaccine. Patient has been advised to call insurance company to determine out of pocket expense if they have not yet received this vaccine. Advised may also receive vaccine at local pharmacy or Health Dept. Verbalized acceptance and understanding.  Screening Tests Health Maintenance  Topic Date Due   Zoster Vaccines- Shingrix (1 of 2) Never done   COVID-19 Vaccine (4 - Booster for Moderna series) 02/15/2020   INFLUENZA VACCINE  08/03/2020   MAMMOGRAM  10/08/2020   COLONOSCOPY (Pts 45-84yrs Insurance coverage will need to be confirmed)  09/21/2023   TETANUS/TDAP  12/09/2028   DEXA SCAN  Completed   HPV VACCINES  Aged Out    Health Maintenance  Health Maintenance Due  Topic Date Due   Zoster Vaccines- Shingrix (1 of 2) Never done   COVID-19 Vaccine (4 - Booster for Moderna series) 02/15/2020   INFLUENZA VACCINE  08/03/2020    Colorectal cancer screening: Type of screening: Colonoscopy. Completed 09/21/2018. Repeat every 5 years  Mammogram status:  Completed 10/09/2019. Repeat every year 1  Bone Density status: Completed 10/02/2018. Results reflect: Bone density results: NORMAL. Repeat every 10 years.  Lung Cancer Screening: (Low Dose CT Chest recommended if Age 37-80 years, 30 pack-year currently smoking OR have quit w/in 15years.) does qualify.   Lung Cancer Screening Referral: n/a  Additional Screening:  Hepatitis C Screening: does qualify; Completed n/a  Vision Screening: Recommended annual ophthalmology exams for early detection of glaucoma and other disorders of the eye. Is the patient up to date with their annual eye exam?  No  Who is the provider or what is the name of the office in which the patient attends annual eye exams? YES If pt is not established with a provider, would they like to be referred to a provider to establish care? No .   Dental Screening: Recommended annual dental exams for proper oral hygiene  Community Resource Referral / Chronic Care Management: CRR required this visit?  No   CCM required this visit?  No      Plan:     I have personally reviewed and noted the following in the patient's chart:   Medical  and social history Use of alcohol, tobacco or illicit drugs  Current medications and supplements including opioid prescriptions.  Functional ability and status Nutritional status Physical activity Advanced directives List of other physicians Hospitalizations, surgeries, and ER visits in previous 12 months Vitals Screenings to include cognitive, depression, and falls Referrals and appointments  In addition, I have reviewed and discussed with patient certain preventive protocols, quality metrics, and best practice recommendations. A written personalized care plan for preventive services as well as general preventive health recommendations were provided to patient.     Pamela Morales, [facility]   09/19/2020   Nurse Notes: Non Face to Face 30 Minutes  Pamela Morales , Thank you for taking time to  come for your Medicare Wellness Visit. I appreciate your ongoing commitment to your health goals. Please review the following plan we discussed and let me know if I can assist you in the future.   These are the goals we discussed:  Goals      Increase physical activity     Starting 10/12/2016, I will continue to exercise for 60-90 min 6-7 days per week.         This is a list of the screening recommended for you and due dates:  Health Maintenance  Topic Date Due   Zoster (Shingles) Vaccine (1 of 2) Never done   COVID-19 Vaccine (4 - Booster for Moderna series) 02/15/2020   Flu Shot  08/03/2020   Mammogram  10/08/2020   Colon Cancer Screening  09/21/2023   Tetanus Vaccine  12/09/2028   DEXA scan (bone density measurement)  Completed   HPV Vaccine  Aged Out

## 2020-09-23 DIAGNOSIS — H353132 Nonexudative age-related macular degeneration, bilateral, intermediate dry stage: Secondary | ICD-10-CM | POA: Diagnosis not present

## 2020-09-23 DIAGNOSIS — H35342 Macular cyst, hole, or pseudohole, left eye: Secondary | ICD-10-CM | POA: Diagnosis not present

## 2020-09-23 DIAGNOSIS — H35013 Changes in retinal vascular appearance, bilateral: Secondary | ICD-10-CM | POA: Diagnosis not present

## 2020-09-23 DIAGNOSIS — H35371 Puckering of macula, right eye: Secondary | ICD-10-CM | POA: Diagnosis not present

## 2020-10-06 ENCOUNTER — Encounter (INDEPENDENT_AMBULATORY_CARE_PROVIDER_SITE_OTHER): Payer: PPO | Admitting: Ophthalmology

## 2020-10-08 ENCOUNTER — Other Ambulatory Visit: Payer: Self-pay

## 2020-10-08 ENCOUNTER — Encounter (INDEPENDENT_AMBULATORY_CARE_PROVIDER_SITE_OTHER): Payer: PPO | Admitting: Ophthalmology

## 2020-10-08 DIAGNOSIS — H43813 Vitreous degeneration, bilateral: Secondary | ICD-10-CM

## 2020-10-08 DIAGNOSIS — I10 Essential (primary) hypertension: Secondary | ICD-10-CM

## 2020-10-08 DIAGNOSIS — H35033 Hypertensive retinopathy, bilateral: Secondary | ICD-10-CM | POA: Diagnosis not present

## 2020-10-08 DIAGNOSIS — H34812 Central retinal vein occlusion, left eye, with macular edema: Secondary | ICD-10-CM

## 2020-10-08 DIAGNOSIS — H353132 Nonexudative age-related macular degeneration, bilateral, intermediate dry stage: Secondary | ICD-10-CM

## 2020-10-20 ENCOUNTER — Telehealth: Payer: Self-pay | Admitting: Family Medicine

## 2020-10-20 DIAGNOSIS — R7989 Other specified abnormal findings of blood chemistry: Secondary | ICD-10-CM

## 2020-10-20 NOTE — Telephone Encounter (Signed)
-----   Message from Ellamae Sia sent at 09/29/2020  7:41 AM EDT ----- Regarding: Lab orders for Wednesday, 10.19.22 Lab orders for a 3 month follow up appt.

## 2020-10-21 ENCOUNTER — Other Ambulatory Visit: Payer: PPO

## 2020-10-22 ENCOUNTER — Other Ambulatory Visit (INDEPENDENT_AMBULATORY_CARE_PROVIDER_SITE_OTHER): Payer: PPO

## 2020-10-22 ENCOUNTER — Other Ambulatory Visit: Payer: Self-pay

## 2020-10-22 ENCOUNTER — Other Ambulatory Visit: Payer: PPO

## 2020-10-22 DIAGNOSIS — R7989 Other specified abnormal findings of blood chemistry: Secondary | ICD-10-CM

## 2020-10-22 LAB — BASIC METABOLIC PANEL
BUN: 23 mg/dL (ref 6–23)
CO2: 29 mEq/L (ref 19–32)
Calcium: 9.6 mg/dL (ref 8.4–10.5)
Chloride: 104 mEq/L (ref 96–112)
Creatinine, Ser: 1.28 mg/dL — ABNORMAL HIGH (ref 0.40–1.20)
GFR: 39.29 mL/min — ABNORMAL LOW (ref >60.00)
Glucose, Bld: 88 mg/dL (ref 70–99)
Potassium: 4.2 mEq/L (ref 3.5–5.1)
Sodium: 140 mEq/L (ref 135–145)

## 2020-10-23 ENCOUNTER — Telehealth: Payer: Self-pay | Admitting: Family Medicine

## 2020-10-23 DIAGNOSIS — N189 Chronic kidney disease, unspecified: Secondary | ICD-10-CM | POA: Insufficient documentation

## 2020-10-23 DIAGNOSIS — N1832 Chronic kidney disease, stage 3b: Secondary | ICD-10-CM

## 2020-10-23 NOTE — Telephone Encounter (Signed)
-----   Message from Tammi Sou, [place] sent at 10/23/2020  2:24 PM EDT ----- Pt notified of lab results and Dr. Marliss Coots comments. Pt agrees with referral she would like to see someone in [place] if possible

## 2020-11-05 ENCOUNTER — Other Ambulatory Visit: Payer: Self-pay

## 2020-11-05 ENCOUNTER — Encounter (INDEPENDENT_AMBULATORY_CARE_PROVIDER_SITE_OTHER): Payer: PPO | Admitting: Ophthalmology

## 2020-11-05 DIAGNOSIS — H353132 Nonexudative age-related macular degeneration, bilateral, intermediate dry stage: Secondary | ICD-10-CM

## 2020-11-05 DIAGNOSIS — H35033 Hypertensive retinopathy, bilateral: Secondary | ICD-10-CM

## 2020-11-05 DIAGNOSIS — H43813 Vitreous degeneration, bilateral: Secondary | ICD-10-CM

## 2020-11-05 DIAGNOSIS — I10 Essential (primary) hypertension: Secondary | ICD-10-CM

## 2020-11-05 DIAGNOSIS — H34812 Central retinal vein occlusion, left eye, with macular edema: Secondary | ICD-10-CM

## 2020-11-06 ENCOUNTER — Other Ambulatory Visit: Payer: Self-pay | Admitting: Family Medicine

## 2020-12-03 ENCOUNTER — Other Ambulatory Visit: Payer: Self-pay

## 2020-12-03 ENCOUNTER — Encounter (INDEPENDENT_AMBULATORY_CARE_PROVIDER_SITE_OTHER): Payer: PPO | Admitting: Ophthalmology

## 2020-12-03 DIAGNOSIS — H43813 Vitreous degeneration, bilateral: Secondary | ICD-10-CM | POA: Diagnosis not present

## 2020-12-03 DIAGNOSIS — H35033 Hypertensive retinopathy, bilateral: Secondary | ICD-10-CM | POA: Diagnosis not present

## 2020-12-03 DIAGNOSIS — H353132 Nonexudative age-related macular degeneration, bilateral, intermediate dry stage: Secondary | ICD-10-CM | POA: Diagnosis not present

## 2020-12-03 DIAGNOSIS — I10 Essential (primary) hypertension: Secondary | ICD-10-CM | POA: Diagnosis not present

## 2020-12-03 DIAGNOSIS — H34812 Central retinal vein occlusion, left eye, with macular edema: Secondary | ICD-10-CM

## 2021-01-07 ENCOUNTER — Encounter (INDEPENDENT_AMBULATORY_CARE_PROVIDER_SITE_OTHER): Payer: Medicare Other | Admitting: Ophthalmology

## 2021-01-07 ENCOUNTER — Other Ambulatory Visit: Payer: Self-pay

## 2021-01-07 DIAGNOSIS — H34812 Central retinal vein occlusion, left eye, with macular edema: Secondary | ICD-10-CM

## 2021-01-07 DIAGNOSIS — H35033 Hypertensive retinopathy, bilateral: Secondary | ICD-10-CM

## 2021-01-07 DIAGNOSIS — H353132 Nonexudative age-related macular degeneration, bilateral, intermediate dry stage: Secondary | ICD-10-CM

## 2021-01-07 DIAGNOSIS — H43813 Vitreous degeneration, bilateral: Secondary | ICD-10-CM | POA: Diagnosis not present

## 2021-01-07 DIAGNOSIS — I10 Essential (primary) hypertension: Secondary | ICD-10-CM | POA: Diagnosis not present

## 2021-01-19 DIAGNOSIS — Z862 Personal history of diseases of the blood and blood-forming organs and certain disorders involving the immune mechanism: Secondary | ICD-10-CM | POA: Diagnosis not present

## 2021-01-19 DIAGNOSIS — N390 Urinary tract infection, site not specified: Secondary | ICD-10-CM | POA: Diagnosis not present

## 2021-01-19 DIAGNOSIS — I129 Hypertensive chronic kidney disease with stage 1 through stage 4 chronic kidney disease, or unspecified chronic kidney disease: Secondary | ICD-10-CM | POA: Diagnosis not present

## 2021-01-19 DIAGNOSIS — N1832 Chronic kidney disease, stage 3b: Secondary | ICD-10-CM | POA: Diagnosis not present

## 2021-01-19 DIAGNOSIS — L4050 Arthropathic psoriasis, unspecified: Secondary | ICD-10-CM | POA: Diagnosis not present

## 2021-01-20 ENCOUNTER — Other Ambulatory Visit: Payer: Self-pay | Admitting: Nephrology

## 2021-01-20 DIAGNOSIS — Z862 Personal history of diseases of the blood and blood-forming organs and certain disorders involving the immune mechanism: Secondary | ICD-10-CM

## 2021-01-20 DIAGNOSIS — L4050 Arthropathic psoriasis, unspecified: Secondary | ICD-10-CM

## 2021-01-20 DIAGNOSIS — N1832 Chronic kidney disease, stage 3b: Secondary | ICD-10-CM

## 2021-01-20 DIAGNOSIS — I129 Hypertensive chronic kidney disease with stage 1 through stage 4 chronic kidney disease, or unspecified chronic kidney disease: Secondary | ICD-10-CM

## 2021-01-22 ENCOUNTER — Other Ambulatory Visit: Payer: Self-pay | Admitting: Nephrology

## 2021-01-22 DIAGNOSIS — I129 Hypertensive chronic kidney disease with stage 1 through stage 4 chronic kidney disease, or unspecified chronic kidney disease: Secondary | ICD-10-CM

## 2021-01-22 DIAGNOSIS — N1832 Chronic kidney disease, stage 3b: Secondary | ICD-10-CM

## 2021-01-22 DIAGNOSIS — L4050 Arthropathic psoriasis, unspecified: Secondary | ICD-10-CM

## 2021-01-22 DIAGNOSIS — Z862 Personal history of diseases of the blood and blood-forming organs and certain disorders involving the immune mechanism: Secondary | ICD-10-CM

## 2021-01-27 ENCOUNTER — Ambulatory Visit
Admission: RE | Admit: 2021-01-27 | Discharge: 2021-01-27 | Disposition: A | Discharge status: Home / Self Care | Payer: Medicare Other | Source: Ambulatory Visit | Attending: Nephrology | Admitting: Nephrology

## 2021-01-27 ENCOUNTER — Other Ambulatory Visit: Payer: Self-pay

## 2021-01-27 DIAGNOSIS — N189 Chronic kidney disease, unspecified: Secondary | ICD-10-CM | POA: Diagnosis not present

## 2021-01-27 DIAGNOSIS — N1832 Chronic kidney disease, stage 3b: Secondary | ICD-10-CM

## 2021-01-27 DIAGNOSIS — I129 Hypertensive chronic kidney disease with stage 1 through stage 4 chronic kidney disease, or unspecified chronic kidney disease: Secondary | ICD-10-CM

## 2021-01-27 DIAGNOSIS — Z862 Personal history of diseases of the blood and blood-forming organs and certain disorders involving the immune mechanism: Secondary | ICD-10-CM

## 2021-01-27 DIAGNOSIS — L4050 Arthropathic psoriasis, unspecified: Secondary | ICD-10-CM

## 2021-01-27 DIAGNOSIS — N3289 Other specified disorders of bladder: Secondary | ICD-10-CM | POA: Diagnosis not present

## 2021-02-03 DIAGNOSIS — Z85820 Personal history of malignant melanoma of skin: Secondary | ICD-10-CM | POA: Diagnosis not present

## 2021-02-03 DIAGNOSIS — D2262 Melanocytic nevi of left upper limb, including shoulder: Secondary | ICD-10-CM | POA: Diagnosis not present

## 2021-02-03 DIAGNOSIS — L578 Other skin changes due to chronic exposure to nonionizing radiation: Secondary | ICD-10-CM | POA: Diagnosis not present

## 2021-02-03 DIAGNOSIS — D2272 Melanocytic nevi of left lower limb, including hip: Secondary | ICD-10-CM | POA: Diagnosis not present

## 2021-02-03 DIAGNOSIS — D225 Melanocytic nevi of trunk: Secondary | ICD-10-CM | POA: Diagnosis not present

## 2021-02-03 DIAGNOSIS — L821 Other seborrheic keratosis: Secondary | ICD-10-CM | POA: Diagnosis not present

## 2021-02-03 DIAGNOSIS — L570 Actinic keratosis: Secondary | ICD-10-CM | POA: Diagnosis not present

## 2021-02-03 DIAGNOSIS — D2261 Melanocytic nevi of right upper limb, including shoulder: Secondary | ICD-10-CM | POA: Diagnosis not present

## 2021-02-04 ENCOUNTER — Other Ambulatory Visit: Payer: Self-pay

## 2021-02-04 ENCOUNTER — Encounter (INDEPENDENT_AMBULATORY_CARE_PROVIDER_SITE_OTHER): Payer: Medicare Other | Admitting: Ophthalmology

## 2021-02-04 DIAGNOSIS — H353132 Nonexudative age-related macular degeneration, bilateral, intermediate dry stage: Secondary | ICD-10-CM

## 2021-02-04 DIAGNOSIS — H34812 Central retinal vein occlusion, left eye, with macular edema: Secondary | ICD-10-CM | POA: Diagnosis not present

## 2021-02-04 DIAGNOSIS — H35033 Hypertensive retinopathy, bilateral: Secondary | ICD-10-CM | POA: Diagnosis not present

## 2021-02-04 DIAGNOSIS — I10 Essential (primary) hypertension: Secondary | ICD-10-CM | POA: Diagnosis not present

## 2021-02-04 DIAGNOSIS — H43813 Vitreous degeneration, bilateral: Secondary | ICD-10-CM

## 2021-02-05 ENCOUNTER — Other Ambulatory Visit: Payer: Self-pay | Admitting: Family Medicine

## 2021-02-08 DIAGNOSIS — Z79899 Other long term (current) drug therapy: Secondary | ICD-10-CM | POA: Diagnosis not present

## 2021-02-08 DIAGNOSIS — Z6824 Body mass index (BMI) 24.0-24.9, adult: Secondary | ICD-10-CM | POA: Diagnosis not present

## 2021-02-08 DIAGNOSIS — L409 Psoriasis, unspecified: Secondary | ICD-10-CM | POA: Diagnosis not present

## 2021-02-08 DIAGNOSIS — M150 Primary generalized (osteo)arthritis: Secondary | ICD-10-CM | POA: Diagnosis not present

## 2021-02-08 DIAGNOSIS — L4050 Arthropathic psoriasis, unspecified: Secondary | ICD-10-CM | POA: Diagnosis not present

## 2021-02-15 ENCOUNTER — Other Ambulatory Visit: Payer: Self-pay | Admitting: Family Medicine

## 2021-02-19 ENCOUNTER — Telehealth: Payer: Self-pay

## 2021-02-19 NOTE — Chronic Care Management (AMB) (Addendum)
Chronic Care Management Pharmacy Assistant   Name: Pamela Morales  MRN: [id number] DOB: 08/31/1940   Reason for Encounter: Adherence Review   Recent office visits:  09/19/20 - Family Medicine, [facility]MA, telemedicine - Patient presented for AWV. Discussed vaccines, screenings, physical activity.  Recent consult visits:  09/23/20 - Ophthalmology, Monna Fam - No data found  Hospital visits:  None in previous 6 months  Medications: Outpatient Encounter Medications as of 02/19/2021  Medication Sig Note   albuterol (VENTOLIN HFA) 108 (90 Base) MCG/ACT inhaler Inhale 2 puffs into the lungs every 4 (four) hours as needed for wheezing. 09/19/2020: PRN   amLODipine (NORVASC) 10 MG tablet TAKE 1 TABLET BY MOUTH EVERY DAY    budesonide-formoterol (SYMBICORT) 80-4.5 MCG/ACT inhaler Inhale 2 puffs into the lungs 2 (two) times daily. 09/19/2020: PRN   Calcium Carbonate-Vitamin D (CALTRATE 600+D PO) Take 1,200 mg by mouth every evening.     Cyanocobalamin (B-12 PO) Take 5,000 mg by mouth once a week. (B-12)    Doxylamine Succinate, Sleep, (UNISOM PO) Take 50 mg by mouth at bedtime.    Ferrous Sulfate (IRON) 325 (65 Fe) MG TABS Take 1 tablet by mouth daily.    FLUoxetine (PROZAC) 20 MG capsule TAKE 2 CAPSULES BY MOUTH EVERY DAY    losartan (COZAAR) 50 MG tablet TAKE 1 TABLET BY MOUTH EVERY DAY    MAGNESIUM PO Take 500 mg by mouth daily as needed (only takes when working out, for muscle cramps).     MELATONIN PO Take 10 mg by mouth at bedtime.    Multiple Vitamin (MULTIVITAMIN) tablet Take 1 tablet by mouth daily.    Multiple Vitamins-Minerals (PRESERVISION AREDS 2 PO) Take 1 capsule by mouth daily.    Secukinumab, 300 MG Dose, (COSENTYX, 300 MG DOSE,) 150 MG/ML SOSY Inject 1 application as directed every 30 (thirty) days.    TURMERIC CURCUMIN PO Take 500 mg by mouth daily.    No facility-administered encounter medications on file as of 02/19/2021.     [place] on 02/19/21 for  general disease state and medication adherence call.   Patient is not more than 5 days past due for refill on the following medications per chart history:  [facility] Medications: Medication Name/mg Last Fill Days Supply Losartan 50mg   02/05/21  90    What concerns do you have about your medications? The patient reports no concerns.  The patient denies side effects with their medications.   How often do you forget or accidentally miss a dose? Never  Do you use a pillbox? No  the patient reports she just gets medications out of the bottles   Are you having any problems getting your medications from your pharmacy? [facility]  [place]- patient reports she just got 90ds at no cost to her.   Has the cost of your medications been a concern? No  Since last visit with CPP, no interventions have been made.   The patient has not had an ED visit since last contact.   The patient denies problems with their health. The patient has been getting injections with [place] and tolerating well.  Patient denies concerns or questions for Debbora Dus, PharmD at this time.   Counseled patient on:  Importance of taking medication daily without missed doses   Care Gaps: Annual wellness visit in last year? No Most Recent BP reading: 138/76  51-P  06/18/20  Upcoming appointments:  Optometry  03/16/21      Debbora Dus,  CPP notified  Avel Sensor, [place] Assistant [phone]  I have reviewed the care management and care coordination activities outlined in this encounter and I am certifying that I agree with the content of this note. No further action required.  Debbora Dus, PharmD Clinical Pharmacist [facility] [phone]

## 2021-03-16 ENCOUNTER — Other Ambulatory Visit: Payer: Self-pay

## 2021-03-16 ENCOUNTER — Encounter (INDEPENDENT_AMBULATORY_CARE_PROVIDER_SITE_OTHER): Payer: Medicare Other | Admitting: Ophthalmology

## 2021-03-16 DIAGNOSIS — I10 Essential (primary) hypertension: Secondary | ICD-10-CM

## 2021-03-16 DIAGNOSIS — H43813 Vitreous degeneration, bilateral: Secondary | ICD-10-CM | POA: Diagnosis not present

## 2021-03-16 DIAGNOSIS — H353111 Nonexudative age-related macular degeneration, right eye, early dry stage: Secondary | ICD-10-CM

## 2021-03-16 DIAGNOSIS — H353122 Nonexudative age-related macular degeneration, left eye, intermediate dry stage: Secondary | ICD-10-CM

## 2021-03-16 DIAGNOSIS — H35033 Hypertensive retinopathy, bilateral: Secondary | ICD-10-CM

## 2021-03-16 DIAGNOSIS — H34812 Central retinal vein occlusion, left eye, with macular edema: Secondary | ICD-10-CM

## 2021-04-28 ENCOUNTER — Ambulatory Visit (INDEPENDENT_AMBULATORY_CARE_PROVIDER_SITE_OTHER)
Admission: RE | Admit: 2021-04-28 | Discharge: 2021-04-28 | Disposition: A | Discharge status: Home / Self Care | Payer: Medicare Other | Source: Ambulatory Visit | Attending: Family Medicine | Admitting: Family Medicine

## 2021-04-28 ENCOUNTER — Encounter: Payer: Self-pay | Admitting: Family Medicine

## 2021-04-28 ENCOUNTER — Ambulatory Visit (INDEPENDENT_AMBULATORY_CARE_PROVIDER_SITE_OTHER): Payer: Medicare Other | Admitting: Family Medicine

## 2021-04-28 VITALS — BP 140/70 | HR 76 | Temp 97.6°F | Ht 60.0 in | Wt 133.4 lb

## 2021-04-28 DIAGNOSIS — I700 Atherosclerosis of aorta: Secondary | ICD-10-CM | POA: Diagnosis not present

## 2021-04-28 DIAGNOSIS — M25512 Pain in left shoulder: Secondary | ICD-10-CM | POA: Insufficient documentation

## 2021-04-28 DIAGNOSIS — E2839 Other primary ovarian failure: Secondary | ICD-10-CM

## 2021-04-28 DIAGNOSIS — M19012 Primary osteoarthritis, left shoulder: Secondary | ICD-10-CM | POA: Diagnosis not present

## 2021-04-28 DIAGNOSIS — J209 Acute bronchitis, unspecified: Secondary | ICD-10-CM

## 2021-04-28 MED ORDER — PREDNISONE 10 MG PO TABS: ORAL_TABLET | ORAL | 0 refills | Status: DC

## 2021-04-28 NOTE — Assessment & Plan Note (Signed)
Fell on L shoulder mid month and still sore ?Was bruised-that is improved  ?Pos Neer sign and pain with int rotation on exam  ?Xray ordered and pending  ?Recommend ice  ?Passive rom exercise handout given  ?

## 2021-04-28 NOTE — Progress Notes (Signed)
? ?Subjective:  ? ? Patient ID: Pamela Morales, female    DOB: 06-04-1940, 81 y.o.   MRN: [id number] ? ?HPI ?Pt presents with L shoulder pain  ?Now also has cough and congestion  ? ?Wt Readings from Last 3 Encounters:  ?04/28/21 133 lb 6 oz (60.5 kg)  ?06/18/20 135 lb (61.2 kg)  ?01/27/20 135 lb 9 oz (61.5 kg)  ? ?26.05 kg/m? ? ?Monday afternoon started sore throat  ?Neg covid test at home  ?Chest congestion-rattling but not coming up  ? ?Some wheezing  ? ?Ears are ok  ?Some runny nose/sniffling  ? ?Took allegra for allergies  ?Chlorcedin hbp for congestion  ?Mucous is clear  ? ?May need some prednisone  ?No fever but had chills once  ? ?Has not used albuterol yet  ?On symbicort  ? ?Left shoulder-injured it  on 4/13 ?She stepped down with a ladder / forgot a step at the end  ?She fell to the floor  ?L shoulder may have hit cabinets  ?Was bruised initially  ? ?Now hurts to internally rotate her arm and a little to lift above head  ?Pain was never severe but moderate  ?Does not hurt unless she moves it  ? ?Has h/o psoriatic arthritis ? ?Patient Active Problem List  ? Diagnosis Date Noted  ? Left shoulder pain 04/28/2021  ? CRI (chronic renal insufficiency) 10/23/2020  ? Acute bronchitis 05/25/2020  ? Left foot pain 01/27/2020  ? High triglycerides 07/16/2019  ? Laceration of right knee 12/10/2018  ? COPD (chronic obstructive pulmonary disease) ([place]) 07/12/2018  ? Elevated TSH 10/18/2016  ? Elevated serum creatinine 10/18/2016  ? Estrogen deficiency 03/23/2016  ? Encounter for screening mammogram for breast cancer 03/23/2016  ? Psoriatic arthritis ([place]) 03/31/2015  ? Routine general medical examination at a health care facility 03/25/2015  ? Heartburn 03/03/2015  ? Wheezing 01/09/2015  ? Fatigue 04/28/2014  ? Family history of colon cancer 01/01/2013  ? Personal history of colonic polyps 01/01/2013  ? Colon cancer screening 12/19/2012  ? Hearing loss 09/19/2012  ? 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  ?  Colon polyps 07/16/2010  ? BACK PAIN WITH RADICULOPATHY 03/15/2007  ? History of herpes zoster 02/23/2007  ? Essential hypertension 02/05/2007  ? Osteopenia 02/05/2007  ? ECZEMA, ATOPIC 01/29/2007  ? ROSACEA 01/29/2007  ? BASAL CELL CARCINOMA, HX OF 01/29/2007  ? ?Past Medical History:  ?Diagnosis Date  ? Adenomatous polyp of colon   ? Allergy   ? Anemia   ? PAST HX   ? CA - cancer of ovary   ? Cancer ([place]) 02/10  ? R breast  (est rec neg and brac neg)  ? Cataract   ? FORMING   ? COPD (chronic obstructive pulmonary disease) ([place])   ? DDD (degenerative disc disease)   ? in back with chronic pain  ? Depression   ? Diverticulosis   ? Eczema   ? Hypertension   ? Osteopenia   ? Rosacea   ? Zoster 02/09  ? ?Past Surgical History:  ?Procedure Laterality Date  ? ABDOMINAL HYSTERECTOMY  1996  ? BSO fibroids, incidental ovarian CA finding  ? BASAL CELL CARCINOMA EXCISION  09/2016  ? bladder tack    ? BREAST SURGERY    ? rt total mastectomy for breast CA  ? CHOLECYSTECTOMY  08/1996  ? 10 yrs ago  ? COLONOSCOPY    ? lipoma removal  11/07  ? POLYPECTOMY    ? ?  Social History  ? ?Tobacco Use  ? Smoking status: Former  ?  Types: Cigarettes  ?  Quit date: 01/03/1990  ?  Years since quitting: 31.3  ? Smokeless tobacco: Never  ?Vaping Use  ? Vaping Use: Never used  ?Substance Use Topics  ? Alcohol use: Yes  ?  Alcohol/week: 7.0 standard drinks  ?  Types: 5 Glasses of wine, 2 Standard drinks or equivalent per week  ? Drug use: No  ? ?Family History  ?Problem Relation Age of Onset  ? Cancer Mother   ?     stomach  ? Hypertension Mother   ? Heart disease Mother   ?     CAD  ? Stomach cancer Mother   ? Cancer Father   ?     lung  ? Cancer Sister   ?     colon, skin  ? Heart disease Sister 30  ?     MI  ? Diabetes Sister   ? Colon cancer Sister 36  ? Heart disease Brother   ?     HTN and MI  ? Hypertension Brother   ? Colon cancer Daughter 38  ? Esophageal cancer Neg Hx   ? Colon polyps Neg Hx   ? ?Allergies  ?Allergen Reactions  ? Sulfa  Antibiotics   ? ?Current Outpatient Medications on File Prior to Visit  ?Medication Sig Dispense Refill  ? albuterol (VENTOLIN HFA) 108 (90 Base) MCG/ACT inhaler Inhale 2 puffs into the lungs every 4 (four) hours as needed for wheezing. 1 each 3  ? amLODipine (NORVASC) 10 MG tablet TAKE 1 TABLET BY MOUTH EVERY DAY 90 tablet 1  ? budesonide-formoterol (SYMBICORT) 80-4.5 MCG/ACT inhaler Inhale 2 puffs into the lungs 2 (two) times daily. 1 Inhaler 0  ? Calcium Carbonate-Vitamin D (CALTRATE 600+D PO) Take 1,200 mg by mouth every evening.     ? Cyanocobalamin (B-12 PO) Take 5,000 mg by mouth once a week. (B-12)    ? Doxylamine Succinate, Sleep, (UNISOM PO) Take 50 mg by mouth at bedtime.    ? Ferrous Sulfate (IRON) 325 (65 Fe) MG TABS Take 1 tablet by mouth daily.    ? FLUoxetine (PROZAC) 20 MG capsule TAKE 2 CAPSULES BY MOUTH EVERY DAY 180 capsule 1  ? losartan (COZAAR) 50 MG tablet TAKE 1 TABLET BY MOUTH EVERY DAY 90 tablet 0  ? MAGNESIUM PO Take 500 mg by mouth daily as needed (only takes when working out, for muscle cramps).     ? MELATONIN PO Take 10 mg by mouth at bedtime.    ? Multiple Vitamin (MULTIVITAMIN) tablet Take 1 tablet by mouth daily.    ? Multiple Vitamins-Minerals (PRESERVISION AREDS 2 PO) Take 1 capsule by mouth daily.    ? Secukinumab, 300 MG Dose, (COSENTYX, 300 MG DOSE,) 150 MG/ML SOSY Inject 1 application as directed every 30 (thirty) days.    ? ?No current facility-administered medications on file prior to visit.  ?  ?Review of Systems  ?Constitutional:  Positive for appetite change and fatigue. Negative for fever.  ?HENT:  Positive for congestion, postnasal drip, rhinorrhea, sinus pressure, sneezing and sore throat. Negative for ear pain.   ?Eyes:  Negative for pain and discharge.  ?Respiratory:  Positive for cough. Negative for shortness of breath, wheezing and stridor.   ?Cardiovascular:  Negative for chest pain.  ?Gastrointestinal:  Negative for diarrhea, nausea and vomiting.  ?Genitourinary:   Negative for frequency, hematuria and urgency.  ?Musculoskeletal:  Negative for  arthralgias and myalgias.  ?     Left shoulder pain   ?Skin:  Negative for rash.  ?Neurological:  Positive for headaches. Negative for dizziness, weakness and light-headedness.  ?Psychiatric/Behavioral:  Negative for confusion and dysphoric mood.   ? ?   ?Objective:  ? Physical Exam ?Constitutional:   ?   General: She is not in acute distress. ?   Appearance: Normal appearance. She is well-developed and normal weight. She is not ill-appearing, toxic-appearing or diaphoretic.  ?HENT:  ?   Head: Normocephalic and atraumatic.  ?   Comments: Nares are injected and congested   ?   Right Ear: Tympanic membrane, ear canal and external ear normal.  ?   Left Ear: Tympanic membrane, ear canal and external ear normal.  ?   Nose: Congestion and rhinorrhea present.  ?   Mouth/Throat:  ?   Mouth: Mucous membranes are moist.  ?   Pharynx: Oropharynx is clear. No oropharyngeal exudate or posterior oropharyngeal erythema.  ?   Comments: Clear pnd  ?Eyes:  ?   General:     ?   Right eye: No discharge.     ?   Left eye: No discharge.  ?   Conjunctiva/sclera: Conjunctivae normal.  ?   Pupils: Pupils are equal, round, and reactive to light.  ?Cardiovascular:  ?   Rate and Rhythm: Normal rate.  ?   Heart sounds: Normal heart sounds.  ?Pulmonary:  ?   Effort: Pulmonary effort is normal. No respiratory distress.  ?   Breath sounds: No stridor. Wheezing present. No rhonchi or rales.  ?   Comments: Diffusely distant bs ?Some end exp wheezes ?Chest:  ?   Chest wall: No tenderness.  ?Musculoskeletal:  ?   Cervical back: Normal range of motion and neck supple.  ?   Comments: Left shoulder ? ?No deformity/swelling/warmth or erythema (scar from skin surg in deltoid area ?Prev ecchymosis is almost gone  ?No crepitus  ?No obvious effusion  ?Abduction - over 90 deg  ?Hawking test -nl ?Neer test -painful ?Internal rotation -painful  ?External rotation -fair ?Tenderness  -mild in acromion and deltoid area ?Normal grip and hand dexterity ?  ?  ?Lymphadenopathy:  ?   Cervical: No cervical adenopathy.  ?Skin: ?   General: Skin is warm and dry.  ?   Capillary Refill: Capillary r

## 2021-04-28 NOTE — Patient Instructions (Signed)
Take prednisone as directed for wheezing and congestion and shoulder pain  ? ?Drink fluids  ?Treat your symptoms  ?Rest  ?Delsym is helpful for cough  ?Use your albuterol if you need it  ?If you have trouble breathing go to the ER ? ?Xray of shoulder today  ?Use ice  ?Do passive range of motion exercises if you can to prevent stiffness ? ?Lets see how the xray looks  ? ?

## 2021-04-28 NOTE — Assessment & Plan Note (Signed)
Day 3 of viral uri with neg covid test and no fever ?Mild wheezing in setting of copd ?40 mg prednisone taper px  ?Rev side eff ?Disc sympt control ?Tessalon for cough prn  ?Update if not starting to improve in a week or if worsening  ?ER precautions given  ?

## 2021-04-29 ENCOUNTER — Encounter (INDEPENDENT_AMBULATORY_CARE_PROVIDER_SITE_OTHER): Payer: Medicare Other | Admitting: Ophthalmology

## 2021-04-29 DIAGNOSIS — H34812 Central retinal vein occlusion, left eye, with macular edema: Secondary | ICD-10-CM | POA: Diagnosis not present

## 2021-04-29 DIAGNOSIS — H353131 Nonexudative age-related macular degeneration, bilateral, early dry stage: Secondary | ICD-10-CM

## 2021-04-29 DIAGNOSIS — H43813 Vitreous degeneration, bilateral: Secondary | ICD-10-CM

## 2021-04-29 DIAGNOSIS — H35033 Hypertensive retinopathy, bilateral: Secondary | ICD-10-CM | POA: Diagnosis not present

## 2021-04-29 DIAGNOSIS — I10 Essential (primary) hypertension: Secondary | ICD-10-CM | POA: Diagnosis not present

## 2021-05-02 NOTE — Addendum Note (Signed)
Addended by: Loura Pardon A on: 05/02/2021 04:02 PM ? ? Modules accepted: Orders ? ?

## 2021-05-10 ENCOUNTER — Telehealth: Payer: Self-pay

## 2021-05-10 NOTE — Telephone Encounter (Signed)
Please see notes below access note; sending to Dr Glori Bickers and Tribbey CMA. Pt has appt to see Dr Glori Bickers on 05/11/21. Pt has already refused to go to ED or UC unless pt condition worsens and then pt will go.  ? ? ?[facility] - Client ?TELEPHONE ADVICE RECORD ?AccessNurse? ?Patient ?Name: ?Khamiya BRA ?NCH ?Gender: Female ?DOB: 09-25-1940 ?Age: 81 Y 16 D ?Return ?Phone ?Number: ?[id number] ?(Primary) ?Address: ?City/ ?State/ ?Zip: [place] ? [address] ?Client [facility] - Client ?Client Site [facility] Day ?Provider [place], Roque Lias - MD ?Contact Type Call ?Who Is Calling Patient / Member / Family / Caregiver ?Call Type Triage / Clinical ?Relationship To Patient Self ?Return Phone Number [phone] (Primary) ?Chief Complaint Weakness, Generalized ?Reason for Call Symptomatic / Request for Health Information ?Initial Comment Caller states she is very weak and has no energy. ?Was recently treated for bronchitis. ?Translation No ?Nurse Assessment ?Nurse: Thad Ranger RN, Denise Date/Time (Eastern Time): 05/10/2021 10:48:07 AM ?Confirm and document reason for call. If ?symptomatic, describe symptoms. ?---Caller states she is very weak and has no energy. ?Was recently treated for bronchitis 1 wk. Using inhaler ?more than she normally has to use it for wheezing/ ?breathing. ?Does the patient have any new or worsening ?symptoms? ---Yes ?Will a triage be completed? ---Yes ?Related visit to physician within the last 2 weeks? ---Yes ?Does the PT have any chronic conditions? (i.e. ?diabetes, asthma, this includes High risk factors for ?pregnancy, etc.) ?---Yes ?List chronic conditions. ---COPD ?Is this a behavioral health or substance abuse call? ---No ?Guidelines ?Guideline Title Affirmed Question Affirmed Notes Nurse Date/Time (Eastern ?Time) ?Breathing Difficulty [1] MODERATE ?difficulty breathing ?(e.g., speaks in ?phrases, SOB even at ?rest, pulse  100-120) ?AND [2] NEW-onset ?or WORSE than ?normal ?Carmon, RN, Denise 05/10/2021 10:51:06 ?AM ?PLEASE NOTE: All timestamps contained within this report are represented as [place] Standard Time. ?CONFIDENTIALTY NOTICE: This fax transmission is intended only for the addressee. It contains information that is legally privileged, confidential or ?otherwise protected from use or disclosure. If you are not the intended recipient, you are strictly prohibited from reviewing, disclosing, copying using ?or disseminating any of this information or taking any action in reliance on or regarding this information. If you have received this fax in error, please ?notify us immediately by telephone so that we can arrange for its return to [place]. Phone: [phone], Toll-Free: [phone], Fax: [phone] ?Page: 2 of 2 ?Call Id: [id number] ?Disp. Time (Eastern ?Time) Disposition Final User ?05/10/2021 10:56:58 AM Go to ED Now Yes Carmon, RN, Langley Gauss ?Caller Disagree/Comply Comply ?Caller Understands Yes ?PreDisposition Call Doctor ?Care Advice Given Per Guideline ?GO TO ED NOW: NOTE TO TRIAGER - DRIVING: * Another adult should drive. BRING MEDICINES: * Bring a list of your ?current medicines when you go to the Emergency Department (ER). CALL EMS 911 IF: * Call EMS if you become worse. CARE ?ADVICE given per Breathing Difficulty (Adult) guideline. ?Comments ?User: Romeo Apple, RN Date/Time Eilene Ghazi Time): 05/10/2021 10:51:21 AM ?Pt has audible dyspnea over the phone. ?User: Romeo Apple, RN Date/Time Eilene Ghazi Time): 05/10/2021 10:55:07 AM ?She only has spiriva and Cymacort. ?User: Romeo Apple, RN Date/Time Eilene Ghazi Time): 05/10/2021 10:56:51 AM ?She refuses to go to ER and wants to be seen in office. Reinforced need to be seen in ER asap for dyspnea but she ?cont to refuse. Called [facility] for appt and [facility] pt for appt. ?Referrals ?REFERRED TO PCP OFFIC ?

## 2021-05-10 NOTE — Telephone Encounter (Signed)
Louretta Shorten CMA working with Dr Glori Bickers today said that pt has appt to see Dr Glori Bickers on 05/11/21 at 12 noon and access nurse advised pt should go to ED but declined by pt. Dr Glori Bickers told Louretta Shorten if pt is SOB and weak should not wait until 05/11/21 to be seen and should go to ED or UC now. I spoke with pt and she said she does not want to go to ED' pt thinks her breathing is a little better but pt is still feeling weak but pt said she has been weak since she had bronchitis the end of April. Offered if pt refused ED to make appt at [facility] and pt said no she knows her body and she wants to keep appt on 05/11/21 with Dr Glori Bickers. Pt said she trust Dr Glori Bickers but she is going to wait until tomorrow to see Dr Glori Bickers. UC & ED precautions given and pt said if she got worse she would go be seen today. Sending note to Dr Glori Bickers and Louretta Shorten CMA and will teams Tarsha.  When access nurse is available will attach to this note.  ?

## 2021-05-10 NOTE — Telephone Encounter (Signed)
Agree with ER precautions  ?

## 2021-05-11 ENCOUNTER — Other Ambulatory Visit: Payer: Self-pay | Admitting: *Deleted

## 2021-05-11 ENCOUNTER — Telehealth: Payer: Self-pay | Admitting: *Deleted

## 2021-05-11 ENCOUNTER — Ambulatory Visit (INDEPENDENT_AMBULATORY_CARE_PROVIDER_SITE_OTHER)
Admission: RE | Admit: 2021-05-11 | Discharge: 2021-05-11 | Disposition: A | Discharge status: Home / Self Care | Payer: Medicare Other | Source: Ambulatory Visit | Attending: Family Medicine | Admitting: Family Medicine

## 2021-05-11 ENCOUNTER — Ambulatory Visit (INDEPENDENT_AMBULATORY_CARE_PROVIDER_SITE_OTHER): Payer: Medicare Other | Admitting: Family Medicine

## 2021-05-11 ENCOUNTER — Encounter: Payer: Self-pay | Admitting: Family Medicine

## 2021-05-11 VITALS — BP 128/68 | HR 62 | Temp 98.0°F | Ht 60.0 in | Wt 134.4 lb

## 2021-05-11 DIAGNOSIS — R911 Solitary pulmonary nodule: Secondary | ICD-10-CM | POA: Diagnosis not present

## 2021-05-11 DIAGNOSIS — R9389 Abnormal findings on diagnostic imaging of other specified body structures: Secondary | ICD-10-CM | POA: Insufficient documentation

## 2021-05-11 DIAGNOSIS — J449 Chronic obstructive pulmonary disease, unspecified: Secondary | ICD-10-CM | POA: Diagnosis not present

## 2021-05-11 DIAGNOSIS — R531 Weakness: Secondary | ICD-10-CM | POA: Diagnosis not present

## 2021-05-11 DIAGNOSIS — J209 Acute bronchitis, unspecified: Secondary | ICD-10-CM | POA: Diagnosis not present

## 2021-05-11 DIAGNOSIS — R0789 Other chest pain: Secondary | ICD-10-CM | POA: Diagnosis not present

## 2021-05-11 DIAGNOSIS — I10 Essential (primary) hypertension: Secondary | ICD-10-CM | POA: Diagnosis not present

## 2021-05-11 MED ORDER — BUDESONIDE-FORMOTEROL FUMARATE 80-4.5 MCG/ACT IN AERO: 2.0000 | INHALATION_SPRAY | Freq: Two times a day (BID) | RESPIRATORY_TRACT | 0 refills | Status: DC

## 2021-05-11 MED ORDER — AMOXICILLIN-POT CLAVULANATE 875-125 MG PO TABS: 1.0000 | ORAL_TABLET | Freq: Two times a day (BID) | ORAL | 0 refills | Status: DC

## 2021-05-11 NOTE — Addendum Note (Signed)
Addended by: Loura Pardon A on: 05/11/2021 05:14 PM ? ? Modules accepted: Orders ? ?

## 2021-05-11 NOTE — Telephone Encounter (Signed)
Dr. Glori Bickers tried to reach pt with no answer. ? ? ?I called pt again but no answer and so Left VM requesting pt to call the office back  ?

## 2021-05-11 NOTE — Assessment & Plan Note (Addendum)
Noted today in setting of improving bronchitis  ?Will order CT to visualize better  ?Former smoker  ?Personal h/o breast cancer ?Has copd  ? ? ? ?

## 2021-05-11 NOTE — Assessment & Plan Note (Signed)
Cough improved but still some chest tightness and fatigue and now dull headache/frontal  ? ?Suspect acute sinusitis s/p uri  ?tx with augmentin  ?Lung exam is reassuring- cxr ordered  ?Using symbicort and ventolin prn for copd as well  ? ?LUL opacity/poss mass seen on cxr ?Will order CT scan of chest  ?

## 2021-05-11 NOTE — Telephone Encounter (Signed)
Addressed through result notes  

## 2021-05-11 NOTE — Telephone Encounter (Signed)
-----   Message from Abner Greenspan, MD sent at 05/11/2021  1:46 PM EDT ----- ?I tried to call you earlier but did not know if I could leave a message ?Yoru xray shows a shadow in the left lung we need to check out with a CT scan ?Let me know if that is ok and I will order it  ? ? ?

## 2021-05-11 NOTE — Patient Instructions (Signed)
Take augmentin for sinus infection  ?Keep resting as you need to  ?Drink fluids  ? ?Continue your inhalers  ? ?Chest xray today  ?We will contact you with results  ? ?Update if not starting to improve in a week or if worsening   ?

## 2021-05-11 NOTE — Progress Notes (Signed)
? ?Subjective:  ? ? Patient ID: Pamela Morales, female    DOB: Oct 22, 1940, 81 y.o.   MRN: [id number] ? ?HPI ?Pt presents for f/u of bronchitis with sob and weakness ? ?Wt Readings from Last 3 Encounters:  ?05/11/21 134 lb 6 oz (61 kg)  ?04/28/21 133 lb 6 oz (60.5 kg)  ?06/18/20 135 lb (61.2 kg)  ? ?26.24 kg/m? ? ?Pulse ox today is 96% on RA ? ?Seen for acute bronchitis on 4/26 ?Was day 3 of viral uri  ?Neg covid testing  ?Had mild wheezing and prednisone px ? ?Tessalon px for cough ? ?Now very weak  ?Tired/wiped out  ?Sleeps fair (during prednisone kept her from sleeping) then that improved  ? ?Dull headache across forehead  ? ? ?Has copd ?Uses symbicort and ventolin  ? ?Cxr report today notes opacity/ possible new mass in LUL ? ? ? ? ?Patient Active Problem List  ? Diagnosis Date Noted  ? Left shoulder pain 04/28/2021  ? CRI (chronic renal insufficiency) 10/23/2020  ? Acute bronchitis 05/25/2020  ? Left foot pain 01/27/2020  ? High triglycerides 07/16/2019  ? Laceration of right knee 12/10/2018  ? COPD (chronic obstructive pulmonary disease) ([place]) 07/12/2018  ? Elevated TSH 10/18/2016  ? Elevated serum creatinine 10/18/2016  ? Estrogen deficiency 03/23/2016  ? Encounter for screening mammogram for breast cancer 03/23/2016  ? Psoriatic arthritis ([place]) 03/31/2015  ? Routine general medical examination at a health care facility 03/25/2015  ? Heartburn 03/03/2015  ? Fatigue 04/28/2014  ? Family history of colon cancer 01/01/2013  ? Personal history of colonic polyps 01/01/2013  ? Colon cancer screening 12/19/2012  ? Hearing loss 09/19/2012  ? 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  ? Colon polyps 07/16/2010  ? BACK PAIN WITH RADICULOPATHY 03/15/2007  ? History of herpes zoster 02/23/2007  ? Essential hypertension 02/05/2007  ? Osteopenia 02/05/2007  ? ECZEMA, ATOPIC 01/29/2007  ? ROSACEA 01/29/2007  ? BASAL CELL CARCINOMA, HX OF 01/29/2007  ? ?Past Medical History:  ?Diagnosis Date  ? Adenomatous polyp of  colon   ? Allergy   ? Anemia   ? PAST HX   ? CA - cancer of ovary   ? Cancer ([place]) 02/10  ? R breast  (est rec neg and brac neg)  ? Cataract   ? FORMING   ? COPD (chronic obstructive pulmonary disease) ([place])   ? DDD (degenerative disc disease)   ? in back with chronic pain  ? Depression   ? Diverticulosis   ? Eczema   ? Hypertension   ? Osteopenia   ? Rosacea   ? Zoster 02/09  ? ?Past Surgical History:  ?Procedure Laterality Date  ? ABDOMINAL HYSTERECTOMY  1996  ? BSO fibroids, incidental ovarian CA finding  ? BASAL CELL CARCINOMA EXCISION  09/2016  ? bladder tack    ? BREAST SURGERY    ? rt total mastectomy for breast CA  ? CHOLECYSTECTOMY  08/1996  ? 10 yrs ago  ? COLONOSCOPY    ? lipoma removal  11/07  ? POLYPECTOMY    ? ?Social History  ? ?Tobacco Use  ? Smoking status: Former  ?  Types: Cigarettes  ?  Quit date: 01/03/1990  ?  Years since quitting: 31.3  ? Smokeless tobacco: Never  ?Vaping Use  ? Vaping Use: Never used  ?Substance Use Topics  ? Alcohol use: Yes  ?  Alcohol/week: 7.0 standard drinks  ?  Types: 5 Glasses of wine,  2 Standard drinks or equivalent per week  ? Drug use: No  ? ?Family History  ?Problem Relation Age of Onset  ? Cancer Mother   ?     stomach  ? Hypertension Mother   ? Heart disease Mother   ?     CAD  ? Stomach cancer Mother   ? Cancer Father   ?     lung  ? Cancer Sister   ?     colon, skin  ? Heart disease Sister 62  ?     MI  ? Diabetes Sister   ? Colon cancer Sister 8  ? Heart disease Brother   ?     HTN and MI  ? Hypertension Brother   ? Colon cancer Daughter 10  ? Esophageal cancer Neg Hx   ? Colon polyps Neg Hx   ? ?Allergies  ?Allergen Reactions  ? Sulfa Antibiotics   ? ?Current Outpatient Medications on File Prior to Visit  ?Medication Sig Dispense Refill  ? albuterol (VENTOLIN HFA) 108 (90 Base) MCG/ACT inhaler Inhale 2 puffs into the lungs every 4 (four) hours as needed for wheezing. 1 each 3  ? amLODipine (NORVASC) 10 MG tablet TAKE 1 TABLET BY MOUTH EVERY DAY 90 tablet 1  ?  Calcium Carbonate-Vitamin D (CALTRATE 600+D PO) Take 1,200 mg by mouth every evening.     ? Cyanocobalamin (B-12 PO) Take 5,000 mg by mouth once a week. (B-12)    ? Doxylamine Succinate, Sleep, (UNISOM PO) Take 50 mg by mouth at bedtime.    ? Ferrous Sulfate (IRON) 325 (65 Fe) MG TABS Take 1 tablet by mouth daily.    ? FLUoxetine (PROZAC) 20 MG capsule TAKE 2 CAPSULES BY MOUTH EVERY DAY 180 capsule 1  ? losartan (COZAAR) 50 MG tablet TAKE 1 TABLET BY MOUTH EVERY DAY 90 tablet 0  ? MAGNESIUM PO Take 500 mg by mouth daily as needed (only takes when working out, for muscle cramps).     ? MELATONIN PO Take 10 mg by mouth at bedtime.    ? Multiple Vitamin (MULTIVITAMIN) tablet Take 1 tablet by mouth daily.    ? Multiple Vitamins-Minerals (PRESERVISION AREDS 2 PO) Take 1 capsule by mouth daily.    ? Secukinumab, 300 MG Dose, (COSENTYX, 300 MG DOSE,) 150 MG/ML SOSY Inject 1 application as directed every 30 (thirty) days.    ? ?No current facility-administered medications on file prior to visit.  ?  ?Review of Systems  ?Constitutional:  Negative for activity change, appetite change, fatigue, fever and unexpected weight change.  ?HENT:  Negative for congestion, ear pain, rhinorrhea, sinus pressure and sore throat.   ?Eyes:  Negative for pain, redness and visual disturbance.  ?Respiratory:  Positive for cough, chest tightness and shortness of breath. Negative for apnea, wheezing and stridor.   ?Cardiovascular:  Negative for chest pain and palpitations.  ?Gastrointestinal:  Negative for abdominal pain, blood in stool, constipation and diarrhea.  ?Endocrine: Negative for polydipsia and polyuria.  ?Genitourinary:  Negative for dysuria, frequency and urgency.  ?Musculoskeletal:  Negative for arthralgias, back pain and myalgias.  ?Skin:  Negative for pallor and rash.  ?Allergic/Immunologic: Negative for environmental allergies.  ?Neurological:  Negative for dizziness, syncope and headaches.  ?Hematological:  Negative for  adenopathy. Does not bruise/bleed easily.  ?Psychiatric/Behavioral:  Negative for decreased concentration and dysphoric mood. The patient is not nervous/anxious.   ? ?   ?Objective:  ? Physical Exam ?Constitutional:   ?  General: She is not in acute distress. ?   Appearance: She is well-developed and normal weight. She is not ill-appearing or diaphoretic.  ?HENT:  ?   Head: Normocephalic and atraumatic.  ?Eyes:  ?   Conjunctiva/sclera: Conjunctivae normal.  ?   Pupils: Pupils are equal, round, and reactive to light.  ?Neck:  ?   Thyroid: No thyromegaly.  ?   Vascular: No carotid bruit or JVD.  ?Cardiovascular:  ?   Rate and Rhythm: Normal rate and regular rhythm.  ?   Heart sounds: Normal heart sounds.  ?  No gallop.  ?Pulmonary:  ?   Effort: Pulmonary effort is normal. No respiratory distress.  ?   Breath sounds: Normal breath sounds. No stridor. No wheezing, rhonchi or rales.  ?   Comments: Distant bs ?No wheeze or stridor ?No crackles or rales  ?Chest:  ?   Chest wall: No tenderness.  ?Abdominal:  ?   General: Abdomen is flat. There is no abdominal bruit.  ?Musculoskeletal:  ?   Cervical back: Normal range of motion and neck supple.  ?   Right lower leg: No edema.  ?   Left lower leg: No edema.  ?Lymphadenopathy:  ?   Cervical: No cervical adenopathy.  ?Skin: ?   General: Skin is warm and dry.  ?   Coloration: Skin is not pale.  ?   Findings: No erythema or rash.  ?Neurological:  ?   Mental Status: She is alert.  ?   Coordination: Coordination normal.  ?   Deep Tendon Reflexes: Reflexes are normal and symmetric. Reflexes normal.  ?Psychiatric:     ?   Mood and Affect: Mood normal.  ? ? ? ? ? ?   ?Assessment & Plan:  ? ?Problem List Items Addressed This Visit   ? ?  ? Respiratory  ? Acute bronchitis - Primary  ?  Cough improved but still some chest tightness and fatigue and now dull headache/frontal  ? ?Suspect acute sinusitis s/p uri  ?tx with augmentin  ?Lung exam is reassuring- cxr ordered  ?Using symbicort  and ventolin prn for copd as well  ? ?LUL opacity/poss mass seen on cxr ?Will order CT scan of chest  ? ?  ?  ? Relevant Orders  ? DG Chest 2 View (Completed)  ?  ? Other  ? Abnormal chest x-ray  ?  Noted today in s

## 2021-05-13 DIAGNOSIS — Z780 Asymptomatic menopausal state: Secondary | ICD-10-CM | POA: Diagnosis not present

## 2021-05-13 DIAGNOSIS — Z1231 Encounter for screening mammogram for malignant neoplasm of breast: Secondary | ICD-10-CM | POA: Diagnosis not present

## 2021-05-13 DIAGNOSIS — M8589 Other specified disorders of bone density and structure, multiple sites: Secondary | ICD-10-CM | POA: Diagnosis not present

## 2021-05-13 LAB — HM DEXA SCAN

## 2021-05-13 LAB — HM MAMMOGRAPHY

## 2021-05-14 ENCOUNTER — Ambulatory Visit
Admission: RE | Admit: 2021-05-14 | Discharge: 2021-05-14 | Disposition: A | Discharge status: Home / Self Care | Payer: Medicare Other | Source: Ambulatory Visit | Attending: Family Medicine | Admitting: Family Medicine

## 2021-05-14 ENCOUNTER — Other Ambulatory Visit: Payer: Self-pay | Admitting: Family Medicine

## 2021-05-14 ENCOUNTER — Telehealth: Payer: Self-pay | Admitting: Family Medicine

## 2021-05-14 DIAGNOSIS — R911 Solitary pulmonary nodule: Secondary | ICD-10-CM

## 2021-05-14 DIAGNOSIS — J439 Emphysema, unspecified: Secondary | ICD-10-CM | POA: Diagnosis not present

## 2021-05-14 DIAGNOSIS — R918 Other nonspecific abnormal finding of lung field: Secondary | ICD-10-CM

## 2021-05-14 NOTE — Telephone Encounter (Signed)
Spoke to pt by phone  ?She feels about the same/ more tired than anything  ? ?Will place pulmonary and oncology referrals now  ?Sent teams message to ref coordinator  ? ?Notable h/o smoking and also breast and ovarian cancer  ? ?She prefers [facility]  ? ? ?

## 2021-05-19 ENCOUNTER — Encounter: Payer: Self-pay | Admitting: *Deleted

## 2021-05-19 DIAGNOSIS — R918 Other nonspecific abnormal finding of lung field: Secondary | ICD-10-CM

## 2021-05-20 ENCOUNTER — Encounter: Payer: Self-pay | Admitting: Family Medicine

## 2021-05-20 ENCOUNTER — Inpatient Hospital Stay: Payer: Medicare Other | Attending: Oncology | Admitting: Oncology

## 2021-05-20 ENCOUNTER — Encounter: Payer: Self-pay | Admitting: *Deleted

## 2021-05-20 ENCOUNTER — Inpatient Hospital Stay: Payer: Medicare Other

## 2021-05-20 ENCOUNTER — Encounter: Payer: Self-pay | Admitting: Oncology

## 2021-05-20 VITALS — BP 134/67 | HR 55 | Temp 98.2°F | Resp 16 | Ht 60.0 in | Wt 132.8 lb

## 2021-05-20 DIAGNOSIS — R918 Other nonspecific abnormal finding of lung field: Secondary | ICD-10-CM | POA: Diagnosis not present

## 2021-05-20 DIAGNOSIS — E041 Nontoxic single thyroid nodule: Secondary | ICD-10-CM | POA: Diagnosis not present

## 2021-05-20 DIAGNOSIS — Z85820 Personal history of malignant melanoma of skin: Secondary | ICD-10-CM | POA: Insufficient documentation

## 2021-05-20 DIAGNOSIS — Z853 Personal history of malignant neoplasm of breast: Secondary | ICD-10-CM | POA: Insufficient documentation

## 2021-05-20 DIAGNOSIS — Z86000 Personal history of in-situ neoplasm of breast: Secondary | ICD-10-CM

## 2021-05-20 DIAGNOSIS — Z8543 Personal history of malignant neoplasm of ovary: Secondary | ICD-10-CM | POA: Insufficient documentation

## 2021-05-20 LAB — CBC WITH DIFFERENTIAL/PLATELET
Abs Immature Granulocytes: 0.04 10*3/uL (ref 0.00–0.07)
Basophils Absolute: 0 10*3/uL (ref 0.0–0.1)
Basophils Relative: 0 %
Eosinophils Absolute: 0.2 10*3/uL (ref 0.0–0.5)
Eosinophils Relative: 2 %
HCT: 33.9 % — ABNORMAL LOW (ref 36.0–46.0)
Hemoglobin: 11 g/dL — ABNORMAL LOW (ref 12.0–15.0)
Immature Granulocytes: 0 %
Lymphocytes Relative: 23 %
Lymphs Abs: 2.1 10*3/uL (ref 0.7–4.0)
MCH: 27 pg (ref 26.0–34.0)
MCHC: 32.4 g/dL (ref 30.0–36.0)
MCV: 83.3 fL (ref 80.0–100.0)
Monocytes Absolute: 0.7 10*3/uL (ref 0.1–1.0)
Monocytes Relative: 7 %
Neutro Abs: 6.2 10*3/uL (ref 1.7–7.7)
Neutrophils Relative %: 68 %
Platelets: 300 10*3/uL (ref 150–400)
RBC: 4.07 MIL/uL (ref 3.87–5.11)
RDW: 13.6 % (ref 11.5–15.5)
WBC: 9.3 10*3/uL (ref 4.0–10.5)
nRBC: 0 % (ref 0.0–0.2)

## 2021-05-20 LAB — COMPREHENSIVE METABOLIC PANEL
ALT: 15 U/L (ref 0–44)
AST: 18 U/L (ref 15–41)
Albumin: 3.8 g/dL (ref 3.5–5.0)
Alkaline Phosphatase: 70 U/L (ref 38–126)
Anion gap: 7 (ref 5–15)
BUN: 24 mg/dL — ABNORMAL HIGH (ref 8–23)
CO2: 27 mmol/L (ref 22–32)
Calcium: 9.2 mg/dL (ref 8.9–10.3)
Chloride: 103 mmol/L (ref 98–111)
Creatinine, Ser: 1.37 mg/dL — ABNORMAL HIGH (ref 0.44–1.00)
GFR, Estimated: 39 mL/min — ABNORMAL LOW (ref >60)
Glucose, Bld: 103 mg/dL — ABNORMAL HIGH (ref 70–99)
Potassium: 4.4 mmol/L (ref 3.5–5.1)
Sodium: 137 mmol/L (ref 135–145)
Total Bilirubin: 0.4 mg/dL (ref 0.3–1.2)
Total Protein: 7.8 g/dL (ref 6.5–8.1)

## 2021-05-20 NOTE — Progress Notes (Signed)
Met with patient during initial consult with Dr. [name] to discuss recent CT results and next steps. All questions answered during visit. Reviewed upcoming appts. Contact info given and instructed to call with any questions or needs. Pt verbalized understanding.

## 2021-05-20 NOTE — Progress Notes (Signed)
[facility]  [phone] [phone]  ID: Pamela Morales OB: Apr 12, 1940  MR#: [id number]  KDQ#:[id number]  Patient Care Team: Judy Pimple, MD as PCP - Juanna Cao, MD as Consulting Physician (Dermatology) Glenford Peers, OD as Consulting Physician (Optometry) Claud Kelp, MD as Consulting Physician (General Surgery) Phil Dopp, [facility] as Pharmacist (Pharmacist) Glory Buff, RN as Oncology Nurse Navigator Orlie Dakin, Tollie Pizza, MD as Consulting Physician (Oncology)  CHIEF COMPLAINT: Left upper lobe lung mass.  INTERVAL HISTORY: Patient is an 81 year old female who noted a persistent cough and shortness of breath that did not resolve with antibiotics.  Subsequent imaging revealed a left upper lobe lung mass highly suspicious for malignancy.  She currently feels well and is at her baseline.  She has no neurologic complaints.  She denies any fevers.  She has a good appetite and denies weight loss.  She has no chest pain, shortness of breath, cough, or hemoptysis.  She denies any nausea, vomiting, constipation, or diarrhea.  She has no urinary complaints.  Patient feels at her baseline and offers no specific complaints today.  REVIEW OF SYSTEMS:   Review of Systems  Constitutional: Negative.  Negative for fever, malaise/fatigue and weight loss.  Respiratory: Negative.  Negative for cough and shortness of breath.   Cardiovascular: Negative.  Negative for chest pain and leg swelling.  Gastrointestinal: Negative.  Negative for abdominal pain.  Genitourinary: Negative.  Negative for dysuria.  Musculoskeletal: Negative.  Negative for back pain.  Skin: Negative.  Negative for rash.  Neurological: Negative.  Negative for dizziness, focal weakness, weakness and headaches.  Psychiatric/Behavioral: Negative.  The patient is not nervous/anxious.    As per HPI. Otherwise, a complete review of systems is negative.  PAST MEDICAL HISTORY: Past  Medical History:  Diagnosis Date   Allergy    Anemia    PAST HX    Breast cancer (HCC)    CA - cancer of ovary    Cancer (HCC) 02/2008   R breast  (est rec neg and brac neg)   Cataract    FORMING    COPD (chronic obstructive pulmonary disease) (HCC)    DDD (degenerative disc disease)    in back with chronic pain   Depression    Diverticulosis    Eczema    Hypertension    Osteopenia    Rosacea    Zoster 02/2007    PAST SURGICAL HISTORY: Past Surgical History:  Procedure Laterality Date   ABDOMINAL HYSTERECTOMY  1996   BSO fibroids, incidental ovarian CA finding   BASAL CELL CARCINOMA EXCISION  09/2016   bladder tack     BREAST SURGERY     rt total mastectomy for breast CA   CHOLECYSTECTOMY  08/1996   10 yrs ago   COLONOSCOPY     lipoma removal  11/07   POLYPECTOMY      FAMILY HISTORY: Family History  Problem Relation Age of Onset   Cancer Mother        stomach   Hypertension Mother    Heart disease Mother        CAD   Stomach cancer Mother    Cancer Father        lung   Cancer Sister        colon, skin   Heart disease Sister 83       MI   Diabetes Sister    Colon cancer Sister 27   Heart disease Brother  HTN and MI   Hypertension Brother    Colon cancer Daughter 20   Esophageal cancer Neg Hx    Colon polyps Neg Hx     ADVANCED DIRECTIVES (Y/N):  N  HEALTH MAINTENANCE: Social History   Tobacco Use   Smoking status: Former    Types: Cigarettes    Quit date: 01/03/1990    Years since quitting: 31.3   Smokeless tobacco: Never  Vaping Use   Vaping Use: Never used  Substance Use Topics   Alcohol use: Yes    Alcohol/week: 7.0 standard drinks    Types: 5 Glasses of wine, 2 Standard drinks or equivalent per week   Drug use: No     Colonoscopy:  PAP:  Bone density:  Lipid panel:  Allergies  Allergen Reactions   Sulfa Antibiotics     Current Outpatient Medications  Medication Sig Dispense Refill   albuterol (VENTOLIN HFA) 108 (90  Base) MCG/ACT inhaler Inhale 2 puffs into the lungs every 4 (four) hours as needed for wheezing. 1 each 3   amLODipine (NORVASC) 10 MG tablet TAKE 1 TABLET BY MOUTH EVERY DAY 90 tablet 1   budesonide-formoterol (SYMBICORT) 80-4.5 MCG/ACT inhaler Inhale 2 puffs into the lungs 2 (two) times daily. 1 each 0   Calcium Carbonate-Vitamin D (CALTRATE 600+D PO) Take 1,200 mg by mouth every evening.      Cyanocobalamin (B-12 PO) Take 5,000 mg by mouth once a week. (B-12)     Doxylamine Succinate, Sleep, (UNISOM PO) Take 50 mg by mouth at bedtime.     Ferrous Sulfate (IRON) 325 (65 Fe) MG TABS Take 1 tablet by mouth daily.     FLUoxetine (PROZAC) 20 MG capsule TAKE 2 CAPSULES BY MOUTH EVERY DAY 180 capsule 1   losartan (COZAAR) 50 MG tablet TAKE 1 TABLET BY MOUTH EVERY DAY 90 tablet 1   MAGNESIUM PO Take 500 mg by mouth daily as needed (only takes when working out, for muscle cramps).      MELATONIN PO Take 10 mg by mouth at bedtime.     Multiple Vitamin (MULTIVITAMIN) tablet Take 1 tablet by mouth daily.     Multiple Vitamins-Minerals (PRESERVISION AREDS 2 PO) Take 1 capsule by mouth daily.     Secukinumab, 300 MG Dose, (COSENTYX, 300 MG DOSE,) 150 MG/ML SOSY Inject 1 application as directed every 30 (thirty) days.     amoxicillin-clavulanate (AUGMENTIN) 875-125 MG tablet Take 1 tablet by mouth 2 (two) times daily. (Patient not taking: Reported on 05/20/2021) 14 tablet 0   No current facility-administered medications for this visit.    OBJECTIVE: Vitals:   05/20/21 1121  BP: 134/67  Pulse: (!) 55  Resp: 16  Temp: 98.2 F (36.8 C)  SpO2: 96%     Body mass index is 25.94 kg/m.    ECOG FS:0 - Asymptomatic  General: Well-developed, well-nourished, no acute distress. Eyes: Pink conjunctiva, anicteric sclera. HEENT: Normocephalic, moist mucous membranes. Lungs: No audible wheezing or coughing. Heart: Regular rate and rhythm. Abdomen: Soft, nontender, no obvious distention. Musculoskeletal: No  edema, cyanosis, or clubbing. Neuro: Alert, answering all questions appropriately. Cranial nerves grossly intact. Skin: No rashes or petechiae noted. Psych: Normal affect. Lymphatics: No cervical, calvicular, axillary or inguinal LAD.   LAB RESULTS:  Lab Results  Component Value Date   NA 140 10/22/2020   K 4.2 10/22/2020   CL 104 10/22/2020   CO2 29 10/22/2020   GLUCOSE 88 10/22/2020   BUN 23 10/22/2020   CREATININE  1.28 (H) 10/22/2020   CALCIUM 9.6 10/22/2020   PROT 6.9 06/18/2020   ALBUMIN 4.1 06/18/2020   AST 19 06/18/2020   ALT 12 06/18/2020   ALKPHOS 65 06/18/2020   BILITOT 0.4 06/18/2020   GFRNONAA 46 (L) 05/07/2008   GFRAA (L) 05/07/2008    56        The eGFR has been calculated using the MDRD equation. This calculation has not been validated in all clinical situations. eGFR's persistently <60 mL/min signify possible Chronic Kidney Disease.    Lab Results  Component Value Date   WBC 9.0 06/18/2020   NEUTROABS 5.6 06/18/2020   HGB 11.8 (L) 06/18/2020   HCT 35.0 (L) 06/18/2020   MCV 82.3 06/18/2020   PLT 235.0 06/18/2020     STUDIES: DG Chest 2 View  Result Date: 05/11/2021 CLINICAL DATA:  Ongoing weakness, chest tightness, bronchitis, COPD, shortness of breath, low energy; history RIGHT breast cancer, ovarian cancer, COPD, hypertension EXAM: CHEST - 2 VIEW COMPARISON:  06/18/2020 FINDINGS: Normal heart size, mediastinal contours, and pulmonary vascularity. Atherosclerotic calcification aorta. New nodular opacity LEFT upper lobe 2.8 x 2.8 cm question pulmonary mass. Bronchitic changes without pulmonary infiltrate, pleural effusion, or pneumothorax. Surgical clips RIGHT axilla, RIGHT breast/chest wall, and RIGHT upper quadrant. No acute osseous findings. IMPRESSION: New 2.8 x 2.8 cm diameter density LEFT upper lobe question pulmonary mass; CT chest recommended for further evaluation. Bronchitic changes. Emphysema (ICD10-J43.9). These results will be called to  the ordering clinician or representative by the Radiologist Assistant, and communication documented in the PACS or [employer]. Electronically Signed   By: Lavonia Dana M.D.   On: 05/11/2021 12:57   CT Chest Wo Contrast  Result Date: 05/14/2021 CLINICAL DATA:  Questionable left upper lobe pulmonary nodule. EXAM: CT CHEST WITHOUT CONTRAST TECHNIQUE: Multidetector CT imaging of the chest was performed following the standard protocol without IV contrast. RADIATION DOSE REDUCTION: This exam was performed according to the departmental dose-optimization program which includes automated exposure control, adjustment of the mA and/or kV according to patient size and/or use of iterative reconstruction technique. COMPARISON:  Chest radiograph-05/11/2021 FINDINGS: Cardiovascular: Borderline cardiomegaly. There is mild diffuse decreased attenuation of the intra cardiac blood pool suggestive of anemia. Small pericardial effusion, likely physiologic. Scattered atherosclerotic plaque within a normal caliber thoracic aorta. Enlarged caliber of the main pulmonary artery. Mediastinum/Nodes: Enlarged pretracheal lymph node measures 1.5 cm in greatest short axis diameter (image 46, series 2). Additional scattered mediastinal lymph nodes are numerous though individually not enlarged by size criteria. Borderline enlarged left-sided subpectoral lymph node measures 0.9 cm in greatest short axis diameter (image 18, series 2). No axillary lymphadenopathy. Lungs/Pleura: There is an approximately 3.1 x 2.7 x 2.2 cm somewhat spiculated left upper lobe mass (axial image 26, series 3; coronal image 104, series 5), correlating with the abnormality seen on preceding chest radiograph. The mass appears to extend to abut the adjacent pleura (best seen on coronal image 106, series 5). A left apical bronchus appears to extend directly towards the nodule (axial image 29, series 3; coronal image 102, series 5). Ill-defined macrolobulated nodular  opacity involving the subpleural aspect of the right lower lobe measures approximately 1.8 x 1.6 cm (image 84, series 3) as well as a more well-defined punctate (approximate 0.7 cm) right lower lobe subpleural nodule (image 84, series 3). Note is made of several additional ill-defined subpleural ground-glass opacities within the lungs bilaterally (representative images 90 and 93, series 3). Moderate apical predominant centrilobular emphysematous change. Minimal  grossly symmetric biapical pleuroparenchymal thickening. No pleural effusion or pneumothorax. The central pulmonary airways appear widely patent. Upper Abdomen: Atherosclerotic plaque within the abdominal aorta. Post cholecystectomy. Musculoskeletal: No acute or aggressive osseous abnormalities. Stigmata of dish within midthoracic spine. Regional postoperative change of the right breast. Note is made an approximately 4.0 x 3.3 x 2.1 cm hypoattenuating nodule within the left lobe of the thyroid (coronal image 86, series 5; axial image 10, series 2). IMPRESSION: 1. Approximately 3.1 cm somewhat spiculated left upper lobe mass correlates with questioned nodule seen on preceding chest radiograph and while nonspecific is worrisome for a bronchogenic malignancy. This mass extends to abut the adjacent pleural surface and associated with an enlarged pretracheal and borderline enlarged left-sided retropectoral lymph node. Further evaluation with PET-CT is advised. 2. Additional subpleural predominant bilateral pulmonary nodules with dominant right lower lobe macrolobulated nodular opacity measuring 1.8 cm in diameter, potentially the sequela of infection and/or inflammation though metastatic disease is not excluded on the basis of this examination. Again, these could be further evaluated with PET-CT as indicated. 3. Note made of an approximately 4.0 cm left-sided thyroid nodule/mass. Further evaluation with dedicated thyroid ultrasound could be performed as indicated,  ideally obtained following the PET-CT (to evaluate for hypermetabolic activity associated with the nodule). 4. Borderline cardiomegaly with enlargement of the caliber the main pulmonary artery, nonspecific though could be seen in the setting of pulmonary arterial hypertension. 5. Postoperative change of the right breast. 6. Post cholecystectomy. 7. Aortic Atherosclerosis (ICD10-I70.0) and Emphysema (ICD10-J43.9). Electronically Signed   By: Sandi Mariscal M.D.   On: 05/14/2021 13:08   DG Shoulder Left  Result Date: 04/28/2021 CLINICAL DATA:  Left shoulder pain after fall EXAM: LEFT SHOULDER - 2+ VIEW COMPARISON:  06/18/2020 FINDINGS: Bones are osteopenic. Minor AC joint degenerative change with bony spurring and joint space loss. No acute left shoulder malalignment, fracture or acute osseous finding. Included left chest demonstrates aortic atherosclerosis. Degenerative changes noted throughout the spine. IMPRESSION: No acute abnormality.  Degenerative changes and osteopenia. Electronically Signed   By: Jerilynn Mages.  Shick M.D.   On: 04/28/2021 14:44    ASSESSMENT: Left upper lobe lung mass.  PLAN:    Left upper lobe lung mass: Highly suspicious for underlying malignancy.  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  CA 27-29 is only mildly elevated at 41.7.  CA-125 is within normal limits.  Despite this history, this is likely a 4th primary.  Patient states she had genetic testing, but this was nearly 20 years ago.  Referral has been sent back to genetics for consideration of repeat testing.  She has a PET scan scheduled next week for further evaluation.  Once PET is completed, she will need a biopsy to confirm diagnosis.  Patient will then return to clinic 1 to 2 days after her PET scan to discuss the results and additional diagnostic planning. History of breast cancer: Greater than 15 years ago.  Patient states she underwent  mastectomy, but did not receive any XRT, chemotherapy, or hormonal treatment. History of ovarian cancer: Patient reports this was an incidental on pathology after having a total hysterectomy. History of melanoma: Unclear depth or stage. Right lower lobe pulmonary nodule: PET scan as above. Thyroid nodule: PET scan as above.  Patient will likely need a thyroid ultrasound to further identify.  Patient expressed understanding and was in agreement with this plan. She also  understands that She can call clinic at any time with any questions, concerns, or complaints.    Cancer Staging  No matching staging information was found for the patient.  Lloyd Huger, MD   05/20/2021 11:55 AM

## 2021-05-20 NOTE — Progress Notes (Signed)
Orders placed for PET scan. Scheduled for 5/24 at 1230. Will notify pt at her new pt visit scheduled on 5/18.

## 2021-05-21 LAB — CANCER ANTIGEN 27.29: CA 27.29: 41.7 U/mL — ABNORMAL HIGH (ref 0.0–38.6)

## 2021-05-21 LAB — CA 125: Cancer Antigen (CA) 125: 19.9 U/mL (ref 0.0–38.1)

## 2021-05-23 NOTE — Progress Notes (Signed)
Pamela Morales  [phone]) [phone] [phone]) [phone]  ID: [name] OB: [birth year]  MR#: [id number]  ZTI#:[id number]  Patient Care Team: Judy Pimple, MD as PCP - Juanna Cao, MD as Consulting Physician (Dermatology) Glenford Peers, OD as Consulting Physician (Optometry) Claud Kelp, MD as Consulting Physician (General Surgery) Phil Dopp, [facility] as Pharmacist (Pharmacist) Glory Buff, RN as Oncology Nurse Navigator Orlie Dakin, Tollie Pizza, MD as Consulting Physician (Oncology)  CHIEF COMPLAINT: Left upper lobe lung mass.  INTERVAL HISTORY: Patient returns to clinic today for further evaluation and discussion of her PET scan results.  She continues to feel well and remains asymptomatic.  She has no neurologic complaints.  She denies any fevers.  She has a good appetite and denies weight loss.  She has no chest pain, shortness of breath, cough, or hemoptysis.  She denies any nausea, vomiting, constipation, or diarrhea.  She has no urinary complaints.  Patient offers no specific complaints today.  REVIEW OF SYSTEMS:   Review of Systems  Constitutional: Negative.  Negative for fever, malaise/fatigue and weight loss.  Respiratory: Negative.  Negative for cough and shortness of breath.   Cardiovascular: Negative.  Negative for chest pain and leg swelling.  Gastrointestinal: Negative.  Negative for abdominal pain.  Genitourinary: Negative.  Negative for dysuria.  Musculoskeletal: Negative.  Negative for back pain.  Skin: Negative.  Negative for rash.  Neurological: Negative.  Negative for dizziness, focal weakness, weakness and headaches.  Psychiatric/Behavioral: Negative.  The patient is not nervous/anxious.    As per HPI. Otherwise, a complete review of systems is negative.  PAST MEDICAL HISTORY: Past Medical History:  Diagnosis Date   Allergy    Anemia    PAST HX    Breast cancer (HCC)    CA - cancer of ovary    Cancer (HCC) 02/2008    R breast  (est rec neg and brac neg)   Cataract    FORMING    COPD (chronic obstructive pulmonary disease) (HCC)    DDD (degenerative disc disease)    in back with chronic pain   Depression    Diverticulosis    Eczema    Hypertension    Osteopenia    Rosacea    Zoster 02/2007    PAST SURGICAL HISTORY: Past Surgical History:  Procedure Laterality Date   ABDOMINAL HYSTERECTOMY  1996   BSO fibroids, incidental ovarian CA finding   BASAL CELL CARCINOMA EXCISION  09/2016   bladder tack     BREAST SURGERY     rt total mastectomy for breast CA   CHOLECYSTECTOMY  08/1996   10 yrs ago   COLONOSCOPY     lipoma removal  11/07   POLYPECTOMY      FAMILY HISTORY: Family History  Problem Relation Age of Onset   Cancer Mother        stomach   Hypertension Mother    Heart disease Mother        CAD   Stomach cancer Mother    Cancer Father        lung   Cancer Sister        colon, skin   Heart disease Sister 7       MI   Diabetes Sister    Colon cancer Sister 26   Heart disease Brother        HTN and MI   Hypertension Brother    Colon cancer Daughter 42   Esophageal cancer Neg Hx  Colon polyps Neg Hx     ADVANCED DIRECTIVES (Y/N):  N  HEALTH MAINTENANCE: Social History   Tobacco Use   Smoking status: Former    Types: Cigarettes    Quit date: 01/03/1990    Years since quitting: 31.4   Smokeless tobacco: Never  Vaping Use   Vaping Use: Never used  Substance Use Topics   Alcohol use: Yes    Alcohol/week: 7.0 standard drinks    Types: 5 Glasses of wine, 2 Standard drinks or equivalent per week   Drug use: No     Colonoscopy:  PAP:  Bone density:  Lipid panel:  Allergies  Allergen Reactions   Sulfa Antibiotics     Current Outpatient Medications  Medication Sig Dispense Refill   albuterol (VENTOLIN HFA) 108 (90 Base) MCG/ACT inhaler Inhale 2 puffs into the lungs every 4 (four) hours as needed for wheezing. 1 each 3   amLODipine (NORVASC) 10 MG tablet  TAKE 1 TABLET BY MOUTH EVERY DAY 90 tablet 1   budesonide-formoterol (SYMBICORT) 80-4.5 MCG/ACT inhaler Inhale 2 puffs into the lungs 2 (two) times daily. 1 each 0   Calcium Carbonate-Vitamin D (CALTRATE 600+D PO) Take 1,200 mg by mouth every evening.      Cyanocobalamin (B-12 PO) Take 5,000 mg by mouth once a week. (B-12)     Doxylamine Succinate, Sleep, (UNISOM PO) Take 50 mg by mouth at bedtime.     Ferrous Sulfate (IRON) 325 (65 Fe) MG TABS Take 1 tablet by mouth daily.     FLUoxetine (PROZAC) 20 MG capsule TAKE 2 CAPSULES BY MOUTH EVERY DAY 180 capsule 1   losartan (COZAAR) 50 MG tablet TAKE 1 TABLET BY MOUTH EVERY DAY 90 tablet 1   MAGNESIUM PO Take 500 mg by mouth daily as needed (only takes when working out, for muscle cramps).      MELATONIN PO Take 10 mg by mouth at bedtime.     Multiple Vitamin (MULTIVITAMIN) tablet Take 1 tablet by mouth daily.     Multiple Vitamins-Minerals (PRESERVISION AREDS 2 PO) Take 1 capsule by mouth daily.     Secukinumab, 300 MG Dose, (COSENTYX, 300 MG DOSE,) 150 MG/ML SOSY Inject 1 application as directed every 30 (thirty) days.     amoxicillin-clavulanate (AUGMENTIN) 875-125 MG tablet Take 1 tablet by mouth 2 (two) times daily. (Patient not taking: Reported on 05/20/2021) 14 tablet 0   No current facility-administered medications for this visit.    OBJECTIVE: Vitals:   05/27/21 1410  BP: 132/70  Pulse: 60  Resp: 16  Temp: 98.1 F (36.7 C)  SpO2: 99%     Body mass index is 25.97 kg/m.    ECOG FS:0 - Asymptomatic  General: Well-developed, well-nourished, no acute distress. Eyes: Pink conjunctiva, anicteric sclera. HEENT: Normocephalic, moist mucous membranes. Lungs: No audible wheezing or coughing. Heart: Regular rate and rhythm. Abdomen: Soft, nontender, no obvious distention. Musculoskeletal: No edema, cyanosis, or clubbing. Neuro: Alert, answering all questions appropriately. Cranial nerves grossly intact. Skin: No rashes or petechiae  noted. Psych: Normal affect.  LAB RESULTS:  Lab Results  Component Value Date   NA 137 05/20/2021   K 4.4 05/20/2021   CL 103 05/20/2021   CO2 27 05/20/2021   GLUCOSE 103 (H) 05/20/2021   BUN 24 (H) 05/20/2021   CREATININE 1.37 (H) 05/20/2021   CALCIUM 9.2 05/20/2021   PROT 7.8 05/20/2021   ALBUMIN 3.8 05/20/2021   AST 18 05/20/2021   ALT 15 05/20/2021   ALKPHOS  70 05/20/2021   BILITOT 0.4 05/20/2021   GFRNONAA 39 (L) 05/20/2021   GFRAA (L) 05/07/2008    56        The eGFR has been calculated using the MDRD equation. This calculation has not been validated in all clinical situations. eGFR's persistently <60 mL/min signify possible Chronic Kidney Disease.    Lab Results  Component Value Date   WBC 9.3 05/20/2021   NEUTROABS 6.2 05/20/2021   HGB 11.0 (L) 05/20/2021   HCT 33.9 (L) 05/20/2021   MCV 83.3 05/20/2021   PLT 300 05/20/2021     STUDIES: DG Chest 2 View  Result Date: 05/11/2021 CLINICAL DATA:  Ongoing weakness, chest tightness, bronchitis, COPD, shortness of breath, low energy; history RIGHT breast cancer, ovarian cancer, COPD, hypertension EXAM: CHEST - 2 VIEW COMPARISON:  06/18/2020 FINDINGS: Normal heart size, mediastinal contours, and pulmonary vascularity. Atherosclerotic calcification aorta. New nodular opacity LEFT upper lobe 2.8 x 2.8 cm question pulmonary mass. Bronchitic changes without pulmonary infiltrate, pleural effusion, or pneumothorax. Surgical clips RIGHT axilla, RIGHT breast/chest wall, and RIGHT upper quadrant. No acute osseous findings. IMPRESSION: New 2.8 x 2.8 cm diameter density LEFT upper lobe question pulmonary mass; CT chest recommended for further evaluation. Bronchitic changes. Emphysema (ICD10-J43.9). These results will be called to the ordering clinician or representative by the Radiologist Assistant, and communication documented in the PACS or [employer]. Electronically Signed   By: Lavonia Dana M.D.   On: 05/11/2021 12:57    CT Chest Wo Contrast  Result Date: 05/14/2021 CLINICAL DATA:  Questionable left upper lobe pulmonary nodule. EXAM: CT CHEST WITHOUT CONTRAST TECHNIQUE: Multidetector CT imaging of the chest was performed following the standard protocol without IV contrast. RADIATION DOSE REDUCTION: This exam was performed according to the departmental dose-optimization program which includes automated exposure control, adjustment of the mA and/or kV according to patient size and/or use of iterative reconstruction technique. COMPARISON:  Chest radiograph-05/11/2021 FINDINGS: Cardiovascular: Borderline cardiomegaly. There is mild diffuse decreased attenuation of the intra cardiac blood pool suggestive of anemia. Small pericardial effusion, likely physiologic. Scattered atherosclerotic plaque within a normal caliber thoracic aorta. Enlarged caliber of the main pulmonary artery. Mediastinum/Nodes: Enlarged pretracheal lymph node measures 1.5 cm in greatest short axis diameter (image 46, series 2). Additional scattered mediastinal lymph nodes are numerous though individually not enlarged by size criteria. Borderline enlarged left-sided subpectoral lymph node measures 0.9 cm in greatest short axis diameter (image 18, series 2). No axillary lymphadenopathy. Lungs/Pleura: There is an approximately 3.1 x 2.7 x 2.2 cm somewhat spiculated left upper lobe mass (axial image 26, series 3; coronal image 104, series 5), correlating with the abnormality seen on preceding chest radiograph. The mass appears to extend to abut the adjacent pleura (best seen on coronal image 106, series 5). A left apical bronchus appears to extend directly towards the nodule (axial image 29, series 3; coronal image 102, series 5). Ill-defined macrolobulated nodular opacity involving the subpleural aspect of the right lower lobe measures approximately 1.8 x 1.6 cm (image 84, series 3) as well as a more well-defined punctate (approximate 0.7 cm) right lower lobe  subpleural nodule (image 84, series 3). Note is made of several additional ill-defined subpleural ground-glass opacities within the lungs bilaterally (representative images 90 and 93, series 3). Moderate apical predominant centrilobular emphysematous change. Minimal grossly symmetric biapical pleuroparenchymal thickening. No pleural effusion or pneumothorax. The central pulmonary airways appear widely patent. Upper Abdomen: Atherosclerotic plaque within the abdominal aorta. Post cholecystectomy. Musculoskeletal: No acute or  aggressive osseous abnormalities. Stigmata of dish within midthoracic spine. Regional postoperative change of the right breast. Note is made an approximately 4.0 x 3.3 x 2.1 cm hypoattenuating nodule within the left lobe of the thyroid (coronal image 86, series 5; axial image 10, series 2). IMPRESSION: 1. Approximately 3.1 cm somewhat spiculated left upper lobe mass correlates with questioned nodule seen on preceding chest radiograph and while nonspecific is worrisome for a bronchogenic malignancy. This mass extends to abut the adjacent pleural surface and associated with an enlarged pretracheal and borderline enlarged left-sided retropectoral lymph node. Further evaluation with PET-CT is advised. 2. Additional subpleural predominant bilateral pulmonary nodules with dominant right lower lobe macrolobulated nodular opacity measuring 1.8 cm in diameter, potentially the sequela of infection and/or inflammation though metastatic disease is not excluded on the basis of this examination. Again, these could be further evaluated with PET-CT as indicated. 3. Note made of an approximately 4.0 cm left-sided thyroid nodule/mass. Further evaluation with dedicated thyroid ultrasound could be performed as indicated, ideally obtained following the PET-CT (to evaluate for hypermetabolic activity associated with the nodule). 4. Borderline cardiomegaly with enlargement of the caliber the main pulmonary artery,  nonspecific though could be seen in the setting of pulmonary arterial hypertension. 5. Postoperative change of the right breast. 6. Post cholecystectomy. 7. Aortic Atherosclerosis (ICD10-I70.0) and Emphysema (ICD10-J43.9). Electronically Signed   By: Sandi Mariscal M.D.   On: 05/14/2021 13:08   DG Shoulder Left  Result Date: 04/28/2021 CLINICAL DATA:  Left shoulder pain after fall EXAM: LEFT SHOULDER - 2+ VIEW COMPARISON:  06/18/2020 FINDINGS: Bones are osteopenic. Minor AC joint degenerative change with bony spurring and joint space loss. No acute left shoulder malalignment, fracture or acute osseous finding. Included left chest demonstrates aortic atherosclerosis. Degenerative changes noted throughout the spine. IMPRESSION: No acute abnormality.  Degenerative changes and osteopenia. Electronically Signed   By: Jerilynn Mages.  Shick M.D.   On: 04/28/2021 14:44    ASSESSMENT: Left upper lobe lung mass.  PLAN:    Left upper lobe lung mass: Highly suspicious for underlying malignancy.  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  CA 27-29 is only mildly elevated at 41.7.  CA-125 is within normal limits.  Despite this history, this is likely a 4th primary.  Patient states she had genetic testing, but this was nearly 20 years ago.  Referral has been sent back to genetics for consideration of repeat testing.  PET scan results from May 26, 2021 are pending at time of dictation, but left upper lobe lung mass is clearly hypermetabolic.  Case has been discussed with pulmonary and have recommended EBUS for further evaluation and biopsy.  A referral has been placed.  Return to clinic 1 week after her biopsy to discuss the results and treatment planning.   History of breast cancer: Greater than 15 years ago.  Patient states she underwent mastectomy, but did not receive any XRT, chemotherapy, or hormonal treatment. History of ovarian cancer:  Patient reports this was an incidental on pathology after having a total hysterectomy. History of melanoma: Unclear depth or stage. Right lower lobe pulmonary nodule: PET scan as above. Thyroid nodule: PET scan as above.  Patient will likely need a thyroid ultrasound to further identify.  I spent a total of 30 minutes reviewing chart data, face-to-face evaluation with the patient, counseling and coordination of care as detailed above.   Patient expressed understanding  and was in agreement with this plan. She also understands that She can call clinic at any time with any questions, concerns, or complaints.    Cancer Staging  No matching staging information was found for the patient.  Lloyd Huger, MD   05/28/2021 9:29 AM

## 2021-05-26 ENCOUNTER — Encounter
Admission: RE | Admit: 2021-05-26 | Discharge: 2021-05-26 | Disposition: A | Discharge status: Home / Self Care | Payer: Medicare Other | Source: Ambulatory Visit | Attending: Oncology | Admitting: Oncology

## 2021-05-26 DIAGNOSIS — R918 Other nonspecific abnormal finding of lung field: Secondary | ICD-10-CM | POA: Diagnosis not present

## 2021-05-26 DIAGNOSIS — C3490 Malignant neoplasm of unspecified part of unspecified bronchus or lung: Secondary | ICD-10-CM | POA: Diagnosis not present

## 2021-05-26 DIAGNOSIS — I2510 Atherosclerotic heart disease of native coronary artery without angina pectoris: Secondary | ICD-10-CM | POA: Diagnosis not present

## 2021-05-26 DIAGNOSIS — Z853 Personal history of malignant neoplasm of breast: Secondary | ICD-10-CM | POA: Diagnosis not present

## 2021-05-26 MED ORDER — FLUDEOXYGLUCOSE F - 18 (FDG) INJECTION
6.8000 | Freq: Once | INTRAVENOUS | Status: AC | PRN
  Administered 2021-05-26: 7.24 via INTRAVENOUS

## 2021-05-27 ENCOUNTER — Inpatient Hospital Stay: Payer: Medicare Other | Admitting: Oncology

## 2021-05-27 ENCOUNTER — Encounter: Payer: Self-pay | Admitting: *Deleted

## 2021-05-27 ENCOUNTER — Encounter: Payer: Self-pay | Admitting: Oncology

## 2021-05-27 VITALS — BP 132/70 | HR 60 | Temp 98.1°F | Resp 16 | Ht 60.0 in | Wt 133.0 lb

## 2021-05-27 DIAGNOSIS — Z85820 Personal history of malignant melanoma of skin: Secondary | ICD-10-CM | POA: Diagnosis not present

## 2021-05-27 DIAGNOSIS — Z853 Personal history of malignant neoplasm of breast: Secondary | ICD-10-CM | POA: Diagnosis not present

## 2021-05-27 DIAGNOSIS — E041 Nontoxic single thyroid nodule: Secondary | ICD-10-CM | POA: Diagnosis not present

## 2021-05-27 DIAGNOSIS — R918 Other nonspecific abnormal finding of lung field: Secondary | ICD-10-CM

## 2021-05-28 LAB — GLUCOSE, CAPILLARY: Glucose-Capillary: 97 mg/dL (ref 70–99)

## 2021-06-02 ENCOUNTER — Telehealth: Payer: Self-pay

## 2021-06-02 NOTE — Telephone Encounter (Signed)
Per Lanice Shirts, RN-- patient needs consult to discuss possible bronch.  Lm to offer 06/04/2021 at 12:00.

## 2021-06-02 NOTE — Telephone Encounter (Signed)
Appt scheduled.  Nothing further needed.

## 2021-06-02 NOTE — Telephone Encounter (Signed)
Spoke to patient and offered 6/2/202 at 12:00. Patient agreed with appt.  Will schedule once slot has been opened. I have sent a request to [facility]

## 2021-06-03 ENCOUNTER — Institutional Professional Consult (permissible substitution): Payer: Medicare Other | Admitting: Pulmonary Disease

## 2021-06-04 ENCOUNTER — Telehealth: Payer: Self-pay | Admitting: Pulmonary Disease

## 2021-06-04 ENCOUNTER — Encounter: Payer: Self-pay | Admitting: Pulmonary Disease

## 2021-06-04 ENCOUNTER — Ambulatory Visit: Payer: Medicare Other | Admitting: Pulmonary Disease

## 2021-06-04 VITALS — BP 138/84 | HR 73 | Temp 98.1°F | Ht 60.0 in | Wt 132.4 lb

## 2021-06-04 DIAGNOSIS — Z8543 Personal history of malignant neoplasm of ovary: Secondary | ICD-10-CM

## 2021-06-04 DIAGNOSIS — J449 Chronic obstructive pulmonary disease, unspecified: Secondary | ICD-10-CM

## 2021-06-04 DIAGNOSIS — R918 Other nonspecific abnormal finding of lung field: Secondary | ICD-10-CM

## 2021-06-04 DIAGNOSIS — Z853 Personal history of malignant neoplasm of breast: Secondary | ICD-10-CM

## 2021-06-04 DIAGNOSIS — R911 Solitary pulmonary nodule: Secondary | ICD-10-CM

## 2021-06-04 MED ORDER — TRELEGY ELLIPTA 100-62.5-25 MCG/ACT IN AEPB: 1.0000 | INHALATION_SPRAY | Freq: Every day | RESPIRATORY_TRACT | 0 refills | Status: DC

## 2021-06-04 NOTE — Progress Notes (Signed)
Subjective:    Patient ID: Pamela Morales, female    DOB: [birth year], 81 y.o.   MRN: [id number] Patient Care Team: Glori Bickers, Wynelle Fanny, MD as PCP - Patsi Sears, MD as Consulting Physician (Dermatology) Webb Laws, [place] as Consulting Physician (Optometry) Fanny Skates, MD as Consulting Physician (General Surgery) Debbora Dus, [facility] as Pharmacist (Pharmacist) Telford Nab, RN as Oncology Nurse Navigator [name], Kathlene November, MD as Consulting Physician (Oncology)  Chief Complaint  Patient presents with   pulmonary consult    PET 05/28/21--   HPI This is an 81 year old former smoker who presents for evaluation of a left upper lobe mass for consideration of robotic bronchoscopy.  She is kindly referred by Dr. Delight Hoh.  Her primary care physician is Dr. Loura Pardon.  She had actually followed up at this clinic until 2019 she was initially seen by Dr. Laverle Hobby she was diagnosed with stage I COPD.  With regards to her COPD she is currently maintained on Symbicort and albuterol which she pretty much uses as needed (both of them).  She also uses Spiriva HandiHaler which she does take daily.  With regards to her recently noted left upper lobe mass, she noted to have issues with bronchitis towards the end of April and required prednisone and antibiotics.  Her cough persisted and a chest x-ray was performed on 9 May.  Noted an opacity on the left upper lobe.  Subsequently chest CT was performed on 12 May showed a 3.2 cm spiculated left upper lobe mass with enlarged pretracheal lymph node.  She then had PET/CT on 24 May showing that the mass had SUV of 20.6.  She had no other hypermetabolic pulmonary activity nor suspicious nodules.  There was no increased metabolism on the mediastinal lymph nodes.  The patient has had her cough issues resolved since that imaging.  She has not had any fevers, chills or sweats.  No productive cough nor hemoptysis.  She does not endorse any  weight loss or anorexia.  Overall she states she feels well.   The different potential biopsy techniques were discussed with the patient.  The patient opts for robotic assisted navigational bronchoscopy with EBUS standby for biopsy and evaluation and staging.  She understands the potential benefits, limitations and complications of the procedure and agrees to go ahead.   Review of Systems A 10 point review of systems was performed and it is as noted above otherwise negative.  Past Medical History:  Diagnosis Date   Allergy    Anemia    PAST HX    Breast cancer ([place])    CA - cancer of ovary    Cancer ([place]) 02/2008   R breast  (est rec neg and brac neg)   Cataract    FORMING    COPD (chronic obstructive pulmonary disease) (HCC)    DDD (degenerative disc disease)    in back with chronic pain   Depression    Diverticulosis    Eczema    Hypertension    Osteopenia    Rosacea    Zoster 02/2007   Past Surgical History:  Procedure Laterality Date   ABDOMINAL HYSTERECTOMY  1996   BSO fibroids, incidental ovarian CA finding   BASAL CELL CARCINOMA EXCISION  09/2016   bladder tack     BREAST SURGERY     rt total mastectomy for breast CA   CHOLECYSTECTOMY  08/1996   10 yrs ago   COLONOSCOPY     lipoma removal  11/07   POLYPECTOMY     Patient Active Problem List   Diagnosis Date Noted   Mass of left lung 05/14/2021   Abnormal chest x-ray 05/11/2021   Left shoulder pain 04/28/2021   CRI (chronic renal insufficiency) 10/23/2020   Acute bronchitis 05/25/2020   Left foot pain 01/27/2020   High triglycerides 07/16/2019   Laceration of right knee 12/10/2018   COPD (chronic obstructive pulmonary disease) ([place]) 07/12/2018   Elevated TSH 10/18/2016   Elevated serum creatinine 10/18/2016   Estrogen deficiency 03/23/2016   Encounter for screening mammogram for breast cancer 03/23/2016   Psoriatic arthritis ([place]) 03/31/2015   Routine general medical examination at a health care  facility 03/25/2015   Heartburn 03/03/2015   Fatigue 04/28/2014   Family history of colon cancer 01/01/2013   Personal history of colonic polyps 01/01/2013   Colon cancer screening 12/19/2012   Hearing loss 09/19/2012   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   Colon polyps 07/16/2010   BACK PAIN WITH RADICULOPATHY 03/15/2007   History of herpes zoster 02/23/2007   Essential hypertension 02/05/2007   Osteopenia 02/05/2007   ECZEMA, ATOPIC 01/29/2007   ROSACEA 01/29/2007   BASAL CELL CARCINOMA, HX OF 01/29/2007   Family History  Problem Relation Age of Onset   Cancer Mother        stomach   Hypertension Mother    Heart disease Mother        CAD   Stomach cancer Mother    Cancer Father        lung   Cancer Sister        colon, skin   Heart disease Sister 93       MI   Diabetes Sister    Colon cancer Sister 47   Heart disease Brother        HTN and MI   Hypertension Brother    Colon cancer Daughter 39   Esophageal cancer Neg Hx    Colon polyps Neg Hx    Social History   Tobacco Use   Smoking status: Former    Types: Cigarettes    Quit date: 01/03/1990    Years since quitting: 31.4   Smokeless tobacco: Never  Substance Use Topics   Alcohol use: Yes    Alcohol/week: 7.0 standard drinks    Types: 5 Glasses of wine, 2 Standard drinks or equivalent per week   Allergies  Allergen Reactions   Sulfa Antibiotics    Current Meds  Medication Sig   albuterol (VENTOLIN HFA) 108 (90 Base) MCG/ACT inhaler Inhale 2 puffs into the lungs every 4 (four) hours as needed for wheezing.   amLODipine (NORVASC) 10 MG tablet TAKE 1 TABLET BY MOUTH EVERY DAY   budesonide-formoterol (SYMBICORT) 80-4.5 MCG/ACT inhaler Inhale 2 puffs into the lungs 2 (two) times daily.   Calcium Carbonate-Vitamin D (CALTRATE 600+D PO) Take 1,200 mg by mouth every evening.    Cyanocobalamin (B-12 PO) Take 5,000 mg by mouth once a week. (B-12)   Doxylamine Succinate, Sleep, (UNISOM PO) Take 50 mg  by mouth at bedtime.   Ferrous Sulfate (IRON) 325 (65 Fe) MG TABS Take 1 tablet by mouth daily.   FLUoxetine (PROZAC) 20 MG capsule TAKE 2 CAPSULES BY MOUTH EVERY DAY   losartan (COZAAR) 50 MG tablet TAKE 1 TABLET BY MOUTH EVERY DAY   MAGNESIUM PO Take 500 mg by mouth daily as needed (only takes when working out, for muscle cramps).    MELATONIN PO Take  10 mg by mouth at bedtime.   Multiple Vitamin (MULTIVITAMIN) tablet Take 1 tablet by mouth daily.   Multiple Vitamins-Minerals (PRESERVISION AREDS 2 PO) Take 1 capsule by mouth daily.   Secukinumab, 300 MG Dose, (COSENTYX, 300 MG DOSE,) 150 MG/ML SOSY Inject 1 application as directed every 30 (thirty) days.   [DISCONTINUED] amoxicillin-clavulanate (AUGMENTIN) 875-125 MG tablet Take 1 tablet by mouth 2 (two) times daily.   Immunization History  Administered Date(s) Administered   Fluad Quad(high Dose 65+) 01/01/2020   Influenza Split 10/11/2011   Influenza Whole 10/04/2006   Influenza, High Dose Seasonal PF 10/12/2016, 10/30/2017, 10/04/2018, 01/01/2020   Influenza,inj,Quad PF,6+ Mos 09/19/2012, 10/31/2013, 10/10/2014   Influenza-Unspecified 09/18/2015   Moderna Sars-Covid-2 Vaccination 01/29/2019, 02/26/2019, 11/23/2019   Pneumococcal Conjugate-13 04/28/2014   Pneumococcal Polysaccharide-23 08/03/2012   Td 01/03/1994, 07/17/2008, 12/10/2018   Zoster Recombinat (Shingrix) 03/25/2021      Objective:   Physical Exam BP 138/84 (BP Location: Left Arm, Cuff Size: Normal)   Pulse 73   Temp 98.1 F (36.7 C) (Temporal)   Ht 5' (1.524 m)   Wt 132 lb 6.4 oz (60.1 kg)   SpO2 95%   BMI 25.86 kg/m  GENERAL: Well-developed well-nourished elderly woman, no acute distress.  Fully ambulatory, no conversational dyspnea.  Quite spry. HEAD: Normocephalic, atraumatic.  EYES: Pupils equal, round, reactive to light.  No scleral icterus.  MOUTH: Oral mucosa moist.  No thrush. NECK: Supple. No thyromegaly. Trachea midline. No JVD.  No  adenopathy. PULMONARY: Good air entry bilaterally.  No adventitious sounds. CARDIOVASCULAR: S1 and S2. Regular rate and rhythm.  No rubs, murmurs or gallops heard. ABDOMEN: Benign. MUSCULOSKELETAL: No joint deformity, no clubbing, no edema.  NEUROLOGIC: No overt focal deficit, no gait disturbance, speech is fluent. SKIN: Intact,warm,dry. PSYCH: Mood and behavior normal.  Representative image from the CT performed 14 May 2021 showing left upper lobe mass:   Representative image from PET/CT performed 26 May 2021:     Assessment & Plan:     ICD-10-CM   1. Mass of left lung  R91.8    Carcinoma until proven otherwise Will need robotic assisted navigational bronchoscopy Chest CT [facility] protocol EBUS on standby    2. Stage 1 mild COPD by GOLD classification ([place])  J44.9     3. History of breast cancer - RIGHT, 2010  Z85.3    Status postmastectomy High-grade ductal carcinoma No radiation or chemo necessary    4. History of ovarian cancer  Z85.43    Incidental finding after hysterectomy/BSO 1996 In situ     Meds ordered this encounter  Medications   Fluticasone-Umeclidin-Vilant (TRELEGY ELLIPTA) 100-62.5-25 MCG/ACT AEPB    Sig: Inhale 1 puff into the lungs daily.    Dispense:  28 each    Refill:  0    Order Specific Question:   Lot Number?    Answer:   [id number]    Order Specific Question:   Expiration Date?    Answer:   11/04/2022    Order Specific Question:   Manufacturer?    Answer:   Gl[employer]oSmithKline [[id number]]    Order Specific Question:   Quantity    Answer:   1   A [facility] protocol chest CT has been ordered prior to the procedure for mapping purposes.  To optimize her COPD and consolidate therapies she was given a sample of Trelegy Ellipta 100/62.5/25, 1 inhalation daily, rinse mouth well after use.  Discontinue Symbicort and Spiriva.  Benefits, limitations and potential complications of  the procedure were discussed with the patient/family.  Complications from bronchoscopy are  rare and most often minor, but if they occur they may include breathing difficulty, vocal cord spasm, hoarseness, slight fever, vomiting, dizziness, bronchospasm, infection, low blood oxygen, bleeding from biopsy site, or an allergic reaction to medications.  It is uncommon for patients to experience other more serious complications for example: Collapsed lung requiring chest tube placement, respiratory failure, heart attack and/or cardiac arrhythmia.  And agrees to proceed  For the purposes of the procedure tidal volume will be 360 mL, PEEP 8 -12, FiO2 40 to 70%.   Procedure has been scheduled for 14 June at 12:30 PM.  Renold Don, MD Advanced Bronchoscopy PCCM  [facility]    *This note was dictated using voice recognition software/Dragon.  Despite best efforts to proofread, errors can occur which can change the meaning. Any transcriptional errors that result from this process are unintentional and may not be fully corrected at the time of dictation.

## 2021-06-04 NOTE — Telephone Encounter (Signed)
Phone pre admit visit 06/10/2021 between 1-5 and covid test 06/14/2021 between 8-12.  Patient is aware of above dates/times and voiced her understanding.  Nothing further needed.

## 2021-06-04 NOTE — Telephone Encounter (Signed)
For the codes 31627, [id number], [id number], [place] Not Required  Refer # [id number]

## 2021-06-04 NOTE — Patient Instructions (Addendum)
We have scheduled your procedure for 14 June 12:30 PM  You will have another chest CT that is a special CT that will help [place] make a map of your airways so we can reach the spot in your lung to biopsy it.  You will be getting more instructions as to the preoperative process through the preadmission testing clinic.  We will see you in follow-up in 4 to 6 weeks time but we will be staying in contact with you after the procedure and I will stay in contact with Dr. [name] as well.  We are giving you a trial of Trelegy Ellipta 1 inhalation daily make sure you rinse your mouth well after you use it.  DO NOT USE SYMBICORT NOR SPIRIVA WHILE ON THE TRELEGY.    You may still use your albuterol (Ventolin/Proventil/ProAir) inhaler as needed.

## 2021-06-04 NOTE — Telephone Encounter (Signed)
Robotic bronchoscopy with navigation and cellizio scheduled 06/15/2021. EBUS on standby.  BD:HDIX mass CPT:31627,31899,31652,31653  Rodena Piety, please see bronch info.

## 2021-06-04 NOTE — H&P (View-Only) (Signed)
Subjective:    Patient ID: Pamela Morales, female    DOB: 08-21-1940, 81 y.o.   MRN: [id number] Patient Care Team: Glori Bickers, Wynelle Fanny, MD as PCP - Patsi Sears, MD as Consulting Physician (Dermatology) Webb Laws, [place] as Consulting Physician (Optometry) Fanny Skates, MD as Consulting Physician (General Surgery) Debbora Dus, [facility] as Pharmacist (Pharmacist) Telford Nab, RN as Oncology Nurse Navigator [name], Kathlene November, MD as Consulting Physician (Oncology)  Chief Complaint  Patient presents with   pulmonary consult    PET 05/28/21--   HPI This is an 89 year old former smoker who presents for evaluation of a left upper lobe mass for consideration of robotic bronchoscopy.  She is kindly referred by Dr. Delight Hoh.  Her primary care physician is Dr. Loura Pardon.  She had actually followed up at this clinic until 2019 she was initially seen by Dr. Laverle Hobby she was diagnosed with stage I COPD.  With regards to her COPD she is currently maintained on Symbicort and albuterol which she pretty much uses as needed (both of them).  She also uses Spiriva HandiHaler which she does take daily.  With regards to her recently noted left upper lobe mass, she noted to have issues with bronchitis towards the end of April and required prednisone and antibiotics.  Her cough persisted and a chest x-ray was performed on 9 May.  Noted an opacity on the left upper lobe.  Subsequently chest CT was performed on 12 May showed a 3.2 cm spiculated left upper lobe mass with enlarged pretracheal lymph node.  She then had PET/CT on 24 May showing that the mass had SUV of 20.6.  She had no other hypermetabolic pulmonary activity nor suspicious nodules.  There was no increased metabolism on the mediastinal lymph nodes.  The patient has had her cough issues resolved since that imaging.  She has not had any fevers, chills or sweats.  No productive cough nor hemoptysis.  She does not endorse any  weight loss or anorexia.  Overall she states she feels well.   The different potential biopsy techniques were discussed with the patient.  The patient opts for robotic assisted navigational bronchoscopy with EBUS standby for biopsy and evaluation and staging.  She understands the potential benefits, limitations and complications of the procedure and agrees to go ahead.   Review of Systems A 10 point review of systems was performed and it is as noted above otherwise negative.  Past Medical History:  Diagnosis Date   Allergy    Anemia    PAST HX    Breast cancer ([place])    CA - cancer of ovary    Cancer ([place]) 02/2008   R breast  (est rec neg and brac neg)   Cataract    FORMING    COPD (chronic obstructive pulmonary disease) (HCC)    DDD (degenerative disc disease)    in back with chronic pain   Depression    Diverticulosis    Eczema    Hypertension    Osteopenia    Rosacea    Zoster 02/2007   Past Surgical History:  Procedure Laterality Date   ABDOMINAL HYSTERECTOMY  1996   BSO fibroids, incidental ovarian CA finding   BASAL CELL CARCINOMA EXCISION  09/2016   bladder tack     BREAST SURGERY     rt total mastectomy for breast CA   CHOLECYSTECTOMY  08/1996   10 yrs ago   COLONOSCOPY     lipoma removal  11/07   POLYPECTOMY     Patient Active Problem List   Diagnosis Date Noted   Mass of left lung 05/14/2021   Abnormal chest x-ray 05/11/2021   Left shoulder pain 04/28/2021   CRI (chronic renal insufficiency) 10/23/2020   Acute bronchitis 05/25/2020   Left foot pain 01/27/2020   High triglycerides 07/16/2019   Laceration of right knee 12/10/2018   COPD (chronic obstructive pulmonary disease) ([place]) 07/12/2018   Elevated TSH 10/18/2016   Elevated serum creatinine 10/18/2016   Estrogen deficiency 03/23/2016   Encounter for screening mammogram for breast cancer 03/23/2016   Psoriatic arthritis ([place]) 03/31/2015   Routine general medical examination at a health care  facility 03/25/2015   Heartburn 03/03/2015   Fatigue 04/28/2014   Family history of colon cancer 01/01/2013   Personal history of colonic polyps 01/01/2013   Colon cancer screening 12/19/2012   Hearing loss 09/19/2012   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   Colon polyps 07/16/2010   BACK PAIN WITH RADICULOPATHY 03/15/2007   History of herpes zoster 02/23/2007   Essential hypertension 02/05/2007   Osteopenia 02/05/2007   ECZEMA, ATOPIC 01/29/2007   ROSACEA 01/29/2007   BASAL CELL CARCINOMA, HX OF 01/29/2007   Family History  Problem Relation Age of Onset   Cancer Mother        stomach   Hypertension Mother    Heart disease Mother        CAD   Stomach cancer Mother    Cancer Father        lung   Cancer Sister        colon, skin   Heart disease Sister 66       MI   Diabetes Sister    Colon cancer Sister 73   Heart disease Brother        HTN and MI   Hypertension Brother    Colon cancer Daughter 58   Esophageal cancer Neg Hx    Colon polyps Neg Hx    Social History   Tobacco Use   Smoking status: Former    Types: Cigarettes    Quit date: 01/03/1990    Years since quitting: 31.4   Smokeless tobacco: Never  Substance Use Topics   Alcohol use: Yes    Alcohol/week: 7.0 standard drinks    Types: 5 Glasses of wine, 2 Standard drinks or equivalent per week   Allergies  Allergen Reactions   Sulfa Antibiotics    Current Meds  Medication Sig   albuterol (VENTOLIN HFA) 108 (90 Base) MCG/ACT inhaler Inhale 2 puffs into the lungs every 4 (four) hours as needed for wheezing.   amLODipine (NORVASC) 10 MG tablet TAKE 1 TABLET BY MOUTH EVERY DAY   budesonide-formoterol (SYMBICORT) 80-4.5 MCG/ACT inhaler Inhale 2 puffs into the lungs 2 (two) times daily.   Calcium Carbonate-Vitamin D (CALTRATE 600+D PO) Take 1,200 mg by mouth every evening.    Cyanocobalamin (B-12 PO) Take 5,000 mg by mouth once a week. (B-12)   Doxylamine Succinate, Sleep, (UNISOM PO) Take 50 mg  by mouth at bedtime.   Ferrous Sulfate (IRON) 325 (65 Fe) MG TABS Take 1 tablet by mouth daily.   FLUoxetine (PROZAC) 20 MG capsule TAKE 2 CAPSULES BY MOUTH EVERY DAY   losartan (COZAAR) 50 MG tablet TAKE 1 TABLET BY MOUTH EVERY DAY   MAGNESIUM PO Take 500 mg by mouth daily as needed (only takes when working out, for muscle cramps).    MELATONIN PO Take  10 mg by mouth at bedtime.   Multiple Vitamin (MULTIVITAMIN) tablet Take 1 tablet by mouth daily.   Multiple Vitamins-Minerals (PRESERVISION AREDS 2 PO) Take 1 capsule by mouth daily.   Secukinumab, 300 MG Dose, (COSENTYX, 300 MG DOSE,) 150 MG/ML SOSY Inject 1 application as directed every 30 (thirty) days.   [DISCONTINUED] amoxicillin-clavulanate (AUGMENTIN) 875-125 MG tablet Take 1 tablet by mouth 2 (two) times daily.   Immunization History  Administered Date(s) Administered   Fluad Quad(high Dose 65+) 01/01/2020   Influenza Split 10/11/2011   Influenza Whole 10/04/2006   Influenza, High Dose Seasonal PF 10/12/2016, 10/30/2017, 10/04/2018, 01/01/2020   Influenza,inj,Quad PF,6+ Mos 09/19/2012, 10/31/2013, 10/10/2014   Influenza-Unspecified 09/18/2015   Moderna Sars-Covid-2 Vaccination 01/29/2019, 02/26/2019, 11/23/2019   Pneumococcal Conjugate-13 04/28/2014   Pneumococcal Polysaccharide-23 08/03/2012   Td 01/03/1994, 07/17/2008, 12/10/2018   Zoster Recombinat (Shingrix) 03/25/2021      Objective:   Physical Exam BP 138/84 (BP Location: Left Arm, Cuff Size: Normal)   Pulse 73   Temp 98.1 F (36.7 C) (Temporal)   Ht 5' (1.524 m)   Wt 132 lb 6.4 oz (60.1 kg)   SpO2 95%   BMI 25.86 kg/m  GENERAL: Well-developed well-nourished elderly woman, no acute distress.  Fully ambulatory, no conversational dyspnea.  Quite spry. HEAD: Normocephalic, atraumatic.  EYES: Pupils equal, round, reactive to light.  No scleral icterus.  MOUTH: Oral mucosa moist.  No thrush. NECK: Supple. No thyromegaly. Trachea midline. No JVD.  No  adenopathy. PULMONARY: Good air entry bilaterally.  No adventitious sounds. CARDIOVASCULAR: S1 and S2. Regular rate and rhythm.  No rubs, murmurs or gallops heard. ABDOMEN: Benign. MUSCULOSKELETAL: No joint deformity, no clubbing, no edema.  NEUROLOGIC: No overt focal deficit, no gait disturbance, speech is fluent. SKIN: Intact,warm,dry. PSYCH: Mood and behavior normal.  Representative image from the CT performed 14 May 2021 showing left upper lobe mass:   Representative image from PET/CT performed 26 May 2021:     Assessment & Plan:     ICD-10-CM   1. Mass of left lung  R91.8    Carcinoma until proven otherwise Will need robotic assisted navigational bronchoscopy Chest CT [facility] protocol EBUS on standby    2. Stage 1 mild COPD by GOLD classification ([place])  J44.9     3. History of breast cancer - RIGHT, 2010  Z85.3    Status postmastectomy High-grade ductal carcinoma No radiation or chemo necessary    4. History of ovarian cancer  Z85.43    Incidental finding after hysterectomy/BSO 1996 In situ     Meds ordered this encounter  Medications   Fluticasone-Umeclidin-Vilant (TRELEGY ELLIPTA) 100-62.5-25 MCG/ACT AEPB    Sig: Inhale 1 puff into the lungs daily.    Dispense:  28 each    Refill:  0    Order Specific Question:   Lot Number?    Answer:   [id number]    Order Specific Question:   Expiration Date?    Answer:   11/04/2022    Order Specific Question:   Manufacturer?    Answer:   Gl[employer]oSmithK[employer] [12]    Order Specific Question:   Quantity    Answer:   1   A [facility] protocol chest CT has been ordered prior to the procedure for mapping purposes.  To optimize her COPD and consolidate therapies she was given a sample of Trelegy Ellipta 100/62.5/25, 1 inhalation daily, rinse mouth well after use.  Discontinue Symbicort and Spiriva.  Benefits, limitations and potential complications of  the procedure were discussed with the patient/family.  Complications from bronchoscopy are  rare and most often minor, but if they occur they may include breathing difficulty, vocal cord spasm, hoarseness, slight fever, vomiting, dizziness, bronchospasm, infection, low blood oxygen, bleeding from biopsy site, or an allergic reaction to medications.  It is uncommon for patients to experience other more serious complications for example: Collapsed lung requiring chest tube placement, respiratory failure, heart attack and/or cardiac arrhythmia.  And agrees to proceed  For the purposes of the procedure tidal volume will be 360 mL, PEEP 8 -12, FiO2 40 to 70%.   Procedure has been scheduled for 14 June at 12:30 PM.  Renold Don, MD Advanced Bronchoscopy PCCM [facility]    *This note was dictated using voice recognition software/Dragon.  Despite best efforts to proofread, errors can occur which can change the meaning. Any transcriptional errors that result from this process are unintentional and may not be fully corrected at the time of dictation.

## 2021-06-06 ENCOUNTER — Emergency Department (HOSPITAL_BASED_OUTPATIENT_CLINIC_OR_DEPARTMENT_OTHER)
Admission: EM | Admit: 2021-06-06 | Discharge: 2021-06-06 | Disposition: A | Discharge status: Home / Self Care | Payer: Medicare Other | Attending: Emergency Medicine | Admitting: Emergency Medicine

## 2021-06-06 ENCOUNTER — Encounter (HOSPITAL_BASED_OUTPATIENT_CLINIC_OR_DEPARTMENT_OTHER): Payer: Self-pay

## 2021-06-06 ENCOUNTER — Other Ambulatory Visit: Payer: Self-pay

## 2021-06-06 ENCOUNTER — Emergency Department (HOSPITAL_BASED_OUTPATIENT_CLINIC_OR_DEPARTMENT_OTHER): Payer: Medicare Other

## 2021-06-06 DIAGNOSIS — Z87891 Personal history of nicotine dependence: Secondary | ICD-10-CM | POA: Insufficient documentation

## 2021-06-06 DIAGNOSIS — S0181XA Laceration without foreign body of other part of head, initial encounter: Secondary | ICD-10-CM | POA: Diagnosis not present

## 2021-06-06 DIAGNOSIS — S14109A Unspecified injury at unspecified level of cervical spinal cord, initial encounter: Secondary | ICD-10-CM | POA: Diagnosis present

## 2021-06-06 DIAGNOSIS — W01198A Fall on same level from slipping, tripping and stumbling with subsequent striking against other object, initial encounter: Secondary | ICD-10-CM | POA: Diagnosis not present

## 2021-06-06 DIAGNOSIS — S0990XA Unspecified injury of head, initial encounter: Secondary | ICD-10-CM | POA: Diagnosis not present

## 2021-06-06 DIAGNOSIS — S12090A Other displaced fracture of first cervical vertebra, initial encounter for closed fracture: Secondary | ICD-10-CM

## 2021-06-06 DIAGNOSIS — I10 Essential (primary) hypertension: Secondary | ICD-10-CM | POA: Insufficient documentation

## 2021-06-06 DIAGNOSIS — Z79899 Other long term (current) drug therapy: Secondary | ICD-10-CM | POA: Diagnosis not present

## 2021-06-06 DIAGNOSIS — J449 Chronic obstructive pulmonary disease, unspecified: Secondary | ICD-10-CM | POA: Insufficient documentation

## 2021-06-06 DIAGNOSIS — S01411A Laceration without foreign body of right cheek and temporomandibular area, initial encounter: Secondary | ICD-10-CM | POA: Diagnosis not present

## 2021-06-06 DIAGNOSIS — S12000A Unspecified displaced fracture of first cervical vertebra, initial encounter for closed fracture: Secondary | ICD-10-CM | POA: Insufficient documentation

## 2021-06-06 DIAGNOSIS — S12040A Displaced lateral mass fracture of first cervical vertebra, initial encounter for closed fracture: Secondary | ICD-10-CM | POA: Diagnosis not present

## 2021-06-06 MED ORDER — LIDOCAINE HCL 1 % IJ SOLN
INTRAMUSCULAR | Status: AC
  Filled 2021-06-06: qty 20

## 2021-06-06 MED ORDER — LIDOCAINE-EPINEPHRINE (PF) 2 %-1:200000 IJ SOLN
INTRAMUSCULAR | Status: AC
  Administered 2021-06-06: 20 mL
  Filled 2021-06-06: qty 20

## 2021-06-06 MED ORDER — LIDOCAINE HCL 2 % IJ SOLN
INTRAMUSCULAR | Status: AC
  Filled 2021-06-06: qty 20

## 2021-06-06 NOTE — ED Provider Notes (Signed)
I provided a substantive portion of the care of this patient.  I personally performed the entirety of the medical decision making for this encounter.   Patient here after mechanical fall just prior to arrival.  Did have bleeding from her right temple area.  Likely a [place] one of the arteries.  Deep sutures placed.  Skin closed.  No indication for imaging at this time.      Lacretia Leigh, MD 06/06/21 [phone]

## 2021-06-06 NOTE — ED Notes (Signed)
Pt verbalizes understanding of discharge instructions. Opportunity for questioning and answers were provided. Pt discharged from ED to home. Reiterated multiple times to pt to wear c-collar at all times. Explained that sutures will need to be removed in 7 days. Pt reports understanding on calling tomorrow to schedule a follow-up appointment with neurosurgery.

## 2021-06-06 NOTE — ED Provider Notes (Signed)
[facility] EMERGENCY DEPT Provider Note   CSN: [id number] Arrival date & time: 06/06/21  1338     History Chief Complaint  Patient presents with   Pamela Morales is a 82 y.o. female with h/o COPD, HTN, lung mass presents to the ED for evaluation of bleeding head wound. Today around 1300, the patient was trimming some bushes when she tripped and fell, hitting her head on a curved concrete piece. She denies any loss of consciousness, blurry vision, headache, neck pain, back pain.  She denies any blood thinner use.  She reports that she came in because she could not get her head laceration to stop bleeding.  She has no other complaints or pain at this time.  Allergic to sulfa.  Former smoker.   Fall Pertinent negatives include no headaches.      Home Medications Prior to Admission medications   Medication Sig Start Date End Date Taking? Authorizing Provider  albuterol (VENTOLIN HFA) 108 (90 Base) MCG/ACT inhaler Inhale 2 puffs into the lungs every 4 (four) hours as needed for wheezing. 05/25/20   Tower, Wynelle Fanny, MD  amLODipine (NORVASC) 10 MG tablet TAKE 1 TABLET BY MOUTH EVERY DAY 02/17/21   Tower, Wynelle Fanny, MD  Calcium Carbonate-Vitamin D (CALTRATE 600+D PO) Take 1,200 mg by mouth every evening.     [provider]  Cyanocobalamin (B-12 PO) Take 5,000 mg by mouth once a week. (B-12)    [provider]  Doxylamine Succinate, Sleep, (UNISOM PO) Take 50 mg by mouth at bedtime.    [provider]  Ferrous Sulfate (IRON) 325 (65 Fe) MG TABS Take 1 tablet by mouth daily.    [provider]  FLUoxetine (PROZAC) 20 MG capsule TAKE 2 CAPSULES BY MOUTH EVERY DAY 02/17/21   Tower, Wynelle Fanny, MD  Fluticasone-Umeclidin-Vilant (TRELEGY ELLIPTA) 100-62.5-25 MCG/ACT AEPB Inhale 1 puff into the lungs daily. 06/04/21   Tyler Pita, MD  losartan (COZAAR) 50 MG tablet TAKE 1 TABLET BY MOUTH EVERY DAY 05/14/21   Tower, Wynelle Fanny, MD  MAGNESIUM PO Take  500 mg by mouth daily as needed (only takes when working out, for muscle cramps).     [provider]  MELATONIN PO Take 10 mg by mouth at bedtime.    [provider]  Multiple Vitamin (MULTIVITAMIN) tablet Take 1 tablet by mouth daily.    [provider]  Multiple Vitamins-Minerals (PRESERVISION AREDS 2 PO) Take 1 capsule by mouth daily.    [provider]  Secukinumab, 300 MG Dose, (COSENTYX, 300 MG DOSE,) 150 MG/ML SOSY Inject 1 application as directed every 30 (thirty) days. 08/12/19   [provider]      Allergies    Sulfa antibiotics    Review of Systems   Review of Systems  Eyes:  Negative for photophobia and visual disturbance.  Musculoskeletal:  Negative for back pain and neck pain.  Skin:  Positive for wound.  Neurological:  Negative for syncope, light-headedness and headaches.   Physical Exam Updated Vital Signs BP (!) 163/73   Pulse (!) 56   Temp 97.7 F (36.5 C)   Resp 17   SpO2 97%  Physical Exam Vitals and nursing note reviewed.  Constitutional:      Appearance: Normal appearance.  HENT:     Head: Normocephalic.     Comments: No bony step-offs or deformities visualized or palpated.  The patient has a small laceration noted to the right temple  approx 1.5cm    Right Ear: Tympanic membrane, ear canal and external ear normal.     Left Ear: Tympanic membrane, ear canal and external ear normal.  Eyes:     General: No scleral icterus.    Extraocular Movements: Extraocular movements intact.     Pupils: Pupils are equal, round, and reactive to light.  Neck:     Comments: No signs of trauma.  No obvious step-offs or deformities.  No paraspinal or midline tenderness to palpation.  Full range of motion without pain. Cardiovascular:     Rate and Rhythm: Normal rate and regular rhythm.  Pulmonary:     Effort: Pulmonary effort is normal. No respiratory distress.  Abdominal:     General: Abdomen is flat. Bowel sounds are normal.      Palpations: Abdomen is soft.  Musculoskeletal:        General: No deformity.     Cervical back: Normal range of motion. No tenderness.     Comments: No midline or paraspinal cervical, thoracic, or lumbar tenderness.  No signs of trauma.  No obvious deformities or step-offs visualized or palpated.  Skin:    General: Skin is warm and dry.  Neurological:     General: No focal deficit present.     Mental Status: She is alert. Mental status is at baseline.     GCS: GCS eye subscore is 4. GCS verbal subscore is 5. GCS motor subscore is 6.     Cranial Nerves: No cranial nerve deficit.     Sensory: No sensory deficit.     Motor: No weakness.     Coordination: Romberg sign negative. Finger-Nose-Finger Test normal.     Gait: Gait is intact.    ED Results / Procedures / Treatments   Labs (all labs ordered are listed, but only abnormal results are displayed) Labs Reviewed - No data to display  EKG None  Radiology CT Head Wo Contrast  Result Date: 06/06/2021 CLINICAL DATA:  Head and neck trauma, fall, laceration to right temporal area EXAM: CT HEAD WITHOUT CONTRAST CT CERVICAL SPINE WITHOUT CONTRAST TECHNIQUE: Multidetector CT imaging of the head and cervical spine was performed following the standard protocol without intravenous contrast. Multiplanar CT image reconstructions of the cervical spine were also generated. RADIATION DOSE REDUCTION: This exam was performed according to the departmental dose-optimization program which includes automated exposure control, adjustment of the mA and/or kV according to patient size and/or use of iterative reconstruction technique. COMPARISON:  None Available. FINDINGS: CT HEAD FINDINGS Brain: No evidence of acute infarction, hemorrhage, cerebral edema, mass, mass effect, or midline shift. No hydrocephalus or extra-axial fluid collection. Vascular: No hyperdense vessel. Skull: Normal. Negative for fracture or focal lesion. Sinuses/Orbits: No acute finding.  Other: The mastoid air cells are well aerated. CT CERVICAL SPINE FINDINGS Alignment: Straightening of the normal cervical lordosis. Trace retrolisthesis of C6 on C7, which appears facet mediated. Skull base and vertebrae: Minimally displaced fracture of the posteroinferior endplate of the C1 right lateral mass (series 6, image 25 and series 3, image 23), which is favored to be acute. Mild asymmetry of the C1 lateral masses is favored to be secondary head position. No additional fracture or suspicious osseous lesion. Soft tissues and spinal canal: No prevertebral fluid or swelling. No visible canal hematoma. Disc levels: Multilevel degenerative changes most prominently at C5-C6, where there is mild spinal canal stenosis. Multilevel uncovertebral and facet arthropathy which is also worst at C5-C6, where there is severe right neural foraminal  narrowing. Upper chest: Mild opacities in the left apex are likely related to the known left lung mass. No acute pulmonary opacity or pleural effusion. Other: Redemonstrated 3.1 x 2.3 x 3.8 cm hypoenhancing nodule in the left thyroid lobe. IMPRESSION: 1. Minimally displaced fracture of the posteroinferior endplate of the C1 right lateral mass, favored to be acute. Mild asymmetry of the C1 lateral masses is favored to be secondary to head position. No additional fracture is seen. No listhesis. 2.  No acute intracranial process. 3. Redemonstrated left thyroid lobe hypoenhancing nodule which was not hypermetabolic on recent PET. These results were called by telephone at the time of interpretation on 06/06/2021 at 5:58 pm to provider Deairra Halleck , who verbally acknowledged these results. Electronically Signed   By: Merilyn Baba M.D.   On: 06/06/2021 17:59   CT Cervical Spine Wo Contrast  Result Date: 06/06/2021 CLINICAL DATA:  Head and neck trauma, fall, laceration to right temporal area EXAM: CT HEAD WITHOUT CONTRAST CT CERVICAL SPINE WITHOUT CONTRAST TECHNIQUE: Multidetector CT  imaging of the head and cervical spine was performed following the standard protocol without intravenous contrast. Multiplanar CT image reconstructions of the cervical spine were also generated. RADIATION DOSE REDUCTION: This exam was performed according to the departmental dose-optimization program which includes automated exposure control, adjustment of the mA and/or kV according to patient size and/or use of iterative reconstruction technique. COMPARISON:  None Available. FINDINGS: CT HEAD FINDINGS Brain: No evidence of acute infarction, hemorrhage, cerebral edema, mass, mass effect, or midline shift. No hydrocephalus or extra-axial fluid collection. Vascular: No hyperdense vessel. Skull: Normal. Negative for fracture or focal lesion. Sinuses/Orbits: No acute finding. Other: The mastoid air cells are well aerated. CT CERVICAL SPINE FINDINGS Alignment: Straightening of the normal cervical lordosis. Trace retrolisthesis of C6 on C7, which appears facet mediated. Skull base and vertebrae: Minimally displaced fracture of the posteroinferior endplate of the C1 right lateral mass (series 6, image 25 and series 3, image 23), which is favored to be acute. Mild asymmetry of the C1 lateral masses is favored to be secondary head position. No additional fracture or suspicious osseous lesion. Soft tissues and spinal canal: No prevertebral fluid or swelling. No visible canal hematoma. Disc levels: Multilevel degenerative changes most prominently at C5-C6, where there is mild spinal canal stenosis. Multilevel uncovertebral and facet arthropathy which is also worst at C5-C6, where there is severe right neural foraminal narrowing. Upper chest: Mild opacities in the left apex are likely related to the known left lung mass. No acute pulmonary opacity or pleural effusion. Other: Redemonstrated 3.1 x 2.3 x 3.8 cm hypoenhancing nodule in the left thyroid lobe. IMPRESSION: 1. Minimally displaced fracture of the posteroinferior endplate  of the C1 right lateral mass, favored to be acute. Mild asymmetry of the C1 lateral masses is favored to be secondary to head position. No additional fracture is seen. No listhesis. 2.  No acute intracranial process. 3. Redemonstrated left thyroid lobe hypoenhancing nodule which was not hypermetabolic on recent PET. These results were called by telephone at the time of interpretation on 06/06/2021 at 5:58 pm to provider Tierra Divelbiss , who verbally acknowledged these results. Electronically Signed   By: Merilyn Baba M.D.   On: 06/06/2021 17:59    Procedures .Marland KitchenLaceration Repair  Date/Time: 06/06/2021 8:04 PM Performed by: Sherrell Puller, PA-C Authorized by: Sherrell Puller, PA-C   Consent:    Consent obtained:  Verbal   Consent given by:  Patient   Risks discussed:  Infection, need for additional repair, pain, poor cosmetic result and poor wound healing   Alternatives discussed:  No treatment and delayed treatment Universal protocol:    Procedure explained and questions answered to patient or proxy's satisfaction: yes     Imaging studies available: yes     Patient identity confirmed:  Verbally with patient Pre-procedure details:    Preparation:  Patient was prepped and draped in usual sterile fashion Skin repair:    Repair method:  Sutures   Suture size:  5-0   Suture material:  Nylon   Suture technique:  Simple interrupted   Number of sutures:  3 Approximation:    Approximation:  Close Repair type:    Repair type:  Intermediate Post-procedure details:    Dressing:  Non-adherent dressing   Procedure completion:  Tolerated well, no immediate complications Comments:     Majority of suture repair done by my attending who placed Chromic Gut sutures for hemostasis as the patient had bleeding.  He placed a few more to initially close the laceration of the temple to assure closure.  I placed another 3 sutures to complete the repair.  Please see his note.    Medications Ordered in  ED Medications  lidocaine (XYLOCAINE) 2 % (with pres) injection (  Not Given 06/06/21 1712)  lidocaine-EPINEPHrine (XYLOCAINE W/EPI) 2 %-1:200000 (PF) injection (20 mLs  Given by Other 06/06/21 1658)    ED Course/ Medical Decision Making/ A&P                           Medical Decision Making Amount and/or Complexity of Data Reviewed Radiology: ordered.    81 year old female presents the emergency department for evaluation of laceration to the head.  Differential diagnosis includes was not limited to skull fracture, neck fracture, intracranial bleed, laceration.  Vital signs show elevated blood pressure at 172/77, afebrile, slightly bradycardic, normal respiratory ration rate and satting well on room air.  Physical exam is pertinent for well-appearing patient with a small approximate 1.5 cm laceration noted to the right temple area that is bleeding.  She has no other signs of trauma to the neck or back.  No midline or paraspinal cervical, thoracic, or lumbar tenderness to palpation.  No step-offs or deformities noted of the skull/scalp.  Nontender to palpation other than the area over the right temple. Benign neurological exam.  PERRLA.  EOMI.  The laceration did not respond to direct pressure.  Had my attending assessed at bedside who injected lidocaine with epinephrine into the wound as well as placed some deeper sutures to provide hemostasis.  He placed the initial 2 sutures and I finished the repair with an additional 3.  Patient tolerated procedure well and hemostasis was achieved afterwards.  CT imaging of the head shows no acute intercranial process.  However, the read of the CT imaging shows a minimally displaced fracture in the posterior inferior endplate of the C1 right lateral mass, favored to be acute.  Mild asymmetry of the C1 lateral masses is favored to be secondary to head position.  No additional fracture is seen.  No listhesis.  There is also a left thyroid lobe hypoenhancing nodule  that was seen on recent PET scan.  Patient knows about this and is receiving an additional scan through her chest in a few weeks.  The patient has no radiculopathy or any pain upon palpation or obvious deformity of the neck.  However she will still be placed  in a c-collar.  Patient has slightly elevated blood pressure otherwise normal.  Her bleeding from the head is controlled.  The patient safe for discharge.  Because of the findings, the patient was placed in a [place] hard cervical collar.  I discussed the findings with both the patient and significant other in the room.  We discussed the need for leaving on the cervical collar to prevent bodily harm, paralysis, or even death.  Patient is having other verbalized understanding.  We will give her follow-up for neurosurgery.  I advised her to come back in a week for removal of sutures.  We discussed strict return precautions and red flag symptoms.  Patient verbalizes understanding and agrees to plan.  Patient is stable and being discharged home in good condition.  I discussed this case with my attending physician who cosigned this note including patient's presenting symptoms, physical exam, and planned diagnostics and interventions. Attending physician stated agreement with plan or made changes to plan which were implemented.   Attending physician assessed patient at bedside.  Final Clinical Impression(s) / ED Diagnoses Final diagnoses:  Other closed displaced fracture of first cervical vertebra, initial encounter [facility])  Facial laceration, initial encounter    Rx / DC Orders ED Discharge Orders     None         Sherrell Puller, Hershal Coria 06/09/21 2255    Lacretia Leigh, MD 06/15/21 1605

## 2021-06-06 NOTE — ED Triage Notes (Addendum)
Pt presents POV d/t tripping over a hedge while trimming bushes, fell and hit her head on a "concrete header"' pt presents with a lac to her Right temporal area, bleeding controlled in triage with a pressure dressing   Pt did not LOC, denies any blood thinners

## 2021-06-06 NOTE — Discharge Instructions (Addendum)
You were seen in the emergency department for evaluation of your fall and head bleed.  CT of your head is unremarkable, but your CT of your neck did show a fracture.  Because of this, we have placed you in a cervical collar but she will need to remain in place until you are seen by a neurosurgeon.  I included information for Dr. Christella Noa in his office.  Please call tomorrow to schedule an appointment as soon as possible.  Again, you will need to remain in the c-collar until you are seen by him.  If you have any numbness or tingling arms or legs, difficulty breathing, increasing pain, please return to the nearest emergency department.  We were able to stitch of your face for you, you will need to return in 7 days for removal of the sutures.  If your wound continues to bleed, you will need to return to the emergency department.  Otherwise, please clean daily with Dial soap and water.  Contact a health care provider if: You have irritation or sores on your skin from your brace or cervical collar. Get help right away if: You have neck pain that gets worse. You develop difficulties swallowing or breathing. You develop swelling in your neck. You have any of the following problems in your arms or legs or both: Numbness. Weakness. Burning pain. Movement problems. You are unable to control when you urinate or have a bowel movement (incontinence). You have problems with coordination or difficulty walking.

## 2021-06-06 NOTE — ED Notes (Signed)
Clean Pt forehead with saline and gauze to remove left over blood.

## 2021-06-07 ENCOUNTER — Telehealth: Payer: Self-pay | Admitting: *Deleted

## 2021-06-07 NOTE — Telephone Encounter (Signed)
Pt called in to inform us that she recently fell in her yard after tripping over a rock. She went to the ED and was further evaluated. She suffered from laceration and cervical fracture. It was recommended that she follow up with neurosurgery and is requesting information for neurosurgery in [place]. Contact info given to pt to follow up with neurosurgery at [facility]. Pt states that she is feeling well and is not in any pain or discomfort. Advised pt to keep all her appts as scheduled. Pt verbalized understanding.

## 2021-06-08 ENCOUNTER — Telehealth: Payer: Self-pay

## 2021-06-08 NOTE — Telephone Encounter (Signed)
Error

## 2021-06-10 ENCOUNTER — Encounter
Admission: RE | Admit: 2021-06-10 | Discharge: 2021-06-10 | Disposition: A | Discharge status: Home / Self Care | Payer: Medicare Other | Source: Ambulatory Visit | Attending: Pulmonary Disease | Admitting: Pulmonary Disease

## 2021-06-10 ENCOUNTER — Telehealth: Payer: Self-pay

## 2021-06-10 ENCOUNTER — Telehealth: Payer: Self-pay | Admitting: Pulmonary Disease

## 2021-06-10 ENCOUNTER — Telehealth: Payer: Self-pay | Admitting: Family Medicine

## 2021-06-10 DIAGNOSIS — J449 Chronic obstructive pulmonary disease, unspecified: Secondary | ICD-10-CM

## 2021-06-10 DIAGNOSIS — Z01818 Encounter for other preprocedural examination: Secondary | ICD-10-CM

## 2021-06-10 HISTORY — DX: Angina pectoris, unspecified: I20.9

## 2021-06-10 HISTORY — DX: Dyspnea, unspecified: R06.00

## 2021-06-10 HISTORY — DX: Headache, unspecified: R51.9

## 2021-06-10 HISTORY — DX: Other complications of anesthesia, initial encounter: T88.59XA

## 2021-06-10 HISTORY — DX: Unspecified displaced fracture of first cervical vertebra, initial encounter for closed fracture: S12.000A

## 2021-06-10 HISTORY — DX: Other nonspecific abnormal finding of lung field: R91.8

## 2021-06-10 HISTORY — DX: Gastro-esophageal reflux disease without esophagitis: K21.9

## 2021-06-10 NOTE — Progress Notes (Signed)
  Perioperative Services Pre-Admission/Anesthesia Testing    Date: 06/10/21  Name: Pamela Morales MRN:   [id number]  Re: Plans for pulmonary surgery/procedure  Planned Surgical Procedure(s):    Case: [id number] Date: 06/16/21   Procedures:      ROBOTIC ASSISTED NAVIGATIONAL BRONCHOSCOPY (Left) (canceled)     POSSIBLE VIDEO BRONCHOSCOPY WITH ENDOBRONCHIAL ULTRASOUND (Left) (canceled)   Anesthesia type: General   Pre-op diagnosis: LUNG MASS LEFT   Location: [facility]   Surgeons: Tyler Pita, MD   Clinical Notes:  Patient scheduled for the above procedure on 06/16/2021 with Dr. Vernard Gambles, MD.  During her PAT interview today, we learned that patient sustained a fall over the weekend.  She presented for care in the emergency department where CT imaging revealed a C1 fracture.  PCCM provider was made aware of this and decision was made to cancel her ENB/EBUS.  In efforts to avoid delay in tissue sampling for definitive diagnosis, discussed having patient seen locally by neurosurgery and potentially having PCCM and neurosurgery perform any necessary procedures together.  Information sent to [facility] [name], MD).  In speaking with clinical staff, I learned that patient had called their office to request an appointment already. Patient was advised by her PCP that she needed to be seen sooner by neurosurgery.  Neurosurgeon reviewed imaging and advised that fracture insignificant and would not require surgical intervention.  Preliminary plans would be to place patient in a collar for 6 weeks, however MD did indicate that he would clear patient to proceed with planned ENB/EBUS so that she could proceed with definitive diagnosis and treatment of malignancy.  Neurosurgery to see patient tomorrow (06/11/2021) for physical examination, further discussion, and official clearance for planned procedure.    Interdisciplinary discussion regarding patient as per above  between PCCM, neurosurgery, and preadmission/anesthesia.  Everyone in agreement at this point. I will send communication to patient's primary oncologist [name], MD) to update him on plan of care as it stands at this point.  Additionally, I have sent medication to patient's oncology nurse navigator [name], RN) as well so that she can have all the information that I have in the event that patient and/or family calls with questions.   Appreciate collaborative discussions today between involved specialty providers. I feel as if the intended approach to her care will allow for the best outcomes for her. When weighing the risks vs. 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   Honor Loh, MSN, APRN, FNP-C, CEN [facility]  Peri-operative Services Nurse Practitioner Phone: [phone] 06/10/21 5:38 PM  NOTE: This note has been prepared using Dragon dictation software. Despite my best ability to proofread, there is always the potential that unintentional transcriptional errors may still occur from this process.

## 2021-06-10 NOTE — Telephone Encounter (Signed)
Pt called regarding a referral to neurosurgery, pt was supposed to have a biopsy on 06/16/21 but she fell Sunday 06/06/21 and she had a closed displaced fracture of first servical vertibre, she has an appt on 06/21/21 with the neurosurgery on [address] in [place] [phone]. She said they cant do the biopsy until she sees the neurosurgeon so she was hoping we could help her get in sooner. Call back for pt is [phone]

## 2021-06-10 NOTE — Telephone Encounter (Signed)
I have CXL CT scheduled for 06/14/21

## 2021-06-10 NOTE — Patient Instructions (Addendum)
Your procedure is scheduled on:06-16-21 Wednesday Report to the Registration Desk on the 1st floor of the [facility].Then proceed to the 2nd floor Surgery Desk To find out your arrival time, please call [phone] between 1PM - 3PM on:06-15-21 Tuesday If your arrival time is 6:00 am, do not arrive prior to that time as the [facility] entrance doors do not open until 6:00 am.  REMEMBER: Instructions that are not followed completely may result in serious medical risk, up to and including death; or upon the discretion of your surgeon and anesthesiologist your surgery may need to be rescheduled.  Do not eat food OR drink any liquids after midnight the night before surgery.  No gum chewing, lozengers or hard candies.  TAKE THESE MEDICATIONS THE MORNING OF SURGERY WITH A SIP OF WATER: -FLUoxetine (PROZAC)  Use your TRELEGY ELLIPTA Inhaler the day of surgery  One week prior to surgery: Stop Anti-inflammatories (NSAIDS) such as Advil, Aleve, Ibuprofen, Motrin, Naproxen, Naprosyn and Aspirin based products such as Excedrin, Goodys Powder, BC Powder.You may however, take Tylenol if needed for pain up until the day of surgery. Stop ANY OVER THE COUNTER supplements/vitamins NOW (06-10-21) until after surgery (Calcium-Vitamin D, B12, Magnesium, Multivitamin, and Preservision Areds-You may continue your Melatonin and Ferrous Sulfate up until the day prior to surgery  No Alcohol for 24 hours before or after surgery.  No Smoking including e-cigarettes for 24 hours prior to surgery.  No chewable tobacco products for at least 6 hours prior to surgery.  No nicotine patches on the day of surgery.  Do not use any "recreational" drugs for at least a week prior to your surgery.  Please be advised that the combination of cocaine and anesthesia may have negative outcomes, up to and including death. If you test positive for cocaine, your surgery will be cancelled.  On the morning of surgery brush your teeth  with toothpaste and water, you may rinse your mouth with mouthwash if you wish. Do not swallow any toothpaste or mouthwash  Do not wear jewelry, make-up, hairpins, clips or nail polish.  Do not wear lotions, powders, or perfumes.   Do not shave body from the neck down 48 hours prior to surgery just in case you cut yourself which could leave a site for infection.  Also, freshly shaved skin may become irritated if using the CHG soap.  Contact lenses, hearing aids and dentures may not be worn into surgery.  Do not bring valuables to the hospital. [facility] is not responsible for any missing/lost belongings or valuables.   Notify your doctor if there is any change in your medical condition (cold, fever, infection).  Wear comfortable clothing (specific to your surgery type) to the hospital.  After surgery, you can help prevent lung complications by doing breathing exercises.  Take deep breaths and cough every 1-2 hours. Your doctor may order a device called an Incentive Spirometer to help you take deep breaths. When coughing or sneezing, hold a pillow firmly against your incision with both hands. This is called "splinting." Doing this helps protect your incision. It also decreases belly discomfort.  If you are being admitted to the hospital overnight, leave your suitcase in the car. After surgery it may be brought to your room.  If you are being discharged the day of surgery, you will not be allowed to drive home. You will need a responsible adult (18 years or older) to drive you home and stay with you that night.  If you are taking public transportation, you will need to have a responsible adult (18 years or older) with you. Please confirm with your physician that it is acceptable to use public transportation.   Please call the [facility] Dept. at [phone] if you have any questions about these instructions.  Surgery Visitation Policy:  Patients undergoing a surgery  or procedure may have two family members or support persons with them as long as the person is not COVID-19 positive or experiencing its symptoms.

## 2021-06-10 NOTE — Telephone Encounter (Signed)
Received below message via epic secure chat.  we will have to postpone BX until after she sees neurosurgery.  We will have to wait to see what neurosurgery says and postpone the procedure.  I will also have to postpone her CT chest.  I am adding my CMA and oncology nurse navigator as an [name].  Spoke to patient and relayed above message. She is aware that bronch will need to be canceled until she follows up with neurosurgery.  Spoke to stephanie with scheduling and canceled bronch.    Pamela Morales, please cancel CT. Thanks

## 2021-06-10 NOTE — Telephone Encounter (Signed)
Please refer to duplicate phone note as 06/10/2021.   Spoke to patient. She stated that she contacted PCP and requested that they contact neurosurgery for a sooner appt then 06/21/2021. She will call back when cleared by neurosurgery.  Nothing further needed at this time.

## 2021-06-10 NOTE — Telephone Encounter (Signed)
Called to Dr. Christella Noa office to see if they had any thing any sooner , Had to lvm for his CMA to call me back.

## 2021-06-11 DIAGNOSIS — S12000A Unspecified displaced fracture of first cervical vertebra, initial encounter for closed fracture: Secondary | ICD-10-CM | POA: Diagnosis not present

## 2021-06-11 DIAGNOSIS — M47812 Spondylosis without myelopathy or radiculopathy, cervical region: Secondary | ICD-10-CM | POA: Diagnosis not present

## 2021-06-11 DIAGNOSIS — S12091A Other nondisplaced fracture of first cervical vertebra, initial encounter for closed fracture: Secondary | ICD-10-CM | POA: Diagnosis not present

## 2021-06-11 NOTE — Telephone Encounter (Signed)
Pt called  said that she was able to get in with  another Neurosurgereon  sooner with Dr [name]

## 2021-06-11 NOTE — Telephone Encounter (Signed)
Received call from Dr. Randa Ngo proceed with bronch as scheduled.  Spoke to [employer] with scheduling and rescheduled bronch for 06/16/2021.   Rodena Piety, please reschedule CT.

## 2021-06-11 NOTE — Telephone Encounter (Signed)
Patient is aware that bronch has been rescheduled. She voiced her understanding and had no further questions.  Nothing further needed.

## 2021-06-11 NOTE — Telephone Encounter (Signed)
Spoke w/ Dr Cyndy Freeze CMA , he doesn't have any thing any early because, he will be out of the  the office next week. The earlier  appt he has is going to be on 06/21/2021. Will call to inform pt.

## 2021-06-11 NOTE — Telephone Encounter (Signed)
I have spoke with Pamela Morales and CT has been rescheduled for 06/15/21 @ 10:00am [facility] and she is aware

## 2021-06-12 ENCOUNTER — Other Ambulatory Visit: Payer: Self-pay | Admitting: Family Medicine

## 2021-06-14 ENCOUNTER — Ambulatory Visit: Payer: Medicare Other

## 2021-06-14 ENCOUNTER — Encounter
Admission: RE | Admit: 2021-06-14 | Discharge: 2021-06-14 | Disposition: A | Discharge status: Home / Self Care | Payer: Medicare Other | Source: Ambulatory Visit | Attending: Pulmonary Disease | Admitting: Pulmonary Disease

## 2021-06-14 ENCOUNTER — Other Ambulatory Visit: Payer: Medicare Other

## 2021-06-14 DIAGNOSIS — Z01818 Encounter for other preprocedural examination: Secondary | ICD-10-CM

## 2021-06-14 DIAGNOSIS — Z01810 Encounter for preprocedural cardiovascular examination: Secondary | ICD-10-CM | POA: Diagnosis not present

## 2021-06-14 DIAGNOSIS — Z20822 Contact with and (suspected) exposure to covid-19: Secondary | ICD-10-CM | POA: Insufficient documentation

## 2021-06-14 DIAGNOSIS — J449 Chronic obstructive pulmonary disease, unspecified: Secondary | ICD-10-CM | POA: Diagnosis not present

## 2021-06-15 ENCOUNTER — Inpatient Hospital Stay: Payer: Medicare Other | Attending: Oncology | Admitting: Licensed Clinical Social Worker

## 2021-06-15 ENCOUNTER — Inpatient Hospital Stay: Payer: Medicare Other

## 2021-06-15 ENCOUNTER — Ambulatory Visit
Admission: RE | Admit: 2021-06-15 | Discharge: 2021-06-15 | Disposition: A | Discharge status: Home / Self Care | Payer: Medicare Other | Source: Ambulatory Visit | Attending: Pulmonary Disease | Admitting: Pulmonary Disease

## 2021-06-15 ENCOUNTER — Encounter: Payer: Self-pay | Admitting: Licensed Clinical Social Worker

## 2021-06-15 DIAGNOSIS — Z808 Family history of malignant neoplasm of other organs or systems: Secondary | ICD-10-CM | POA: Diagnosis not present

## 2021-06-15 DIAGNOSIS — Z853 Personal history of malignant neoplasm of breast: Secondary | ICD-10-CM

## 2021-06-15 DIAGNOSIS — Z800 Family history of malignant neoplasm of digestive organs: Secondary | ICD-10-CM | POA: Diagnosis not present

## 2021-06-15 DIAGNOSIS — E041 Nontoxic single thyroid nodule: Secondary | ICD-10-CM | POA: Insufficient documentation

## 2021-06-15 DIAGNOSIS — Z9010 Acquired absence of unspecified breast and nipple: Secondary | ICD-10-CM | POA: Insufficient documentation

## 2021-06-15 DIAGNOSIS — Z90710 Acquired absence of both cervix and uterus: Secondary | ICD-10-CM | POA: Insufficient documentation

## 2021-06-15 DIAGNOSIS — R911 Solitary pulmonary nodule: Secondary | ICD-10-CM | POA: Insufficient documentation

## 2021-06-15 DIAGNOSIS — J449 Chronic obstructive pulmonary disease, unspecified: Secondary | ICD-10-CM | POA: Diagnosis not present

## 2021-06-15 DIAGNOSIS — Z809 Family history of malignant neoplasm, unspecified: Secondary | ICD-10-CM | POA: Insufficient documentation

## 2021-06-15 DIAGNOSIS — R918 Other nonspecific abnormal finding of lung field: Secondary | ICD-10-CM | POA: Diagnosis not present

## 2021-06-15 DIAGNOSIS — Z87891 Personal history of nicotine dependence: Secondary | ICD-10-CM | POA: Insufficient documentation

## 2021-06-15 DIAGNOSIS — Z8543 Personal history of malignant neoplasm of ovary: Secondary | ICD-10-CM

## 2021-06-15 DIAGNOSIS — Z85820 Personal history of malignant melanoma of skin: Secondary | ICD-10-CM | POA: Insufficient documentation

## 2021-06-15 DIAGNOSIS — Z86006 Personal history of melanoma in-situ: Secondary | ICD-10-CM

## 2021-06-15 DIAGNOSIS — C3412 Malignant neoplasm of upper lobe, left bronchus or lung: Secondary | ICD-10-CM | POA: Insufficient documentation

## 2021-06-15 DIAGNOSIS — Z801 Family history of malignant neoplasm of trachea, bronchus and lung: Secondary | ICD-10-CM | POA: Insufficient documentation

## 2021-06-15 DIAGNOSIS — R0602 Shortness of breath: Secondary | ICD-10-CM | POA: Diagnosis not present

## 2021-06-15 LAB — SARS CORONAVIRUS 2 (TAT 6-24 HRS): SARS Coronavirus 2: NEGATIVE

## 2021-06-15 NOTE — Progress Notes (Addendum)
REFERRING PROVIDER: Lloyd Huger, MD [address],  [place] [address]  PRIMARY PROVIDER:  Abner Greenspan, MD  PRIMARY REASON FOR VISIT:  1. Personal history of malignant neoplasm of breast   2. Personal history of ovarian cancer   3. Family history of colon cancer   4. Family history of melanoma   5. Family history of stomach cancer   6. 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 female, was seen for a [place] cancer genetics consultation at the request of Dr. [name] due to a personal and family history of cancer.  Pamela Morales presents to clinic today to discuss the possibility of a hereditary predisposition to cancer, genetic testing, and to further clarify her future cancer risks, as well as potential cancer risks for family members.   At the age of 74, Pamela Morales was diagnosed with ovarian cancer incidentally after having a total hysterectomy. In 2010, at the age of 71, Pamela Morales was diagnosed with breast cancer. This was treated with mastectomy. She had genetic testing that year (result available for review) for BRCA1/2 that was negative. In the last few years, Pamela Morales had a melanoma removed.  In 2023, a left upper lobe lung mass was identified.  CANCER HISTORY:  Oncology History   No history exists.     RISK FACTORS:  Menarche was at age 21.  First live birth at age 38.  Ovaries intact: no. Hysterectomy: yes.  Menopausal status: postmenopausal.  Colonoscopy: yes;  polyps, unsure how many but believes less than 10 total .   Past Medical History:  Diagnosis Date   Allergy    Anemia    PAST HX    Anginal pain ([place])    Breast cancer ([place])    C1 cervical fracture ([place])    placed in c-collar-to f/u with neurosurgery on 06-21-21 in [place] - cancer of ovary    Cancer ([place]) 02/2008   R breast  (est rec neg and brac neg)   Cataract    FORMING    Complication of anesthesia     deviated septum-lost memory for about 1 week after anesthesia   COPD (chronic obstructive pulmonary disease) ([place])    DDD (degenerative disc disease)    in back with chronic pain   Depression    Diverticulosis    Dyspnea    with exertion   Eczema    Family history of colon cancer    GERD (gastroesophageal reflux disease)    Headache    h/o migraines   Hypertension    Mass of left lung    Osteopenia    Rosacea    [place] 02/2007    Past Surgical History:  Procedure Laterality Date   ABDOMINAL HYSTERECTOMY  1996   BSO fibroids, incidental ovarian CA finding   BASAL CELL CARCINOMA EXCISION  09/2016   bladder tack     BREAST SURGERY     rt total mastectomy for breast CA   CHOLECYSTECTOMY  08/1996   10 yrs ago   COLONOSCOPY     lipoma removal  11/2005   NASAL SEPTUM SURGERY     POLYPECTOMY      Social History   Socioeconomic History   Marital status: Married    Spouse name: Not on file   Number of children: Not on file   Years of education: Not on file   Highest  education level: Not on file  Occupational History   Not on file  Tobacco Use   Smoking status: Former    Packs/day: 2.00    Years: 10.00    Total pack years: 20.00    Types: Cigarettes    Quit date: 01/03/1990    Years since quitting: 31.4   Smokeless tobacco: Never  Vaping Use   Vaping Use: Never used  Substance and Sexual Activity   Alcohol use: Yes    Alcohol/week: 7.0 standard drinks of alcohol    Types: 5 Glasses of wine, 2 Standard drinks or equivalent per week    Comment: occ glass of wine   Drug use: No   Sexual activity: Not Currently  Other Topics Concern   Not on file  Social History Narrative   Not on file   Social Determinants of Health   Financial Resource Strain: Low Risk  (09/19/2020)   Overall Financial Resource Strain (CARDIA)    Difficulty of Paying Living Expenses: Not hard at all  Food Insecurity: No Food Insecurity (09/19/2020)   Hunger Vital Sign    Worried About  Running Out of Food in the Last Year: Never true    Ran Out of Food in the Last Year: Never true  Transportation Needs: No Transportation Needs (09/19/2020)   PRAPARE - Hydrologist (Medical): No    Lack of Transportation (Non-Medical): No  Physical Activity: Sufficiently Active (09/19/2020)   Exercise Vital Sign    Days of Exercise per Week: 4 days    Minutes of Exercise per Session: 90 min  Stress: No Stress Concern Present (09/19/2020)   [place]    Feeling of Stress : Not at all  Social Connections: [place] (09/19/2020)   Social Connection and Isolation Panel [NHANES]    Frequency of Communication with Friends and Family: More than three times a week    Frequency of Social Gatherings with Friends and Family: Three times a week    Attends Religious Services: More than 4 times per year    Active Member of Clubs or Organizations: Yes    Attends Music therapist: More than 4 times per year    Marital Status: Married     FAMILY HISTORY:  We obtained a detailed, 4-generation family history.  Significant diagnoses are listed below: Family History  Problem Relation Age of Onset   Hypertension Mother    Heart disease Mother        CAD   Stomach cancer Mother 59   Lung cancer Father 21   Cancer Sister        colon, skin   Heart disease Sister 64       MI   Diabetes Sister    Colon cancer Sister    Head & neck cancer Sister 36   Lung cancer Sister    Melanoma Sister        multiple on head   Heart disease Brother        HTN and MI   Hypertension Brother    Cancer Maternal Aunt        unk type   Cancer Paternal Aunt        unk type   Cancer Paternal Uncle        unk type   Colon cancer Daughter 85   Esophageal cancer Neg Hx    Colon polyps Neg Hx    Pamela Morales has  1 daughter, 47, who had colon cancer at 43. Pamela Morales has 1 son as well who lives in  [place], [place]. Pamela Morales had 4 sisters and 2 brothers. One sister had neck cancer and died at 55. Another sister had colon cancer and passed in her 67s. Her living sister has lung cancer and history of multiple melanomas on her head/face.   Pamela Morales mother had stomach cancer and passed at 17 due to heart issues. Patient had 13 maternal aunts/uncles. One aunt had cancer, unknown type.  Pamela Morales father had lung cancer and passed at 75. Patient had 6-7 paternal aunts and uncles, 1 aunt and at least 1 uncle had cancer, unknown types.   Ms. Cousineau is aware of previous family history of genetic testing for hereditary cancer risks. There is no reported Ashkenazi Jewish ancestry. There is no known consanguinity.  GENETIC COUNSELING ASSESSMENT: Ms. Troup is a 81 y.o. female with a personal history of breast and ovarian cancer which is somewhat suggestive of a hereditary cancer syndrome and predisposition to cancer. We, therefore, discussed and recommended the following at today's visit.   DISCUSSION: We discussed that approximately 10% of breast cancer is hereditary and 10-20% of ovarian cancer is hereditary. Most cases of hereditary breast/ovarian cancer are associated with BRCA1/BRCA2 genes, which she did have testing for in the past. However since our technology has improved, we could identify a mutation in one of these genes that was previously missed. Additionally there are other genes associated with hereditary cancer to test now. Cancers and risks are gene specific. We discussed that testing is beneficial for several reasons including knowing about cancer risks, identifying potential screening and risk-reduction options that may be appropriate, and to understand if other family members could be at risk for cancer and allow them to undergo genetic testing.   We reviewed the characteristics, features and inheritance patterns of hereditary cancer syndromes. We also discussed genetic testing,  including the appropriate family members to test, the process of testing, insurance coverage and turn-around-time for results. We discussed the implications of a negative, positive and/or variant of uncertain significant result. We recommended Ms. Talerico pursue genetic testing for the [facility] Multi-Cancer+RNA gene panel.   The Multi-Cancer Panel + RNA offered by [facility] includes sequencing and/or deletion duplication testing of the following 84 genes: AIP, ALK, APC, ATM, AXIN2,BAP1,  BARD1, BLM, BMPR1A, BRCA1, BRCA2, BRIP1, CASR, CDC73, CDH1, CDK4, CDKN1B, CDKN1C, CDKN2A (p14ARF), CDKN2A (p16INK4a), CEBPA, CHEK2, CTNNA1, DICER1, DIS3L2, EGFR (c.2369C>T, p.Thr790Met variant only), EPCAM (Deletion/duplication testing only), FH, FLCN, GATA2, GPC3, GREM1 (Promoter region deletion/duplication testing only), HOXB13 (c.251G>A, p.Gly84Glu), HRAS, KIT, MAX, MEN1, MET, MITF (c.952G>A, p.Glu318Lys variant only), MLH1, MSH2, MSH3, MSH6, MUTYH, NBN, NF1, NF2, NTHL1, PALB2, PDGFRA, PHOX2B, PMS2, POLD1, POLE, POT1, PRKAR1A, PTCH1, PTEN, RAD50, RAD51C, RAD51D, RB1, RECQL4, RET, RUNX1, SDHAF2, SDHA (sequence changes only), SDHB, SDHC, SDHD, SMAD4, SMARCA4, SMARCB1, SMARCE1, STK11, SUFU, TERC, TERT, TMEM127, TP53, TSC1, TSC2, VHL, WRN and WT1.  Based on Ms. Benning personal and family history of cancer, she meets medical criteria for genetic testing. Despite that she meets criteria, she may still have an out of pocket cost. We discussed that if her out of pocket cost for testing is over $100, the laboratory will call and confirm whether she wants to proceed with testing.  If the out of pocket cost of testing is less than $100 she will be billed by the genetic testing laboratory.   PLAN: After considering the risks, benefits, and limitations, Ms. Abair  provided informed consent to pursue genetic testing and the blood sample was sent to [facility] for analysis of the Multi-Cancer+RNA panel. Results should be available  within approximately 2-3 weeks' time, at which point they will be disclosed by telephone to Ms. Berges, as will any additional recommendations warranted by these results. Ms. Schwan will receive a summary of her genetic counseling visit and a copy of her results once available. This information will also be available in Epic.   Ms. Olivier questions were answered to her satisfaction today. Our contact information was provided should additional questions or concerns arise. Thank you for the referral and allowing [place] to share in the care of your patient.   Faith Rogue, MS, [facility] Genetic Counselor [email or web address] Phone: [phone]  The patient was seen for a total of 30 minutes in face-to-face genetic counseling.  Dr. [name] was available for discussion regarding this case.   _______________________________________________________________________ For Office Staff:  Number of people involved in session: 1 Was an Intern/ student involved with case: no

## 2021-06-16 ENCOUNTER — Other Ambulatory Visit: Payer: Self-pay

## 2021-06-16 ENCOUNTER — Encounter: Payer: Self-pay | Admitting: Pulmonary Disease

## 2021-06-16 ENCOUNTER — Ambulatory Visit: Payer: Medicare Other | Admitting: Certified Registered"

## 2021-06-16 ENCOUNTER — Ambulatory Visit
Admission: RE | Admit: 2021-06-16 | Discharge: 2021-06-16 | Disposition: A | Discharge status: Home / Self Care | Payer: Medicare Other | Attending: Pulmonary Disease | Admitting: Pulmonary Disease

## 2021-06-16 ENCOUNTER — Encounter
Admission: RE | Disposition: A | Discharge status: Home / Self Care | Payer: Self-pay | Source: Home / Self Care | Attending: Pulmonary Disease

## 2021-06-16 ENCOUNTER — Ambulatory Visit: Admission: RE | Admit: 2021-06-16 | Payer: Medicare Other | Source: Home / Self Care | Admitting: Pulmonary Disease

## 2021-06-16 ENCOUNTER — Ambulatory Visit: Payer: Medicare Other

## 2021-06-16 ENCOUNTER — Encounter: Admission: RE | Payer: Self-pay | Source: Home / Self Care

## 2021-06-16 DIAGNOSIS — Z87891 Personal history of nicotine dependence: Secondary | ICD-10-CM | POA: Diagnosis not present

## 2021-06-16 DIAGNOSIS — Z9049 Acquired absence of other specified parts of digestive tract: Secondary | ICD-10-CM | POA: Diagnosis not present

## 2021-06-16 DIAGNOSIS — R918 Other nonspecific abnormal finding of lung field: Secondary | ICD-10-CM | POA: Diagnosis not present

## 2021-06-16 DIAGNOSIS — Z9011 Acquired absence of right breast and nipple: Secondary | ICD-10-CM | POA: Insufficient documentation

## 2021-06-16 DIAGNOSIS — C3412 Malignant neoplasm of upper lobe, left bronchus or lung: Secondary | ICD-10-CM | POA: Diagnosis not present

## 2021-06-16 DIAGNOSIS — J449 Chronic obstructive pulmonary disease, unspecified: Secondary | ICD-10-CM | POA: Diagnosis not present

## 2021-06-16 DIAGNOSIS — I10 Essential (primary) hypertension: Secondary | ICD-10-CM | POA: Diagnosis not present

## 2021-06-16 DIAGNOSIS — K219 Gastro-esophageal reflux disease without esophagitis: Secondary | ICD-10-CM | POA: Diagnosis not present

## 2021-06-16 DIAGNOSIS — Z90710 Acquired absence of both cervix and uterus: Secondary | ICD-10-CM | POA: Diagnosis not present

## 2021-06-16 DIAGNOSIS — Z8543 Personal history of malignant neoplasm of ovary: Secondary | ICD-10-CM | POA: Diagnosis not present

## 2021-06-16 DIAGNOSIS — Z853 Personal history of malignant neoplasm of breast: Secondary | ICD-10-CM | POA: Diagnosis not present

## 2021-06-16 DIAGNOSIS — I700 Atherosclerosis of aorta: Secondary | ICD-10-CM | POA: Diagnosis not present

## 2021-06-16 HISTORY — PX: VIDEO BRONCHOSCOPY WITH ENDOBRONCHIAL ULTRASOUND: SHX6177

## 2021-06-16 SURGERY — BRONCHOSCOPY, WITH BIOPSY USING ELECTROMAGNETIC NAVIGATION
Anesthesia: General | Laterality: Left

## 2021-06-16 MED ORDER — PROPOFOL 10 MG/ML IV BOLUS
INTRAVENOUS | Status: AC
  Filled 2021-06-16: qty 20

## 2021-06-16 MED ORDER — SODIUM CHLORIDE 0.9 % IV SOLN: Freq: Once | INTRAVENOUS | Status: AC

## 2021-06-16 MED ORDER — CHLORHEXIDINE GLUCONATE 0.12 % MT SOLN
OROMUCOSAL | Status: AC
  Administered 2021-06-16: 15 mL via OROMUCOSAL
  Filled 2021-06-16: qty 15

## 2021-06-16 MED ORDER — FENTANYL CITRATE (PF) 100 MCG/2ML IJ SOLN
INTRAMUSCULAR | Status: AC
  Filled 2021-06-16: qty 2

## 2021-06-16 MED ORDER — DEXAMETHASONE SODIUM PHOSPHATE 10 MG/ML IJ SOLN
INTRAMUSCULAR | Status: DC | PRN
  Administered 2021-06-16: 10 mg via INTRAVENOUS

## 2021-06-16 MED ORDER — IPRATROPIUM-ALBUTEROL 0.5-2.5 (3) MG/3ML IN SOLN: 3.0000 mL | Freq: Once | RESPIRATORY_TRACT | Status: AC

## 2021-06-16 MED ORDER — ROCURONIUM BROMIDE 100 MG/10ML IV SOLN
INTRAVENOUS | Status: DC | PRN
  Administered 2021-06-16 (×2): 50 mg via INTRAVENOUS

## 2021-06-16 MED ORDER — ORAL CARE MOUTH RINSE: 15.0000 mL | Freq: Once | OROMUCOSAL | Status: AC

## 2021-06-16 MED ORDER — IPRATROPIUM-ALBUTEROL 0.5-2.5 (3) MG/3ML IN SOLN
RESPIRATORY_TRACT | Status: AC
  Administered 2021-06-16: 3 mL via RESPIRATORY_TRACT
  Filled 2021-06-16: qty 3

## 2021-06-16 MED ORDER — PROPOFOL 10 MG/ML IV BOLUS
INTRAVENOUS | Status: DC | PRN
  Administered 2021-06-16: 10 mg via INTRAVENOUS
  Administered 2021-06-16: 110 mg via INTRAVENOUS

## 2021-06-16 MED ORDER — LACTATED RINGERS IV SOLN: INTRAVENOUS | Status: DC

## 2021-06-16 MED ORDER — CHLORHEXIDINE GLUCONATE 0.12 % MT SOLN: 15.0000 mL | Freq: Once | OROMUCOSAL | Status: AC

## 2021-06-16 MED ORDER — FAMOTIDINE 20 MG PO TABS: 20.0000 mg | ORAL_TABLET | Freq: Once | ORAL | Status: AC

## 2021-06-16 MED ORDER — ONDANSETRON HCL 4 MG/2ML IJ SOLN
INTRAMUSCULAR | Status: DC | PRN
  Administered 2021-06-16: 4 mg via INTRAVENOUS

## 2021-06-16 MED ORDER — SUGAMMADEX SODIUM 200 MG/2ML IV SOLN
INTRAVENOUS | Status: DC | PRN
  Administered 2021-06-16: 200 mg via INTRAVENOUS

## 2021-06-16 MED ORDER — FENTANYL CITRATE (PF) 100 MCG/2ML IJ SOLN
INTRAMUSCULAR | Status: DC | PRN
  Administered 2021-06-16 (×2): 50 ug via INTRAVENOUS

## 2021-06-16 MED ORDER — LIDOCAINE HCL (CARDIAC) PF 100 MG/5ML IV SOSY
PREFILLED_SYRINGE | INTRAVENOUS | Status: DC | PRN
  Administered 2021-06-16: 100 mg via INTRAVENOUS

## 2021-06-16 MED ORDER — EPHEDRINE SULFATE (PRESSORS) 50 MG/ML IJ SOLN
INTRAMUSCULAR | Status: DC | PRN
  Administered 2021-06-16: 5 mg via INTRAVENOUS
  Administered 2021-06-16: 10 mg via INTRAVENOUS

## 2021-06-16 MED ORDER — FAMOTIDINE 20 MG PO TABS
ORAL_TABLET | ORAL | Status: AC
  Administered 2021-06-16: 20 mg via ORAL
  Filled 2021-06-16: qty 1

## 2021-06-16 NOTE — Transfer of Care (Signed)
Immediate Anesthesia Transfer of Care Note  Patient: Pamela Morales  Procedure(s) Performed: ROBOTIC ASSISTED NAVIGATIONAL BRONCHOSCOPY (Left) POSSIBLE VIDEO BRONCHOSCOPY WITH ENDOBRONCHIAL ULTRASOUND (Left)  Patient Location: PACU  Anesthesia Type:General  Level of Consciousness: awake  Airway & Oxygen Therapy: Patient Spontanous Breathing and Patient connected to nasal cannula oxygen  Post-op Assessment: Report given to RN and Post -op Vital signs reviewed and stable  Post vital signs: Reviewed and stable  Last Vitals:  Vitals Value Taken Time  BP 113/60 06/16/21 1427  Temp 36.2 C 06/16/21 1426  Pulse 70 06/16/21 1430  Resp 22 06/16/21 1430  SpO2 95 % 06/16/21 1430  Vitals shown include unvalidated device data.  Last Pain:  Vitals:   06/16/21 1426  PainSc: Asleep         Complications: No notable events documented.

## 2021-06-16 NOTE — Anesthesia Procedure Notes (Signed)
Procedure Name: Intubation Date/Time: 06/16/2021 12:45 PM  Performed by: Biagio Borg, CRNAPre-anesthesia Checklist: Patient identified, Emergency Drugs available, Suction available and Patient being monitored Patient Re-evaluated:Patient Re-evaluated prior to induction Oxygen Delivery Method: Circle system utilized Preoxygenation: Pre-oxygenation with 100% oxygen Induction Type: IV induction Ventilation: Mask ventilation without difficulty Laryngoscope Size: McGraph and 3 Grade View: Grade I Tube type: Oral Tube size: 8.0 mm Number of attempts: 1 Airway Equipment and Method: Stylet and Oral airway Placement Confirmation: ETT inserted through vocal cords under direct vision, positive ETCO2 and breath sounds checked- equal and bilateral Secured at: 21 cm Tube secured with: Tape Dental Injury: Teeth and Oropharynx as per pre-operative assessment

## 2021-06-16 NOTE — Discharge Instructions (Signed)
AMBULATORY SURGERY  ?DISCHARGE INSTRUCTIONS ? ? ?The drugs that you were given will stay in your system until tomorrow so for the next 24 hours you should not: ? ?Drive an automobile ?Make any legal decisions ?Drink any alcoholic beverage ? ? ?You may resume regular meals tomorrow.  Today it is better to start with liquids and gradually work up to solid foods. ? ?You may eat anything you prefer, but it is better to start with liquids, then soup and crackers, and gradually work up to solid foods. ? ? ?Please notify your doctor immediately if you have any unusual bleeding, trouble breathing, redness and pain at the surgery site, drainage, fever, or pain not relieved by medication. ? ? ? ?Additional Instructions: ? ? ? ?Please contact your physician with any problems or Same Day Surgery at [phone], Monday through Friday 6 am to 4 pm, or [facility] at [facility] [place] Main number at [phone].  ?

## 2021-06-16 NOTE — Op Note (Signed)
PROCEDURES Robotic assisted bronchoscopy Cellvizio probe based confocal laser endomicroscopy (pCLE) - not utilized Augmented fluoroscopy   Indication: Former smoker with left upper lobe lung mass, FDG avid, rule out cancer.  PET/CT showed no mediastinal involvement.  Preoperative Diagnosis: Left upper lobe mass, rule out cancer Post Procedure Diagnosis: Same as above Consent: Verbal/Written: obtained  Benefits, limitations and potential complications of the procedure were discussed with the patient/family.  Complications from bronchoscopy are rare and most often minor, but if they occur they may include breathing difficulty, vocal cord spasm, hoarseness, slight fever, vomiting, dizziness, bronchospasm, infection, low blood oxygen, bleeding from biopsy site, or an allergic reaction to medications.  It is uncommon for patients to experience other more serious complications for example: Collapsed lung requiring chest tube placement, respiratory failure, heart attack and/or cardiac arrhythmia.  Patient understood the potential complications and agreed to proceed.  Surgeon: Renold Don, MD Assistant/Scrub: Annia Belt, RRT Circulator: Liborio Nixon, RRT Anesthesiologist/CRNA: Iran Ouch, MD/Sherri Doy Mince, CRNA Cytotechnology: Maryan Puls, team lead Georgina Snell Fluoroscopy technician: Brayton Caves, RT Representatives: N/A  Type of Anesthesia: General endotracheal  Procedures Performed:   Robotic bronchoscopy: Procedure consists of robotic navigation comprised of electromagnetics, optical pattern recognition and robotic kinematic data - to triangulate bronchoscope location during the procedure and provide accurate positional data to biopsy a lesion. Cellvizio probe based confocal laser endomicroscopy (pCLE) utilizing blue laser endomicroscopy -not utilized during the case due to probe without uses. Augmented fluoroscopy with Body Vision.  Description of Procedure:  Robotic  bronchoscopy: The patient was brought to Procedure Room 2 (Bronchoscopy Suite) in the OR area where appropriate timeout was taken with the staff after the patient was inducted under general anesthesia.  The patient was inducted under general anesthesia and intubated by the anesthesia team.  Patient was intubated with a 8.5 ET tube without difficulty.  Tube was secured at 4 cm above the carina.  A Portex adapter was placed on the ET tube flange.  Once the patient was under adequate general anesthesia the Olympus therapeutic video bronchoscope was advanced and an anatomic airway tour and surveillance bronchoscopy was performed.The distal trachea appeared unremarkable. The main carina was sharp.  There were scant secretions seen in both the right or left mainstem bronchi.  These secretions were lavaged until clear.  The RUL, RLL, RML appeared to be free of endobronchial masses, lesions, or purulent secretions. Likewise, the LLL/LUL appeared to be free of endobronchial masses, lesions, or purulent secretions.  There were some scant benign appearing secretions on the left upper lobe bronchus that were suctioned until cleared.  Once the survey bronchoscopy was completed, registration for the augmented fluoroscopy (Body Vision) was then performed with the fluoroscopic C arm.  Once this was completed, the robotic bronchoscope ET tube adapter was placed and ETT was cut to proper length and secured on the mid plane.  The [facility] robotic scope was then advanced through the ETT and registration was performed successfully.  There was good correlation between the robotic mapping and bronchoscopic mapping. With the assistance of fused navigation, the bronchoscope was advanced to the LUL mass. The tip of the working channel sat within 20 mm of the mass.  Positioning was confirmed with augmented fluoroscopy.  At this point Cellvizio probe based confocal laser endomicroscopy (pCLE) was attempted however there were difficulties as no  image could be generated due to probe issues.  Augmented fluoroscopy via Body Vision was utilized to optimize the position most favorable for biopsies, then the  robotic bronchoscope was anchored to maintain position.  At this point 2 passes with a Periview Flex Olympus needle were performed under fluoroscopic guidance.  The first pass was subjected to ROSE showing only inflammatory debris.  Second pass was placed directly on CytoLyt.  A third pass was made then with an Olympus smooth shot transbronchial biopsy needle and this was subjected to ROSE showing atypical cells.  Second pass was placed in [place].  After confirmation of excellent hemostasis, for passes with a cytology brush on the LUL mass were performed, the last brush was subjected to ROSE and showed findings consistent with lesional cells.  The brushes were then cut into CytoLyt preservative.  Once this was completed, a total of 10 transbronchial biopsies were obtained on the left upper lobe mass.  After completion of the biopsies a targeted BAL was performed at this segment, which sent for cytology analysis.  For BAL sample acquisition purposes, 20 ml of normal saline were instilled, and approximately 6 ml were recovered/trapped and sent for analysis.   The robotic bronchoscope was then retracted all the way out after confirmation of excellent hemostasis.  The patient received bronchial lavage with 10 mL of 1% lidocaine via the ET tube. The patient tolerated the procedure well. No significant bleeding was observed at the conclusion of the procedure.  At this point, the patient was allowed to emerge from general anesthesia, and was extubated in the procedure room without incident.  The patient  was taken to the PACU in satisfactory condition.  Auscultation of the lungs showed no change from pre bronchoscopy examination.  Patient tolerated the procedure well with no untoward effects of anesthesia noted.   Specimens Obtained:  Transbronchial Forceps  Biopsy: X 10, left upper lobe  Transbronchial Brush: X 4 (total 2 brushes utilized), left upper lobe  Transbronchial needle aspirate: X 4  Targeted BAL: 6 mL  Fluoroscopy: Augmented fluoroscopy (Body Vision) was utilized during the course of this procedure to assure that biopsies were taken in a safe manner under fluoroscopic guidance with spot films required.  Total fluoroscopy time: 5 minutes 11 seconds, total dose 38.56 mGy.  Intraoperative images:        Complications:None, no pneumothorax on post film:   Estimated Blood Loss: Nil    Assessment and Plan/Additional Comments: Left upper lobe mass, await path report, initial ROSE consistent with lesional cells  Await pathology reports Follow-up with oncology and pulmonary as scheduled     C. Derrill Kay, MD Advanced Bronchoscopy PCCM [facility]    *This note was dictated using voice recognition software/Dragon.  Despite best efforts to proofread, errors can occur which can change the meaning.  Any change was purely unintentional.

## 2021-06-16 NOTE — Anesthesia Preprocedure Evaluation (Addendum)
Anesthesia Evaluation  Patient identified by MRN, date of birth, ID band Patient awake    Reviewed: Allergy & Precautions, NPO status , Patient's Chart, lab work & pertinent test results  History of Anesthesia Complications (+) history of anesthetic complications (deviated septum-lost memory for about 1 week after anesthesia)  Airway Mallampati: III  TM Distance: >3 FB Neck ROM: full    Dental no notable dental hx. (+) Chipped   Pulmonary COPD, former smoker,  3.2 cm spiculated left upper lobe mass with enlarged pretracheal lymph node   Pulmonary exam normal        Cardiovascular Exercise Tolerance: Good hypertension, Pt. on medications (-) anginaNormal cardiovascular exam     Neuro/Psych PSYCHIATRIC DISORDERS Depression negative neurological ROS     GI/Hepatic Neg liver ROS, GERD  ,  Endo/Other  negative endocrine ROS  Renal/GU CRFRenal disease     Musculoskeletal  (+) Arthritis ,   Abdominal   Peds  Hematology  (+) Blood dyscrasia, anemia ,   Anesthesia Other Findings C1 fracture after a fall on 06/06/21. Cleared by neurosurgery without collar requirements.   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 02/2008: Cancer ([place])     Comment:  R breast  (est rec neg and brac neg) No date: Cataract     Comment:  FORMING  No date: Complication of anesthesia     Comment:  deviated septum-lost memory for about 1 week after               anesthesia No date: COPD (chronic obstructive pulmonary disease) ([place]) No date: DDD (degenerative disc disease)     Comment:  in back with chronic pain No date: Depression No date: Diverticulosis No date: Dyspnea      Comment:  with exertion No date: Eczema No date: Family history of colon cancer No date: GERD (gastroesophageal reflux disease) No date: Headache     Comment:  h/o migraines No date: Hypertension No date: Mass of left lung No date: Osteopenia No date: Rosacea 02/2007: Zoster  Past Surgical History: 1996: ABDOMINAL HYSTERECTOMY     Comment:  BSO fibroids, incidental ovarian CA finding 09/2016: BASAL CELL CARCINOMA EXCISION No date: bladder tack No date: BREAST SURGERY     Comment:  rt total mastectomy for breast CA 08/1996: CHOLECYSTECTOMY     Comment:  10 yrs ago No date: COLONOSCOPY 11/2005: lipoma removal No date: NASAL SEPTUM SURGERY No date: POLYPECTOMY     Reproductive/Obstetrics negative OB ROS                            Anesthesia Physical Anesthesia Plan  ASA: 3  Anesthesia Plan: General ETT   Post-op Pain Management: Minimal or no pain anticipated   Induction: Intravenous  PONV Risk Score and Plan: Ondansetron, Dexamethasone and Treatment may vary due to age or medical condition  Airway Management Planned: Oral ETT  Additional Equipment:   Intra-op Plan:   Post-operative Plan:   Informed Consent: I have reviewed the patients History and Physical, chart, labs and discussed the procedure including the risks, benefits and  alternatives for the proposed anesthesia with the patient or authorized representative who has indicated his/her understanding and acceptance.     Dental Advisory Given and Dental advisory given  Plan Discussed with: Anesthesiologist, CRNA and Surgeon  Anesthesia Plan Comments:        Anesthesia Quick Evaluation

## 2021-06-16 NOTE — Interval H&P Note (Signed)
Patient presents for ROBOTIC ASSISTED NAVIGATIONAL BRONCHOSCOPY for evaluation of LEFT upper lobe lung mass.  Benefits, limitations and potential complications of the procedure were discussed with the patient/family.  Complications from bronchoscopy are rare and most often minor, but if they occur they may include breathing difficulty, vocal cord spasm, hoarseness, slight fever, vomiting, dizziness, bronchospasm, infection, low blood oxygen, bleeding from biopsy site, or an allergic reaction to medications.  It is uncommon for patients to experience other more serious complications for example: Collapsed lung requiring chest tube placement, respiratory failure, heart attack and/or cardiac arrhythmia.  Patient agrees to proceed.   Renold Don, MD Advanced Bronchoscopy PCCM [facility]    *This note was dictated using voice recognition software/Dragon.  Despite best efforts to proofread, errors can occur which can change the meaning. Any transcriptional errors that result from this process are unintentional and may not be fully corrected at the time of dictation.

## 2021-06-16 NOTE — Anesthesia Postprocedure Evaluation (Signed)
Anesthesia Post Note  Patient: Macky Lower Briner  Procedure(s) Performed: ROBOTIC ASSISTED NAVIGATIONAL BRONCHOSCOPY (Left) POSSIBLE VIDEO BRONCHOSCOPY WITH ENDOBRONCHIAL ULTRASOUND (Left)  Patient location during evaluation: PACU Anesthesia Type: General Level of consciousness: awake and alert, oriented and patient cooperative Pain management: pain level controlled Vital Signs Assessment: post-procedure vital signs reviewed and stable Respiratory status: spontaneous breathing, nonlabored ventilation and respiratory function stable Cardiovascular status: blood pressure returned to baseline and stable Postop Assessment: adequate PO intake Anesthetic complications: no   No notable events documented.   Last Vitals:  Vitals:   06/16/21 1445 06/16/21 1500  BP: 130/61 (!) 127/58  Pulse: 66 69  Resp: 18 19  Temp:  (!) 36.3 C  SpO2: 98% 96%    Last Pain:  Vitals:   06/16/21 1500  PainSc: 0-No pain                 Darrin Nipper

## 2021-06-17 ENCOUNTER — Encounter: Payer: Self-pay | Admitting: Pulmonary Disease

## 2021-06-17 ENCOUNTER — Encounter (INDEPENDENT_AMBULATORY_CARE_PROVIDER_SITE_OTHER): Payer: Medicare Other | Admitting: Ophthalmology

## 2021-06-17 ENCOUNTER — Other Ambulatory Visit: Payer: Self-pay | Admitting: Pathology

## 2021-06-17 DIAGNOSIS — H43813 Vitreous degeneration, bilateral: Secondary | ICD-10-CM

## 2021-06-17 DIAGNOSIS — H353132 Nonexudative age-related macular degeneration, bilateral, intermediate dry stage: Secondary | ICD-10-CM | POA: Diagnosis not present

## 2021-06-17 DIAGNOSIS — H35033 Hypertensive retinopathy, bilateral: Secondary | ICD-10-CM | POA: Diagnosis not present

## 2021-06-17 DIAGNOSIS — H34812 Central retinal vein occlusion, left eye, with macular edema: Secondary | ICD-10-CM

## 2021-06-17 DIAGNOSIS — I10 Essential (primary) hypertension: Secondary | ICD-10-CM

## 2021-06-17 LAB — CYTOLOGY - NON PAP

## 2021-06-17 LAB — SURGICAL PATHOLOGY

## 2021-06-18 DIAGNOSIS — C3492 Malignant neoplasm of unspecified part of left bronchus or lung: Secondary | ICD-10-CM | POA: Insufficient documentation

## 2021-06-18 NOTE — Progress Notes (Unsigned)
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  INTERVAL HISTORY: Patient returns to clinic today for further evaluation, discussion of her biopsy results, and treatment planning.  She continues to feel well and remains asymptomatic. She has no neurologic complaints.  She denies any fevers.  She has a good appetite and denies weight loss.  She has no chest pain, shortness of breath, cough, or hemoptysis.  She denies any nausea, vomiting, constipation, or diarrhea.  She has no urinary complaints.  Patient offers no specific complaints today.  REVIEW OF SYSTEMS:   Review of Systems  Constitutional: Negative.  Negative for fever, malaise/fatigue and weight loss.  Respiratory: Negative.  Negative for cough and shortness of breath.   Cardiovascular: Negative.  Negative for chest pain and leg swelling.  Gastrointestinal: Negative.  Negative for abdominal pain.  Genitourinary: Negative.  Negative for dysuria.  Musculoskeletal: Negative.  Negative for back pain.  Skin: Negative.  Negative for rash.  Neurological: Negative.  Negative for dizziness, focal weakness, weakness and headaches.  Psychiatric/Behavioral: Negative.  The patient is not nervous/anxious.     As per HPI. Otherwise, a complete review of systems is negative.  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    Cancer ([place]) 02/2008   R breast  (est rec neg and brac neg)   Cataract    FORMING    Complication of anesthesia    deviated septum-lost memory for about 1 week after anesthesia   COPD (chronic obstructive pulmonary disease) ([place])    DDD (degenerative disc disease)    in back with chronic pain   Depression    Diverticulosis    Dyspnea    with exertion   Eczema    Family history of colon cancer    GERD (gastroesophageal reflux disease)    Headache    h/o migraines   Hypertension    Mass of left lung    Osteopenia    Rosacea    [place] 02/2007    PAST SURGICAL HISTORY: Past Surgical History:  Procedure Laterality Date   ABDOMINAL HYSTERECTOMY  1996   BSO fibroids, incidental ovarian CA finding   BASAL CELL CARCINOMA EXCISION  09/2016   bladder tack     BREAST SURGERY     rt total mastectomy for breast CA   CHOLECYSTECTOMY  08/1996   10 yrs ago   COLONOSCOPY     lipoma removal  11/2005   NASAL SEPTUM SURGERY     POLYPECTOMY     VIDEO BRONCHOSCOPY WITH ENDOBRONCHIAL ULTRASOUND Left 06/16/2021   Procedure: POSSIBLE VIDEO BRONCHOSCOPY WITH ENDOBRONCHIAL ULTRASOUND;  Surgeon: Tyler Pita, MD;  Location: [facility] ORS;  Service: Pulmonary;  Laterality: Left;    FAMILY HISTORY: Family  History  Problem Relation Age of Onset   Hypertension Mother    Heart disease Mother        CAD   Stomach cancer Mother 90   Lung cancer Father 22   Cancer Sister        colon, skin   Heart disease Sister 27       MI   Diabetes Sister    Colon cancer Sister    Head & neck cancer Sister 11   Lung cancer Sister    Melanoma Sister        multiple on head   Heart disease Brother        HTN and MI   Hypertension Brother    Cancer Maternal Aunt        unk type   Cancer Paternal Aunt        unk type   Cancer Paternal Uncle        unk  type   Colon cancer Daughter 69   Esophageal cancer Neg Hx    Colon polyps Neg Hx     ADVANCED DIRECTIVES (Y/N):  N  HEALTH MAINTENANCE: Social History   Tobacco Use   Smoking status: Former    Packs/day: 2.00    Years: 10.00    Total pack years: 20.00    Types: Cigarettes    Quit date: 01/03/1990    Years since quitting: 31.4   Smokeless tobacco: Never  Vaping Use   Vaping Use: Never used  Substance Use Topics   Alcohol use: Yes    Alcohol/week: 7.0 standard drinks of alcohol    Types: 5 Glasses of wine, 2 Standard drinks or equivalent per week    Comment: occ glass of wine   Drug use: No     Colonoscopy:  PAP:  Bone density:  Lipid panel:  Allergies  Allergen Reactions   Sulfa Antibiotics     Pt unsure of reaction    Current Outpatient Medications  Medication Sig Dispense Refill   amLODipine (NORVASC) 10 MG tablet TAKE 1 TABLET BY MOUTH EVERY DAY (Patient taking differently: Take 10 mg by mouth at bedtime.) 90 tablet 1   Calcium Carbonate-Vitamin D (CALTRATE 600+D PO) Take 1,200 mg by mouth every evening.      Cyanocobalamin (B-12 PO) Take 5,000 mg by mouth once a week. (B-12)     Doxylamine Succinate, Sleep, (UNISOM PO) Take 50 mg by mouth at bedtime.     Ferrous Sulfate (IRON) 325 (65 Fe) MG TABS Take 1 tablet by mouth daily.     FLUoxetine (PROZAC) 20 MG capsule TAKE 2 CAPSULES BY MOUTH EVERY DAY (Patient taking differently: Take 40 mg by mouth every morning.) 180 capsule 1   Fluticasone-Umeclidin-Vilant (TRELEGY ELLIPTA) 100-62.5-25 MCG/ACT AEPB Inhale 1 puff into the lungs daily. (Patient taking differently: Inhale 1 puff into the lungs every morning.) 28 each 0   losartan (COZAAR) 50 MG tablet TAKE 1 TABLET BY MOUTH EVERY DAY (Patient taking differently: Take 50 mg by mouth every morning.) 90 tablet 1   MAGNESIUM PO Take 500 mg by mouth daily.     MELATONIN PO Take 10 mg by mouth at bedtime.     Multiple Vitamin (MULTIVITAMIN) tablet Take 1 tablet by mouth  daily.     Multiple Vitamins-Minerals (PRESERVISION AREDS 2 PO) Take 1 capsule by mouth 2 (two) times daily.     Secukinumab, 300 MG Dose, (COSENTYX, 300 MG DOSE,) 150 MG/ML SOSY Inject 1 application as directed every 30 (  thirty) days.     No current facility-administered medications for this visit.    OBJECTIVE: Vitals:   06/23/21 1107  BP: (!) 106/58  Pulse: 65  Temp: 98.1 F (36.7 C)     Body mass index is 25.86 kg/m.    ECOG FS:0 - Asymptomatic  General: Well-developed, well-nourished, no acute distress. Eyes: Pink conjunctiva, anicteric sclera. HEENT: Normocephalic, moist mucous membranes. Lungs: No audible wheezing or coughing. Heart: Regular rate and rhythm. Abdomen: Soft, nontender, no obvious distention. Musculoskeletal: No edema, cyanosis, or clubbing. Neuro: Alert, answering all questions appropriately. Cranial nerves grossly intact. Skin: No rashes or petechiae noted. Psych: Normal affect.  LAB RESULTS:  Lab Results  Component Value Date   NA 137 05/20/2021   K 4.4 05/20/2021   CL 103 05/20/2021   CO2 27 05/20/2021   GLUCOSE 103 (H) 05/20/2021   BUN 24 (H) 05/20/2021   CREATININE 1.37 (H) 05/20/2021   CALCIUM 9.2 05/20/2021   PROT 7.8 05/20/2021   ALBUMIN 3.8 05/20/2021   AST 18 05/20/2021   ALT 15 05/20/2021   ALKPHOS 70 05/20/2021   BILITOT 0.4 05/20/2021   GFRNONAA 39 (L) 05/20/2021   GFRAA (L) 05/07/2008    56        The eGFR has been calculated using the MDRD equation. This calculation has not been validated in all clinical situations. eGFR's persistently <60 mL/min signify possible Chronic Kidney Disease.    Lab Results  Component Value Date   WBC 9.3 05/20/2021   NEUTROABS 6.2 05/20/2021   HGB 11.0 (L) 05/20/2021   HCT 33.9 (L) 05/20/2021   MCV 83.3 05/20/2021   PLT 300 05/20/2021     STUDIES: DG Chest Port 1 View  Result Date: 06/16/2021 CLINICAL DATA:  Status post bronchoscopy. EXAM: PORTABLE CHEST 1 VIEW COMPARISON:  Chest  two views 05/11/2021; CT chest 06/15/2021 FINDINGS: Cardiac silhouette and mediastinal contours are unchanged and within normal limits with moderate calcification again seen within aortic arch. There is a diffuse opacity within the left lung apex indicating the lung mass measuring up to 3.5 cm on yesterday's CT. Mild bilateral chronic interstitial thickening is similar to prior. No pleural effusion or pneumothorax. Right anterior abdominal and chest wall and right axillary surgical clips are again seen. No acute osseous abnormality. IMPRESSION: 1. No pneumothorax. 2. Redemonstration of left upper lobe lung mass again suspicious for bronchogenic carcinoma. Electronically Signed   By: Yvonne Kendall M.D.   On: 06/16/2021 15:01   DG C-Arm 1-60 Min-No Report  Result Date: 06/16/2021 Fluoroscopy was utilized by the requesting physician.  No radiographic interpretation.   CT SUPER D CHEST WO MONARCH PILOT  Result Date: 06/16/2021 CLINICAL DATA:  81 year old female presenting for follow-up of lung nodule with shortness of breath due to COPD. * Tracking Code: BO * EXAM: CT CHEST WITHOUT CONTRAST TECHNIQUE: Multidetector CT imaging of the chest was performed using thin slice collimation for electromagnetic bronchoscopy planning purposes, without intravenous contrast. RADIATION DOSE REDUCTION: This exam was performed according to the departmental dose-optimization program which includes automated exposure control, adjustment of the mA and/or kV according to patient size and/or use of iterative reconstruction technique. COMPARISON:  May 14, 2021 and PET exam May 26, 2021. FINDINGS: Cardiovascular: Calcified atheromatous plaque of the thoracic aorta with normal aortic caliber. 3.5 cm main pulmonary artery. Normal heart size without pericardial effusion or signs of pericardial thickening. Mediastinum/Nodes: Redemonstration of LEFT thyroid lesion measuring up to 3.7 cm. No thoracic inlet adenopathy. No axillary  adenopathy.  Post RIGHT mastectomy and breast reconstruction with surgical clips in the breast and axilla. No mediastinal adenopathy. No hilar adenopathy. Esophagus grossly normal. Lungs/Pleura: LEFT upper lobe spiculated mass measures 3.5 x 2.8 cm measuring approximately 3.0 cm greatest dimension on previous imaging. Spiculations extend to the pleural surface of the peripheral LEFT apical upper lobe and there is peripheral septal thickening about the lesion. Resolution of other areas of nodularity in the chest when compared to recent CT imaging. No pleural effusion. Airways are patent. Upper Abdomen: Incidental imaging of upper abdominal contents with dense liver up to 70 Hounsfield units. Post cholecystectomy, liver and biliary tree incompletely imaged. Imaged portions of pancreas, spleen, adrenal glands and kidneys are unremarkable. Musculoskeletal: Spinal degenerative changes. No acute or destructive bone finding. IMPRESSION: Enlarging LEFT upper lobe mass remains suspicious for bronchogenic neoplasm. Referral to multi disciplinary thoracic oncologic setting is suggested if not yet performed. Surrounding septal thickening does not allow for exclusion of lymphangitic spread of tumor. Resolution of other areas of patchy opacity in the chest presumably resolving infection or inflammation. LEFT thyroid lesion as outlined previously. Recommend thyroid ultrasound if not yet performed. Aortic Atherosclerosis (ICD10-I70.0). Electronically Signed   By: Zetta Bills M.D.   On: 06/16/2021 11:57   CT Head Wo Contrast  Result Date: 06/06/2021 CLINICAL DATA:  Head and neck trauma, fall, laceration to right temporal area EXAM: CT HEAD WITHOUT CONTRAST CT CERVICAL SPINE WITHOUT CONTRAST TECHNIQUE: Multidetector CT imaging of the head and cervical spine was performed following the standard protocol without intravenous contrast. Multiplanar CT image reconstructions of the cervical spine were also generated. RADIATION DOSE REDUCTION: This  exam was performed according to the departmental dose-optimization program which includes automated exposure control, adjustment of the mA and/or kV according to patient size and/or use of iterative reconstruction technique. COMPARISON:  None Available. FINDINGS: CT HEAD FINDINGS Brain: No evidence of acute infarction, hemorrhage, cerebral edema, mass, mass effect, or midline shift. No hydrocephalus or extra-axial fluid collection. Vascular: No hyperdense vessel. Skull: Normal. Negative for fracture or focal lesion. Sinuses/Orbits: No acute finding. Other: The mastoid air cells are well aerated. CT CERVICAL SPINE FINDINGS Alignment: Straightening of the normal cervical lordosis. Trace retrolisthesis of C6 on C7, which appears facet mediated. Skull base and vertebrae: Minimally displaced fracture of the posteroinferior endplate of the C1 right lateral mass (series 6, image 25 and series 3, image 23), which is favored to be acute. Mild asymmetry of the C1 lateral masses is favored to be secondary head position. No additional fracture or suspicious osseous lesion. Soft tissues and spinal canal: No prevertebral fluid or swelling. No visible canal hematoma. Disc levels: Multilevel degenerative changes most prominently at C5-C6, where there is mild spinal canal stenosis. Multilevel uncovertebral and facet arthropathy which is also worst at C5-C6, where there is severe right neural foraminal narrowing. Upper chest: Mild opacities in the left apex are likely related to the known left lung mass. No acute pulmonary opacity or pleural effusion. Other: Redemonstrated 3.1 x 2.3 x 3.8 cm hypoenhancing nodule in the left thyroid lobe. IMPRESSION: 1. Minimally displaced fracture of the posteroinferior endplate of the C1 right lateral mass, favored to be acute. Mild asymmetry of the C1 lateral masses is favored to be secondary to head position. No additional fracture is seen. No listhesis. 2.  No acute intracranial process. 3.  Redemonstrated left thyroid lobe hypoenhancing nodule which was not hypermetabolic on recent PET. These results were called by telephone at the time of  interpretation on 06/06/2021 at 5:58 pm to provider RILEY RANSOM , who verbally acknowledged these results. Electronically Signed   By: Merilyn Baba M.D.   On: 06/06/2021 17:59   CT Cervical Spine Wo Contrast  Result Date: 06/06/2021 CLINICAL DATA:  Head and neck trauma, fall, laceration to right temporal area EXAM: CT HEAD WITHOUT CONTRAST CT CERVICAL SPINE WITHOUT CONTRAST TECHNIQUE: Multidetector CT imaging of the head and cervical spine was performed following the standard protocol without intravenous contrast. Multiplanar CT image reconstructions of the cervical spine were also generated. RADIATION DOSE REDUCTION: This exam was performed according to the departmental dose-optimization program which includes automated exposure control, adjustment of the mA and/or kV according to patient size and/or use of iterative reconstruction technique. COMPARISON:  None Available. FINDINGS: CT HEAD FINDINGS Brain: No evidence of acute infarction, hemorrhage, cerebral edema, mass, mass effect, or midline shift. No hydrocephalus or extra-axial fluid collection. Vascular: No hyperdense vessel. Skull: Normal. Negative for fracture or focal lesion. Sinuses/Orbits: No acute finding. Other: The mastoid air cells are well aerated. CT CERVICAL SPINE FINDINGS Alignment: Straightening of the normal cervical lordosis. Trace retrolisthesis of C6 on C7, which appears facet mediated. Skull base and vertebrae: Minimally displaced fracture of the posteroinferior endplate of the C1 right lateral mass (series 6, image 25 and series 3, image 23), which is favored to be acute. Mild asymmetry of the C1 lateral masses is favored to be secondary head position. No additional fracture or suspicious osseous lesion. Soft tissues and spinal canal: No prevertebral fluid or swelling. No visible canal  hematoma. Disc levels: Multilevel degenerative changes most prominently at C5-C6, where there is mild spinal canal stenosis. Multilevel uncovertebral and facet arthropathy which is also worst at C5-C6, where there is severe right neural foraminal narrowing. Upper chest: Mild opacities in the left apex are likely related to the known left lung mass. No acute pulmonary opacity or pleural effusion. Other: Redemonstrated 3.1 x 2.3 x 3.8 cm hypoenhancing nodule in the left thyroid lobe. IMPRESSION: 1. Minimally displaced fracture of the posteroinferior endplate of the C1 right lateral mass, favored to be acute. Mild asymmetry of the C1 lateral masses is favored to be secondary to head position. No additional fracture is seen. No listhesis. 2.  No acute intracranial process. 3. Redemonstrated left thyroid lobe hypoenhancing nodule which was not hypermetabolic on recent PET. These results were called by telephone at the time of interpretation on 06/06/2021 at 5:58 pm to provider RILEY RANSOM , who verbally acknowledged these results. Electronically Signed   By: Merilyn Baba M.D.   On: 06/06/2021 17:59   NM PET Image Initial (PI) Skull Base To Thigh  Result Date: 05/28/2021 CLINICAL DATA:  Initial treatment strategy for left upper lobe lung mass. History of right breast cancer and ovarian cancer. EXAM: NUCLEAR MEDICINE PET SKULL BASE TO THIGH TECHNIQUE: 7.24 mCi F-18 FDG was injected intravenously. Full-ring PET imaging was performed from the skull base to thigh after the radiotracer. CT data was obtained and used for attenuation correction and anatomic localization. Fasting blood glucose: 97 mg/dl COMPARISON:  Chest CT 05/14/2021, radiographs 05/11/2021 and abdominopelvic CT 10/27/2005 FINDINGS: Mediastinal blood pool activity: SUV max 2.3 NECK: No hypermetabolic cervical lymph nodes are identified.Activity within the lymphoid tissue of Waldeyer's ring is within physiologic limits. No suspicious uptake within the  pharyngeal mucosal space. Incidental CT findings: Mild bilateral carotid atherosclerosis. As seen on recent chest CT, there is a left thyroid nodule measuring 3.3 x 2.2 cm on image  54/2, without hypermetabolic activity. CHEST: There are no hypermetabolic mediastinal, hilar or axillary lymph nodes. Intense hypermetabolic activity within the previously demonstrated left upper lobe mass (SUV max 20.6). This mass measures approximately 3.2 x 2.5 cm on image 65/2, not significantly changed from recent chest CT. No other hypermetabolic pulmonary activity or suspicious nodularity. Pulmonary assessment limited by breathing artifact. However, the right lower lobe opacity seen on the recent chest CT are no longer clearly visualized. Postsurgical changes in the right breast from mastectomy and breast reconstruction. Incidental CT findings: Atherosclerosis of the aorta, great vessels and coronary arteries. Mild centrilobular emphysema. ABDOMEN/PELVIS: There is no hypermetabolic activity within the liver, adrenal glands, spleen or pancreas. There is no hypermetabolic nodal activity. Incidental CT findings: Supraumbilical hernia containing only fat. Previous cholecystectomy and hysterectomy. Diffuse aortic and Tadlock vessel atherosclerosis. Left-sided IVC noted incidentally. SKELETON: There is no hypermetabolic activity to suggest osseous metastatic disease. Incidental CT findings: Mild spondylosis. Bilateral L5 pars defects with resulting grade 2 anterolisthesis and moderate foraminal narrowing bilaterally at L5-S1. IMPRESSION: 1. The recently demonstrated left upper lobe mass is intensely hypermetabolic, most consistent with bronchogenic carcinoma. No evidence of nodal or distant metastases. Assuming bronchogenic carcinoma, these findings are consistent with stage IB disease (T2a N0 M0). 2. No other suspicious metabolic activity. 3. The recently demonstrated left thyroid nodule demonstrates no hypermetabolic activity. However,  Recommend thyroid US.(Ref: J Am Coll Radiol. 2015 Feb;12(2): 143-50). 4. Bilateral L5 pars defects with anterolisthesis and biforaminal narrowing at L5-S1. 5. Coronary and aortic atherosclerosis (ICD10-I70.0). Emphysema (ICD10-J43.9). Electronically Signed   By: Richardean Sale M.D.   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  Biopsy confirmed the results.  We discussed the option of surgical resection as well as radiation treatment and patient has elected to pursue XRT.  No chemotherapy is necessary.  A referral has been sent to radiation oncology.  Patient will return to clinic at the end of her treatments for further evaluation and discussion of long-term monitoring and follow-up.   History of breast cancer: Greater than 15 years ago.  Patient states she underwent mastectomy, but did not receive any XRT, chemotherapy, or hormonal treatment. History of ovarian cancer: Patient reports this was an incidental on pathology after having a total hysterectomy. History of melanoma: Unclear depth or stage. Right lower lobe pulmonary nodule: PET scan as above. Thyroid nodule: PET scan as above.  Patient will likely need a thyroid ultrasound to further identify. Genetics: Referral has been placed to genetics and results are pending at time of dictation.  I spent a total of 30 minutes reviewing chart data, face-to-face evaluation with the patient, counseling and coordination of care as detailed above.    Patient expressed understanding and was in agreement with this plan. She also understands that She can call clinic at any time with any questions, concerns, or complaints.    Cancer Staging  Squamous cell carcinoma of left lung [facility]) Staging form: Lung, AJCC 8th Edition - Clinical  stage from 06/23/2021: Stage IB (cT2a, cN0, cM0) - Signed by Lloyd Huger, MD on 06/23/2021   Lloyd Huger, MD   06/23/2021 12:29 PM

## 2021-06-23 ENCOUNTER — Inpatient Hospital Stay: Payer: Medicare Other | Admitting: *Deleted

## 2021-06-23 ENCOUNTER — Encounter: Payer: Self-pay | Admitting: Oncology

## 2021-06-23 ENCOUNTER — Encounter: Payer: Self-pay | Admitting: *Deleted

## 2021-06-23 ENCOUNTER — Inpatient Hospital Stay: Payer: Medicare Other | Admitting: Oncology

## 2021-06-23 ENCOUNTER — Other Ambulatory Visit: Payer: Self-pay

## 2021-06-23 DIAGNOSIS — Z85820 Personal history of malignant melanoma of skin: Secondary | ICD-10-CM | POA: Diagnosis not present

## 2021-06-23 DIAGNOSIS — Z90710 Acquired absence of both cervix and uterus: Secondary | ICD-10-CM | POA: Diagnosis not present

## 2021-06-23 DIAGNOSIS — Z808 Family history of malignant neoplasm of other organs or systems: Secondary | ICD-10-CM | POA: Diagnosis not present

## 2021-06-23 DIAGNOSIS — C3412 Malignant neoplasm of upper lobe, left bronchus or lung: Secondary | ICD-10-CM | POA: Diagnosis not present

## 2021-06-23 DIAGNOSIS — Z8543 Personal history of malignant neoplasm of ovary: Secondary | ICD-10-CM | POA: Diagnosis not present

## 2021-06-23 DIAGNOSIS — C3492 Malignant neoplasm of unspecified part of left bronchus or lung: Secondary | ICD-10-CM

## 2021-06-23 DIAGNOSIS — Z9010 Acquired absence of unspecified breast and nipple: Secondary | ICD-10-CM | POA: Diagnosis not present

## 2021-06-23 DIAGNOSIS — E041 Nontoxic single thyroid nodule: Secondary | ICD-10-CM | POA: Diagnosis not present

## 2021-06-23 DIAGNOSIS — Z87891 Personal history of nicotine dependence: Secondary | ICD-10-CM | POA: Diagnosis not present

## 2021-06-23 DIAGNOSIS — R918 Other nonspecific abnormal finding of lung field: Secondary | ICD-10-CM | POA: Diagnosis not present

## 2021-06-23 DIAGNOSIS — Z853 Personal history of malignant neoplasm of breast: Secondary | ICD-10-CM | POA: Diagnosis not present

## 2021-06-23 DIAGNOSIS — Z809 Family history of malignant neoplasm, unspecified: Secondary | ICD-10-CM | POA: Diagnosis not present

## 2021-06-23 DIAGNOSIS — Z801 Family history of malignant neoplasm of trachea, bronchus and lung: Secondary | ICD-10-CM | POA: Diagnosis not present

## 2021-06-23 DIAGNOSIS — Z800 Family history of malignant neoplasm of digestive organs: Secondary | ICD-10-CM | POA: Diagnosis not present

## 2021-06-23 NOTE — Progress Notes (Signed)
Multidisciplinary Oncology Council Documentation  Pamela Morales was presented by our [facility] on 06/23/2021, which included representatives from:  Palliative Care Dietitian  Physical/Occupational Therapist Nurse Navigator Genetics Speech Therapist Social work Survivorship RN Financial Navigator Research RN   Pamela Morales currently presents with history of lung cancer.  We reviewed previous medical and familial history, history of present illness, and recent lab results along with all available histopathologic and imaging studies. The [name] considered available treatment options and made the following recommendations/referrals:  None at this time.  The MOC is a meeting of clinicians from various specialty areas who evaluate and discuss patients for whom a multidisciplinary approach is being considered. Final determinations in the plan of care are those of the provider(s).   Today's extended care, comprehensive team conference, Pamela Morales was not present for the discussion and was not examined.

## 2021-06-24 ENCOUNTER — Other Ambulatory Visit: Payer: Medicare Other

## 2021-06-24 NOTE — Progress Notes (Signed)
Tumor Board Documentation  Pamela Morales was presented by Dr [name] at our Tumor Board on 06/24/2021, which included representatives from medical oncology, pulmonology, radiology, pathology, radiation oncology, navigation, research, internal medicine, pharmacy, genetics, surgical.  Pamela Morales currently presents as a current patient, for [place], for new positive pathology with history of the following treatments: active survellience, surgical intervention(s). History of Breast, Ovarian, and Melanoma Cancers  Additionally, we reviewed previous medical and familial history, history of present illness, and recent lab results along with all available histopathologic and imaging studies. The tumor board considered available treatment options and made the following recommendations: Radiation therapy (primary modality), Additional screening (Thyroid US) referred to [facility] and seen.  The following procedures/referrals were also placed: No orders of the defined types were placed in this encounter.   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  Tumor board is a meeting of clinicians from various specialty areas who evaluate and discuss patients for whom a multidisciplinary approach is being considered. Final determinations in the plan of care are those of the provider(s). The responsibility for follow up of recommendations given during tumor board is that of the provider.   Today's extended care, comprehensive team conference, Pamela Morales was not present for the discussion and was not examined.   Multidisciplinary Tumor Board is a multidisciplinary case peer review process.  Decisions discussed in the Multidisciplinary Tumor Board reflect the opinions of the specialists present at the conference without having examined the  patient.  Ultimately, treatment and diagnostic decisions rest with the primary provider(s) and the patient.

## 2021-07-01 ENCOUNTER — Encounter: Payer: Self-pay | Admitting: Radiation Oncology

## 2021-07-01 ENCOUNTER — Ambulatory Visit
Admission: RE | Admit: 2021-07-01 | Discharge: 2021-07-01 | Disposition: A | Discharge status: Home / Self Care | Payer: Medicare Other | Source: Ambulatory Visit | Attending: Radiation Oncology | Admitting: Radiation Oncology

## 2021-07-01 ENCOUNTER — Telehealth: Payer: Self-pay | Admitting: Licensed Clinical Social Worker

## 2021-07-01 ENCOUNTER — Encounter: Payer: Self-pay | Admitting: *Deleted

## 2021-07-01 VITALS — BP 131/68 | HR 57 | Temp 97.5°F | Resp 20 | Wt 133.3 lb

## 2021-07-01 DIAGNOSIS — C3492 Malignant neoplasm of unspecified part of left bronchus or lung: Secondary | ICD-10-CM

## 2021-07-01 DIAGNOSIS — Z87891 Personal history of nicotine dependence: Secondary | ICD-10-CM | POA: Insufficient documentation

## 2021-07-01 DIAGNOSIS — C3412 Malignant neoplasm of upper lobe, left bronchus or lung: Secondary | ICD-10-CM | POA: Insufficient documentation

## 2021-07-01 NOTE — Consult Note (Signed)
NEW PATIENT EVALUATION  Name: Pamela Morales  MRN: [id number]  Date:   07/01/2021     DOB: 1940/01/27   This 81 y.o. 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  REFERRING PHYSICIAN: Tower, Wynelle Fanny, MD  CHIEF COMPLAINT:  Chief Complaint  Patient presents with   Lung Cancer    DIAGNOSIS: The encounter diagnosis was Squamous cell carcinoma of left lung ([place]).   PREVIOUS INVESTIGATIONS:  PET scan and CT scans reviewed Clinical notes reviewed Pathology reports reviewed  HPI: Patient is a 81 year old female who presents with a persistent cough and some shortness of breath was found on work-up to have a left upper lobe lesion.  CT scan showed a 3.1 cm spiculated left upper lobe mass.  There was also additional subpleural bilateral pulmonary nodules as well as a 4 cm left-sided thyroid nodule.  PET scan demonstrated hypermetabolic left upper lobe mass consistent with malignancy.  No evidence of nodal or distant metastatic disease was noted no other suspicious metabolic activity in the chest was identified.  There was hypermetabolic activity in the thyroid nodule.  She underwent bronchoscopy which was positive for squamous cell carcinoma.  She is discussed treatment with medical oncology is now referred to radiation collagen for consideration of treatment.  Again she has a mild nonproductive cough no hemoptysis no significant shortness of breath at this point in time.  PLANNED TREATMENT REGIMEN: SBRT  PAST MEDICAL HISTORY:  has a past medical history of Allergy, Anemia, Anginal pain ([place]), Breast cancer ([place]), C1 cervical fracture ([place]), CA - cancer of ovary, Cancer ([place]) (02/2008), Cataract, Complication of anesthesia, COPD (chronic obstructive pulmonary disease) ([place]), DDD (degenerative disc disease), Depression, Diverticulosis, Dyspnea, Eczema, Family history of colon cancer, GERD (gastroesophageal reflux  disease), Headache, Hypertension, Mass of left lung, Osteopenia, Rosacea, and Zoster (02/2007).    PAST SURGICAL HISTORY:  Past Surgical History:  Procedure Laterality Date   ABDOMINAL HYSTERECTOMY  1996   BSO fibroids, incidental ovarian CA finding   BASAL CELL CARCINOMA EXCISION  09/2016   bladder tack     BREAST SURGERY     rt total mastectomy for breast CA   CHOLECYSTECTOMY  08/1996   10 yrs ago   COLONOSCOPY     lipoma removal  11/2005   NASAL SEPTUM SURGERY     POLYPECTOMY     VIDEO BRONCHOSCOPY WITH ENDOBRONCHIAL ULTRASOUND Left 06/16/2021   Procedure: POSSIBLE VIDEO BRONCHOSCOPY WITH ENDOBRONCHIAL ULTRASOUND;  Surgeon: Tyler Pita, MD;  Location: [facility] ORS;  Service: Pulmonary;  Laterality: Left;    FAMILY HISTORY: family history includes Cancer in her maternal aunt, paternal aunt, paternal uncle, and sister; Colon cancer in her sister; Colon cancer (age of onset: 48) in her daughter; Diabetes in her sister; Head & neck cancer (age of onset: 35) in her sister; Heart disease in her brother and mother; Heart disease (age of onset: 44) in her sister; Hypertension in her brother and mother; Lung cancer in her sister; Lung cancer (age of onset: 32) in her father; Melanoma in her sister; Stomach cancer (age of onset: 63) in her mother.  SOCIAL HISTORY:  reports that she quit smoking about 31 years ago. Her smoking use included cigarettes. She has a 20.00 pack-year smoking history. She has never used smokeless tobacco. She reports current alcohol use of about 7.0 standard drinks of alcohol per week. She reports that she does not  use drugs.  ALLERGIES: Sulfa antibiotics  MEDICATIONS:  Current Outpatient Medications  Medication Sig Dispense Refill   amLODipine (NORVASC) 10 MG tablet TAKE 1 TABLET BY MOUTH EVERY DAY (Patient taking differently: Take 10 mg by mouth at bedtime.) 90 tablet 1   Calcium Carbonate-Vitamin D (CALTRATE 600+D PO) Take 1,200 mg by mouth every evening.       Cyanocobalamin (B-12 PO) Take 5,000 mg by mouth once a week. (B-12)     Doxylamine Succinate, Sleep, (UNISOM PO) Take 50 mg by mouth at bedtime.     Ferrous Sulfate (IRON) 325 (65 Fe) MG TABS Take 1 tablet by mouth daily.     FLUoxetine (PROZAC) 20 MG capsule TAKE 2 CAPSULES BY MOUTH EVERY DAY (Patient taking differently: Take 40 mg by mouth every morning.) 180 capsule 1   Fluticasone-Umeclidin-Vilant (TRELEGY ELLIPTA) 100-62.5-25 MCG/ACT AEPB Inhale 1 puff into the lungs daily. (Patient taking differently: Inhale 1 puff into the lungs every morning.) 28 each 0   losartan (COZAAR) 50 MG tablet TAKE 1 TABLET BY MOUTH EVERY DAY (Patient taking differently: Take 50 mg by mouth every morning.) 90 tablet 1   MAGNESIUM PO Take 500 mg by mouth daily.     MELATONIN PO Take 10 mg by mouth at bedtime.     Multiple Vitamin (MULTIVITAMIN) tablet Take 1 tablet by mouth daily.     Multiple Vitamins-Minerals (PRESERVISION AREDS 2 PO) Take 1 capsule by mouth 2 (two) times daily.     Secukinumab, 300 MG Dose, (COSENTYX, 300 MG DOSE,) 150 MG/ML SOSY Inject 1 application as directed every 30 (thirty) days.     No current facility-administered medications for this encounter.    ECOG PERFORMANCE STATUS:  0 - Asymptomatic  REVIEW OF SYSTEMS: Patient has history of ovarian cancer as well as breast cancer.  She also has COPD degenerative disc disease. Patient denies any weight loss, fatigue, weakness, fever, chills or night sweats. Patient denies any loss of vision, blurred vision. Patient denies any ringing  of the ears or hearing loss. No irregular heartbeat. Patient denies heart murmur or history of fainting. Patient denies any chest pain or pain radiating to her upper extremities. Patient denies any shortness of breath, difficulty breathing at night, cough or hemoptysis. Patient denies any swelling in the lower legs. Patient denies any nausea vomiting, vomiting of blood, or coffee ground material in the vomitus.  Patient denies any stomach pain. Patient states has had normal bowel movements no significant constipation or diarrhea. Patient denies any dysuria, hematuria or significant nocturia. Patient denies any problems walking, swelling in the joints or loss of balance. Patient denies any skin changes, loss of hair or loss of weight. Patient denies any excessive worrying or anxiety or significant depression. Patient denies any problems with insomnia. Patient denies excessive thirst, polyuria, polydipsia. Patient denies any swollen glands, patient denies easy bruising or easy bleeding. Patient denies any recent infections, allergies or URI. Patient "s visual fields have not changed significantly in recent time.   PHYSICAL EXAM: BP 131/68 (BP Location: Left Arm, Patient Position: Sitting, Cuff Size: Normal)   Pulse (!) 57   Temp (!) 97.5 F (36.4 C) (Tympanic)   Resp 20   Wt 133 lb 4.8 oz (60.5 kg)   BMI 26.03 kg/m  Well-developed well-nourished patient in NAD. HEENT reveals PERLA, EOMI, discs not visualized.  Oral cavity is clear. No oral mucosal lesions are identified. Neck is clear without evidence of cervical or supraclavicular adenopathy. Lungs are clear to  A&P. Cardiac examination is essentially unremarkable with regular rate and rhythm without murmur rub or thrill. Abdomen is benign with no organomegaly or masses noted. Motor sensory and DTR levels are equal and symmetric in the upper and lower extremities. Cranial nerves II through XII are grossly intact. Proprioception is intact. No peripheral adenopathy or edema is identified. No motor or sensory levels are noted. Crude visual fields are within normal range.  LABORATORY DATA: Pathology reports reviewed    RADIOLOGY RESULTS: CT scans and PET CT scans reviewed compatible with above-stated findings   IMPRESSION: Stage Ib squamous cell carcinoma of the left upper lobe in otherwise healthy 81 year old female  PLAN: This time I have recommended SBRT.   We will plan on delivering 60 Gray in 5 fractions.  Would use motion restriction as well as for dimensional treatment planning.  Risks and benefits of treatment including low side effect profile possible but development of cough down the road and some fatigue.  Patient comprehends recommendations well.  I have personally set up and ordered CT simulation.  I would like to take this opportunity to thank you for allowing me to participate in the care of your patient.[name], MD

## 2021-07-05 NOTE — Telephone Encounter (Signed)
Revealed negative genetic testing.   This normal result is reassuring and indicates that it is unlikely Pamela Morales's cancer is due to a hereditary cause.  It is unlikely that there is an increased risk of another cancer due to a mutation in one of these genes.  However, genetic testing is not perfect, and cannot definitively rule out a hereditary cause.  It will be important for her to keep in contact with genetics to learn if any additional testing may be needed in the future.

## 2021-07-07 ENCOUNTER — Encounter: Payer: Self-pay | Admitting: Licensed Clinical Social Worker

## 2021-07-07 ENCOUNTER — Ambulatory Visit: Payer: Self-pay | Admitting: Licensed Clinical Social Worker

## 2021-07-07 DIAGNOSIS — Z1379 Encounter for other screening for genetic and chromosomal anomalies: Secondary | ICD-10-CM

## 2021-07-07 NOTE — Progress Notes (Signed)
HPI:  Pamela Morales was previously seen in the [place] clinic due to a personal and family history of cancer and concerns regarding a hereditary predisposition to cancer. Please refer to our prior cancer genetics clinic note for more information regarding our discussion, assessment and recommendations, at the time. Pamela Morales recent genetic test results were disclosed to her, as were recommendations warranted by these results. These results and recommendations are discussed in more detail below.  At the age of 81, Pamela Morales was diagnosed with ovarian cancer incidentally after having a total hysterectomy. In 2010, at the age of 76, Pamela Morales was diagnosed with breast cancer. This was treated with mastectomy. She had genetic testing that year (result available for review) for BRCA1/2 that was negative. In the last few years, Pamela Morales had a melanoma removed.  In 2023, a left upper lobe lung mass was identified.  CANCER HISTORY:  Oncology History  Squamous cell carcinoma of left lung ([place])  06/18/2021 Initial Diagnosis   Squamous cell carcinoma of left lung ([place])   06/23/2021 Cancer Staging   Staging form: Lung, AJCC 8th Edition - Clinical stage from 06/23/2021: Stage IB (cT2a, cN0, cM0) - Signed by Lloyd Huger, MD on 06/23/2021     FAMILY HISTORY:  We obtained a detailed, 4-generation family history.  Significant diagnoses are listed below: Family History  Problem Relation Age of Onset   Hypertension Mother    Heart disease Mother        CAD   Stomach cancer Mother 13   Lung cancer Father 11   Cancer Sister        colon, skin   Heart disease Sister 82       MI   Diabetes Sister    Colon cancer Sister    Head & neck cancer Sister 72   Lung cancer Sister    Melanoma Sister        multiple on head   Heart disease Brother        HTN and MI   Hypertension Brother    Cancer Maternal Aunt        unk type   Cancer Paternal Aunt        unk type   Cancer  Paternal Uncle        unk type   Colon cancer Daughter 64   Esophageal cancer Neg Hx    Colon polyps Neg Hx     Pamela Morales has 1 daughter, 29, who had colon cancer at 11. Pamela Morales has 1 son as well who lives in [place], [place]. Pamela Morales had 4 sisters and 2 brothers. One sister had neck cancer and died at 62. Another sister had colon cancer and passed in her 52s. Her living sister has lung cancer and history of multiple melanomas on her head/face.    Pamela Morales mother had stomach cancer and passed at 73 due to heart issues. Patient had 13 maternal aunts/uncles. One aunt had cancer, unknown type.   Pamela Morales father had lung cancer and passed at 81. Patient had 6-7 paternal aunts and uncles, 1 aunt and at least 1 uncle had cancer, unknown types.    Pamela Morales is aware of previous family history of genetic testing for hereditary cancer risks. There is no reported Ashkenazi Jewish ancestry. There is no known consanguinity.  GENETIC TEST RESULTS: Genetic testing reported out on 07/01/2021 through the [facility] Multi-Cancer+RNA cancer panel found no pathogenic mutations.   The Multi-Cancer Panel +  RNA offered by [facility] includes sequencing and/or deletion duplication testing of the following 84 genes: AIP, ALK, APC, ATM, AXIN2,BAP1,  BARD1, BLM, BMPR1A, BRCA1, BRCA2, BRIP1, CASR, CDC73, CDH1, CDK4, CDKN1B, CDKN1C, CDKN2A (p14ARF), CDKN2A (p16INK4a), CEBPA, CHEK2, CTNNA1, DICER1, DIS3L2, EGFR (c.2369C>T, p.Thr790Met variant only), EPCAM (Deletion/duplication testing only), FH, FLCN, GATA2, GPC3, GREM1 (Promoter region deletion/duplication testing only), HOXB13 (c.251G>A, p.Gly84Glu), HRAS, KIT, MAX, MEN1, MET, MITF (c.952G>A, p.Glu318Lys variant only), MLH1, MSH2, MSH3, MSH6, MUTYH, NBN, NF1, NF2, NTHL1, PALB2, PDGFRA, PHOX2B, PMS2, POLD1, POLE, POT1, PRKAR1A, PTCH1, PTEN, RAD50, RAD51C, RAD51D, RB1, RECQL4, RET, RUNX1, SDHAF2, SDHA (sequence changes only), SDHB, SDHC, SDHD, SMAD4, SMARCA4, SMARCB1,  SMARCE1, STK11, SUFU, TERC, TERT, TMEM127, TP53, TSC1, TSC2, VHL, WRN and WT1.   The test report has been scanned into EPIC and is located under the Molecular Pathology section of the Results Review tab.  A portion of the result report is included below for reference.     We discussed that because current genetic testing is not perfect, it is possible there may be a gene mutation in one of these genes that current testing cannot detect, but that chance is small.  There could be another gene that has not yet been discovered, or that we have not yet tested, that is responsible for the cancer diagnoses in the family. It is also possible there is a hereditary cause for the cancer in the family that Ms. Frey did not inherit and therefore was not identified in her testing.  Therefore, it is important to remain in touch with cancer genetics in the future so that we can continue to offer Ms. Gaugh the most up to date genetic testing.   ADDITIONAL GENETIC TESTING: We discussed with Pamela Morales that her genetic testing was fairly extensive.  If there are genes identified to increase cancer risk that can be analyzed in the future, we would be happy to discuss and coordinate this testing at that time.    CANCER SCREENING RECOMMENDATIONS: Pamela Morales test result is considered negative (normal).  This means that we have not identified a hereditary cause for her  personal and family history of cancer at this time. Most cancers happen by chance and this negative test suggests that her cancer may fall into this category.    While reassuring, this does not definitively rule out a hereditary predisposition to cancer. It is still possible that there could be genetic mutations that are undetectable by current technology. There could be genetic mutations in genes that have not been tested or identified to increase cancer risk.  Therefore, it is recommended she continue to follow the cancer management and screening  guidelines provided by her oncology and primary healthcare provider.   An individual's cancer risk and medical management are not determined by genetic test results alone. Overall cancer risk assessment incorporates additional factors, including personal medical history, family history, and any available genetic information that may result in a personalized plan for cancer prevention and surveillance.  RECOMMENDATIONS FOR FAMILY MEMBERS:  Relatives in this family might be at some increased risk of developing cancer, over the general population risk, simply due to the family history of cancer.  We recommended female relatives in this family have a yearly mammogram beginning at age 33, or 75 years younger than the earliest onset of cancer, an annual clinical breast exam, and perform monthly breast self-exams. Female relatives in this family should also have a gynecological exam as recommended by their primary provider.  All family members should  be referred for colonoscopy starting at age 25.   FOLLOW-UP: Lastly, we discussed with Pamela Morales that cancer genetics is a rapidly advancing field and it is possible that new genetic tests will be appropriate for her and/or her family members in the future. We encouraged her to remain in contact with cancer genetics on an annual basis so we can update her personal and family histories and let her know of advances in cancer genetics that may benefit this family.   Our contact number was provided. Pamela Morales questions were answered to her satisfaction, and she knows she is welcome to call us at anytime with additional questions or concerns.   Faith Rogue, MS, [facility] Genetic Counselor [email or web address] Phone: [phone]

## 2021-07-12 ENCOUNTER — Ambulatory Visit
Admission: RE | Admit: 2021-07-12 | Discharge: 2021-07-12 | Disposition: A | Discharge status: Home / Self Care | Payer: Medicare Other | Source: Ambulatory Visit | Attending: Radiation Oncology | Admitting: Radiation Oncology

## 2021-07-12 ENCOUNTER — Encounter: Payer: Self-pay | Admitting: *Deleted

## 2021-07-12 DIAGNOSIS — Z510 Encounter for antineoplastic radiation therapy: Secondary | ICD-10-CM | POA: Diagnosis not present

## 2021-07-12 DIAGNOSIS — C3412 Malignant neoplasm of upper lobe, left bronchus or lung: Secondary | ICD-10-CM | POA: Diagnosis not present

## 2021-07-12 DIAGNOSIS — Z87891 Personal history of nicotine dependence: Secondary | ICD-10-CM | POA: Diagnosis not present

## 2021-07-13 ENCOUNTER — Encounter: Payer: Self-pay | Admitting: Pulmonary Disease

## 2021-07-13 ENCOUNTER — Ambulatory Visit: Payer: Medicare Other | Admitting: Pulmonary Disease

## 2021-07-13 VITALS — BP 126/62 | HR 55 | Temp 97.7°F | Ht 60.0 in | Wt 132.2 lb

## 2021-07-13 DIAGNOSIS — N1832 Chronic kidney disease, stage 3b: Secondary | ICD-10-CM | POA: Diagnosis not present

## 2021-07-13 DIAGNOSIS — J449 Chronic obstructive pulmonary disease, unspecified: Secondary | ICD-10-CM | POA: Diagnosis not present

## 2021-07-13 DIAGNOSIS — C3492 Malignant neoplasm of unspecified part of left bronchus or lung: Secondary | ICD-10-CM

## 2021-07-13 MED ORDER — TRELEGY ELLIPTA 100-62.5-25 MCG/ACT IN AEPB
1.0000 | INHALATION_SPRAY | Freq: Every day | RESPIRATORY_TRACT | 0 refills | Status: DC
Start: 2021-07-13 — End: 2021-08-11

## 2021-07-13 MED ORDER — TRELEGY ELLIPTA 100-62.5-25 MCG/ACT IN AEPB: 1.0000 | INHALATION_SPRAY | Freq: Every day | RESPIRATORY_TRACT | 11 refills | Status: DC

## 2021-07-13 NOTE — Progress Notes (Signed)
Subjective:    Patient ID: Pamela Morales, female    DOB: [birth year], 81 y.o.   MRN: [id number] Patient Care Team: Abner Greenspan, MD as PCP - Patsi Sears, MD as Consulting Physician (Dermatology) Webb Laws, [place] as Consulting Physician (Optometry) Fanny Skates, MD as Consulting Physician (General Surgery) Debbora Dus, [facility] as Pharmacist (Pharmacist) Telford Nab, RN as Oncology Nurse Navigator [name], Kathlene November, MD as Consulting Physician (Oncology)   Chief Complaint  Patient presents with   Follow-up    Recent bx--SOB with exertion and throat clearing.    HPI Patient is an 81 year old former smoker who presents for follow-up after robotic assisted navigational bronchoscopy on 16 June 2021.  Recall she had a left upper lobe spiculated mass measuring 3.5 x 2.8 cm.  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  She subsequently saw Dr. [name] her oncologist who presented her with the choice of thoracic surgery referral for resection or SBRT.  The patient elected to go for SBRT.  She is to start her treatment in the next few days.  She has moderate COPD by history.  At her prior evaluation we had given her a trial of Trelegy Ellipta.  She finds that this is very helpful to her.  She has noted some hoarseness however, she has not been rinsing well after use.  We discussed the proper rinsing protocol after use of inhaler.  She understood the instructions.  Patient has had no fevers, chills or sweats.  No chest pain.  No weight loss or anorexia.  As of breath has been well controlled with Trelegy.  No wheezing.   Review of Systems A 10 point review of systems was performed and it is as noted above otherwise negative.  Patient Active Problem List   Diagnosis Date Noted   Genetic testing 07/07/2021   Squamous cell carcinoma of left lung ([place]) 06/18/2021   Personal history of ovarian cancer 06/15/2021   Family history of melanoma  06/15/2021   Family history of stomach cancer 06/15/2021   Mass of left lung 05/14/2021   Abnormal chest x-ray 05/11/2021   Left shoulder pain 04/28/2021   CRI (chronic renal insufficiency) 10/23/2020   Acute bronchitis 05/25/2020   Left foot pain 01/27/2020   High triglycerides 07/16/2019   Laceration of right knee 12/10/2018   COPD (chronic obstructive pulmonary disease) ([place]) 07/12/2018   Elevated TSH 10/18/2016   Elevated serum creatinine 10/18/2016   Estrogen deficiency 03/23/2016   Encounter for screening mammogram for breast cancer 03/23/2016   Psoriatic arthritis ([place]) 03/31/2015   Routine general medical examination at a health care facility 03/25/2015   Heartburn 03/03/2015   Fatigue 04/28/2014   Family history of colon cancer 01/01/2013   Personal history of colonic polyps 01/01/2013   Colon cancer screening 12/19/2012   Hearing loss 09/19/2012   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   Colon polyps 07/16/2010   BACK PAIN WITH RADICULOPATHY 03/15/2007   History of herpes zoster 02/23/2007   Essential hypertension 02/05/2007   Osteopenia 02/05/2007   ECZEMA, ATOPIC 01/29/2007   ROSACEA 01/29/2007   BASAL CELL CARCINOMA, HX OF 01/29/2007   Social History   Tobacco Use   Smoking status: Former    Packs/day: 2.00    Years: 10.00    Total pack years: 20.00    Types: Cigarettes    Quit date: 01/03/1990    Years since quitting: 31.5   Smokeless tobacco: Never  Substance Use Topics   Alcohol use: Yes    Alcohol/week: 7.0 standard drinks of alcohol    Types: 5 Glasses of wine, 2 Standard drinks or equivalent per week    Comment: occ glass of wine   Allergies  Allergen Reactions   Sulfa Antibiotics     Pt unsure of reaction   Current Meds  Medication Sig   amLODipine (NORVASC) 10 MG tablet TAKE 1 TABLET BY MOUTH EVERY DAY (Patient taking differently: Take 10 mg by mouth at bedtime.)   Calcium Carbonate-Vitamin D (CALTRATE 600+D PO) Take 1,200 mg by  mouth every evening.    Cyanocobalamin (B-12 PO) Take 5,000 mg by mouth once a week. (B-12)   Doxylamine Succinate, Sleep, (UNISOM PO) Take 50 mg by mouth at bedtime.   Ferrous Sulfate (IRON) 325 (65 Fe) MG TABS Take 1 tablet by mouth daily.   FLUoxetine (PROZAC) 20 MG capsule TAKE 2 CAPSULES BY MOUTH EVERY DAY (Patient taking differently: Take 40 mg by mouth every morning.)   Fluticasone-Umeclidin-Vilant (TRELEGY ELLIPTA) 100-62.5-25 MCG/ACT AEPB Inhale 1 puff into the lungs daily. (Patient taking differently: Inhale 1 puff into the lungs every morning.)   Fluticasone-Umeclidin-Vilant (TRELEGY ELLIPTA) 100-62.5-25 MCG/ACT AEPB Inhale 1 puff into the lungs daily.   Fluticasone-Umeclidin-Vilant (TRELEGY ELLIPTA) 100-62.5-25 MCG/ACT AEPB Inhale 1 puff into the lungs daily.   losartan (COZAAR) 50 MG tablet TAKE 1 TABLET BY MOUTH EVERY DAY (Patient taking differently: Take 50 mg by mouth every morning.)   MAGNESIUM PO Take 500 mg by mouth daily.   MELATONIN PO Take 10 mg by mouth at bedtime.   Multiple Vitamin (MULTIVITAMIN) tablet Take 1 tablet by mouth daily.   Multiple Vitamins-Minerals (PRESERVISION AREDS 2 PO) Take 1 capsule by mouth 2 (two) times daily.   Secukinumab, 300 MG Dose, (COSENTYX, 300 MG DOSE,) 150 MG/ML SOSY Inject 1 application as directed every 30 (thirty) days.   Immunization History  Administered Date(s) Administered   Fluad Quad(high Dose 65+) 01/01/2020   Influenza Split 10/11/2011   Influenza Whole 10/04/2006   Influenza, High Dose Seasonal PF 10/12/2016, 10/30/2017, 10/04/2018, 01/01/2020   Influenza,inj,Quad PF,6+ Mos 09/19/2012, 10/31/2013, 10/10/2014   Influenza-Unspecified 09/18/2015   Moderna Sars-Covid-2 Vaccination 01/29/2019, 02/26/2019, 11/23/2019   Pneumococcal Conjugate-13 04/28/2014   Pneumococcal Polysaccharide-23 08/03/2012   Td 01/03/1994, 07/17/2008, 12/10/2018   Zoster Recombinat (Shingrix) 03/25/2021       Objective:   Physical Exam BP 126/62  (BP Location: Left Arm, Cuff Size: Normal)   Pulse (!) 55   Temp 97.7 F (36.5 C) (Temporal)   Ht 5' (1.524 m)   Wt 132 lb 3.2 oz (60 kg)   SpO2 99%   BMI 25.82 kg/m  GENERAL: Well-developed well-nourished elderly woman, no acute distress.  Fully ambulatory, no conversational dyspnea.  Quite spry.  Mild hoarseness noted. HEAD: Normocephalic, atraumatic.  EYES: Pupils equal, round, reactive to light.  No scleral icterus.  MOUTH: Oral mucosa moist.  No thrush. NECK: Supple. No thyromegaly. Trachea midline. No JVD.  No adenopathy. PULMONARY: Good air entry bilaterally.  No adventitious sounds. CARDIOVASCULAR: S1 and S2. Regular rate and rhythm.  No rubs, murmurs or gallops heard. ABDOMEN: Benign. MUSCULOSKELETAL: No joint deformity, no clubbing, no edema.  NEUROLOGIC: No overt focal deficit, no gait disturbance, speech is fluent. SKIN: Intact,warm,dry. PSYCH: Mood and behavior normal.    Assessment & Plan:     ICD-10-CM   1. Stage 1 mild COPD by GOLD classification ([place])  J44.9    Patient was  provided with Trelegy samples Prescription for Trelegy was sent to her pharmacy Follow-up 4 months or as needed    2. Squamous cell carcinoma of left lung (HCC)  C34.92    To start SBRT No sequela post robotic assisted navigational bronchoscopy     Meds ordered this encounter  Medications   Fluticasone-Umeclidin-Vilant (TRELEGY ELLIPTA) 100-62.5-25 MCG/ACT AEPB    Sig: Inhale 1 puff into the lungs daily.    Dispense:  28 each    Refill:  11   Fluticasone-Umeclidin-Vilant (TRELEGY ELLIPTA) 100-62.5-25 MCG/ACT AEPB    Sig: Inhale 1 puff into the lungs daily.    Dispense:  14 each    Refill:  0    Order Specific Question:   Lot Number?    Answer:   [id number]    Order Specific Question:   Expiration Date?    Answer:   10/04/2022    Order Specific Question:   Manufacturer?    Answer:   [employer] [12]    Order Specific Question:   Quantity    Answer:   1   C. Derrill Kay,  MD Advanced Bronchoscopy PCCM [facility]    *This note was dictated using voice recognition software/Dragon.  Despite best efforts to proofread, errors can occur which can change the meaning. Any transcriptional errors that result from this process are unintentional and may not be fully corrected at the time of dictation.

## 2021-07-13 NOTE — Patient Instructions (Signed)
Your lungs sounded good today.  We have provided you with a sample of Trelegy.  A prescription was sent to your pharmacy.  If you have any difficulties getting the medication.  We will see you in follow-up in 4 months time call sooner should any new problems arise.

## 2021-07-14 DIAGNOSIS — C3412 Malignant neoplasm of upper lobe, left bronchus or lung: Secondary | ICD-10-CM | POA: Diagnosis not present

## 2021-07-14 DIAGNOSIS — Z87891 Personal history of nicotine dependence: Secondary | ICD-10-CM | POA: Diagnosis not present

## 2021-07-14 DIAGNOSIS — Z510 Encounter for antineoplastic radiation therapy: Secondary | ICD-10-CM | POA: Diagnosis not present

## 2021-07-20 ENCOUNTER — Encounter: Payer: Self-pay | Admitting: *Deleted

## 2021-07-20 ENCOUNTER — Other Ambulatory Visit: Payer: Self-pay

## 2021-07-20 ENCOUNTER — Ambulatory Visit
Admission: RE | Admit: 2021-07-20 | Discharge: 2021-07-20 | Disposition: A | Discharge status: Home / Self Care | Payer: Medicare Other | Source: Ambulatory Visit | Attending: Radiation Oncology | Admitting: Radiation Oncology

## 2021-07-20 DIAGNOSIS — Z510 Encounter for antineoplastic radiation therapy: Secondary | ICD-10-CM | POA: Diagnosis not present

## 2021-07-20 DIAGNOSIS — C3412 Malignant neoplasm of upper lobe, left bronchus or lung: Secondary | ICD-10-CM | POA: Diagnosis not present

## 2021-07-20 LAB — RAD ONC ARIA SESSION SUMMARY
Course Elapsed Days: 0
Plan Fractions Treated to Date: 1
Plan Prescribed Dose Per Fraction: 12 Gy
Plan Total Fractions Prescribed: 5
Plan Total Prescribed Dose: 60 Gy
Reference Point Dosage Given to Date: 12 Gy
Reference Point Session Dosage Given: 12 Gy
Session Number: 1

## 2021-07-21 ENCOUNTER — Ambulatory Visit: Payer: Medicare Other

## 2021-07-22 ENCOUNTER — Other Ambulatory Visit: Payer: Self-pay

## 2021-07-22 ENCOUNTER — Ambulatory Visit
Admission: RE | Admit: 2021-07-22 | Discharge: 2021-07-22 | Disposition: A | Discharge status: Home / Self Care | Payer: Medicare Other | Source: Ambulatory Visit | Attending: Radiation Oncology | Admitting: Radiation Oncology

## 2021-07-22 DIAGNOSIS — Z510 Encounter for antineoplastic radiation therapy: Secondary | ICD-10-CM | POA: Diagnosis not present

## 2021-07-22 DIAGNOSIS — C3412 Malignant neoplasm of upper lobe, left bronchus or lung: Secondary | ICD-10-CM | POA: Diagnosis not present

## 2021-07-22 LAB — RAD ONC ARIA SESSION SUMMARY
Course Elapsed Days: 2
Plan Fractions Treated to Date: 2
Plan Prescribed Dose Per Fraction: 12 Gy
Plan Total Fractions Prescribed: 5
Plan Total Prescribed Dose: 60 Gy
Reference Point Dosage Given to Date: 24 Gy
Reference Point Session Dosage Given: 12 Gy
Session Number: 2

## 2021-07-23 DIAGNOSIS — N1831 Chronic kidney disease, stage 3a: Secondary | ICD-10-CM | POA: Diagnosis not present

## 2021-07-23 DIAGNOSIS — D630 Anemia in neoplastic disease: Secondary | ICD-10-CM | POA: Diagnosis not present

## 2021-07-23 DIAGNOSIS — L4050 Arthropathic psoriasis, unspecified: Secondary | ICD-10-CM | POA: Diagnosis not present

## 2021-07-23 DIAGNOSIS — I129 Hypertensive chronic kidney disease with stage 1 through stage 4 chronic kidney disease, or unspecified chronic kidney disease: Secondary | ICD-10-CM | POA: Diagnosis not present

## 2021-07-23 DIAGNOSIS — C801 Malignant (primary) neoplasm, unspecified: Secondary | ICD-10-CM | POA: Diagnosis not present

## 2021-07-26 ENCOUNTER — Ambulatory Visit: Payer: Medicare Other

## 2021-07-27 ENCOUNTER — Other Ambulatory Visit: Payer: Self-pay

## 2021-07-27 ENCOUNTER — Ambulatory Visit
Admission: RE | Admit: 2021-07-27 | Discharge: 2021-07-27 | Disposition: A | Discharge status: Home / Self Care | Payer: Medicare Other | Source: Ambulatory Visit | Attending: Radiation Oncology | Admitting: Radiation Oncology

## 2021-07-27 DIAGNOSIS — C3412 Malignant neoplasm of upper lobe, left bronchus or lung: Secondary | ICD-10-CM | POA: Diagnosis not present

## 2021-07-27 DIAGNOSIS — Z510 Encounter for antineoplastic radiation therapy: Secondary | ICD-10-CM | POA: Diagnosis not present

## 2021-07-27 LAB — RAD ONC ARIA SESSION SUMMARY
Course Elapsed Days: 7
Plan Fractions Treated to Date: 3
Plan Prescribed Dose Per Fraction: 12 Gy
Plan Total Fractions Prescribed: 5
Plan Total Prescribed Dose: 60 Gy
Reference Point Dosage Given to Date: 36 Gy
Reference Point Session Dosage Given: 12 Gy
Session Number: 3

## 2021-07-28 ENCOUNTER — Ambulatory Visit: Payer: Medicare Other

## 2021-07-29 ENCOUNTER — Other Ambulatory Visit: Payer: Self-pay

## 2021-07-29 ENCOUNTER — Ambulatory Visit
Admission: RE | Admit: 2021-07-29 | Discharge: 2021-07-29 | Disposition: A | Discharge status: Home / Self Care | Payer: Medicare Other | Source: Ambulatory Visit | Attending: Radiation Oncology | Admitting: Radiation Oncology

## 2021-07-29 DIAGNOSIS — C3412 Malignant neoplasm of upper lobe, left bronchus or lung: Secondary | ICD-10-CM | POA: Diagnosis not present

## 2021-07-29 DIAGNOSIS — Z510 Encounter for antineoplastic radiation therapy: Secondary | ICD-10-CM | POA: Diagnosis not present

## 2021-07-29 LAB — RAD ONC ARIA SESSION SUMMARY
Course Elapsed Days: 9
Plan Fractions Treated to Date: 4
Plan Prescribed Dose Per Fraction: 12 Gy
Plan Total Fractions Prescribed: 5
Plan Total Prescribed Dose: 60 Gy
Reference Point Dosage Given to Date: 48 Gy
Reference Point Session Dosage Given: 12 Gy
Session Number: 4

## 2021-08-02 ENCOUNTER — Ambulatory Visit: Payer: Medicare Other

## 2021-08-03 ENCOUNTER — Ambulatory Visit
Admission: RE | Admit: 2021-08-03 | Discharge: 2021-08-03 | Disposition: A | Discharge status: Home / Self Care | Payer: Medicare Other | Source: Ambulatory Visit | Attending: Radiation Oncology | Admitting: Radiation Oncology

## 2021-08-03 ENCOUNTER — Other Ambulatory Visit: Payer: Self-pay

## 2021-08-03 DIAGNOSIS — C3412 Malignant neoplasm of upper lobe, left bronchus or lung: Secondary | ICD-10-CM | POA: Diagnosis not present

## 2021-08-03 DIAGNOSIS — Z510 Encounter for antineoplastic radiation therapy: Secondary | ICD-10-CM | POA: Insufficient documentation

## 2021-08-03 DIAGNOSIS — Z87891 Personal history of nicotine dependence: Secondary | ICD-10-CM | POA: Diagnosis not present

## 2021-08-03 LAB — RAD ONC ARIA SESSION SUMMARY
Course Elapsed Days: 14
Plan Fractions Treated to Date: 5
Plan Prescribed Dose Per Fraction: 12 Gy
Plan Total Fractions Prescribed: 5
Plan Total Prescribed Dose: 60 Gy
Reference Point Dosage Given to Date: 60 Gy
Reference Point Session Dosage Given: 12 Gy
Session Number: 5

## 2021-08-04 ENCOUNTER — Ambulatory Visit: Payer: Medicare Other

## 2021-08-05 NOTE — Progress Notes (Signed)
[place]  [phone] [phone]) [phone]  ID: Pamela Morales OB: 14-Nov-1940  MR#: [id number]  WNI#:[id number]  Patient Care Team: Abner Greenspan, MD as PCP - Patsi Sears, MD as Consulting Physician (Dermatology) Webb Laws, [place] as Consulting Physician (Optometry) Fanny Skates, MD as Consulting Physician (General Surgery) Debbora Dus, [facility] as Pharmacist (Pharmacist) Telford Nab, RN as Oncology Nurse Navigator [name], Kathlene November, MD as Consulting Physician (Oncology)  CHIEF COMPLAINT: 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  INTERVAL HISTORY: Patient returns to clinic today for further evaluation at the conclusion of her XRT.  She noticed increased weakness and fatigue during her treatments, but otherwise tolerated them well.  She has no neurologic complaints.  She denies any recent fevers or illnesses.  She has a good appetite and denies weight loss.  She has no chest pain, shortness of breath, cough, or hemoptysis.  She denies any nausea, vomiting, constipation, or diarrhea.  She has no urinary complaints.  Patient offers no further specific complaints today.  REVIEW OF SYSTEMS:   Review of Systems  Constitutional: Negative.  Negative for fever, malaise/fatigue and weight loss.  Respiratory: Negative.  Negative for cough and shortness of breath.   Cardiovascular: Negative.  Negative for chest pain and leg swelling.  Gastrointestinal: Negative.  Negative for abdominal pain.  Genitourinary: Negative.  Negative for dysuria.  Musculoskeletal: Negative.  Negative for back pain.  Skin: Negative.  Negative for rash.  Neurological: Negative.  Negative for dizziness, focal weakness, weakness and headaches.  Psychiatric/Behavioral: Negative.  The patient is not nervous/anxious.     As per HPI. Otherwise, a complete review of systems is negative.  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    Cancer ([place]) 02/2008   R breast  (est rec neg and brac neg)   Cataract    FORMING    Complication of anesthesia    deviated septum-lost memory for about 1 week after anesthesia   COPD (chronic obstructive pulmonary disease) ([place])    DDD (degenerative disc disease)    in back with chronic pain   Depression    Diverticulosis    Dyspnea    with exertion   Eczema    Family history of colon cancer    GERD (gastroesophageal reflux disease)    Headache    h/o migraines   Hypertension    Mass of left lung    Osteopenia    Rosacea    Zoster 02/2007    PAST SURGICAL HISTORY: Past Surgical History:  Procedure Laterality Date   ABDOMINAL HYSTERECTOMY  1996   BSO fibroids, incidental ovarian CA finding   BASAL CELL CARCINOMA EXCISION  09/2016   bladder tack     BREAST SURGERY     rt total mastectomy for breast CA   CHOLECYSTECTOMY  08/1996   10 yrs ago   COLONOSCOPY     lipoma removal  11/2005   NASAL SEPTUM SURGERY     POLYPECTOMY     VIDEO BRONCHOSCOPY WITH ENDOBRONCHIAL ULTRASOUND Left 06/16/2021   Procedure: POSSIBLE VIDEO BRONCHOSCOPY WITH ENDOBRONCHIAL ULTRASOUND;  Surgeon: Tyler Pita, MD;  Location: [facility] ORS;  Service: Pulmonary;  Laterality: Left;    FAMILY HISTORY: Family History  Problem Relation Age of Onset   Hypertension Mother    Heart disease Mother        CAD   Stomach cancer Mother 47   Lung cancer Father 15   Cancer Sister        colon, skin   Heart disease Sister 16       MI   Diabetes Sister    Colon cancer Sister    Head & neck cancer Sister 50   Lung cancer Sister    Melanoma Sister        multiple on head   Heart disease Brother        HTN and MI   Hypertension Brother    Cancer Maternal Aunt        unk type   Cancer Paternal Aunt         unk type   Cancer Paternal Uncle        unk type   Colon cancer Daughter 25   Esophageal cancer Neg Hx    Colon polyps Neg Hx     ADVANCED DIRECTIVES (Y/N):  N  HEALTH MAINTENANCE: Social History   Tobacco Use   Smoking status: Former    Packs/day: 2.00    Years: 10.00    Total pack years: 20.00    Types: Cigarettes    Quit date: 01/03/1990    Years since quitting: 31.6   Smokeless tobacco: Never  Vaping Use   Vaping Use: Never used  Substance Use Topics   Alcohol use: Yes    Alcohol/week: 7.0 standard drinks of alcohol    Types: 5 Glasses of wine, 2 Standard drinks or equivalent per week    Comment: occ glass of wine   Drug use: No     Colonoscopy:  PAP:  Bone density:  Lipid panel:  Allergies  Allergen Reactions   Sulfa Antibiotics     Pt unsure of reaction    Current Outpatient Medications  Medication Sig Dispense Refill   amLODipine (NORVASC) 10 MG tablet TAKE 1 TABLET BY MOUTH EVERY DAY (Patient taking differently: Take 10 mg by mouth at bedtime.) 90 tablet 1   Calcium Carbonate-Vitamin D (CALTRATE 600+D PO) Take 1,200 mg by mouth every evening.      Cyanocobalamin (B-12 PO) Take 5,000 mg by mouth once a week. (B-12)     Doxylamine Succinate, Sleep, (UNISOM PO) Take 50 mg by mouth at bedtime.     Ferrous Sulfate (IRON) 325 (65 Fe) MG TABS Take 1 tablet by mouth daily.     FLUoxetine (PROZAC) 20 MG capsule TAKE 2 CAPSULES BY MOUTH EVERY DAY (Patient taking differently: Take 40 mg by mouth every morning.) 180 capsule 1   Fluticasone-Umeclidin-Vilant (TRELEGY ELLIPTA) 100-62.5-25 MCG/ACT AEPB Inhale 1 puff into the lungs daily. (Patient taking differently: Inhale 1 puff into the lungs every morning.) 28 each 0   Fluticasone-Umeclidin-Vilant (TRELEGY ELLIPTA) 100-62.5-25 MCG/ACT AEPB Inhale 1 puff into the lungs daily. 28 each 11   losartan (COZAAR) 50 MG tablet TAKE 1 TABLET BY MOUTH EVERY DAY (Patient taking differently: Take 50 mg by mouth every morning.) 90  tablet 1   MAGNESIUM PO Take 500 mg by mouth daily.     MELATONIN PO Take 10 mg by mouth at bedtime.     Multiple Vitamin (MULTIVITAMIN) tablet Take 1 tablet by mouth daily.     Multiple Vitamins-Minerals (PRESERVISION AREDS 2 PO) Take 1 capsule by mouth  2 (two) times daily.     Secukinumab, 300 MG Dose, (COSENTYX, 300 MG DOSE,) 150 MG/ML SOSY Inject 1 application as directed every 30 (thirty) days.     No current facility-administered medications for this visit.    OBJECTIVE: Vitals:   08/11/21 1053  BP: 132/63  Pulse: (!) 58  Resp: 16  SpO2: 97%     Body mass index is 25.92 kg/m.    ECOG FS:0 - Asymptomatic  General: Well-developed, well-nourished, no acute distress. Eyes: Pink conjunctiva, anicteric sclera. HEENT: Normocephalic, moist mucous membranes. Lungs: No audible wheezing or coughing. Heart: Regular rate and rhythm. Abdomen: Soft, nontender, no obvious distention. Musculoskeletal: No edema, cyanosis, or clubbing. Neuro: Alert, answering all questions appropriately. Cranial nerves grossly intact. Skin: No rashes or petechiae noted. Psych: Normal affect.  LAB RESULTS:  Lab Results  Component Value Date   NA 137 05/20/2021   K 4.4 05/20/2021   CL 103 05/20/2021   CO2 27 05/20/2021   GLUCOSE 103 (H) 05/20/2021   BUN 24 (H) 05/20/2021   CREATININE 1.37 (H) 05/20/2021   CALCIUM 9.2 05/20/2021   PROT 7.8 05/20/2021   ALBUMIN 3.8 05/20/2021   AST 18 05/20/2021   ALT 15 05/20/2021   ALKPHOS 70 05/20/2021   BILITOT 0.4 05/20/2021   GFRNONAA 39 (L) 05/20/2021   GFRAA (L) 05/07/2008    56        The eGFR has been calculated using the MDRD equation. This calculation has not been validated in all clinical situations. eGFR's persistently <60 mL/min signify possible Chronic Kidney Disease.    Lab Results  Component Value Date   WBC 9.3 05/20/2021   NEUTROABS 6.2 05/20/2021   HGB 11.0 (L) 05/20/2021   HCT 33.9 (L) 05/20/2021   MCV 83.3 05/20/2021   PLT  300 05/20/2021     STUDIES: No results found.  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  Biopsy confirmed the results.  We discussed the option of surgical resection as well as radiation treatment and patient elected to pursue XRT.  No chemotherapy was necessary.  Patient completed her XRT in August 2023.  No intervention is needed at this time.  Will get a PET scan in 3 months and follow-up 1 to 2 days later for further evaluation.  If PET scan negative, patient can be transitioned to imaging with CT scan every 6 months.   History of breast cancer: Greater than 15 years ago.  Patient states she underwent mastectomy, but did not receive any XRT, chemotherapy, or hormonal treatment. History of ovarian cancer: Patient reports this was an incidental on pathology after having a total hysterectomy. History of melanoma: Unclear depth or stage. Right lower lobe pulmonary nodule: PET scan as above. Thyroid nodule: PET scan as above.  Patient will likely need a thyroid ultrasound to further identify. Genetics: Referral has been placed to genetics and results are pending at time of dictation.   I spent a total of 20 minutes reviewing chart data, face-to-face evaluation with the patient, counseling and coordination of care as detailed above.  Patient expressed understanding and was in agreement with this plan. She also understands that She can call clinic at any time with any questions, concerns, or complaints.    Cancer Staging  Squamous cell carcinoma  of left lung [facility]) Staging form: Lung, AJCC 8th Edition - Clinical stage from 06/23/2021: Stage IB (cT2a, cN0, cM0) - Signed by Lloyd Huger, MD on 06/23/2021   Lloyd Huger, MD   08/12/2021 7:43 AM

## 2021-08-10 ENCOUNTER — Ambulatory Visit: Payer: Medicare Other | Admitting: Oncology

## 2021-08-10 DIAGNOSIS — L4050 Arthropathic psoriasis, unspecified: Secondary | ICD-10-CM | POA: Diagnosis not present

## 2021-08-10 DIAGNOSIS — Z6824 Body mass index (BMI) 24.0-24.9, adult: Secondary | ICD-10-CM | POA: Diagnosis not present

## 2021-08-10 DIAGNOSIS — Z79899 Other long term (current) drug therapy: Secondary | ICD-10-CM | POA: Diagnosis not present

## 2021-08-10 DIAGNOSIS — M1991 Primary osteoarthritis, unspecified site: Secondary | ICD-10-CM | POA: Diagnosis not present

## 2021-08-10 DIAGNOSIS — L409 Psoriasis, unspecified: Secondary | ICD-10-CM | POA: Diagnosis not present

## 2021-08-10 DIAGNOSIS — R5383 Other fatigue: Secondary | ICD-10-CM | POA: Diagnosis not present

## 2021-08-11 ENCOUNTER — Inpatient Hospital Stay: Payer: Medicare Other | Attending: Oncology | Admitting: Oncology

## 2021-08-11 ENCOUNTER — Encounter: Payer: Self-pay | Admitting: Oncology

## 2021-08-11 VITALS — BP 132/63 | HR 58 | Resp 16 | Wt 132.7 lb

## 2021-08-11 DIAGNOSIS — E041 Nontoxic single thyroid nodule: Secondary | ICD-10-CM | POA: Diagnosis not present

## 2021-08-11 DIAGNOSIS — Z85820 Personal history of malignant melanoma of skin: Secondary | ICD-10-CM | POA: Insufficient documentation

## 2021-08-11 DIAGNOSIS — C3492 Malignant neoplasm of unspecified part of left bronchus or lung: Secondary | ICD-10-CM

## 2021-08-11 DIAGNOSIS — Z853 Personal history of malignant neoplasm of breast: Secondary | ICD-10-CM | POA: Insufficient documentation

## 2021-08-11 DIAGNOSIS — Z85118 Personal history of other malignant neoplasm of bronchus and lung: Secondary | ICD-10-CM | POA: Insufficient documentation

## 2021-08-11 DIAGNOSIS — Z79899 Other long term (current) drug therapy: Secondary | ICD-10-CM | POA: Insufficient documentation

## 2021-08-11 DIAGNOSIS — Z8543 Personal history of malignant neoplasm of ovary: Secondary | ICD-10-CM | POA: Diagnosis not present

## 2021-08-11 DIAGNOSIS — Z08 Encounter for follow-up examination after completed treatment for malignant neoplasm: Secondary | ICD-10-CM | POA: Diagnosis not present

## 2021-08-11 DIAGNOSIS — M1991 Primary osteoarthritis, unspecified site: Secondary | ICD-10-CM | POA: Insufficient documentation

## 2021-08-11 NOTE — Progress Notes (Unsigned)
Pt states still experiencing weakness and cold all of the time.

## 2021-08-12 ENCOUNTER — Encounter (INDEPENDENT_AMBULATORY_CARE_PROVIDER_SITE_OTHER): Payer: Medicare Other | Admitting: Ophthalmology

## 2021-08-12 DIAGNOSIS — H34812 Central retinal vein occlusion, left eye, with macular edema: Secondary | ICD-10-CM

## 2021-08-12 DIAGNOSIS — H43813 Vitreous degeneration, bilateral: Secondary | ICD-10-CM | POA: Diagnosis not present

## 2021-08-12 DIAGNOSIS — H353112 Nonexudative age-related macular degeneration, right eye, intermediate dry stage: Secondary | ICD-10-CM

## 2021-08-12 DIAGNOSIS — H35033 Hypertensive retinopathy, bilateral: Secondary | ICD-10-CM

## 2021-08-12 DIAGNOSIS — I10 Essential (primary) hypertension: Secondary | ICD-10-CM

## 2021-08-14 ENCOUNTER — Other Ambulatory Visit: Payer: Self-pay | Admitting: Family Medicine

## 2021-08-16 NOTE — Telephone Encounter (Signed)
Last filled 05/16/21 Last ov 05/11/21

## 2021-08-18 ENCOUNTER — Telehealth: Payer: Self-pay

## 2021-08-18 DIAGNOSIS — L821 Other seborrheic keratosis: Secondary | ICD-10-CM | POA: Diagnosis not present

## 2021-08-18 DIAGNOSIS — D2272 Melanocytic nevi of left lower limb, including hip: Secondary | ICD-10-CM | POA: Diagnosis not present

## 2021-08-18 DIAGNOSIS — L578 Other skin changes due to chronic exposure to nonionizing radiation: Secondary | ICD-10-CM | POA: Diagnosis not present

## 2021-08-18 DIAGNOSIS — Z85820 Personal history of malignant melanoma of skin: Secondary | ICD-10-CM | POA: Diagnosis not present

## 2021-08-18 DIAGNOSIS — D2261 Melanocytic nevi of right upper limb, including shoulder: Secondary | ICD-10-CM | POA: Diagnosis not present

## 2021-08-18 DIAGNOSIS — D225 Melanocytic nevi of trunk: Secondary | ICD-10-CM | POA: Diagnosis not present

## 2021-08-18 DIAGNOSIS — D2262 Melanocytic nevi of left upper limb, including shoulder: Secondary | ICD-10-CM | POA: Diagnosis not present

## 2021-08-18 DIAGNOSIS — D485 Neoplasm of uncertain behavior of skin: Secondary | ICD-10-CM | POA: Diagnosis not present

## 2021-08-18 DIAGNOSIS — B488 Other specified mycoses: Secondary | ICD-10-CM | POA: Diagnosis not present

## 2021-08-18 DIAGNOSIS — L570 Actinic keratosis: Secondary | ICD-10-CM | POA: Diagnosis not present

## 2021-08-18 NOTE — Chronic Care Management (AMB) (Signed)
    Chronic Care Management Pharmacy Assistant   Name: GERALDYN SHAIN  MRN: [id number] DOB: [birth year]  Reason for Encounter: Reminder Call   Conditions to be addressed/monitored: HTN and COPD   Recent office visits:  05/11/21-Marne Tower,MD(PCP)- acute bronchitis,CT-chest referral, start Augmentin 875-125mg  take 2 times daily,rest, fluids  04/28/21-Marne Tower,MD(PCP)-left shoulder pain, cough,xrays,use ice,start prednisone taper 40mg ,tessalon and update in 1 week  Recent consult visits:  08/11/21-Timothy Finnefan,MD(onco)-f/u Lung CA,.  We discussed the option of surgical resection as well as radiation treatment and patient elected to pursue XRT-referral for genetic testing. 08/10/21-Angela Hawks,MD(rheum)- no data found  07/13/21-Carmen Gonzalez,MD(pulmo)-given sample of trelegy-f/u 4 months  06/23/21-Timothy Finnegan,MD(onco)-f/u lung CA,-no changes 06/11/21-Chester Yarborough,MD([facility])- f/u cervical fracture-reviewed imaging,discontinue c collar,f/u as needed 06/04/21-Carmen Gonzales,MD(pulmo)-f/u lung mass-reviewed imaging,schedule bronchoscopy.Samples of trelegy given 05/27/21-Timothy Finnegan,MD(onco)-imaging results, referral for biopsy. 05/20/21-Timoghy Finnegan,MD(onco)-Initial Visit left lung mass.referral for scans.  Hospital visits:  None in previous 6 months  06/16/21-Carmen Gonzales,MD([facility])- Bronchoscopy-no admission  06/06/21-Melanie Belfi,MD( [facility])-fall, laceration,placed in cervical collar-no admission  Medications: Outpatient Encounter Medications as of 08/18/2021  Medication Sig   amLODipine (NORVASC) 10 MG tablet TAKE 1 TABLET BY MOUTH EVERY DAY   Calcium Carbonate-Vitamin D (CALTRATE 600+D PO) Take 1,200 mg by mouth every evening.    Cyanocobalamin (B-12 PO) Take 5,000 mg by mouth once a week. (B-12)   Doxylamine Succinate, Sleep, (UNISOM PO) Take 50 mg by mouth at bedtime.   Ferrous Sulfate (IRON) 325 (65 Fe) MG TABS Take 1 tablet by mouth daily.    FLUoxetine (PROZAC) 20 MG capsule TAKE 2 CAPSULES BY MOUTH EVERY DAY   Fluticasone-Umeclidin-Vilant (TRELEGY ELLIPTA) 100-62.5-25 MCG/ACT AEPB Inhale 1 puff into the lungs daily. (Patient taking differently: Inhale 1 puff into the lungs every morning.)   Fluticasone-Umeclidin-Vilant (TRELEGY ELLIPTA) 100-62.5-25 MCG/ACT AEPB Inhale 1 puff into the lungs daily.   losartan (COZAAR) 50 MG tablet TAKE 1 TABLET BY MOUTH EVERY DAY (Patient taking differently: Take 50 mg by mouth every morning.)   MAGNESIUM PO Take 500 mg by mouth daily.   MELATONIN PO Take 10 mg by mouth at bedtime.   Multiple Vitamin (MULTIVITAMIN) tablet Take 1 tablet by mouth daily.   Multiple Vitamins-Minerals (PRESERVISION AREDS 2 PO) Take 1 capsule by mouth 2 (two) times daily.   Secukinumab, 300 MG Dose, (COSENTYX, 300 MG DOSE,) 150 MG/ML SOSY Inject 1 application as directed every 30 (thirty) days.   No facility-administered encounter medications on file as of 08/18/2021.   ELBONY MCCLIMANS was contacted to remind of upcoming telephone visit with Charlene Brooke on 08/23/21 at 8:45am. Patient was reminded to have any blood glucose and blood pressure readings available for review at appointment.   Message was left reminding patient of appointment.   CCM referral has been placed prior to visit?  No   Star Rating Drugs: Medication:  Last Fill: Day Supply Losartan 50mg  08/06/21  [place], CPP notified  Avel Sensor, [place]  [phone]

## 2021-08-23 ENCOUNTER — Ambulatory Visit: Payer: Medicare Other | Admitting: Pharmacist

## 2021-08-23 DIAGNOSIS — N1832 Chronic kidney disease, stage 3b: Secondary | ICD-10-CM

## 2021-08-23 DIAGNOSIS — I10 Essential (primary) hypertension: Secondary | ICD-10-CM

## 2021-08-23 DIAGNOSIS — J449 Chronic obstructive pulmonary disease, unspecified: Secondary | ICD-10-CM

## 2021-08-23 NOTE — Progress Notes (Unsigned)
Chronic Care Management Pharmacy Note  08/25/2021 Name:  Pamela Morales MRN:  [id number] DOB:  [birth year]  Summary: CCM F/U visit -Reviewed medications; pt affirms compliance and denies issues -Pt is due for DEXA scan - she reports she had DEXA scan 05/13/21 @ [facility], same day as mammogram. No results in chart.  Recommendations/Changes made from today's visit: -Coordinate with [facility] to fax DEXA results. Consider Prolia if needed.  Plan: -[place] will call [facility] for DEXA results -Transition CCM to Self Care: Patient achieved CCM goals and no longer needs to be contacted as frequently. The patient has been provided with contact information for the care management team and has been advised to call with any health related questions or concerns.      Subjective: Pamela Morales is an 81 y.o. year old female who is a primary patient of Tower, Wynelle Fanny, MD.  The CCM team was consulted for assistance with disease management and care coordination needs.    Engaged with patient by telephone for follow up visit in response to provider referral for pharmacy case management and/or care coordination services.   Consent to Services:  The patient was given information about Chronic Care Management services, agreed to services, and gave verbal consent prior to initiation of services.  Please see initial visit note for detailed documentation.   Patient Care Team: Tower, Wynelle Fanny, MD as PCP - Patsi Sears, MD as Consulting Physician (Dermatology) Webb Laws, [place] as Consulting Physician (Optometry) Fanny Skates, MD as Consulting Physician (General Surgery) Telford Nab, RN as Oncology Nurse Navigator [name], Kathlene November, MD as Consulting Physician (Oncology) Charlton Haws, [facility] as Pharmacist (Pharmacist)  Recent office visits: 05/11/21-Marne Tower,MD(PCP)- acute bronchitis,CT-chest referral, start Augmentin 875-125mg  take 2 times daily,rest, fluids  04/28/21-Marne  Tower,MD(PCP)-left shoulder pain, cough,xrays,use ice,start prednisone taper 40mg ,tessalon and update in 1 week  Recent consult visits: 08/11/21-Timothy Finnefan,MD(Oncology)-f/u Lung CA,.  We discussed the option of surgical resection as well as radiation treatment and patient elected to pursue XRT-referral for genetic testing. 08/10/21-Angela Hawks,MD(rheum)- psoriatic arthritis 07/13/21-Carmen Gonzalez,MD(pulmo)-given sample of trelegy-f/u 4 months  06/23/21-Timothy Finnegan,MD(onco)-f/u lung CA,-no changes 06/11/21-Chester Yarborough,MD([facility])- f/u cervical fracture-reviewed imaging,discontinue c collar,f/u as needed 06/04/21-Carmen Gonzales,MD(pulmo)-f/u lung mass-reviewed imaging,schedule bronchoscopy.Samples of trelegy given 05/27/21-Timothy Finnegan,MD(onco)-imaging results, referral for biopsy. 05/20/21-Timoghy Finnegan,MD(onco)-Initial Visit left lung mass.referral for scans.  Hospital visits: None in previous 6 months   Objective:  Lab Results  Component Value Date   CREATININE 1.37 (H) 05/20/2021   BUN 24 (H) 05/20/2021   GFR 39.29 (L) 10/22/2020   GFRNONAA 39 (L) 05/20/2021   GFRAA (L) 05/07/2008    82        The eGFR has been calculated using the MDRD equation. This calculation has not been validated in all clinical situations. eGFR's persistently <60 mL/min signify possible Chronic Kidney Disease.   NA 137 05/20/2021   K 4.4 05/20/2021   CALCIUM 9.2 05/20/2021   CO2 27 05/20/2021   GLUCOSE 103 (H) 05/20/2021    Lab Results  Component Value Date/Time   GFR 39.29 (L) 10/22/2020 10:31 AM   GFR 40.12 (L) 07/14/2020 11:58 AM    Last diabetic Eye exam: No results found for: "HMDIABEYEEXA"  Last diabetic Foot exam: No results found for: "HMDIABFOOTEX"   Lab Results  Component Value Date   CHOL 197 07/16/2019   HDL 34.90 (L) 07/16/2019   LDLCALC 112 (H) 07/17/2008   LDLDIRECT 111.0 07/16/2019   TRIG 334.0 (H) 07/16/2019   CHOLHDL  6 07/16/2019       Latest  Ref Rng & Units 05/20/2021   12:16 PM 06/18/2020   11:09 AM 07/16/2019   12:51 PM  Hepatic Function  Total Protein 6.5 - 8.1 g/dL 7.8  6.9  6.7   Albumin 3.5 - 5.0 g/dL 3.8  4.1  4.1   AST 15 - 41 U/L $Remo'18  19  18   'uteIu$ ALT 0 - 44 U/L $Remo'15  12  11   'JJRwT$ Alk Phosphatase 38 - 126 U/L 70  65  61   Total Bilirubin 0.3 - 1.2 mg/dL 0.4  0.4  0.3     Lab Results  Component Value Date/Time   TSH 4.42 06/18/2020 11:09 AM   TSH 3.00 07/16/2019 12:51 PM   FREET4 0.76 06/18/2020 11:09 AM   FREET4 0.83 07/16/2019 12:51 PM       Latest Ref Rng & Units 05/20/2021   12:16 PM 06/18/2020   11:09 AM 07/16/2019   12:51 PM  CBC  WBC 4.0 - 10.5 K/uL 9.3  9.0  8.8   Hemoglobin 12.0 - 15.0 g/dL 11.0  11.8  12.2   Hematocrit 36.0 - 46.0 % 33.9  35.0  37.0   Platelets 150 - 400 K/uL 300  235.0  219.0     Lab Results  Component Value Date/Time   VD25OH 51 08/03/2012 12:01 PM   VD25OH 31 07/16/2010 11:54 AM    Clinical ASCVD: No  The ASCVD Risk score (Arnett DK, et al., 2019) failed to calculate for the following reasons:   The 2019 ASCVD risk score is only valid for ages 4 to 60       09/19/2020   11:01 AM 07/16/2019   12:44 PM 10/12/2016    3:14 PM  Depression screen PHQ 2/9  Decreased Interest 0 0 0  Down, Depressed, Hopeless 0 0 0  PHQ - 2 Score 0 0 0  Altered sleeping  0 0  Tired, decreased energy  1 0  Change in appetite  0 0  Feeling bad or failure about yourself   0 0  Trouble concentrating  0 0  Moving slowly or fidgety/restless  0 0  Suicidal thoughts  0 0  PHQ-9 Score  1 0  Difficult doing work/chores  Not difficult at all Not difficult at all     Social History   Tobacco Use  Smoking Status Former   Packs/day: 2.00   Years: 10.00   Total pack years: 20.00   Types: Cigarettes   Quit date: 01/03/1990   Years since quitting: 31.6  Smokeless Tobacco Never   BP Readings from Last 3 Encounters:  08/11/21 132/63  07/13/21 126/62  07/01/21 131/68   Pulse Readings from Last 3  Encounters:  08/11/21 (!) 58  07/13/21 (!) 55  07/01/21 (!) 57   Wt Readings from Last 3 Encounters:  08/11/21 132 lb 11.2 oz (60.2 kg)  07/13/21 132 lb 3.2 oz (60 kg)  07/01/21 133 lb 4.8 oz (60.5 kg)   BMI Readings from Last 3 Encounters:  08/11/21 25.92 kg/m  07/13/21 25.82 kg/m  07/01/21 26.03 kg/m    Assessment/Interventions: Review of patient past medical history, allergies, medications, health status, including review of consultants reports, laboratory and other test data, was performed as part of comprehensive evaluation and provision of chronic care management services.   SDOH:  (Social Determinants of Health) assessments and interventions performed: No - done 09/2020  SDOH Screenings   Alcohol Screen: Low Risk  (  09/19/2020)   Alcohol Screen    Last Alcohol Screening Score (AUDIT): 2  Depression (PHQ2-9): Low Risk  (09/19/2020)   Depression (PHQ2-9)    PHQ-2 Score: 0  Financial Resource Strain: Low Risk  (09/19/2020)   Overall Financial Resource Strain (CARDIA)    Difficulty of Paying Living Expenses: Not hard at all  Food Insecurity: No Food Insecurity (09/19/2020)   Hunger Vital Sign    Worried About Running Out of Food in the Last Year: Never true    Ran Out of Food in the Last Year: Never true  Housing: Low Risk  (09/19/2020)   Housing    Last Housing Risk Score: 0  Physical Activity: Sufficiently Active (09/19/2020)   Exercise Vital Sign    Days of Exercise per Week: 4 days    Minutes of Exercise per Session: 90 min  Social Connections: Socially Integrated (09/19/2020)   Social Connection and Isolation Panel [NHANES]    Frequency of Communication with Friends and Family: More than three times a week    Frequency of Social Gatherings with Friends and Family: Three times a week    Attends Religious Services: More than 4 times per year    Active Member of Clubs or Organizations: Yes    Attends Archivist Meetings: More than 4 times per year    Marital  Status: Married  Stress: No Stress Concern Present (09/19/2020)   [place]    Feeling of Stress : Not at all  Tobacco Use: Medium Risk (08/11/2021)   Patient History    Smoking Tobacco Use: Former    Smokeless Tobacco Use: Never    Passive Exposure: Not on file  Transportation Needs: No Transportation Needs (09/19/2020)   PRAPARE - Hydrologist (Medical): No    Lack of Transportation (Non-Medical): No    CCM Care Plan  Allergies  Allergen Reactions   Sulfa Antibiotics     Pt unsure of reaction    Medications Reviewed Today     Reviewed by Charlton Haws, [facility] (Pharmacist) on 08/23/21 at [place] List Status: <None>   Medication Order Taking? Sig Documenting Provider Last Dose Status Informant  amLODipine (NORVASC) 10 MG tablet [id number] Yes TAKE 1 TABLET BY MOUTH EVERY DAY Tower, Wynelle Fanny, MD Taking Active   Calcium Carbonate-Vitamin D (CALTRATE 600+D PO) [id number] Yes Take 1,200 mg by mouth every evening.  [provider] Taking Active Self  Cyanocobalamin (B-12 PO) [id number] Yes Take 5,000 mg by mouth once a week. (B-12) [provider] Taking Active   Doxylamine Succinate, Sleep, (UNISOM PO) [id number] Yes Take 50 mg by mouth at bedtime. [provider] Taking Active   Ferrous Sulfate (IRON) 325 (65 Fe) MG TABS [id number] Yes Take 1 tablet by mouth daily. [provider] Taking Active   FLUoxetine (PROZAC) 20 MG capsule [id number] Yes TAKE 2 CAPSULES BY MOUTH EVERY DAY Tower, Wynelle Fanny, MD Taking Active   Fluticasone-Umeclidin-Vilant (TRELEGY ELLIPTA) 100-62.5-25 MCG/ACT AEPB [id number] Yes Inhale 1 puff into the lungs daily. Tyler Pita, MD Taking Active   losartan (COZAAR) 50 MG tablet [id number] Yes TAKE 1 TABLET BY MOUTH EVERY DAY  Patient taking differently: Take 50 mg by mouth every morning.   Tower, Wynelle Fanny, MD Taking Active   MAGNESIUM PO  [id number] Yes Take 500 mg by mouth daily. [provider] Taking Active Self  MELATONIN PO [id number] Yes Take 10 mg by  mouth at bedtime. [provider] Taking Active Self  Multiple Vitamin (MULTIVITAMIN) tablet [id number] Yes Take 1 tablet by mouth daily. [provider] Taking Active Self  Multiple Vitamins-Minerals (PRESERVISION AREDS 2 PO) [id number] Yes Take 1 capsule by mouth 2 (two) times daily. [provider] Taking Active   Secukinumab, 300 MG Dose, (COSENTYX, 300 MG DOSE,) 150 MG/ML SOSY [id number] Yes Inject 1 application as directed every 30 (thirty) days. [provider] Taking Active             Patient Active Problem List   Diagnosis Date Noted   Primary localized osteoarthrosis of multiple sites 08/11/2021   Other long term (current) drug therapy 08/11/2021   Genetic testing 07/07/2021   Squamous cell carcinoma of left lung ([place]) 06/18/2021   Personal history of ovarian cancer 06/15/2021   Family history of melanoma 06/15/2021   Family history of stomach cancer 06/15/2021   Mass of left lung 05/14/2021   Abnormal chest x-ray 05/11/2021   Left shoulder pain 04/28/2021   CRI (chronic renal insufficiency) 10/23/2020   Acute bronchitis 05/25/2020   Left foot pain 01/27/2020   High triglycerides 07/16/2019   Laceration of right knee 12/10/2018   COPD (chronic obstructive pulmonary disease) ([place]) 07/12/2018   Elevated TSH 10/18/2016   Elevated serum creatinine 10/18/2016   Estrogen deficiency 03/23/2016   Encounter for screening mammogram for breast cancer 03/23/2016   Psoriatic arthritis ([place]) 03/31/2015   Routine general medical examination at a health care facility 03/25/2015   Heartburn 03/03/2015   Fatigue 04/28/2014   Family history of colon cancer 01/01/2013   Personal history of colonic polyps 01/01/2013   Colon cancer screening 12/19/2012   Hearing loss 09/19/2012   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    Colon polyps 07/16/2010   BACK PAIN WITH RADICULOPATHY 03/15/2007   History of herpes zoster 02/23/2007   Essential hypertension 02/05/2007   Osteopenia 02/05/2007   ECZEMA, ATOPIC 01/29/2007   ROSACEA 01/29/2007   BASAL CELL CARCINOMA, HX OF 01/29/2007    Immunization History  Administered Date(s) Administered   Fluad Quad(high Dose 65+) 01/01/2020   Influenza Split 10/11/2011   Influenza Whole 10/04/2006   Influenza, High Dose Seasonal PF 10/12/2016, 10/30/2017, 10/04/2018, 01/01/2020   Influenza,inj,Quad PF,6+ Mos 09/19/2012, 10/31/2013, 10/10/2014   Influenza-Unspecified 09/18/2015   Moderna Sars-Covid-2 Vaccination 01/29/2019, 02/26/2019, 11/23/2019   Pneumococcal Conjugate-13 04/28/2014   Pneumococcal Polysaccharide-23 08/03/2012   Td 01/03/1994, 07/17/2008, 12/10/2018   Zoster Recombinat (Shingrix) 03/25/2021    Conditions to be addressed/monitored:  Hypertension, COPD, Chronic Kidney Disease, Osteopenia, and Mood disorder  Care Plan : CCM Pharmacy Care Plan  Updates made by Charlton Haws, [place] since 08/25/2021 12:00 AM     Problem: Hypertension, COPD, Chronic Kidney Disease, Osteopenia, and Mood disorder   Priority: High     Long-Range Goal: Disease mgmt   Start Date: 08/25/2021  Expected End Date: 08/25/2021  This Visit's Progress: On track  Priority: High  Note:   Current Barriers:  DEXA scan results missing  Pharmacist Clinical Goal(s):  Patient will contact provider office for questions/concerns as evidenced notation of same in electronic health record through collaboration with PharmD and provider.   Interventions: 1:1 collaboration with Tower, Wynelle Fanny, MD regarding development and update of comprehensive plan of care as evidenced by provider attestation and co-signature Inter-disciplinary care team collaboration (see longitudinal plan of care) Comprehensive medication review performed; medication list updated in electronic medical  record  Hypertension  (BP  goal <140/90) -Controlled - per clinic readings -Denies hypotensive/hypertensive symptoms -Current treatment: Amlodipine 10 mg daily PM - Appropriate, Effective, Safe, Accessible Losartan 50 mg daily AM -Appropriate, Effective, Safe, Accessible -Medications previously tried: none reported  -Current home readings: none recent, BP monitor broke -Current dietary habits: drinking a lot of water daily -Current exercise habits: very active -Educated on BP goals and benefits of medications for prevention of heart attack, stroke and kidney damage; -Recommended to continue current medication  COPD (Goal: control symptoms and prevent exacerbations) -Controlled - per patient report -[place] cancer - Stage 1b SCC Dx 05/2021. Undergoing XRT. -Exacerbations requiring treatment in last 6 months: 0 -Current treatment  Trelegy 100-62.5-25 mcg/act 1 puff daily -Appropriate, Effective, Safe, Accessible Albuterol HFA PRN -Appropriate, Effective, Safe, Accessible -Medications previously tried: Symbicort -Patient reprots consistent use of maintenance inhaler -Frequency of rescue inhaler use: infrequent  -Counseled on Benefits of consistent maintenance inhaler use -Recommended to continue current medication  Mood Disorder (no dx in chart) (Goal: Improve mood) -Controlled - per patient report -Current treatment: Fluoxetine 20 mg daily -Appropriate, Effective, Safe, Accessible Unisom 50 mg daily HS -Appropriate, Effective, Safe, Accessible Melatonin 10 mg daily HS -Appropriate, Effective, Safe, Accessible -Medications previously tried/failed: none reported -PHQ9: 1 (07/16/19) -GAD7: none -She does not sleep well without the melatonin and Unisom nightly. She denies any adverse effects. -Recommended to continue current medication  Osteopenia (Goal prevent fractures) -Query controlled - pt reports she had DEXA scan 05/13/21 (same day as mammogram), results are not in chart -Last  DEXA Scan: 09/2018   T-Score femoral neck: -2.3  T-Score total hip: -2.3  T-Score lumbar spine: -2.0  10-year probability of major osteoporotic fracture: 32%  10-year probability of hip fracture: 21% -Current treatment  Calcium-Vitamin D -Appropriate, Effective, Safe, Accessible -Medications previously tried: risedronate x 5 years (stopped 2012) -Recommend weight-bearing and muscle strengthening exercises for building and maintaining bone density. -Will coordinate with [facility] for DEXA results; consider Prolia  Chronic Kidney Disease Stage 3b  -All medications assessed for renal dosing and appropriateness in chronic kidney disease. -Recommended to continue current medication  Health Maintenance -Vaccine gaps: Shingrix #2 today -Hx psoriatic arthritis, on Cosentyx. Follows with rheumatology  Patient Goals/Self-Care Activities Patient will:  - take medications as prescribed as evidenced by patient report and record review focus on medication adherence by routine       Medication Assistance: None required.  Patient affirms current coverage meets needs.  Compliance/Adherence/Medication fill history: Care Gaps: None  Star-Rating Drugs: Losartan - PDC 96%  Medication Access: Within the past 30 days, how often has patient missed a dose of medication? 0 Is a pillbox or other method used to improve adherence? No  Factors that may affect medication adherence? no barriers identified Are meds synced by current pharmacy? No  Are meds delivered by current pharmacy? No  Does patient experience delays in picking up medications due to transportation concerns? No   [facility] Services Reviewed: Is patient disadvantaged to use [facility]?: No  Current Rx insurance plan: [facility] Name and location of Current pharmacy:  [id number] - [place], [place] [address] Phone: [phone] Fax: [phone]  [facility] services  reviewed with patient today?: No  Patient requests to transfer care to [facility]?: No  Reason patient declined to change pharmacies: Not mentioned at this visit   Care Plan and Follow Up Patient Decision:  Patient agrees to Care Plan and Follow-up.  Plan: The patient has  been provided with contact information for the care management team and has been advised to call with any health related questions or concerns.   Charlene Brooke, PharmD, BCACP Clinical Pharmacist [facility] at [facility] [phone]

## 2021-08-25 ENCOUNTER — Encounter: Payer: Self-pay | Admitting: Family Medicine

## 2021-08-25 NOTE — Patient Instructions (Addendum)
Visit Information  Phone number for Pharmacist: [phone]   Goals Addressed   None     Care Plan : [place]  Updates made by Charlton Haws, RPH since 08/25/2021 12:00 AM     Problem: Hypertension, COPD, Chronic Kidney Disease, Osteopenia, and Mood disorder   Priority: High     Long-Range Goal: Disease mgmt   Start Date: 08/25/2021  Expected End Date: 08/25/2021  This Visit's Progress: On track  Priority: High  Note:   Current Barriers:  DEXA scan results missing  Pharmacist Clinical Goal(s):  Patient will contact provider office for questions/concerns as evidenced notation of same in electronic health record through collaboration with PharmD and provider.   Interventions: 1:1 collaboration with Tower, Wynelle Fanny, MD regarding development and update of comprehensive plan of care as evidenced by provider attestation and co-signature Inter-disciplinary care team collaboration (see longitudinal plan of care) Comprehensive medication review performed; medication list updated in electronic medical record  Hypertension  (BP goal <140/90) -Controlled - per clinic readings -Denies hypotensive/hypertensive symptoms -Current treatment: Amlodipine 10 mg daily PM - Appropriate, Effective, Safe, Accessible Losartan 50 mg daily AM -Appropriate, Effective, Safe, Accessible -Medications previously tried: none reported  -Current home readings: none recent, BP monitor broke -Current dietary habits: drinking a lot of water daily -Current exercise habits: very active -Educated on BP goals and benefits of medications for prevention of heart attack, stroke and kidney damage; -Recommended to continue current medication  COPD (Goal: control symptoms and prevent exacerbations) -Controlled - per patient report -[place] cancer - Stage 1b SCC Dx 05/2021. Undergoing XRT. -Exacerbations requiring treatment in last 6 months: 0 -Current treatment  Trelegy 100-62.5-25 mcg/act 1 puff  daily -Appropriate, Effective, Safe, Accessible Albuterol HFA PRN -Appropriate, Effective, Safe, Accessible -Medications previously tried: Symbicort -Patient reprots consistent use of maintenance inhaler -Frequency of rescue inhaler use: infrequent  -Counseled on Benefits of consistent maintenance inhaler use -Recommended to continue current medication  Mood Disorder (no dx in chart) (Goal: Improve mood) -Controlled - per patient report -Current treatment: Fluoxetine 20 mg daily -Appropriate, Effective, Safe, Accessible Unisom 50 mg daily HS -Appropriate, Effective, Safe, Accessible Melatonin 10 mg daily HS -Appropriate, Effective, Safe, Accessible -Medications previously tried/failed: none reported -PHQ9: 1 (07/16/19) -GAD7: none -She does not sleep well without the melatonin and Unisom nightly. She denies any adverse effects. -Recommended to continue current medication  Osteopenia (Goal prevent fractures) -Query controlled - pt reports she had DEXA scan 05/13/21 (same day as mammogram), results are not in chart -Last DEXA Scan: 09/2018   T-Score femoral neck: -2.3  T-Score total hip: -2.3  T-Score lumbar spine: -2.0  10-year probability of major osteoporotic fracture: 32%  10-year probability of hip fracture: 21% -Current treatment  Calcium-Vitamin D -Appropriate, Effective, Safe, Accessible -Medications previously tried: risedronate x 5 years (stopped 2012) -Recommend weight-bearing and muscle strengthening exercises for building and maintaining bone density. -Will coordinate with [facility] for DEXA results; consider Prolia  Chronic Kidney Disease Stage 3b  -All medications assessed for renal dosing and appropriateness in chronic kidney disease. -Recommended to continue current medication  Health Maintenance -Vaccine gaps: Shingrix #2 today -Hx psoriatic arthritis, on Cosentyx. Follows with rheumatology  Patient Goals/Self-Care Activities Patient will:  - take medications as  prescribed as evidenced by patient report and record review focus on medication adherence by routine       Patient verbalizes understanding of instructions and care plan provided today and agrees to view in [place]. Active MyChart status and  patient understanding of how to access instructions and care plan via MyChart confirmed with patient.    The patient has been provided with contact information for the care management team and has been advised to call with any health related questions or concerns.    Charlene Brooke, PharmD, BCACP Clinical Pharmacist [facility] at [facility] [phone]

## 2021-08-31 ENCOUNTER — Telehealth: Payer: Self-pay | Admitting: Family Medicine

## 2021-08-31 NOTE — Telephone Encounter (Signed)
LVM for pt to rtn my call to schedule AWV with [facility] cakk back # [phone]

## 2021-09-08 ENCOUNTER — Encounter: Payer: Self-pay | Admitting: Radiation Oncology

## 2021-09-08 ENCOUNTER — Ambulatory Visit
Admission: RE | Admit: 2021-09-08 | Discharge: 2021-09-08 | Disposition: A | Discharge status: Home / Self Care | Payer: Medicare Other | Source: Ambulatory Visit | Attending: Radiation Oncology | Admitting: Radiation Oncology

## 2021-09-08 VITALS — BP 134/66 | HR 55 | Temp 98.1°F | Resp 16 | Ht 60.0 in | Wt 134.4 lb

## 2021-09-08 DIAGNOSIS — C3412 Malignant neoplasm of upper lobe, left bronchus or lung: Secondary | ICD-10-CM | POA: Diagnosis not present

## 2021-09-08 DIAGNOSIS — Z923 Personal history of irradiation: Secondary | ICD-10-CM | POA: Diagnosis not present

## 2021-09-08 DIAGNOSIS — C3492 Malignant neoplasm of unspecified part of left bronchus or lung: Secondary | ICD-10-CM

## 2021-09-08 NOTE — Progress Notes (Signed)
Radiation Oncology Follow up Note  Name: Pamela Morales   Date:   09/08/2021 MRN:  [id number] DOB: 04-May-1940    This 81 y.o. 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  REFERRING PROVIDER: Tower, Wynelle Fanny, MD  HPI: Patient is an 81 year old female now at 1 month having completed SBRT to her left upper lobe for stage T2a squamous cell carcinoma seen today in routine follow-up she is doing well she has a slight nonproductive cough.  No hemoptysis.  She does have some slight chest tenderness and no dysphagia..  COMPLICATIONS OF TREATMENT: none  FOLLOW UP COMPLIANCE: keeps appointments   PHYSICAL EXAM:  BP 134/66 (BP Location: Left Arm, Patient Position: Sitting, Cuff Size: Normal)   Pulse (!) 55   Temp 98.1 F (36.7 C) (Tympanic)   Resp 16   Ht 5' (1.524 m)   Wt 134 lb 6.4 oz (61 kg)   BMI 26.25 kg/m  Well-developed well-nourished patient in NAD. HEENT reveals PERLA, EOMI, discs not visualized.  Oral cavity is clear. No oral mucosal lesions are identified. Neck is clear without evidence of cervical or supraclavicular adenopathy. Lungs are clear to A&P. Cardiac examination is essentially unremarkable with regular rate and rhythm without murmur rub or thrill. Abdomen is benign with no organomegaly or masses noted. Motor sensory and DTR levels are equal and symmetric in the upper and lower extremities. Cranial nerves II through XII are grossly intact. Proprioception is intact. No peripheral adenopathy or edema is identified. No motor or sensory levels are noted. Crude visual fields are within normal range.  RADIOLOGY RESULTS: PET scan has been ordered  PLAN: Present time patient is doing well low side effect profile from her SBRT.  She has a PET scan ordered in November I will see her back in follow-up shortly thereafter.  Patient is to call with any concerns.  I would like to take this  opportunity to thank you for allowing me to participate in the care of your patient.[name], MD

## 2021-09-17 ENCOUNTER — Ambulatory Visit (INDEPENDENT_AMBULATORY_CARE_PROVIDER_SITE_OTHER): Payer: Medicare Other | Admitting: Family

## 2021-09-17 ENCOUNTER — Encounter: Payer: Self-pay | Admitting: Family

## 2021-09-17 ENCOUNTER — Ambulatory Visit (INDEPENDENT_AMBULATORY_CARE_PROVIDER_SITE_OTHER)
Admission: RE | Admit: 2021-09-17 | Discharge: 2021-09-17 | Disposition: A | Discharge status: Home / Self Care | Payer: Medicare Other | Source: Ambulatory Visit | Attending: Family | Admitting: Family

## 2021-09-17 VITALS — BP 132/54 | HR 68 | Temp 98.6°F | Resp 16 | Ht 60.0 in | Wt 135.1 lb

## 2021-09-17 DIAGNOSIS — M79672 Pain in left foot: Secondary | ICD-10-CM

## 2021-09-17 DIAGNOSIS — M7989 Other specified soft tissue disorders: Secondary | ICD-10-CM | POA: Diagnosis not present

## 2021-09-17 DIAGNOSIS — S91332A Puncture wound without foreign body, left foot, initial encounter: Secondary | ICD-10-CM | POA: Diagnosis not present

## 2021-09-17 MED ORDER — DOXYCYCLINE HYCLATE 100 MG PO TABS: 100.0000 mg | ORAL_TABLET | Freq: Two times a day (BID) | ORAL | 0 refills | Status: DC

## 2021-09-17 NOTE — Progress Notes (Signed)
Established Patient Office Visit  Subjective:  Patient ID: Pamela Morales, female    DOB: 1940/07/05  Age: 81 y.o. MRN: [id number]  CC:  Chief Complaint  Patient presents with   Foot Injury    X 5 days swollen and hurt when you do something to it     HPI Pamela Morales is here today with concerns.   Five days ago garden sheers fell out of her hand into her left lower anterior foot. Puncture site in top of foot, was wearing flip flops so feet were not protected. She has since started to notice swelling in left lower foot, unable to walk on it and feel swarm to touch. She has drainage at the puncture site that is clear/yellow in nature.   Last TD was 12/10/2018.   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    Cancer ([place]) 02/2008   R breast  (est rec neg and brac neg)   Cataract    FORMING    Complication of anesthesia    deviated septum-lost memory for about 1 week after anesthesia   COPD (chronic obstructive pulmonary disease) ([place])    DDD (degenerative disc disease)    in back with chronic pain   Depression    Diverticulosis    Dyspnea    with exertion   Eczema    Family history of colon cancer    GERD (gastroesophageal reflux disease)    Headache    h/o migraines   Hypertension    Mass of left lung    Osteopenia    Rosacea    [place] 02/2007    Past Surgical History:  Procedure Laterality Date   ABDOMINAL HYSTERECTOMY  1996   BSO fibroids, incidental ovarian CA finding   BASAL CELL CARCINOMA EXCISION  09/2016   bladder tack     BREAST SURGERY     rt total mastectomy for breast CA   CHOLECYSTECTOMY  08/1996   10 yrs ago   COLONOSCOPY     lipoma removal  11/2005   NASAL SEPTUM SURGERY     POLYPECTOMY     VIDEO BRONCHOSCOPY WITH ENDOBRONCHIAL ULTRASOUND Left  06/16/2021   Procedure: POSSIBLE VIDEO BRONCHOSCOPY WITH ENDOBRONCHIAL ULTRASOUND;  Surgeon: Tyler Pita, MD;  Location: [facility] ORS;  Service: Pulmonary;  Laterality: Left;    Family History  Problem Relation Age of Onset   Hypertension Mother    Heart disease Mother        CAD   Stomach cancer Mother 75   Lung cancer Father 60   Cancer Sister        colon, skin   Heart disease Sister 33       MI   Diabetes Sister    Colon cancer Sister    Head & neck cancer Sister 91   Lung cancer Sister    Melanoma Sister        multiple on head   Heart disease Brother        HTN and MI   Hypertension Brother    Cancer Maternal Aunt        unk type   Cancer Paternal Aunt  unk type   Cancer Paternal Uncle        unk type   Colon cancer Daughter 64   Esophageal cancer Neg Hx    Colon polyps Neg Hx     Social History   Socioeconomic History   Marital status: Married    Spouse name: Not on file   Number of children: Not on file   Years of education: Not on file   Highest education level: Not on file  Occupational History   Not on file  Tobacco Use   Smoking status: Former    Packs/day: 2.00    Years: 10.00    Total pack years: 20.00    Types: Cigarettes    Quit date: 01/03/1990    Years since quitting: 31.7   Smokeless tobacco: Never  Vaping Use   Vaping Use: Never used  Substance and Sexual Activity   Alcohol use: Yes    Alcohol/week: 7.0 standard drinks of alcohol    Types: 5 Glasses of wine, 2 Standard drinks or equivalent per week    Comment: occ glass of wine   Drug use: No   Sexual activity: Not Currently  Other Topics Concern   Not on file  Social History Narrative   Not on file   Social Determinants of Health   Financial Resource Strain: Low Risk  (09/19/2020)   Overall Financial Resource Strain (CARDIA)    Difficulty of Paying Living Expenses: Not hard at all  Food Insecurity: No Food Insecurity (09/19/2020)   Hunger Vital Sign    Worried About  Running Out of Food in the Last Year: Never true    Ran Out of Food in the Last Year: Never true  Transportation Needs: No Transportation Needs (09/19/2020)   PRAPARE - Hydrologist (Medical): No    Lack of Transportation (Non-Medical): No  Physical Activity: Sufficiently Active (09/19/2020)   Exercise Vital Sign    Days of Exercise per Week: 4 days    Minutes of Exercise per Session: 90 min  Stress: No Stress Concern Present (09/19/2020)   [employer]    Feeling of Stress : Not at all  Social Connections: [place] (09/19/2020)   Social Connection and Isolation Panel [NHANES]    Frequency of Communication with Friends and Family: More than three times a week    Frequency of Social Gatherings with Friends and Family: Three times a week    Attends Religious Services: More than 4 times per year    Active Member of Clubs or Organizations: Yes    Attends Archivist Meetings: More than 4 times per year    Marital Status: Married  Human resources officer Violence: Not At Risk (09/19/2020)   Humiliation, Afraid, Rape, and Kick questionnaire    Fear of Current or Ex-Partner: No    Emotionally Abused: No    Physically Abused: No    Sexually Abused: No    Outpatient Medications Prior to Visit  Medication Sig Dispense Refill   amLODipine (NORVASC) 10 MG tablet TAKE 1 TABLET BY MOUTH EVERY DAY 90 tablet 1   Calcium Carbonate-Vitamin D (CALTRATE 600+D PO) Take 1,200 mg by mouth every evening.      Cyanocobalamin (B-12 PO) Take 5,000 mg by mouth once a week. (B-12)     Doxylamine Succinate, Sleep, (UNISOM PO) Take 50 mg by mouth at bedtime.     Ferrous Sulfate (IRON) 325 (65 Fe) MG TABS  Take 1 tablet by mouth daily.     FLUoxetine (PROZAC) 20 MG capsule TAKE 2 CAPSULES BY MOUTH EVERY DAY 180 capsule 2   Fluticasone-Umeclidin-Vilant (TRELEGY ELLIPTA) 100-62.5-25 MCG/ACT AEPB Inhale 1 puff into  the lungs daily. 28 each 11   losartan (COZAAR) 50 MG tablet TAKE 1 TABLET BY MOUTH EVERY DAY (Patient taking differently: Take 50 mg by mouth every morning.) 90 tablet 1   MAGNESIUM PO Take 500 mg by mouth daily.     MELATONIN PO Take 10 mg by mouth at bedtime.     Multiple Vitamin (MULTIVITAMIN) tablet Take 1 tablet by mouth daily.     Multiple Vitamins-Minerals (PRESERVISION AREDS 2 PO) Take 1 capsule by mouth 2 (two) times daily.     Secukinumab, 300 MG Dose, (COSENTYX, 300 MG DOSE,) 150 MG/ML SOSY Inject 1 application as directed every 30 (thirty) days.     No facility-administered medications prior to visit.    Allergies  Allergen Reactions   Sulfa Antibiotics     Pt unsure of reaction        Objective:    Physical Exam Constitutional:      General: She is not in acute distress.    Appearance: Normal appearance. She is not ill-appearing, toxic-appearing or diaphoretic.  Musculoskeletal:     Left foot: Decreased range of motion (painful rom can not apply weight to left foot).  Feet:     Left foot:     Skin integrity: Erythema and warmth present.     Comments: Point tenderness to left lateral anterior side of left foot Skin:    General: Skin is warm.     Findings: Erythema (with warmth to site) and wound (puncture wound left medial anterior foot with diffuse edema to foot. clear to yellow drainage. warmth to entire foot) present.  Neurological:     General: No focal deficit present.     Mental Status: She is alert and oriented to person, place, and time. Mental status is at baseline.         BP (!) 132/54   Pulse 68   Temp 98.6 F (37 C)   Resp 16   Ht 5' (1.524 m)   Wt 135 lb 2 oz (61.3 kg)   SpO2 95%   BMI 26.39 kg/m  Wt Readings from Last 3 Encounters:  09/17/21 135 lb 2 oz (61.3 kg)  09/08/21 134 lb 6.4 oz (61 kg)  08/11/21 132 lb 11.2 oz (60.2 kg)     Health Maintenance Due  Topic Date Due   COVID-19 Vaccine (4 - Moderna risk series) 01/18/2020    INFLUENZA VACCINE  08/03/2021    There are no preventive care reminders to display for this patient.  Lab Results  Component Value Date   TSH 4.42 06/18/2020   Lab Results  Component Value Date   WBC 9.3 05/20/2021   HGB 11.0 (L) 05/20/2021   HCT 33.9 (L) 05/20/2021   MCV 83.3 05/20/2021   PLT 300 05/20/2021   Lab Results  Component Value Date   NA 137 05/20/2021   K 4.4 05/20/2021   CO2 27 05/20/2021   GLUCOSE 103 (H) 05/20/2021   BUN 24 (H) 05/20/2021   CREATININE 1.37 (H) 05/20/2021   BILITOT 0.4 05/20/2021   ALKPHOS 70 05/20/2021   AST 18 05/20/2021   ALT 15 05/20/2021   PROT 7.8 05/20/2021   ALBUMIN 3.8 05/20/2021   CALCIUM 9.2 05/20/2021   ANIONGAP 7 05/20/2021   GFR 39.29 (  L) 10/22/2020   No results found for: "HGBA1C"    Assessment & Plan:   Problem List Items Addressed This Visit       Other   Left foot pain    With swelling ,ordering xray foot to r/o osteomyelitis. Elevate foot as able. Warm soaks.       Puncture wound of left foot - Primary    Tetanus up to date.  RX doxycycline 100 mg po bid x 10 days Please monitor site for worsening signs/symptoms of infection to include: increasing redness, increasing tenderness, increase in size, and or pustulant drainage from site. If this is to occur please let me know immediately.   Worry for osteomyelitis, ordering stat left foot xray.        Relevant Medications   doxycycline (VIBRA-TABS) 100 MG tablet   Other Relevant Orders   DG Foot Complete Left   WOUND CULTURE    Meds ordered this encounter  Medications   doxycycline (VIBRA-TABS) 100 MG tablet    Sig: Take 1 tablet (100 mg total) by mouth 2 (two) times daily for 10 days.    Dispense:  20 tablet    Refill:  0    Order Specific Question:   Supervising Provider    Answer:   Diona Browner, AMY E [[id number]]    Follow-up: Return in about 1 week (around 09/24/2021) for f/u wound with Dr. Glori Bickers her pcp .    Eugenia Pancoast, FNP

## 2021-09-17 NOTE — Assessment & Plan Note (Signed)
Tetanus up to date.  RX doxycycline 100 mg po bid x 10 days Please monitor site for worsening signs/symptoms of infection to include: increasing redness, increasing tenderness, increase in size, and or pustulant drainage from site. If this is to occur please let me know immediately.   Worry for osteomyelitis, ordering stat left foot xray.

## 2021-09-17 NOTE — Assessment & Plan Note (Addendum)
With swelling ,ordering xray foot to r/o osteomyelitis. Elevate foot as able. Warm soaks.

## 2021-09-17 NOTE — Patient Instructions (Signed)
Start antibiotic as prescribed.  Please monitor site for worsening signs/symptoms of infection to include: increasing redness, increasing tenderness, increase in size, and or pustulant drainage from site. If this is to occur please let me know immediately.   Complete xray(s) prior to leaving today. I will notify you of your results once received.   Regards,   Eugenia Pancoast FNP-C

## 2021-09-21 ENCOUNTER — Ambulatory Visit (INDEPENDENT_AMBULATORY_CARE_PROVIDER_SITE_OTHER): Payer: Medicare Other

## 2021-09-21 ENCOUNTER — Ambulatory Visit: Payer: Medicare Other

## 2021-09-21 VITALS — Ht 60.0 in | Wt 130.0 lb

## 2021-09-21 DIAGNOSIS — Z0000 Encounter for general adult medical examination without abnormal findings: Secondary | ICD-10-CM

## 2021-09-21 LAB — WOUND CULTURE
MICRO NUMBER:: 13923939
SPECIMEN QUALITY:: ADEQUATE

## 2021-09-21 NOTE — Patient Instructions (Signed)
[name] , Thank you for taking time to come for your Medicare Wellness Visit. I appreciate your ongoing commitment to your health goals. Please review the following plan we discussed and let me know if I can assist you in the future.   Screening recommendations/referrals: Colonoscopy: not required Mammogram: completed 05/13/2021, due 05/15/2022 Bone Density: completed 05/13/2021 Recommended yearly ophthalmology/optometry visit for glaucoma screening and checkup Recommended yearly dental visit for hygiene and checkup  Vaccinations: Influenza vaccine: due Pneumococcal vaccine: completed 04/28/2014 Tdap vaccine: completed 12/10/2018, due 12/09/2028 Shingles vaccine: completed   Covid-19: 11/23/2019, 02/26/2019, 01/29/2019  Advanced directives: Please bring a copy of your POA (Power of Attorney) and/or Living Will to your next appointment.   Conditions/risks identified: none  Next appointment: Follow up in one year for your annual wellness visit    Preventive Care 65 Years and Older, Female Preventive care refers to lifestyle choices and visits with your health care provider that can promote health and wellness. What does preventive care include? A yearly physical exam. This is also called an annual well check. Dental exams once or twice a year. Routine eye exams. Ask your health care provider how often you should have your eyes checked. Personal lifestyle choices, including: Daily care of your teeth and gums. Regular physical activity. Eating a healthy diet. Avoiding tobacco and drug use. Limiting alcohol use. Practicing safe sex. Taking low-dose aspirin every day. Taking vitamin and mineral supplements as recommended by your health care provider. What happens during an annual well check? The services and screenings done by your health care provider during your annual well check will depend on your age, overall health, lifestyle risk factors, and family history of disease. Counseling   Your health care provider may ask you questions about your: Alcohol use. Tobacco use. Drug use. Emotional well-being. Home and relationship well-being. Sexual activity. Eating habits. History of falls. Memory and ability to understand (cognition). Work and work Statistician. Reproductive health. Screening  You may have the following tests or measurements: Height, weight, and BMI. Blood pressure. Lipid and cholesterol levels. These may be checked every 5 years, or more frequently if you are over 70 years old. Skin check. Lung cancer screening. You may have this screening every year starting at age 3 if you have a 30-pack-year history of smoking and currently smoke or have quit within the past 15 years. Fecal occult blood test (FOBT) of the stool. You may have this test every year starting at age 4. Flexible sigmoidoscopy or colonoscopy. You may have a sigmoidoscopy every 5 years or a colonoscopy every 10 years starting at age 64. Hepatitis C blood test. Hepatitis B blood test. Sexually transmitted disease (STD) testing. Diabetes screening. This is done by checking your blood sugar (glucose) after you have not eaten for a while (fasting). You may have this done every 1-3 years. Bone density scan. This is done to screen for osteoporosis. You may have this done starting at age 68. Mammogram. This may be done every 1-2 years. Talk to your health care provider about how often you should have regular mammograms. Talk with your health care provider about your test results, treatment options, and if necessary, the need for more tests. Vaccines  Your health care provider may recommend certain vaccines, such as: Influenza vaccine. This is recommended every year. Tetanus, diphtheria, and acellular pertussis (Tdap, Td) vaccine. You may need a Td booster every 10 years. Zoster vaccine. You may need this after age 59. Pneumococcal 13-valent conjugate (PCV13) vaccine. One  dose is recommended  after age 67. Pneumococcal polysaccharide (PPSV23) vaccine. One dose is recommended after age 37. Talk to your health care provider about which screenings and vaccines you need and how often you need them. This information is not intended to replace advice given to you by your health care provider. Make sure you discuss any questions you have with your health care provider. Document Released: 01/16/2015 Document Revised: 09/09/2015 Document Reviewed: 10/21/2014 Elsevier Interactive Patient Education  2017 [employer] Prevention in the Home Falls can cause injuries. They can happen to people of all ages. There are many things you can do to make your home safe and to help prevent falls. What can I do on the outside of my home? Regularly fix the edges of walkways and driveways and fix any cracks. Remove anything that might make you trip as you walk through a door, such as a raised step or threshold. Trim any bushes or trees on the path to your home. Use bright outdoor lighting. Clear any walking paths of anything that might make someone trip, such as rocks or tools. Regularly check to see if handrails are loose or broken. Make sure that both sides of any steps have handrails. Any raised decks and porches should have guardrails on the edges. Have any leaves, snow, or ice cleared regularly. Use sand or salt on walking paths during winter. Clean up any spills in your garage right away. This includes oil or grease spills. What can I do in the bathroom? Use night lights. Install grab bars by the toilet and in the tub and shower. Do not use towel bars as grab bars. Use non-skid mats or decals in the tub or shower. If you need to sit down in the shower, use a plastic, non-slip stool. Keep the floor dry. Clean up any water that spills on the floor as soon as it happens. Remove soap buildup in the tub or shower regularly. Attach bath mats securely with double-sided non-slip rug tape. Do not  have throw rugs and other things on the floor that can make you trip. What can I do in the bedroom? Use night lights. Make sure that you have a light by your bed that is easy to reach. Do not use any sheets or blankets that are too big for your bed. They should not hang down onto the floor. Have a firm chair that has side arms. You can use this for support while you get dressed. Do not have throw rugs and other things on the floor that can make you trip. What can I do in the kitchen? Clean up any spills right away. Avoid walking on wet floors. Keep items that you use a lot in easy-to-reach places. If you need to reach something above you, use a strong step stool that has a grab bar. Keep electrical cords out of the way. Do not use floor polish or wax that makes floors slippery. If you must use wax, use non-skid floor wax. Do not have throw rugs and other things on the floor that can make you trip. What can I do with my stairs? Do not leave any items on the stairs. Make sure that there are handrails on both sides of the stairs and use them. Fix handrails that are broken or loose. Make sure that handrails are as long as the stairways. Check any carpeting to make sure that it is firmly attached to the stairs. Fix any carpet that is loose or worn.  Avoid having throw rugs at the top or bottom of the stairs. If you do have throw rugs, attach them to the floor with carpet tape. Make sure that you have a light switch at the top of the stairs and the bottom of the stairs. If you do not have them, ask someone to add them for you. What else can I do to help prevent falls? Wear shoes that: Do not have high heels. Have rubber bottoms. Are comfortable and fit you well. Are closed at the toe. Do not wear sandals. If you use a stepladder: Make sure that it is fully opened. Do not climb a closed stepladder. Make sure that both sides of the stepladder are locked into place. Ask someone to hold it for  you, if possible. Clearly mark and make sure that you can see: Any grab bars or handrails. First and last steps. Where the edge of each step is. Use tools that help you move around (mobility aids) if they are needed. These include: Canes. Walkers. Scooters. Crutches. Turn on the lights when you go into a dark area. Replace any light bulbs as soon as they burn out. Set up your furniture so you have a clear path. Avoid moving your furniture around. If any of your floors are uneven, fix them. If there are any pets around you, be aware of where they are. Review your medicines with your doctor. Some medicines can make you feel dizzy. This can increase your chance of falling. Ask your doctor what other things that you can do to help prevent falls. This information is not intended to replace advice given to you by your health care provider. Make sure you discuss any questions you have with your health care provider. Document Released: 10/16/2008 Document Revised: 05/28/2015 Document Reviewed: 01/24/2014 Elsevier Interactive Patient Education  2017 [employer].

## 2021-09-21 NOTE — Progress Notes (Signed)
I connected with Pamela Morales today by telephone and verified that I am speaking with the correct person using two identifiers. Location patient: home Location provider: work Persons participating in the virtual visit: Prudy Feeler LPN.   I discussed the limitations, risks, security and privacy concerns of performing an evaluation and management service by telephone and the availability of in person appointments. I also discussed with the patient that there may be a patient responsible charge related to this service. The patient expressed understanding and verbally consented to this telephonic visit.    Interactive audio and video telecommunications were attempted between this provider and patient, however failed, due to patient having technical difficulties OR patient did not have access to video capability.  We continued and completed visit with audio only.     Vital signs may be patient reported or missing.  Subjective:   Pamela Morales is a 81 y.o. female who presents for Medicare Annual (Subsequent) preventive examination.  Review of Systems     Cardiac Risk Factors include: advanced age (>81men, >73 women);hypertension     Objective:    Today's Vitals   09/21/21 1356  Weight: 130 lb (59 kg)  Height: 5' (1.524 m)   Body mass index is 25.39 kg/m.     09/21/2021    2:02 PM 09/08/2021   10:44 AM 08/11/2021   10:31 AM 07/01/2021   10:45 AM 06/23/2021   11:05 AM 06/16/2021   11:13 AM 06/10/2021    2:35 PM  Advanced Directives  Does Patient Have a Medical Advance Directive? Yes Yes Yes Yes Yes Yes Yes  Type of Paramedic of [place];Living will [place];Living will [place];Living will [place];Living will [place];Living will [place];Living will   Does patient want to make changes to medical advance directive?  No - Patient declined  No - Patient  declined  No - Patient declined   Copy of [place] in Chart? No - copy requested No - copy requested  No - copy requested No - copy requested No - copy requested     Current Medications (verified) Outpatient Encounter Medications as of 09/21/2021  Medication Sig   amLODipine (NORVASC) 10 MG tablet TAKE 1 TABLET BY MOUTH EVERY DAY   Calcium Carbonate-Vitamin D (CALTRATE 600+D PO) Take 1,200 mg by mouth every evening.    doxycycline (VIBRA-TABS) 100 MG tablet Take 1 tablet (100 mg total) by mouth 2 (two) times daily for 10 days.   Doxylamine Succinate, Sleep, (UNISOM PO) Take 50 mg by mouth at bedtime.   Ferrous Sulfate (IRON) 325 (65 Fe) MG TABS Take 1 tablet by mouth daily.   FLUoxetine (PROZAC) 20 MG capsule TAKE 2 CAPSULES BY MOUTH EVERY DAY   Fluticasone-Umeclidin-Vilant (TRELEGY ELLIPTA) 100-62.5-25 MCG/ACT AEPB Inhale 1 puff into the lungs daily.   losartan (COZAAR) 50 MG tablet TAKE 1 TABLET BY MOUTH EVERY DAY (Patient taking differently: Take 50 mg by mouth every morning.)   MAGNESIUM PO Take 500 mg by mouth daily.   MELATONIN PO Take 10 mg by mouth at bedtime.   Multiple Vitamin (MULTIVITAMIN) tablet Take 1 tablet by mouth daily.   Multiple Vitamins-Minerals (PRESERVISION AREDS 2 PO) Take 1 capsule by mouth 2 (two) times daily.   Secukinumab, 300 MG Dose, (COSENTYX, 300 MG DOSE,) 150 MG/ML SOSY Inject 1 application as directed every 30 (thirty) days.   Cyanocobalamin (B-12 PO) Take 5,000  mg by mouth once a week. (B-12) (Patient not taking: Reported on 09/21/2021)   No facility-administered encounter medications on file as of 09/21/2021.    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    Cancer ([place]) 02/2008   R  breast  (est rec neg and brac neg)   Cataract    FORMING    Complication of anesthesia    deviated septum-lost memory for about 1 week after anesthesia   COPD (chronic obstructive pulmonary disease) ([place])    DDD (degenerative disc disease)    in back with chronic pain   Depression    Diverticulosis    Dyspnea    with exertion   Eczema    Family history of colon cancer    GERD (gastroesophageal reflux disease)    Headache    h/o migraines   Hypertension    Mass of left lung    Osteopenia    Rosacea    Zoster 02/2007   Past Surgical History:  Procedure Laterality Date   ABDOMINAL HYSTERECTOMY  1996   BSO fibroids, incidental ovarian CA finding   BASAL CELL CARCINOMA EXCISION  09/2016   bladder tack     BREAST SURGERY     rt total mastectomy for breast CA   CHOLECYSTECTOMY  08/1996   10 yrs ago   COLONOSCOPY     lipoma removal  11/2005   NASAL SEPTUM SURGERY     POLYPECTOMY     VIDEO BRONCHOSCOPY WITH ENDOBRONCHIAL ULTRASOUND Left 06/16/2021   Procedure: POSSIBLE VIDEO BRONCHOSCOPY WITH ENDOBRONCHIAL ULTRASOUND;  Surgeon: Tyler Pita, MD;  Location: [facility] ORS;  Service: Pulmonary;  Laterality: Left;   Family History  Problem Relation Age of Onset   Hypertension Mother    Heart disease Mother        CAD   Stomach cancer Mother 52   Lung cancer Father 70   Cancer Sister        colon, skin   Heart disease Sister 6       MI   Diabetes Sister    Colon cancer Sister    Head & neck cancer Sister 37   Lung cancer Sister    Melanoma Sister        multiple on head   Heart disease Brother        HTN and MI   Hypertension Brother    Cancer Maternal Aunt        unk type   Cancer Paternal Aunt        unk type   Cancer Paternal Uncle        unk type   Colon cancer Daughter 86   Esophageal cancer Neg Hx    Colon polyps Neg Hx    Social History   Socioeconomic History   Marital status: Married    Spouse name: Not on file   Number of children: Not on file    Years of education: Not on file   Highest education level: Not on file  Occupational History   Not on file  Tobacco Use   Smoking status: Former    Packs/day: 2.00    Years: 10.00  Total pack years: 20.00    Types: Cigarettes    Quit date: 01/03/1990    Years since quitting: 31.7   Smokeless tobacco: Never  Vaping Use   Vaping Use: Never used  Substance and Sexual Activity   Alcohol use: Yes    Alcohol/week: 5.0 standard drinks of alcohol    Types: 5 Glasses of wine per week    Comment: occ glass of wine   Drug use: No   Sexual activity: Not Currently  Other Topics Concern   Not on file  Social History Narrative   Not on file   Social Determinants of Health   Financial Resource Strain: Low Risk  (09/21/2021)   Overall Financial Resource Strain (CARDIA)    Difficulty of Paying Living Expenses: Not hard at all  Food Insecurity: No Food Insecurity (09/21/2021)   Hunger Vital Sign    Worried About Running Out of Food in the Last Year: Never true    Ran Out of Food in the Last Year: Never true  Transportation Needs: No Transportation Needs (09/21/2021)   PRAPARE - Hydrologist (Medical): No    Lack of Transportation (Non-Medical): No  Physical Activity: Inactive (09/21/2021)   Exercise Vital Sign    Days of Exercise per Week: 0 days    Minutes of Exercise per Session: 0 min  Stress: No Stress Concern Present (09/19/2020)   [place]    Feeling of Stress : Not at all  Social Connections: [place] (09/19/2020)   Social Connection and Isolation Panel [NHANES]    Frequency of Communication with Friends and Family: More than three times a week    Frequency of Social Gatherings with Friends and Family: Three times a week    Attends Religious Services: More than 4 times per year    Active Member of Clubs or Organizations: Yes    Attends Music therapist: More  than 4 times per year    Marital Status: Married    Tobacco Counseling Counseling given: Not Answered   Clinical Intake:  Pre-visit preparation completed: Yes  Pain : No/denies pain     Nutritional Status: BMI 25 -29 Overweight Nutritional Risks: None Diabetes: No  How often do you need to have someone help you when you read instructions, pamphlets, or other written materials from your doctor or pharmacy?: 1 - Never  Diabetic? no  Interpreter Needed?: No  Information entered by :: NAllen LPN   Activities of Daily Living    09/21/2021    2:04 PM 06/10/2021    2:28 PM  In your present state of health, do you have any difficulty performing the following activities:  Hearing? 0   Comment has hearing aids   Vision? 0   Difficulty concentrating or making decisions? 0   Walking or climbing stairs? 1   Comment due to foot   Dressing or bathing? 0   Doing errands, shopping? 0 0  Preparing Food and eating ? N   Using the Toilet? N   In the past six months, have you accidently leaked urine? Y   Comment with cough, laugh or sneeze   Do you have problems with loss of bowel control? N   Managing your Medications? N   Managing your Finances? N   Housekeeping or managing your Housekeeping? N     Patient Care Team: [place], Wynelle Fanny, MD as PCP - Patsi Sears, MD as Consulting  Physician (Dermatology) Webb Laws, [place] as Consulting Physician (Optometry) Fanny Skates, MD as Consulting Physician (General Surgery) Telford Nab, RN as Oncology Nurse Navigator [name], Kathlene November, MD as Consulting Physician (Oncology) Charlton Haws, [facility] as Pharmacist (Pharmacist)  Indicate any recent Medical Services you may have received from other than [facility] providers in the past year (date may be approximate).     Assessment:   This is a routine wellness examination for [facility].  Hearing/Vision screen Vision Screening - Comments:: Regular eye exams, Dr.  Zigmund Daniel  Dietary issues and exercise activities discussed: Current Exercise Habits: The patient does not participate in regular exercise at present   Goals Addressed             This Visit's Progress    Patient Stated       09/21/2021, wants to get foot healed       Depression Screen    09/21/2021    2:04 PM 09/19/2020   11:01 AM 07/16/2019   12:44 PM 10/12/2016    3:14 PM 03/24/2015    2:05 PM 08/05/2012    9:48 AM  PHQ 2/9 Scores  PHQ - 2 Score 0 0 0 0 0 0  PHQ- 9 Score   1 0      Fall Risk    09/21/2021    2:03 PM 09/19/2020   11:04 AM 06/18/2020   10:39 AM 08/02/2018    9:37 AM 10/12/2016    3:14 PM  Fall Risk   Falls in the past year? 1 0 0 0 No  Comment tripped   Emmi Telephone Survey: data to providers prior to load   Number falls in past yr: 1 0 0    Injury with Fall? 1 0 0    Comment lacerated head      Risk for fall due to : Impaired balance/gait;Impaired mobility;Medication side effect      Follow up Falls evaluation completed;Education provided;Falls prevention discussed Falls evaluation completed       FALL RISK PREVENTION PERTAINING TO THE HOME:  Any stairs in or around the home? Yes  If so, are there any without handrails? No  Home free of loose throw rugs in walkways, pet beds, electrical cords, etc? Yes  Adequate lighting in your home to reduce risk of falls? Yes   ASSISTIVE DEVICES UTILIZED TO PREVENT FALLS:  Life alert? No  Use of a cane, walker or w/c? Yes  Grab bars in the bathroom? No  Shower chair or bench in shower? No  Elevated toilet seat or a handicapped toilet? Yes   TIMED UP AND GO:  Was the test performed? No .      Cognitive Function:    10/12/2016    3:15 PM 03/24/2015    2:15 PM  MMSE - Mini Mental State Exam  Orientation to time 5 5  Orientation to Place 5 5  Registration 3 3  Attention/ Calculation 0 5  Recall 3 1  Language- name 2 objects 0 0  Language- repeat 1 1  Language- follow 3 step command 3 3   Language- read & follow direction 0 1  Write a sentence 0 0  Copy design 0 0  Total score 20 24        09/21/2021    2:07 PM 09/19/2020   11:06 AM  6CIT Screen  What Year? 0 points 0 points  What month? 0 points 0 points  What time? 0 points 0 points  Count back from 20  0 points 0 points  Months in reverse 0 points 0 points  Repeat phrase 0 points   Total Score 0 points     Immunizations Immunization History  Administered Date(s) Administered   Fluad Quad(high Dose 65+) 01/01/2020   Influenza Split 10/11/2011   Influenza Whole 10/04/2006   Influenza, High Dose Seasonal PF 10/12/2016, 10/30/2017, 10/04/2018, 01/01/2020   Influenza,inj,Quad PF,6+ Mos 09/19/2012, 10/31/2013, 10/10/2014   Influenza-Unspecified 09/18/2015   Moderna Sars-Covid-2 Vaccination 01/29/2019, 02/26/2019, 11/23/2019   Pneumococcal Conjugate-13 04/28/2014   Pneumococcal Polysaccharide-23 08/03/2012   Td 01/03/1994, 07/17/2008, 12/10/2018   Zoster Recombinat (Shingrix) 03/25/2021, 08/23/2021    TDAP status: Up to date  Flu Vaccine status: Due, Education has been provided regarding the importance of this vaccine. Advised may receive this vaccine at local pharmacy or Health Dept. Aware to provide a copy of the vaccination record if obtained from local pharmacy or Health Dept. Verbalized acceptance and understanding.  Pneumococcal vaccine status: Up to date  Covid-19 vaccine status: Completed vaccines  Qualifies for Shingles Vaccine? Yes   Zostavax completed Yes   Shingrix Completed?: Yes  Screening Tests Health Maintenance  Topic Date Due   COVID-19 Vaccine (4 - Moderna risk series) 01/18/2020   INFLUENZA VACCINE  08/03/2021   MAMMOGRAM  05/14/2022   COLONOSCOPY (Pts 45-36yrs Insurance coverage will need to be confirmed)  09/21/2023   TETANUS/TDAP  12/09/2028   Pneumonia Vaccine 40+ Years old  Completed   DEXA SCAN  Completed   Zoster Vaccines- Shingrix  Completed   HPV VACCINES  Aged Out     Health Maintenance  Health Maintenance Due  Topic Date Due   COVID-19 Vaccine (4 - Moderna risk series) 01/18/2020   INFLUENZA VACCINE  08/03/2021    Colorectal cancer screening: No longer required.   Mammogram status: Completed 05/13/2021. Repeat every year  Bone Density status: Completed 05/13/2021.   Lung Cancer Screening: (Low Dose CT Chest recommended if Age 33-80 years, 30 pack-year currently smoking OR have quit w/in 15years.) does not qualify.   Lung Cancer Screening Referral: no  Additional Screening:  Hepatitis C Screening: does not qualify;   Vision Screening: Recommended annual ophthalmology exams for early detection of glaucoma and other disorders of the eye. Is the patient up to date with their annual eye exam?  Yes  Who is the provider or what is the name of the office in which the patient attends annual eye exams? Dr. Zigmund Daniel If pt is not established with a provider, would they like to be referred to a provider to establish care? No .   Dental Screening: Recommended annual dental exams for proper oral hygiene  Community Resource Referral / Chronic Care Management: CRR required this visit?  No   CCM required this visit?  No      Plan:     I have personally reviewed and noted the following in the patient's chart:   Medical and social history Use of alcohol, tobacco or illicit drugs  Current medications and supplements including opioid prescriptions. Patient is not currently taking opioid prescriptions. Functional ability and status Nutritional status Physical activity Advanced directives List of other physicians Hospitalizations, surgeries, and ER visits in previous 12 months Vitals Screenings to include cognitive, depression, and falls Referrals and appointments  In addition, I have reviewed and discussed with patient certain preventive protocols, quality metrics, and best practice recommendations. A written personalized care plan for  preventive services as well as general preventive health recommendations were provided to patient.  Kellie Simmering, LPN   09/27/4626   Nurse Notes: none  Due to this being a virtual visit, the after visit summary with patients personalized plan was offered to patient via mail or my-chart.  Patient would like to access on my-chart

## 2021-09-23 ENCOUNTER — Ambulatory Visit (INDEPENDENT_AMBULATORY_CARE_PROVIDER_SITE_OTHER): Payer: Medicare Other | Admitting: Family Medicine

## 2021-09-23 ENCOUNTER — Encounter: Payer: Self-pay | Admitting: Family Medicine

## 2021-09-23 DIAGNOSIS — M79672 Pain in left foot: Secondary | ICD-10-CM | POA: Diagnosis not present

## 2021-09-23 MED ORDER — CLINDAMYCIN HCL 300 MG PO CAPS: 300.0000 mg | ORAL_CAPSULE | Freq: Three times a day (TID) | ORAL | 0 refills | Status: DC

## 2021-09-23 NOTE — Patient Instructions (Signed)
Stop the doxycycline   Start the clindamycin 300 mg three times daily -with food is ok   If side effects (GI) -hold it and call and let us know   Elevate foot whenever you can (when sitting)   Watch for more pain or redness or swelling -let us know asap  Also watch for fever Go to ER if suddenly much worse   Follow up next week

## 2021-09-23 NOTE — Assessment & Plan Note (Signed)
Wound from shears, mild imp with doxycycline Wound cx grew MRSA Some swelling and erythema remain/ no drainage In light of slow progress-abx changed to clindamycin 300 mg tid  She cannot take sulfa Disc wound care (clean) and elevation  Last xr reviewed  Watch for inc in redness/swelling or any fever or drainage ER precautions noted  F/u mid next wk for re check

## 2021-09-23 NOTE — Progress Notes (Signed)
Subjective:    Patient ID: Pamela Morales, female    DOB: 01-Dec-1940, 81 y.o.   MRN: [id number]  HPI Pt presents for f/u of wound on foot   Wt Readings from Last 3 Encounters:  09/23/21 133 lb 8 oz (60.6 kg)  09/21/21 130 lb (59 kg)  09/17/21 135 lb 2 oz (61.3 kg)   26.07 kg/m  Saw NP Dugal on 9/15 after puncture wound from garden shears (left) -records reviewed)  She then developed redness and swelling and some bruising  Reassuring xray  Px doxycycline 100 mg bid for 10 d   Tetanus updated 12/2018   Recently treated for lung cancer   Had xray DG Foot Complete Left  Result Date: 09/17/2021 CLINICAL DATA:  Recent injury to left foot with pain and swelling. EXAM: LEFT FOOT - COMPLETE 3+ VIEW COMPARISON:  01/27/2020 FINDINGS: There is no evidence of fracture or dislocation. There is no evidence of arthropathy or other focal bone abnormality. Soft tissue swelling present around the region of the metatarsals. No visible soft tissue foreign body. IMPRESSION: Left foot soft tissue swelling without evidence of fracture or soft tissue foreign body. Electronically Signed   By: Aletta Edouard M.D.   On: 09/17/2021 11:18    Now  Swelling is not as bad  Still sore (only hurts to walk on) if sitting-no pain at all  Hard to walk on-she is using a walker   Still some redness    Some drainage  Mrsa -on culture  Noted doxycycline should work -is taking  Also sensitive to cipro and sulfa (allergic) and clindamycin  Patient Active Problem List   Diagnosis Date Noted   Puncture wound of left foot 09/17/2021   Primary localized osteoarthrosis of multiple sites 08/11/2021   Other long term (current) drug therapy 08/11/2021   Genetic testing 07/07/2021   Squamous cell carcinoma of left lung ([place]) 06/18/2021   Personal history of ovarian cancer 06/15/2021   Family history of melanoma 06/15/2021   Family history of stomach cancer 06/15/2021   Mass of left lung 05/14/2021   CRI (chronic  renal insufficiency) 10/23/2020   Left foot pain 01/27/2020   High triglycerides 07/16/2019   COPD (chronic obstructive pulmonary disease) ([place]) 07/12/2018   Elevated TSH 10/18/2016   Elevated serum creatinine 10/18/2016   Estrogen deficiency 03/23/2016   Psoriatic arthritis ([place]) 03/31/2015   Heartburn 03/03/2015   Family history of colon cancer 01/01/2013   Personal history of colonic polyps 01/01/2013   Hearing loss 09/19/2012   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   BACK PAIN WITH RADICULOPATHY 03/15/2007   History of herpes zoster 02/23/2007   Essential hypertension 02/05/2007   Osteopenia 02/05/2007   ECZEMA, ATOPIC 01/29/2007   ROSACEA 01/29/2007   BASAL CELL CARCINOMA, HX OF 01/29/2007   Past Medical History:  Diagnosis Date   Allergy    Anemia    PAST HX    Anginal pain ([place])    Breast cancer ([facility])    C1 cervical fracture ([facility])    placed in c-collar-to f/u with neurosurgery on 06-21-21 in [place] - cancer of ovary    Cancer ([place]) 02/2008   R breast  (est rec neg and brac neg)   Cataract    FORMING    Complication of anesthesia    deviated septum-lost memory for about 1 week after anesthesia   COPD (chronic obstructive pulmonary disease) ([place])    DDD (degenerative disc disease)  in back with chronic pain   Depression    Diverticulosis    Dyspnea    with exertion   Eczema    Family history of colon cancer    GERD (gastroesophageal reflux disease)    Headache    h/o migraines   Hypertension    Mass of left lung    Osteopenia    Rosacea    Zoster 02/2007   Past Surgical History:  Procedure Laterality Date   ABDOMINAL HYSTERECTOMY  1996   BSO fibroids, incidental ovarian CA finding   BASAL CELL CARCINOMA EXCISION  09/2016   bladder tack     BREAST SURGERY     rt total mastectomy for breast CA   CHOLECYSTECTOMY  08/1996   10 yrs ago   COLONOSCOPY     lipoma removal  11/2005   NASAL SEPTUM SURGERY     POLYPECTOMY     VIDEO  BRONCHOSCOPY WITH ENDOBRONCHIAL ULTRASOUND Left 06/16/2021   Procedure: POSSIBLE VIDEO BRONCHOSCOPY WITH ENDOBRONCHIAL ULTRASOUND;  Surgeon: Tyler Pita, MD;  Location: [facility] ORS;  Service: Pulmonary;  Laterality: Left;   Social History   Tobacco Use   Smoking status: Former    Packs/day: 2.00    Years: 10.00    Total pack years: 20.00    Types: Cigarettes    Quit date: 01/03/1990    Years since quitting: 31.7   Smokeless tobacco: Never  Vaping Use   Vaping Use: Never used  Substance Use Topics   Alcohol use: Yes    Alcohol/week: 5.0 standard drinks of alcohol    Types: 5 Glasses of wine per week    Comment: occ glass of wine   Drug use: No   Family History  Problem Relation Age of Onset   Hypertension Mother    Heart disease Mother        CAD   Stomach cancer Mother 26   Lung cancer Father 81   Cancer Sister        colon, skin   Heart disease Sister 9       MI   Diabetes Sister    Colon cancer Sister    Head & neck cancer Sister 51   Lung cancer Sister    Melanoma Sister        multiple on head   Heart disease Brother        HTN and MI   Hypertension Brother    Cancer Maternal Aunt        unk type   Cancer Paternal Aunt        unk type   Cancer Paternal Uncle        unk type   Colon cancer Daughter 41   Esophageal cancer Neg Hx    Colon polyps Neg Hx    Allergies  Allergen Reactions   Sulfa Antibiotics     Pt unsure of reaction   Current Outpatient Medications on File Prior to Visit  Medication Sig Dispense Refill   amLODipine (NORVASC) 10 MG tablet TAKE 1 TABLET BY MOUTH EVERY DAY 90 tablet 1   Calcium Carbonate-Vitamin D (CALTRATE 600+D PO) Take 1,200 mg by mouth every evening.      Cyanocobalamin (B-12 PO) Take 5,000 mg by mouth once a week. (B-12)     Doxylamine Succinate, Sleep, (UNISOM PO) Take 50 mg by mouth at bedtime.     Ferrous Sulfate (IRON) 325 (65 Fe) MG TABS Take 1 tablet by mouth daily.  FLUoxetine (PROZAC) 20 MG capsule TAKE 2  CAPSULES BY MOUTH EVERY DAY 180 capsule 2   Fluticasone-Umeclidin-Vilant (TRELEGY ELLIPTA) 100-62.5-25 MCG/ACT AEPB Inhale 1 puff into the lungs daily. 28 each 11   losartan (COZAAR) 50 MG tablet TAKE 1 TABLET BY MOUTH EVERY DAY (Patient taking differently: Take 50 mg by mouth every morning.) 90 tablet 1   MAGNESIUM PO Take 500 mg by mouth daily.     MELATONIN PO Take 10 mg by mouth at bedtime.     Multiple Vitamin (MULTIVITAMIN) tablet Take 1 tablet by mouth daily.     Multiple Vitamins-Minerals (PRESERVISION AREDS 2 PO) Take 1 capsule by mouth 2 (two) times daily.     Secukinumab, 300 MG Dose, (COSENTYX, 300 MG DOSE,) 150 MG/ML SOSY Inject 1 application as directed every 30 (thirty) days.     No current facility-administered medications on file prior to visit.     Review of Systems  Constitutional:  Negative for activity change, appetite change, fatigue, fever and unexpected weight change.  HENT:  Negative for congestion, ear pain, rhinorrhea, sinus pressure and sore throat.   Eyes:  Negative for pain, redness and visual disturbance.  Respiratory:  Negative for cough, shortness of breath and wheezing.   Cardiovascular:  Negative for chest pain and palpitations.  Gastrointestinal:  Negative for abdominal pain, blood in stool, constipation and diarrhea.  Endocrine: Negative for polydipsia and polyuria.  Genitourinary:  Negative for dysuria, frequency and urgency.  Musculoskeletal:  Negative for arthralgias, back pain and myalgias.       L foot swelling with wound  Skin:  Positive for wound. Negative for pallor and rash.  Allergic/Immunologic: Negative for environmental allergies.  Neurological:  Negative for dizziness, syncope and headaches.  Hematological:  Negative for adenopathy. Does not bruise/bleed easily.  Psychiatric/Behavioral:  Negative for decreased concentration and dysphoric mood. The patient is not nervous/anxious.        Objective:   Physical Exam Constitutional:       General: She is not in acute distress.    Appearance: Normal appearance. She is normal weight. She is not ill-appearing or diaphoretic.  HENT:     Head: Normocephalic and atraumatic.  Eyes:     General:        Right eye: No discharge.        Left eye: No discharge.     Conjunctiva/sclera: Conjunctivae normal.     Pupils: Pupils are equal, round, and reactive to light.  Cardiovascular:     Rate and Rhythm: Normal rate and regular rhythm.     Pulses: Normal pulses.  Pulmonary:     Effort: Pulmonary effort is normal. No respiratory distress.     Breath sounds: Normal breath sounds. No wheezing.     Comments: Diffusely distant bs  Musculoskeletal:     Cervical back: Neck supple.     Comments: Swelling of L foot  Some old ecchymosis -noted more dependent and medially  Small scab from wound on top of foot- 1 cm collar or erythema  Mild tenderness of dorsal foot  Has pain with ambulation   Lymphadenopathy:     Cervical: No cervical adenopathy.  Neurological:     Mental Status: She is alert.     Sensory: No sensory deficit.     Motor: No weakness.  Psychiatric:        Mood and Affect: Mood normal.               Assessment & Plan:  Problem List Items Addressed This Visit       Other   Left foot pain    Wound from shears, mild imp with doxycycline Wound cx grew MRSA Some swelling and erythema remain/ no drainage In light of slow progress-abx changed to clindamycin 300 mg tid  She cannot take sulfa Disc wound care (clean) and elevation  Last xr reviewed  Watch for inc in redness/swelling or any fever or drainage ER precautions noted  F/u mid next wk for re check

## 2021-09-29 ENCOUNTER — Ambulatory Visit (INDEPENDENT_AMBULATORY_CARE_PROVIDER_SITE_OTHER): Payer: Medicare Other | Admitting: Family Medicine

## 2021-09-29 ENCOUNTER — Encounter: Payer: Self-pay | Admitting: Family Medicine

## 2021-09-29 VITALS — BP 124/62 | HR 87 | Temp 97.7°F | Ht 60.0 in | Wt 134.1 lb

## 2021-09-29 DIAGNOSIS — S91332A Puncture wound without foreign body, left foot, initial encounter: Secondary | ICD-10-CM | POA: Diagnosis not present

## 2021-09-29 DIAGNOSIS — M79672 Pain in left foot: Secondary | ICD-10-CM | POA: Diagnosis not present

## 2021-09-29 DIAGNOSIS — K1370 Unspecified lesions of oral mucosa: Secondary | ICD-10-CM | POA: Insufficient documentation

## 2021-09-29 NOTE — Patient Instructions (Addendum)
Do some salt water gargle /mouth swish   Try ambesol (for the discomfort)   Leave your bottom denture out for periods of time when you don't need it   If this continues - have your dentist check again    Pamela Morales the clindamycin then update me with how you are  If still having swelling or pain we may need to extend it   Elevate your foot whenever you sit    Watch for increased pain /swelling or redness  Wound looks much better today !

## 2021-09-29 NOTE — Progress Notes (Signed)
Subjective:    Patient ID: Pamela Morales, female    DOB: 13-Oct-1940, 81 y.o.   MRN: [id number]  HPI Pt presents for follow up of puncture wound on foot /cellulitis  Also lesion in mouth   Wt Readings from Last 3 Encounters:  09/29/21 134 lb 2 oz (60.8 kg)  09/23/21 133 lb 8 oz (60.6 kg)  09/21/21 130 lb (59 kg)   26.19 kg/m  Last visit we px clindamycin for a foot puncture wound with mrsa (tx prior with doxy)   Now much improved  Less painful and less swollen Can bear weight on it now   Tolerated the clindamycin as long as she takes it with food (gets nausea if she does not)  A little loose stool but not bad   Has a small white area in mouth under tongue on the L side  ? If her denture plate rubs there  Dentist looked at in the past and did nor report any abn  Patient Active Problem List   Diagnosis Date Noted   Mouth lesion 09/29/2021   Puncture wound of left foot 09/17/2021   Primary localized osteoarthrosis of multiple sites 08/11/2021   Other long term (current) drug therapy 08/11/2021   Genetic testing 07/07/2021   Squamous cell carcinoma of left lung ([place]) 06/18/2021   Personal history of ovarian cancer 06/15/2021   Family history of melanoma 06/15/2021   Family history of stomach cancer 06/15/2021   Mass of left lung 05/14/2021   CRI (chronic renal insufficiency) 10/23/2020   Left foot pain 01/27/2020   High triglycerides 07/16/2019   COPD (chronic obstructive pulmonary disease) ([place]) 07/12/2018   Elevated TSH 10/18/2016   Elevated serum creatinine 10/18/2016   Estrogen deficiency 03/23/2016   Psoriatic arthritis ([place]) 03/31/2015   Heartburn 03/03/2015   Family history of colon cancer 01/01/2013   Personal history of colonic polyps 01/01/2013   Hearing loss 09/19/2012   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   BACK PAIN WITH RADICULOPATHY 03/15/2007   History of herpes zoster 02/23/2007   Essential hypertension 02/05/2007   Osteopenia  02/05/2007   ECZEMA, ATOPIC 01/29/2007   ROSACEA 01/29/2007   BASAL CELL CARCINOMA, HX OF 01/29/2007   Past Medical History:  Diagnosis Date   Allergy    Anemia    PAST HX    Anginal pain ([place])    Breast cancer ([place])    C1 cervical fracture (H[facility])    placed in c-collar-to f/u with neurosurgery on 06-21-21 in [place] - cancer of ovary    Cancer ([place]) 02/2008   R breast  (est rec neg and brac neg)   Cataract    FORMING    Complication of anesthesia    deviated septum-lost memory for about 1 week after anesthesia   COPD (chronic obstructive pulmonary disease) ([place])    DDD (degenerative disc disease)    in back with chronic pain   Depression    Diverticulosis    Dyspnea    with exertion   Eczema    Family history of colon cancer    GERD (gastroesophageal reflux disease)    Headache    h/o migraines   Hypertension    Mass of left lung    Osteopenia    Rosacea    Zoster 02/2007   Past Surgical History:  Procedure Laterality Date   ABDOMINAL HYSTERECTOMY  1996   BSO fibroids, incidental ovarian CA finding   BASAL CELL CARCINOMA  EXCISION  09/2016   bladder tack     BREAST SURGERY     rt total mastectomy for breast CA   CHOLECYSTECTOMY  08/1996   10 yrs ago   COLONOSCOPY     lipoma removal  11/2005   NASAL SEPTUM SURGERY     POLYPECTOMY     VIDEO BRONCHOSCOPY WITH ENDOBRONCHIAL ULTRASOUND Left 06/16/2021   Procedure: POSSIBLE VIDEO BRONCHOSCOPY WITH ENDOBRONCHIAL ULTRASOUND;  Surgeon: Tyler Pita, MD;  Location: [facility] ORS;  Service: Pulmonary;  Laterality: Left;   Social History   Tobacco Use   Smoking status: Former    Packs/day: 2.00    Years: 10.00    Total pack years: 20.00    Types: Cigarettes    Quit date: 01/03/1990    Years since quitting: 31.7   Smokeless tobacco: Never  Vaping Use   Vaping Use: Never used  Substance Use Topics   Alcohol use: Yes    Alcohol/week: 5.0 standard drinks of alcohol    Types: 5 Glasses of wine per week     Comment: occ glass of wine   Drug use: No   Family History  Problem Relation Age of Onset   Hypertension Mother    Heart disease Mother        CAD   Stomach cancer Mother 80   Lung cancer Father 36   Cancer Sister        colon, skin   Heart disease Sister 87       MI   Diabetes Sister    Colon cancer Sister    Head & neck cancer Sister 66   Lung cancer Sister    Melanoma Sister        multiple on head   Heart disease Brother        HTN and MI   Hypertension Brother    Cancer Maternal Aunt        unk type   Cancer Paternal Aunt        unk type   Cancer Paternal Uncle        unk type   Colon cancer Daughter 93   Esophageal cancer Neg Hx    Colon polyps Neg Hx    Allergies  Allergen Reactions   Sulfa Antibiotics     Pt unsure of reaction   Current Outpatient Medications on File Prior to Visit  Medication Sig Dispense Refill   amLODipine (NORVASC) 10 MG tablet TAKE 1 TABLET BY MOUTH EVERY DAY 90 tablet 1   Calcium Carbonate-Vitamin D (CALTRATE 600+D PO) Take 1,200 mg by mouth every evening.      clindamycin (CLEOCIN) 300 MG capsule Take 1 capsule (300 mg total) by mouth 3 (three) times daily. 30 capsule 0   Cyanocobalamin (B-12 PO) Take 5,000 mg by mouth once a week. (B-12)     Doxylamine Succinate, Sleep, (UNISOM PO) Take 50 mg by mouth at bedtime.     Ferrous Sulfate (IRON) 325 (65 Fe) MG TABS Take 1 tablet by mouth daily.     FLUoxetine (PROZAC) 20 MG capsule TAKE 2 CAPSULES BY MOUTH EVERY DAY 180 capsule 2   Fluticasone-Umeclidin-Vilant (TRELEGY ELLIPTA) 100-62.5-25 MCG/ACT AEPB Inhale 1 puff into the lungs daily. 28 each 11   losartan (COZAAR) 50 MG tablet TAKE 1 TABLET BY MOUTH EVERY DAY (Patient taking differently: Take 50 mg by mouth every morning.) 90 tablet 1   MAGNESIUM PO Take 500 mg by mouth daily.     MELATONIN PO Take  10 mg by mouth at bedtime.     Multiple Vitamin (MULTIVITAMIN) tablet Take 1 tablet by mouth daily.     Multiple Vitamins-Minerals  (PRESERVISION AREDS 2 PO) Take 1 capsule by mouth 2 (two) times daily.     Secukinumab, 300 MG Dose, (COSENTYX, 300 MG DOSE,) 150 MG/ML SOSY Inject 1 application as directed every 30 (thirty) days.     No current facility-administered medications on file prior to visit.     Review of Systems  Constitutional:  Negative for activity change, appetite change, fatigue, fever and unexpected weight change.  HENT:  Positive for mouth sores. Negative for congestion, ear pain, rhinorrhea, sinus pressure and sore throat.   Eyes:  Negative for pain, redness and visual disturbance.  Respiratory:  Negative for cough, shortness of breath and wheezing.   Cardiovascular:  Negative for chest pain and palpitations.  Gastrointestinal:  Negative for abdominal pain, blood in stool, constipation and diarrhea.  Endocrine: Negative for polydipsia and polyuria.  Genitourinary:  Negative for dysuria, frequency and urgency.  Musculoskeletal:  Negative for arthralgias, back pain and myalgias.  Skin:  Positive for wound. Negative for pallor and rash.  Allergic/Immunologic: Negative for environmental allergies.  Neurological:  Negative for dizziness, syncope and headaches.  Hematological:  Negative for adenopathy. Does not bruise/bleed easily.  Psychiatric/Behavioral:  Negative for decreased concentration and dysphoric mood. The patient is not nervous/anxious.        Objective:   Physical Exam Constitutional:      General: She is not in acute distress.    Appearance: Normal appearance. She is well-developed and normal weight. She is not ill-appearing or diaphoretic.  HENT:     Head: Normocephalic and atraumatic.     Mouth/Throat:     Mouth: Mucous membranes are moist.     Comments: Small white area (1-2 mm) under tongue on the left side  No redness or obvious ulcer  Mildly tender No mass or swelling  Healthy appearing mucosa and gum tissue  Eyes:     Conjunctiva/sclera: Conjunctivae normal.     Pupils:  Pupils are equal, round, and reactive to light.  Neck:     Thyroid: No thyromegaly.     Vascular: No carotid bruit or JVD.  Cardiovascular:     Rate and Rhythm: Normal rate and regular rhythm.     Heart sounds: Normal heart sounds.     No gallop.  Pulmonary:     Effort: Pulmonary effort is normal. No respiratory distress.     Breath sounds: Normal breath sounds. No wheezing or rales.  Abdominal:     General: There is no distension or abdominal bruit.     Palpations: Abdomen is soft.  Musculoskeletal:     Cervical back: Normal range of motion and neck supple.     Right lower leg: No edema.     Left lower leg: No edema.     Comments: Able to bear wt on L foot  Swelling is much improved   Lymphadenopathy:     Cervical: No cervical adenopathy.  Skin:    General: Skin is warm and dry.     Coloration: Skin is not pale.     Findings: No rash.     Comments: Wound on dorsal L foot is much improved Scabbed over  No drainage Scant erythema around scab / 2-3 mm  Swelling is improved around wound and in foot   Very mildly tender on top of scar   Neurological:  Mental Status: She is alert.     Coordination: Coordination normal.     Deep Tendon Reflexes: Reflexes are normal and symmetric. Reflexes normal.  Psychiatric:        Mood and Affect: Mood normal.           Assessment & Plan:   Problem List Items Addressed This Visit       Digestive   Mouth lesion    Tiny white lesion in mucosa under tongue on the L side  No swelling  Appears to be area of friction or trauma  Early ulcer if possible  ? If from friction/dental plate Adv to keep out until healed  Salt water swish ambesol otc prn Update if not starting to improve in a week or if worsening  See dentist if worse or not improving         Other   Left foot pain    Improved with clindamycin      Puncture wound of left foot - Primary    Much improved with clindamycin course so far (in setting of mrsa on  culture)  Less redness (almost gone), swelling  Still mildly tender but able to wear shoes and bear weight better Reassuring exam Plan to finish 10 d course then update Will extend time if needed based on how symptoms are at that time  ER precautions noted Will call if any worse pain or redness or swelling or any fever

## 2021-09-29 NOTE — Assessment & Plan Note (Signed)
Tiny white lesion in mucosa under tongue on the L side  No swelling  Appears to be area of friction or trauma  Early ulcer if possible  ? If from friction/dental plate Adv to keep out until healed  Salt water swish ambesol otc prn Update if not starting to improve in a week or if worsening  See dentist if worse or not improving

## 2021-09-29 NOTE — Assessment & Plan Note (Signed)
Much improved with clindamycin course so far (in setting of mrsa on culture)  Less redness (almost gone), swelling  Still mildly tender but able to wear shoes and bear weight better Reassuring exam Plan to finish 10 d course then update Will extend time if needed based on how symptoms are at that time  ER precautions noted Will call if any worse pain or redness or swelling or any fever

## 2021-09-29 NOTE — Assessment & Plan Note (Signed)
Improved with clindamycin

## 2021-10-01 ENCOUNTER — Telehealth: Payer: Self-pay | Admitting: Family Medicine

## 2021-10-01 MED ORDER — CLINDAMYCIN HCL 300 MG PO CAPS: 300.0000 mg | ORAL_CAPSULE | Freq: Three times a day (TID) | ORAL | 0 refills | Status: AC

## 2021-10-01 NOTE — Telephone Encounter (Signed)
Pt wanted to let Dr. Glori Bickers know that she is feeling better. Swelling down but still a little swelling & soreness. Pt wants to know should she continue meds? If so, pt needs meds refilled. For clindamycin (CLEOCIN) 300 MG capsule. Call back # is [phone]

## 2021-10-01 NOTE — Telephone Encounter (Signed)
Pt notified of Dr. Tower's comments and verbalized understanding  

## 2021-10-01 NOTE — Telephone Encounter (Signed)
Please take the clindamycin another 5 days Thanks for the update  Let us know how it is when you finish it

## 2021-10-08 ENCOUNTER — Ambulatory Visit (INDEPENDENT_AMBULATORY_CARE_PROVIDER_SITE_OTHER): Payer: Medicare Other | Admitting: Family Medicine

## 2021-10-08 ENCOUNTER — Ambulatory Visit (INDEPENDENT_AMBULATORY_CARE_PROVIDER_SITE_OTHER)
Admission: RE | Admit: 2021-10-08 | Discharge: 2021-10-08 | Disposition: A | Discharge status: Home / Self Care | Payer: Medicare Other | Source: Ambulatory Visit | Attending: Family Medicine | Admitting: Family Medicine

## 2021-10-08 ENCOUNTER — Encounter: Payer: Self-pay | Admitting: Family Medicine

## 2021-10-08 VITALS — BP 116/64 | HR 59 | Temp 97.4°F | Ht 60.0 in | Wt 133.5 lb

## 2021-10-08 DIAGNOSIS — M79672 Pain in left foot: Secondary | ICD-10-CM | POA: Diagnosis not present

## 2021-10-08 DIAGNOSIS — A4902 Methicillin resistant Staphylococcus aureus infection, unspecified site: Secondary | ICD-10-CM | POA: Insufficient documentation

## 2021-10-08 DIAGNOSIS — M7989 Other specified soft tissue disorders: Secondary | ICD-10-CM | POA: Diagnosis not present

## 2021-10-08 MED ORDER — FAMOTIDINE 20 MG PO TABS: 20.0000 mg | ORAL_TABLET | Freq: Two times a day (BID) | ORAL | 1 refills | Status: DC

## 2021-10-08 NOTE — Patient Instructions (Addendum)
Xray now for your foot  It looks better-reassuring   Plan to follow    Try the generic pepcid for heartburn  Let your specialists know you have it  If not improved let us know  If you recognize a food trigger please avoid it

## 2021-10-08 NOTE — Progress Notes (Signed)
Subjective:    Patient ID: Pamela Morales, female    DOB: [birth year], 81 y.o.   MRN: [id number]  HPI Pt presents for L foot wound/infection   Wt Readings from Last 3 Encounters:  10/08/21 133 lb 8 oz (60.6 kg)  09/29/21 134 lb 2 oz (60.8 kg)  09/23/21 133 lb 8 oz (60.6 kg)   26.07 kg/m  Has had more heartburn since her radiation tx    Last visit noted improvement in wound infection with clindamycin  We extended treatment for 5 more days Prior to that dx with mrsa (wound dx)   Now out of medicine  May have to go to [facility] next week   Improved but not 100% better   Still hurts to walk on Still a little swelling  Redness is better   No pain unless she walks on it  Swelling occurs when she has been up     Review of Systems  Constitutional:  Negative for activity change, appetite change, fatigue, fever and unexpected weight change.  HENT:  Negative for congestion, ear pain, rhinorrhea, sinus pressure and sore throat.   Eyes:  Negative for pain, redness and visual disturbance.  Respiratory:  Negative for cough, shortness of breath and wheezing.   Cardiovascular:  Negative for chest pain and palpitations.  Gastrointestinal:  Negative for abdominal pain, blood in stool, constipation and diarrhea.  Endocrine: Negative for polydipsia and polyuria.  Genitourinary:  Negative for dysuria, frequency and urgency.  Musculoskeletal:  Negative for arthralgias, back pain and myalgias.       Left foot pain  Mild swelling  Skin:  Negative for pallor and rash.  Allergic/Immunologic: Negative for environmental allergies.  Neurological:  Negative for dizziness, syncope and headaches.  Hematological:  Negative for adenopathy. Does not bruise/bleed easily.  Psychiatric/Behavioral:  Negative for decreased concentration and dysphoric mood. The patient is not nervous/anxious.        Objective:   Physical Exam Constitutional:      General: She is not in acute distress.    Appearance:  Normal appearance. She is not ill-appearing.  HENT:     Mouth/Throat:     Mouth: Mucous membranes are moist.  Eyes:     General: No scleral icterus.       Right eye: No discharge.        Left eye: No discharge.     Conjunctiva/sclera: Conjunctivae normal.     Pupils: Pupils are equal, round, and reactive to light.  Cardiovascular:     Rate and Rhythm: Regular rhythm. Bradycardia present.     Heart sounds: Normal heart sounds.  Musculoskeletal:        General: Swelling and signs of injury present.     Comments: Mild L foot swelling  No point tenderness noted (bone or soft tissue) Swelling is most evident medially   Scab/wound area on top of foot looks good, no redness or fluctuant or tenderness  Pt favors RLE for gait Hurts to bear full wt on L foot   Skin:    General: Skin is warm and dry.     Coloration: Skin is not pale.     Findings: No bruising, erythema or rash.  Neurological:     Mental Status: She is alert.     Motor: No weakness.           Assessment & Plan:   Problem List Items Addressed This Visit       Other   Infection of wound  due to methicillin resistant Staphylococcus aureus (MRSA)    Clinically improved and wound is healed after tx with doxycycline and clindamycin  Still some swelling and pain with ambulation  xr ordered       Left foot pain - Primary    Skin is much improved s/p abx tx  Wound is healed Still some swelling in medial foot and pain with ambulation  Re check xr today  inst to watch for return or erythema or any inc in pain or swelling       Relevant Orders   DG Foot Complete Left (Completed)

## 2021-10-10 NOTE — Assessment & Plan Note (Signed)
Clinically improved and wound is healed after tx with doxycycline and clindamycin  Still some swelling and pain with ambulation  xr ordered

## 2021-10-10 NOTE — Assessment & Plan Note (Signed)
Skin is much improved s/p abx tx  Wound is healed Still some swelling in medial foot and pain with ambulation  Re check xr today  inst to watch for return or erythema or any inc in pain or swelling

## 2021-10-11 ENCOUNTER — Other Ambulatory Visit (INDEPENDENT_AMBULATORY_CARE_PROVIDER_SITE_OTHER): Payer: Medicare Other

## 2021-10-11 DIAGNOSIS — M79672 Pain in left foot: Secondary | ICD-10-CM | POA: Diagnosis not present

## 2021-10-11 DIAGNOSIS — A4902 Methicillin resistant Staphylococcus aureus infection, unspecified site: Secondary | ICD-10-CM | POA: Diagnosis not present

## 2021-10-11 LAB — CBC WITH DIFFERENTIAL/PLATELET
Basophils Absolute: 0.1 10*3/uL (ref 0.0–0.1)
Basophils Relative: 0.6 % (ref 0.0–3.0)
Eosinophils Absolute: 0.2 10*3/uL (ref 0.0–0.7)
Eosinophils Relative: 1.7 % (ref 0.0–5.0)
HCT: 35.6 % — ABNORMAL LOW (ref 36.0–46.0)
Hemoglobin: 11.6 g/dL — ABNORMAL LOW (ref 12.0–15.0)
Lymphocytes Relative: 23.4 % (ref 12.0–46.0)
Lymphs Abs: 2.2 10*3/uL (ref 0.7–4.0)
MCHC: 32.7 g/dL (ref 30.0–36.0)
MCV: 81.2 fl (ref 78.0–100.0)
Monocytes Absolute: 0.7 10*3/uL (ref 0.1–1.0)
Monocytes Relative: 7.7 % (ref 3.0–12.0)
Neutro Abs: 6.1 10*3/uL (ref 1.4–7.7)
Neutrophils Relative %: 66.6 % (ref 43.0–77.0)
Platelets: 223 10*3/uL (ref 150.0–400.0)
RBC: 4.39 Mil/uL (ref 3.87–5.11)
RDW: 15 % (ref 11.5–15.5)
WBC: 9.2 10*3/uL (ref 4.0–10.5)

## 2021-10-11 LAB — SEDIMENTATION RATE: Sed Rate: 38 mm/h — ABNORMAL HIGH (ref 0–30)

## 2021-10-11 LAB — C-REACTIVE PROTEIN: CRP: <1 mg/dL (ref 0.5–20.0)

## 2021-10-11 NOTE — Addendum Note (Signed)
Addended by: Loura Pardon A on: 10/11/2021 09:53 AM   Modules accepted: Orders

## 2021-10-12 ENCOUNTER — Telehealth: Payer: Self-pay | Admitting: Family Medicine

## 2021-10-12 DIAGNOSIS — M79672 Pain in left foot: Secondary | ICD-10-CM

## 2021-10-12 NOTE — Telephone Encounter (Signed)
I placed an urgent ortho referral and sent a teams message to [employer]

## 2021-10-12 NOTE — Telephone Encounter (Signed)
-----   Message from Tammi Sou, [place] sent at 10/12/2021  3:53 PM EDT ----- Pt notified of lab results and Dr. Marliss Coots comments. Pt said foot is the exact same, not any better or worse but given sxs she does want to proceed with Ortho referral. She would like to see someone in [place]

## 2021-10-13 NOTE — Telephone Encounter (Signed)
Pt called stating the orthopedic office she was referred to stated they don't have a podiatrist at their office & they seen no need for pt to see a orthopedic for her condition. Pt is asking for advice on further steps? Call back # [phone]

## 2021-10-13 NOTE — Telephone Encounter (Signed)
I changed the referral to podiatry Thanks

## 2021-10-13 NOTE — Telephone Encounter (Signed)
Please advise Dr Glori Bickers next steps for referral.  thanks.

## 2021-10-13 NOTE — Addendum Note (Signed)
Addended by: Loura Pardon A on: 10/13/2021 11:38 AM   Modules accepted: Orders

## 2021-10-14 ENCOUNTER — Encounter (INDEPENDENT_AMBULATORY_CARE_PROVIDER_SITE_OTHER): Payer: Medicare Other | Admitting: Ophthalmology

## 2021-10-14 DIAGNOSIS — H35033 Hypertensive retinopathy, bilateral: Secondary | ICD-10-CM

## 2021-10-14 DIAGNOSIS — I10 Essential (primary) hypertension: Secondary | ICD-10-CM | POA: Diagnosis not present

## 2021-10-14 DIAGNOSIS — H353132 Nonexudative age-related macular degeneration, bilateral, intermediate dry stage: Secondary | ICD-10-CM | POA: Diagnosis not present

## 2021-10-14 DIAGNOSIS — H34812 Central retinal vein occlusion, left eye, with macular edema: Secondary | ICD-10-CM | POA: Diagnosis not present

## 2021-10-14 DIAGNOSIS — H43813 Vitreous degeneration, bilateral: Secondary | ICD-10-CM

## 2021-10-15 NOTE — Telephone Encounter (Signed)
Patient is scheduled with [facility] office on 10/19/2021  Nothing further needed.

## 2021-10-19 ENCOUNTER — Ambulatory Visit: Payer: Medicare Other | Admitting: Podiatry

## 2021-10-19 ENCOUNTER — Ambulatory Visit (INDEPENDENT_AMBULATORY_CARE_PROVIDER_SITE_OTHER): Payer: Medicare Other

## 2021-10-19 DIAGNOSIS — M778 Other enthesopathies, not elsewhere classified: Secondary | ICD-10-CM

## 2021-10-19 NOTE — Progress Notes (Signed)
Chief Complaint  Patient presents with   left injury    Patient is here for left foot injury 4 weeks ago.    HPI: 81 y.o. female presenting today as a new patient for evaluation of a left foot injury that occurred about 4-6 weeks ago.  Patient states that although there has been some improvement and she is able to walk in tennis shoes she has some continued pain and tenderness in the leg would like to have it evaluated.    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    Cancer ([place]) 02/2008   R breast  (est rec neg and brac neg)   Cataract    FORMING    Complication of anesthesia    deviated septum-lost memory for about 1 week after anesthesia   COPD (chronic obstructive pulmonary disease) ([place])    DDD (degenerative disc disease)    in back with chronic pain   Depression    Diverticulosis    Dyspnea    with exertion   Eczema    Family history of colon cancer    GERD (gastroesophageal reflux disease)    Headache    h/o migraines   Hypertension    Mass of left lung    Osteopenia    Rosacea    [place] 02/2007    Past Surgical History:  Procedure Laterality Date   ABDOMINAL HYSTERECTOMY  1996   BSO fibroids, incidental ovarian CA finding   BASAL CELL CARCINOMA EXCISION  09/2016   bladder tack     BREAST SURGERY     rt total mastectomy for breast CA   CHOLECYSTECTOMY  08/1996   10 yrs ago   COLONOSCOPY     lipoma removal  11/2005   NASAL SEPTUM SURGERY     POLYPECTOMY     VIDEO BRONCHOSCOPY WITH ENDOBRONCHIAL ULTRASOUND Left 06/16/2021   Procedure: POSSIBLE VIDEO BRONCHOSCOPY WITH ENDOBRONCHIAL ULTRASOUND;  Surgeon: Tyler Pita, MD;  Location: [facility] ORS;  Service: Pulmonary;  Laterality: Left;    Allergies  Allergen Reactions   Sulfa Antibiotics     Pt unsure  of reaction     Physical Exam: General: The patient is alert and oriented x3 in no acute distress.  Dermatology: Skin is warm, dry and supple bilateral lower extremities. Negative for open lesions or macerations.  Vascular: Palpable pedal pulses bilaterally. Capillary refill within normal limits.  Mild edema noted left foot compared to the contralateral limb  Neurological: Light touch and protective threshold grossly intact  Musculoskeletal Exam: No pedal deformities noted. Diffuse tenderness to palpation along the lateral column of the left foot.  Radiographic Exam LT foot 10/19/2021:  Normal osseous mineralization. Joint spaces preserved. No fracture/dislocation/boney destruction.    Assessment: 1.  Capsulitis left foot   Plan of Care:  1. Patient evaluated. X-Rays reviewed.  2. Overall patient states there has been some improvement, just slow. 3. Continue good tennis shoes and compression. 4. Patient may continue regular activity and explained that the foot should heal with time.  5. Return to clinic PRN      Edrick Kins, DPM [facility]  Dr. Edrick Kins, DPM    [address], [place] [address]                Office [phone]  Fax [phone]

## 2021-11-01 ENCOUNTER — Other Ambulatory Visit: Payer: Self-pay | Admitting: Family Medicine

## 2021-11-06 ENCOUNTER — Other Ambulatory Visit: Payer: Self-pay | Admitting: Family Medicine

## 2021-11-11 ENCOUNTER — Ambulatory Visit
Admission: RE | Admit: 2021-11-11 | Discharge: 2021-11-11 | Disposition: A | Discharge status: Home / Self Care | Payer: Medicare Other | Source: Ambulatory Visit | Attending: Oncology | Admitting: Oncology

## 2021-11-11 DIAGNOSIS — I700 Atherosclerosis of aorta: Secondary | ICD-10-CM | POA: Insufficient documentation

## 2021-11-11 DIAGNOSIS — C3492 Malignant neoplasm of unspecified part of left bronchus or lung: Secondary | ICD-10-CM | POA: Diagnosis not present

## 2021-11-11 DIAGNOSIS — Z85118 Personal history of other malignant neoplasm of bronchus and lung: Secondary | ICD-10-CM | POA: Diagnosis not present

## 2021-11-11 LAB — GLUCOSE, CAPILLARY: Glucose-Capillary: 92 mg/dL (ref 70–99)

## 2021-11-11 MED ORDER — FLUDEOXYGLUCOSE F - 18 (FDG) INJECTION
7.3700 | Freq: Once | INTRAVENOUS | Status: AC | PRN
  Administered 2021-11-11: 7.37 via INTRAVENOUS

## 2021-11-15 ENCOUNTER — Other Ambulatory Visit: Payer: Self-pay | Admitting: *Deleted

## 2021-11-15 ENCOUNTER — Ambulatory Visit: Payer: Medicare Other | Admitting: Pulmonary Disease

## 2021-11-15 ENCOUNTER — Ambulatory Visit
Admission: RE | Admit: 2021-11-15 | Discharge: 2021-11-15 | Disposition: A | Discharge status: Home / Self Care | Payer: Medicare Other | Source: Ambulatory Visit | Attending: Radiation Oncology | Admitting: Radiation Oncology

## 2021-11-15 ENCOUNTER — Encounter: Payer: Self-pay | Admitting: Radiation Oncology

## 2021-11-15 ENCOUNTER — Encounter: Payer: Self-pay | Admitting: Pulmonary Disease

## 2021-11-15 VITALS — BP 130/78 | HR 55 | Ht 60.0 in | Wt 137.6 lb

## 2021-11-15 VITALS — BP 128/68 | HR 60 | Temp 97.0°F | Resp 20 | Ht 60.0 in | Wt 136.4 lb

## 2021-11-15 DIAGNOSIS — Z85118 Personal history of other malignant neoplasm of bronchus and lung: Secondary | ICD-10-CM | POA: Insufficient documentation

## 2021-11-15 DIAGNOSIS — Z87891 Personal history of nicotine dependence: Secondary | ICD-10-CM | POA: Diagnosis not present

## 2021-11-15 DIAGNOSIS — C3492 Malignant neoplasm of unspecified part of left bronchus or lung: Secondary | ICD-10-CM | POA: Diagnosis not present

## 2021-11-15 DIAGNOSIS — J449 Chronic obstructive pulmonary disease, unspecified: Secondary | ICD-10-CM | POA: Diagnosis not present

## 2021-11-15 DIAGNOSIS — Z923 Personal history of irradiation: Secondary | ICD-10-CM | POA: Insufficient documentation

## 2021-11-15 DIAGNOSIS — C3412 Malignant neoplasm of upper lobe, left bronchus or lung: Secondary | ICD-10-CM | POA: Diagnosis not present

## 2021-11-15 NOTE — Progress Notes (Signed)
Radiation Oncology Follow up Note  Name: Pamela Morales   Date:   11/15/2021 MRN:  [id number] DOB: [birth year]    This 81 y.o. 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  REFERRING PROVIDER: Tower, Wynelle Fanny, MD  HPI: Patient is an 81 year old female now out for months having completed SBRT to her left upper lobe for stage Ib (T2 a N0 M0) squamous cell carcinoma.  Seen today in routine follow-up she is doing well.  She specifically denies cough hemoptysis or chest tightness.  Had a recent PET scan showing a left upper lobe lesion consider the decreased in size with minimal FDG uptake less than mediastinal blood pool.  No new or progressive findings were seen.  COMPLICATIONS OF TREATMENT: none  FOLLOW UP COMPLIANCE: keeps appointments   PHYSICAL EXAM:  BP 128/68 (BP Location: Left Arm, Patient Position: Sitting, Cuff Size: Normal)   Pulse 60   Temp (!) 97 F (36.1 C) (Tympanic)   Resp 20   Ht 5' (1.524 m)   Wt 136 lb 6.4 oz (61.9 kg)   BMI 26.64 kg/m  Well-developed well-nourished patient in NAD. HEENT reveals PERLA, EOMI, discs not visualized.  Oral cavity is clear. No oral mucosal lesions are identified. Neck is clear without evidence of cervical or supraclavicular adenopathy. Lungs are clear to A&P. Cardiac examination is essentially unremarkable with regular rate and rhythm without murmur rub or thrill. Abdomen is benign with no organomegaly or masses noted. Motor sensory and DTR levels are equal and symmetric in the upper and lower extremities. Cranial nerves II through XII are grossly intact. Proprioception is intact. No peripheral adenopathy or edema is identified. No motor or sensory levels are noted. Crude visual fields are within normal range.  RADIOLOGY RESULTS: PET CT scan and CT scans reviewed compatible with above-stated findings  PLAN: At this time patient is doing well with no  evidence of disease excellent response to her left upper lobe lesion by PET CT criteria.  And pleased with her overall progress.  I have asked to see her back in 6 months for follow-up with a repeat CT scan of her chest.  Patient is to call with any concerns.  I would like to take this opportunity to thank you for allowing me to participate in the care of your patient.[name], MD

## 2021-11-15 NOTE — Progress Notes (Signed)
Subjective:    Patient ID: Pamela Morales, female    DOB: June 15, 1940, 81 y.o.   MRN: [id number] Patient Care Team: Glori Bickers, Wynelle Fanny, MD as PCP - General (Family Medicine) Jari Pigg, MD as Consulting Physician (Dermatology) Webb Laws, [place] as Consulting Physician (Optometry) Fanny Skates, MD as Consulting Physician (General Surgery) Telford Nab, RN as Oncology Nurse Navigator [name], Kathlene November, MD as Consulting Physician (Oncology) Charlton Haws, Cvp [facility] as Pharmacist (Pharmacist)  Chief Complaint  Patient presents with   Follow-up    SOB with exertion. Some wheezing. Has to keep clearing her throat.   HPI Patient is an 81 year old former smoker who presents for follow-up on the issue of a left upper lobe lesion diagnosed by robotic assisted bronchoscopy on 16 June 2021 squamous cell carcinoma.  Patient has undergone SBRT for the lesion as she declined surgery previously.  She underwent PET/CT on 9 November which shows that she has had excellent response to therapy.    She has moderate COPD by history.  She has been on Trelegy Ellipta but is using it only as needed.  When she uses it she finds she finds that this is very helpful to her.  She notes that she feels her breathing is better when she uses the Trelegy.   Patient has had no fevers, chills or sweats.  No chest pain.  No weight loss or anorexia.  As of breath has been well controlled with Trelegy.  No wheezing.  Overall she feels well and looks well.   Review of Systems A 10 point review of systems was performed and it is as noted above otherwise negative.  Patient Active Problem List   Diagnosis Date Noted   Infection of wound due to methicillin resistant Staphylococcus aureus (MRSA) 10/08/2021   Mouth lesion 09/29/2021   Puncture wound of left foot 09/17/2021   Primary localized osteoarthrosis of multiple sites 08/11/2021   Other long term (current) drug therapy 08/11/2021   Genetic testing 07/07/2021    Squamous cell carcinoma of left lung ([place]) 06/18/2021   Personal history of ovarian cancer 06/15/2021   Family history of melanoma 06/15/2021   Family history of stomach cancer 06/15/2021   Mass of left lung 05/14/2021   CRI (chronic renal insufficiency) 10/23/2020   Left foot pain 01/27/2020   High triglycerides 07/16/2019   COPD (chronic obstructive pulmonary disease) ([place]) 07/12/2018   Elevated TSH 10/18/2016   Elevated serum creatinine 10/18/2016   Estrogen deficiency 03/23/2016   Psoriatic arthritis ([place]) 03/31/2015   Heartburn 03/03/2015   Family history of colon cancer 01/01/2013   Personal history of colonic polyps 01/01/2013   Hearing loss 09/19/2012   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   BACK PAIN WITH RADICULOPATHY 03/15/2007   History of herpes zoster 02/23/2007   Essential hypertension 02/05/2007   Osteopenia 02/05/2007   ECZEMA, ATOPIC 01/29/2007   ROSACEA 01/29/2007   BASAL CELL CARCINOMA, HX OF 01/29/2007   Social History   Tobacco Use   Smoking status: Former    Packs/day: 2.00    Years: 10.00    Total pack years: 20.00    Types: Cigarettes    Quit date: 01/03/1990    Years since quitting: 31.8   Smokeless tobacco: Never  Substance Use Topics   Alcohol use: Yes    Alcohol/week: 5.0 standard drinks of alcohol    Types: 5 Glasses of wine per week    Comment: occ glass of wine   Allergies  Allergen Reactions   Sulfa Antibiotics     Pt unsure of reaction   Current Meds  Medication Sig   amLODipine (NORVASC) 10 MG tablet TAKE 1 TABLET BY MOUTH EVERY DAY   Calcium Carbonate-Vitamin D (CALTRATE 600+D PO) Take 1,200 mg by mouth every evening.    Cyanocobalamin (B-12 PO) Take 5,000 mg by mouth once a week. (B-12)   Doxylamine Succinate, Sleep, (UNISOM PO) Take 50 mg by mouth at bedtime.   famotidine (PEPCID) 20 MG tablet TAKE 1 TABLET BY MOUTH TWICE A DAY   Ferrous Sulfate (IRON) 325 (65 Fe) MG TABS Take 1 tablet by mouth daily.    FLUoxetine (PROZAC) 20 MG capsule TAKE 2 CAPSULES BY MOUTH EVERY DAY   Fluticasone-Umeclidin-Vilant (TRELEGY ELLIPTA) 100-62.5-25 MCG/ACT AEPB Inhale 1 puff into the lungs daily.   losartan (COZAAR) 50 MG tablet TAKE 1 TABLET BY MOUTH EVERY DAY   MAGNESIUM PO Take 500 mg by mouth daily.   MELATONIN PO Take 10 mg by mouth at bedtime.   Multiple Vitamin (MULTIVITAMIN) tablet Take 1 tablet by mouth daily.   Multiple Vitamins-Minerals (PRESERVISION AREDS 2 PO) Take 1 capsule by mouth 2 (two) times daily.   Secukinumab, 300 MG Dose, (COSENTYX, 300 MG DOSE,) 150 MG/ML SOSY Inject 1 application as directed every 30 (thirty) days.   Immunization History  Administered Date(s) Administered   COVID-19, mRNA, vaccine(Comirnaty)12 years and older 10/24/2021   Fluad Quad(high Dose 65+) 01/01/2020   Influenza Split 10/11/2011   Influenza Whole 10/04/2006   Influenza, High Dose Seasonal PF 10/12/2016, 10/30/2017, 10/04/2018, 01/01/2020, 09/28/2021   Influenza,inj,Quad PF,6+ Mos 09/19/2012, 10/31/2013, 10/10/2014   Influenza-Unspecified 09/18/2015   Moderna Sars-Covid-2 Vaccination 01/29/2019, 02/26/2019, 11/23/2019   Pneumococcal Conjugate-13 04/28/2014   Pneumococcal Polysaccharide-23 08/03/2012   Td 01/03/1994, 07/17/2008, 12/10/2018   Zoster Recombinat (Shingrix) 03/25/2021, 08/23/2021      Objective:   Physical Exam BP 130/78 (BP Location: Left Arm, Cuff Size: Normal)   Pulse (!) 55   Ht 5' (1.524 m)   Wt 137 lb 9.6 oz (62.4 kg)   SpO2 97%   BMI 26.87 kg/m  GENERAL: Well-developed well-nourished elderly woman, no acute distress.  Fully ambulatory, no conversational dyspnea.  Quite spry.  Mild hoarseness noted. HEAD: Normocephalic, atraumatic.  EYES: Pupils equal, round, reactive to light.  No scleral icterus.  MOUTH: Oral mucosa moist.  No thrush. NECK: Supple. No thyromegaly. Trachea midline. No JVD.  No adenopathy. PULMONARY: Good air entry bilaterally.  Coarse, otherwise no adventitious  sounds. CARDIOVASCULAR: S1 and S2. Regular rate and rhythm.  No rubs, murmurs or gallops heard. ABDOMEN: Benign. MUSCULOSKELETAL: No joint deformity, no clubbing, no edema.  NEUROLOGIC: No overt focal deficit, no gait disturbance, speech is fluent. SKIN: Intact,warm,dry. PSYCH: Mood and behavior normal.   I reviewed the PET/CT performed 11 November 2021 with the patient, this shows that her left upper lobe lesion has decreased in size considerably with minimal to no FDG uptake compatible with response to therapy no new or progressive findings noted.  Patient had the opportunity to review the films compared to her prior PET/CT.    Assessment & Plan:     ICD-10-CM   1. Stage 1 mild COPD by GOLD classification ([place])  J44.9    Use Trelegy regularly Use albuterol as needed Follow-up 6 months or as needed    2. Squamous cell carcinoma of left lung (HCC)  C34.92    Very good response to SBRT PET/CT reviewed with patient  We recommend that the patient get the RSV vaccine.  This was discussed with the patient.  We will see the patient in follow-up in 6 months time she is to contact us prior to that time should any new difficulties arise.  Renold Don, MD Advanced Bronchoscopy PCCM [facility]    *This note was dictated using voice recognition software/Dragon.  Despite best efforts to proofread, errors can occur which can change the meaning. Any transcriptional errors that result from this process are unintentional and may not be fully corrected at the time of dictation.

## 2021-11-15 NOTE — Patient Instructions (Signed)
Make sure you use your Trelegy every day and rinse your mouth well after you use it.  I recommend that you get the RSV shot.  You can get this at either [facility], [facility] or [employer].  We will see you in follow-up in 6 months time call sooner should any new problems arise.

## 2021-11-16 ENCOUNTER — Inpatient Hospital Stay: Payer: Medicare Other | Attending: Oncology | Admitting: Oncology

## 2021-11-16 ENCOUNTER — Encounter: Payer: Self-pay | Admitting: Oncology

## 2021-11-16 VITALS — BP 142/63 | HR 57 | Temp 98.4°F | Resp 18 | Wt 137.0 lb

## 2021-11-16 DIAGNOSIS — Z8543 Personal history of malignant neoplasm of ovary: Secondary | ICD-10-CM | POA: Diagnosis not present

## 2021-11-16 DIAGNOSIS — Z08 Encounter for follow-up examination after completed treatment for malignant neoplasm: Secondary | ICD-10-CM | POA: Diagnosis not present

## 2021-11-16 DIAGNOSIS — C3492 Malignant neoplasm of unspecified part of left bronchus or lung: Secondary | ICD-10-CM | POA: Diagnosis not present

## 2021-11-16 DIAGNOSIS — Z853 Personal history of malignant neoplasm of breast: Secondary | ICD-10-CM | POA: Insufficient documentation

## 2021-11-16 DIAGNOSIS — Z85118 Personal history of other malignant neoplasm of bronchus and lung: Secondary | ICD-10-CM | POA: Insufficient documentation

## 2021-11-16 DIAGNOSIS — E041 Nontoxic single thyroid nodule: Secondary | ICD-10-CM | POA: Diagnosis not present

## 2021-11-16 DIAGNOSIS — Z85820 Personal history of malignant melanoma of skin: Secondary | ICD-10-CM | POA: Diagnosis not present

## 2021-11-16 NOTE — Progress Notes (Signed)
Here for PET results. Offers no complaints

## 2021-11-16 NOTE — Progress Notes (Signed)
Survivorship Care Plan visit completed.  Treatment summary reviewed and given to patient.  ASCO answers booklet reviewed and given to patient.  CARE program and Cancer Transitions discussed with patient along with other resources cancer center offers to patients and caregivers.  Patient verbalized understanding.    

## 2021-11-16 NOTE — Progress Notes (Signed)
[place]  [phone]) [phone] [phone]  ID: Pamela Morales OB: 03-Mar-1940  MR#: [id number]  MKL#:[id number]  Patient Care Team: Abner Greenspan, MD as PCP - General (Family Medicine) Jari Pigg, MD as Consulting Physician (Dermatology) Webb Laws, [place] as Consulting Physician (Optometry) Fanny Skates, MD as Consulting Physician (General Surgery) Telford Nab, RN as Oncology Nurse Navigator [name], Kathlene November, MD as Consulting Physician (Oncology) Charlton Haws, [facility] as Pharmacist (Pharmacist) Tyler Pita, MD as Consulting Physician (Pulmonary Disease) [name], MD as Consulting Physician (Radiation Oncology)  CHIEF COMPLAINT: 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  INTERVAL HISTORY: Patient returns to clinic today for further evaluation and discussion of her PET scan results.  She currently feels well and is asymptomatic.  She has no neurologic complaints.  She denies any recent fevers or illnesses.  She has a good appetite and denies weight loss.  She has no chest pain, shortness of breath, cough, or hemoptysis.  She denies any nausea, vomiting, constipation, or diarrhea.  She has no urinary complaints.  Patient offers no specific complaints today.  REVIEW OF SYSTEMS:   Review of Systems  Constitutional: Negative.  Negative for fever, malaise/fatigue and weight loss.  Respiratory: Negative.  Negative for cough and shortness of breath.   Cardiovascular: Negative.  Negative for chest pain and leg swelling.  Gastrointestinal: Negative.  Negative for abdominal pain.  Genitourinary: Negative.  Negative for dysuria.  Musculoskeletal: Negative.  Negative for back pain.  Skin: Negative.  Negative for rash.  Neurological: Negative.  Negative for dizziness, focal weakness, weakness and headaches.  Psychiatric/Behavioral: Negative.  The patient is not nervous/anxious.     As per HPI. Otherwise, a complete review of  systems is negative.  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    Cancer ([place]) 02/2008   R breast  (est rec neg and brac neg)   Cataract    FORMING    Complication of anesthesia    deviated septum-lost memory for about 1 week after anesthesia   COPD (chronic obstructive pulmonary disease) ([place])    DDD (degenerative disc disease)    in back with chronic pain   Depression    Diverticulosis    Dyspnea    with exertion   Eczema    Family history of colon cancer    GERD (gastroesophageal reflux disease)    Headache    h/o migraines   Hypertension    Mass of left lung    Osteopenia    Rosacea    Zoster 02/2007    PAST SURGICAL HISTORY: Past Surgical History:  Procedure Laterality Date   ABDOMINAL HYSTERECTOMY  1996   BSO fibroids, incidental ovarian CA finding   BASAL CELL CARCINOMA EXCISION  09/2016   bladder tack     BREAST SURGERY     rt total mastectomy for breast CA   CHOLECYSTECTOMY  08/1996   10 yrs ago   COLONOSCOPY     lipoma removal  11/2005   NASAL SEPTUM SURGERY     POLYPECTOMY     VIDEO BRONCHOSCOPY WITH ENDOBRONCHIAL ULTRASOUND Left 06/16/2021   Procedure: POSSIBLE VIDEO BRONCHOSCOPY WITH ENDOBRONCHIAL ULTRASOUND;  Surgeon: Tyler Pita, MD;  Location: [facility] ORS;  Service: Pulmonary;  Laterality: Left;    FAMILY HISTORY: Family History  Problem Relation Age of Onset   Hypertension Mother    Heart disease Mother        CAD   Stomach cancer Mother 57   Lung cancer Father 56   Cancer Sister        colon, skin   Heart disease Sister 44       MI   Diabetes Sister    Colon cancer Sister    Head & neck cancer Sister 24   Lung cancer Sister    Melanoma Sister        multiple on head   Heart disease Brother        HTN and  MI   Hypertension Brother    Cancer Maternal Aunt        unk type   Cancer Paternal Aunt        unk type   Cancer Paternal Uncle        unk type   Colon cancer Daughter 64   Esophageal cancer Neg Hx    Colon polyps Neg Hx     ADVANCED DIRECTIVES (Y/N):  N  HEALTH MAINTENANCE: Social History   Tobacco Use   Smoking status: Former    Packs/day: 2.00    Years: 10.00    Total pack years: 20.00    Types: Cigarettes    Quit date: 01/03/1990    Years since quitting: 31.8   Smokeless tobacco: Never  Vaping Use   Vaping Use: Never used  Substance Use Topics   Alcohol use: Yes    Alcohol/week: 5.0 standard drinks of alcohol    Types: 5 Glasses of wine per week    Comment: occ glass of wine   Drug use: No     Colonoscopy:  PAP:  Bone density:  Lipid panel:  Allergies  Allergen Reactions   Sulfa Antibiotics     Pt unsure of reaction    Current Outpatient Medications  Medication Sig Dispense Refill   amLODipine (NORVASC) 10 MG tablet TAKE 1 TABLET BY MOUTH EVERY DAY 90 tablet 1   Calcium Carbonate-Vitamin D (CALTRATE 600+D PO) Take 1,200 mg by mouth every evening.      Cyanocobalamin (B-12 PO) Take 5,000 mg by mouth once a week. (B-12)     Doxylamine Succinate, Sleep, (UNISOM PO) Take 50 mg by mouth at bedtime.     famotidine (PEPCID) 20 MG tablet TAKE 1 TABLET BY MOUTH TWICE A DAY 180 tablet 1   Ferrous Sulfate (IRON) 325 (65 Fe) MG TABS Take 1 tablet by mouth daily.     FLUoxetine (PROZAC) 20 MG capsule TAKE 2 CAPSULES BY MOUTH EVERY DAY 180 capsule 2   Fluticasone-Umeclidin-Vilant (TRELEGY ELLIPTA) 100-62.5-25 MCG/ACT AEPB Inhale 1 puff into the lungs daily. 28 each 11   losartan (COZAAR) 50 MG tablet TAKE 1 TABLET BY MOUTH EVERY DAY 90 tablet 1   MAGNESIUM PO Take 500 mg by mouth daily.     MELATONIN PO Take 10 mg by mouth at bedtime.     Multiple Vitamin (MULTIVITAMIN) tablet Take 1 tablet by mouth daily.     Multiple Vitamins-Minerals (PRESERVISION AREDS 2 PO)  Take 1 capsule by mouth 2 (two) times daily.     Secukinumab, 300 MG Dose, (COSENTYX, 300 MG DOSE,) 150 MG/ML SOSY Inject 1 application as directed every 30 (thirty) days.     No  current facility-administered medications for this visit.    OBJECTIVE: Vitals:   11/16/21 1045  BP: (!) 142/63  Pulse: (!) 57  Resp: 18  Temp: 98.4 F (36.9 C)  SpO2: 98%     Body mass index is 26.76 kg/m.    ECOG FS:0 - Asymptomatic  General: Well-developed, well-nourished, no acute distress. Eyes: Pink conjunctiva, anicteric sclera. HEENT: Normocephalic, moist mucous membranes. Lungs: No audible wheezing or coughing. Heart: Regular rate and rhythm. Abdomen: Soft, nontender, no obvious distention. Musculoskeletal: No edema, cyanosis, or clubbing. Neuro: Alert, answering all questions appropriately. Cranial nerves grossly intact. Skin: No rashes or petechiae noted. Psych: Normal affect.  LAB RESULTS:  Lab Results  Component Value Date   NA 137 05/20/2021   K 4.4 05/20/2021   CL 103 05/20/2021   CO2 27 05/20/2021   GLUCOSE 103 (H) 05/20/2021   BUN 24 (H) 05/20/2021   CREATININE 1.37 (H) 05/20/2021   CALCIUM 9.2 05/20/2021   PROT 7.8 05/20/2021   ALBUMIN 3.8 05/20/2021   AST 18 05/20/2021   ALT 15 05/20/2021   ALKPHOS 70 05/20/2021   BILITOT 0.4 05/20/2021   GFRNONAA 39 (L) 05/20/2021   GFRAA (L) 05/07/2008    56        The eGFR has been calculated using the MDRD equation. This calculation has not been validated in all clinical situations. eGFR's persistently <60 mL/min signify possible Chronic Kidney Disease.    Lab Results  Component Value Date   WBC 9.2 10/11/2021   NEUTROABS 6.1 10/11/2021   HGB 11.6 (L) 10/11/2021   HCT 35.6 (L) 10/11/2021   MCV 81.2 10/11/2021   PLT 223.0 10/11/2021     STUDIES: NM PET Image Restag (PS) Skull Base To Thigh  Result Date: 11/13/2021 CLINICAL DATA:  Subsequent treatment strategy for breast cancer, history of LEFT upper lobe mass,  history of squamous cell carcinoma of the LEFT upper lobe. Also with history of ovarian neoplasm. Assess treatment response. EXAM: NUCLEAR MEDICINE PET SKULL BASE TO THIGH TECHNIQUE: 7.37 mCi F-18 FDG was injected intravenously. Full-ring PET imaging was performed from the skull base to thigh after the radiotracer. CT data was obtained and used for attenuation correction and anatomic localization. Fasting blood glucose: 92 mg/dl COMPARISON:  Comparison is made with May 26, 2021. FINDINGS: Mediastinal blood pool activity: SUV max 1.78 Liver activity: SUV max NA NECK: No hypermetabolic lymph nodes in the neck. Incidental CT findings: Stable large LEFT thyroid lesion measuring 3.5 cm greatest axial dimension. CHEST: LEFT upper lobe lesion 1.9 x 1.7 cm, previously 3.2 x 2.5 cm. Maximum SUV in this location currently 1.73 as compared to 20.57. No adenopathy in the chest. No effusion. There is surrounding ground-glass and septal thickening presumably post treatment related. Incidental CT findings: Aortic atherosclerosis. Stable heart size. Signs of RIGHT mastectomy and breast reconstruction. There ways are patent.  No consolidation or effusion. ABDOMEN/PELVIS: No abnormal hypermetabolic activity within the liver, pancreas, adrenal glands, or spleen. No hypermetabolic lymph nodes in the abdomen or pelvis. Incidental CT findings: Post cholecystectomy with aortic atherosclerosis. No signs of aortic aneurysm. LEFT-sided IVC. No adenopathy by size criteria in the abdomen or in the pelvis. No acute findings. SKELETON: No focal hypermetabolic activity to suggest skeletal metastasis. Incidental CT findings: Spinal degenerative changes. Unchanged grade 2 anterolisthesis of L5 on S1. IMPRESSION: 1. LEFT upper lobe lesion decreased in size considerably with only minimal FDG uptake, less than mediastinal blood pool currently. Findings are compatible with response to therapy. 2.  No new or progressive findings. 3. Aortic  atherosclerosis. Electronically Signed   By: Zetta Bills M.D.   On: 11/13/2021 18:30   DG Foot Complete Left  Result Date: 10/19/2021 Please see detailed radiograph report in office note.   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  Biopsy confirmed the results.  Previously surgical resection was discussed, but patient opted to pursue XRT only completing in August 2023.  Repeat PET scan on November 13, 2021 reviewed independently and reported as above with essential resolution of hypermetabolism of the lesion.  No intervention is needed at this time. Will continue to monitor with chest CT every 6 months.  Return to clinic in 6 months with repeat imaging and further evaluation.    History of breast cancer: Greater than 15 years ago.  Patient states she underwent mastectomy, but did not receive any XRT, chemotherapy, or hormonal treatment. History of ovarian cancer: Patient reports this was an incidental on pathology after having a total hysterectomy. History of melanoma: Unclear depth or stage. Right lower lobe pulmonary nodule: PET scan as above. Thyroid nodule: PET scan as above.  Patient will likely need a thyroid ultrasound to further identify. Genetics: Referral has been placed to genetics and results are pending at time of dictation.  I spent a total of 20 minutes reviewing chart data, face-to-face evaluation with the patient, counseling and coordination of care as detailed above.   Patient expressed understanding and was in agreement with this plan. She also understands that She can call clinic at any time with any questions, concerns, or complaints.    Cancer Staging  Squamous cell carcinoma of left lung [facility]) Staging form: Lung, AJCC 8th Edition - Clinical stage from 06/23/2021:  Stage IB (cT2a, cN0, cM0) - Signed by Lloyd Huger, MD on 06/23/2021   Lloyd Huger, MD   11/16/2021 12:55 PM

## 2021-12-16 ENCOUNTER — Encounter (INDEPENDENT_AMBULATORY_CARE_PROVIDER_SITE_OTHER): Payer: Medicare Other | Admitting: Ophthalmology

## 2021-12-16 DIAGNOSIS — H43813 Vitreous degeneration, bilateral: Secondary | ICD-10-CM | POA: Diagnosis not present

## 2021-12-16 DIAGNOSIS — H34812 Central retinal vein occlusion, left eye, with macular edema: Secondary | ICD-10-CM | POA: Diagnosis not present

## 2021-12-16 DIAGNOSIS — H35033 Hypertensive retinopathy, bilateral: Secondary | ICD-10-CM

## 2021-12-16 DIAGNOSIS — H353132 Nonexudative age-related macular degeneration, bilateral, intermediate dry stage: Secondary | ICD-10-CM

## 2021-12-16 DIAGNOSIS — I10 Essential (primary) hypertension: Secondary | ICD-10-CM | POA: Diagnosis not present

## 2022-01-12 ENCOUNTER — Telehealth: Payer: Self-pay | Admitting: Family Medicine

## 2022-01-12 NOTE — Telephone Encounter (Signed)
Pt called asking from our records, has she gotten her rsv shot? Call back # [phone]

## 2022-01-12 NOTE — Telephone Encounter (Signed)
We do not have the RSV vaccine on the pharmacy does. Pt notified we have not given her the RSV vaccine but she can check with her local pharmacy to make sure she didn't get it there. Pt verbalized understanding

## 2022-02-10 ENCOUNTER — Encounter (INDEPENDENT_AMBULATORY_CARE_PROVIDER_SITE_OTHER): Payer: HMO | Admitting: Ophthalmology

## 2022-02-10 DIAGNOSIS — H43813 Vitreous degeneration, bilateral: Secondary | ICD-10-CM

## 2022-02-10 DIAGNOSIS — Z79899 Other long term (current) drug therapy: Secondary | ICD-10-CM | POA: Diagnosis not present

## 2022-02-10 DIAGNOSIS — Z6825 Body mass index (BMI) 25.0-25.9, adult: Secondary | ICD-10-CM | POA: Diagnosis not present

## 2022-02-10 DIAGNOSIS — E663 Overweight: Secondary | ICD-10-CM | POA: Diagnosis not present

## 2022-02-10 DIAGNOSIS — I10 Essential (primary) hypertension: Secondary | ICD-10-CM

## 2022-02-10 DIAGNOSIS — H353132 Nonexudative age-related macular degeneration, bilateral, intermediate dry stage: Secondary | ICD-10-CM | POA: Diagnosis not present

## 2022-02-10 DIAGNOSIS — L4050 Arthropathic psoriasis, unspecified: Secondary | ICD-10-CM | POA: Diagnosis not present

## 2022-02-10 DIAGNOSIS — H34812 Central retinal vein occlusion, left eye, with macular edema: Secondary | ICD-10-CM

## 2022-02-10 DIAGNOSIS — M1991 Primary osteoarthritis, unspecified site: Secondary | ICD-10-CM | POA: Diagnosis not present

## 2022-02-10 DIAGNOSIS — L409 Psoriasis, unspecified: Secondary | ICD-10-CM | POA: Diagnosis not present

## 2022-02-10 DIAGNOSIS — H35033 Hypertensive retinopathy, bilateral: Secondary | ICD-10-CM | POA: Diagnosis not present

## 2022-02-13 ENCOUNTER — Other Ambulatory Visit: Payer: Self-pay | Admitting: Family Medicine

## 2022-02-23 DIAGNOSIS — Z85828 Personal history of other malignant neoplasm of skin: Secondary | ICD-10-CM | POA: Diagnosis not present

## 2022-02-23 DIAGNOSIS — R229 Localized swelling, mass and lump, unspecified: Secondary | ICD-10-CM | POA: Diagnosis not present

## 2022-02-23 DIAGNOSIS — D2262 Melanocytic nevi of left upper limb, including shoulder: Secondary | ICD-10-CM | POA: Diagnosis not present

## 2022-02-23 DIAGNOSIS — L570 Actinic keratosis: Secondary | ICD-10-CM | POA: Diagnosis not present

## 2022-02-23 DIAGNOSIS — D2261 Melanocytic nevi of right upper limb, including shoulder: Secondary | ICD-10-CM | POA: Diagnosis not present

## 2022-02-23 DIAGNOSIS — Z85820 Personal history of malignant melanoma of skin: Secondary | ICD-10-CM | POA: Diagnosis not present

## 2022-02-23 DIAGNOSIS — L821 Other seborrheic keratosis: Secondary | ICD-10-CM | POA: Diagnosis not present

## 2022-02-23 DIAGNOSIS — Z86018 Personal history of other benign neoplasm: Secondary | ICD-10-CM | POA: Diagnosis not present

## 2022-02-23 DIAGNOSIS — L578 Other skin changes due to chronic exposure to nonionizing radiation: Secondary | ICD-10-CM | POA: Diagnosis not present

## 2022-02-23 DIAGNOSIS — D225 Melanocytic nevi of trunk: Secondary | ICD-10-CM | POA: Diagnosis not present

## 2022-02-23 DIAGNOSIS — D2272 Melanocytic nevi of left lower limb, including hip: Secondary | ICD-10-CM | POA: Diagnosis not present

## 2022-02-23 DIAGNOSIS — Z808 Family history of malignant neoplasm of other organs or systems: Secondary | ICD-10-CM | POA: Diagnosis not present

## 2022-04-06 ENCOUNTER — Encounter: Payer: Self-pay | Admitting: Family Medicine

## 2022-04-06 ENCOUNTER — Ambulatory Visit (INDEPENDENT_AMBULATORY_CARE_PROVIDER_SITE_OTHER)
Admission: RE | Admit: 2022-04-06 | Discharge: 2022-04-06 | Disposition: A | Discharge status: Home / Self Care | Payer: HMO | Source: Ambulatory Visit | Attending: Family Medicine | Admitting: Family Medicine

## 2022-04-06 ENCOUNTER — Ambulatory Visit (INDEPENDENT_AMBULATORY_CARE_PROVIDER_SITE_OTHER): Payer: HMO | Admitting: Family Medicine

## 2022-04-06 VITALS — BP 112/66 | HR 51 | Temp 97.8°F | Ht 60.0 in | Wt 138.4 lb

## 2022-04-06 DIAGNOSIS — N2889 Other specified disorders of kidney and ureter: Secondary | ICD-10-CM

## 2022-04-06 DIAGNOSIS — R5382 Chronic fatigue, unspecified: Secondary | ICD-10-CM | POA: Diagnosis not present

## 2022-04-06 DIAGNOSIS — R0789 Other chest pain: Secondary | ICD-10-CM

## 2022-04-06 DIAGNOSIS — R0781 Pleurodynia: Secondary | ICD-10-CM | POA: Diagnosis not present

## 2022-04-06 DIAGNOSIS — R7989 Other specified abnormal findings of blood chemistry: Secondary | ICD-10-CM

## 2022-04-06 DIAGNOSIS — M898X1 Other specified disorders of bone, shoulder: Secondary | ICD-10-CM

## 2022-04-06 DIAGNOSIS — N1832 Chronic kidney disease, stage 3b: Secondary | ICD-10-CM

## 2022-04-06 DIAGNOSIS — D171 Benign lipomatous neoplasm of skin and subcutaneous tissue of trunk: Secondary | ICD-10-CM | POA: Diagnosis not present

## 2022-04-06 DIAGNOSIS — K432 Incisional hernia 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 gangrene: Secondary | ICD-10-CM | POA: Insufficient documentation

## 2022-04-06 DIAGNOSIS — K439 Ventral hernia 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 gangrene: Secondary | ICD-10-CM | POA: Insufficient documentation

## 2022-04-06 DIAGNOSIS — D179 Benign lipomatous neoplasm, unspecified: Secondary | ICD-10-CM | POA: Insufficient documentation

## 2022-04-06 LAB — COMPREHENSIVE METABOLIC PANEL
ALT: 20 U/L (ref 0–35)
AST: 25 U/L (ref 0–37)
Albumin: 4 g/dL (ref 3.5–5.2)
Alkaline Phosphatase: 91 U/L (ref 39–117)
BUN: 23 mg/dL (ref 6–23)
CO2: 30 mEq/L (ref 19–32)
Calcium: 9.1 mg/dL (ref 8.4–10.5)
Chloride: 103 mEq/L (ref 96–112)
Creatinine, Ser: 1.19 mg/dL (ref 0.40–1.20)
GFR: 42.45 mL/min — ABNORMAL LOW (ref >60.00)
Glucose, Bld: 87 mg/dL (ref 70–99)
Potassium: 4.5 mEq/L (ref 3.5–5.1)
Sodium: 139 mEq/L (ref 135–145)
Total Bilirubin: 0.3 mg/dL (ref 0.2–1.2)
Total Protein: 6.9 g/dL (ref 6.0–8.3)

## 2022-04-06 LAB — CBC WITH DIFFERENTIAL/PLATELET
Basophils Absolute: 0 10*3/uL (ref 0.0–0.1)
Basophils Relative: 0.6 % (ref 0.0–3.0)
Eosinophils Absolute: 0.1 10*3/uL (ref 0.0–0.7)
Eosinophils Relative: 1.1 % (ref 0.0–5.0)
HCT: 33.7 % — ABNORMAL LOW (ref 36.0–46.0)
Hemoglobin: 11.2 g/dL — ABNORMAL LOW (ref 12.0–15.0)
Lymphocytes Relative: 23.8 % (ref 12.0–46.0)
Lymphs Abs: 1.8 10*3/uL (ref 0.7–4.0)
MCHC: 33.2 g/dL (ref 30.0–36.0)
MCV: 83.1 fl (ref 78.0–100.0)
Monocytes Absolute: 0.5 10*3/uL (ref 0.1–1.0)
Monocytes Relative: 6.7 % (ref 3.0–12.0)
Neutro Abs: 5.2 10*3/uL (ref 1.4–7.7)
Neutrophils Relative %: 67.8 % (ref 43.0–77.0)
Platelets: 290 10*3/uL (ref 150.0–400.0)
RBC: 4.06 Mil/uL (ref 3.87–5.11)
RDW: 13.8 % (ref 11.5–15.5)
WBC: 7.7 10*3/uL (ref 4.0–10.5)

## 2022-04-06 LAB — VITAMIN D 25 HYDROXY (VIT D DEFICIENCY, FRACTURES): VITD: 27.11 ng/mL — ABNORMAL LOW (ref 30.00–100.00)

## 2022-04-06 LAB — IRON: Iron: 75 ug/dL (ref 42–145)

## 2022-04-06 LAB — T4, FREE: Free T4: 0.74 ng/dL (ref 0.60–1.60)

## 2022-04-06 LAB — TSH: TSH: 3.09 u[IU]/mL (ref 0.35–5.50)

## 2022-04-06 LAB — FERRITIN: Ferritin: 370.4 ng/mL — ABNORMAL HIGH (ref 10.0–291.0)

## 2022-04-06 LAB — VITAMIN B12: Vitamin B-12: >1500 pg/mL — ABNORMAL HIGH (ref 211–911)

## 2022-04-06 NOTE — Patient Instructions (Signed)
Use some heat on the painful area  Try the rhomboid stretch that we did in the office You can also massage around the shoulder blade where you hurt Tylenol is ok   Xray of ribs/chest today   We will contact you with results   Let us know if/when you want to see a surgeon for the lipoma (fatty lump) on your abdomen

## 2022-04-06 NOTE — Assessment & Plan Note (Signed)
Suspect rhomboid strain She did react to the stretch for this area  Cxr/rib fils reassuring  Update if not starting to improve in a week or if worsening  after heat and stretches

## 2022-04-06 NOTE — Assessment & Plan Note (Signed)
May be multifactorial Unsure of mood   Lab today

## 2022-04-06 NOTE — Assessment & Plan Note (Signed)
With tenderness Was pleuritic-now improved   CXR with L rib film is reassuring  Suspect strain  Adv use of heat  Stretches for thomboid area Update if not starting to improve in a week or if worsening

## 2022-04-06 NOTE — Assessment & Plan Note (Signed)
Fatigue today  TSH and FT4 ordered

## 2022-04-06 NOTE — Assessment & Plan Note (Signed)
Upper mid abd  Will call when she is ready to see surgeon

## 2022-04-06 NOTE — Progress Notes (Signed)
Subjective:    Patient ID: Pamela Morales, female    DOB: 1940/02/23, 82 y.o.   MRN: [id number]  HPI Pt presents with L chest wall pain   Feeling blah-on and off  More down also   Wt Readings from Last 3 Encounters:  04/06/22 138 lb 6 oz (62.8 kg)  11/16/21 137 lb (62.1 kg)  11/15/21 136 lb 6.4 oz (61.9 kg)   27.02 kg/m  Vitals:   04/06/22 1123  BP: 112/66  Pulse: (!) 51  Temp: 97.8 F (36.6 C)  SpO2: 98%    Some pain in shoulder blade area on L  Some tenderness over anterior /upper ribs on the L also  Hurt to sneeze and cough and take deep breath   Some tylenol helped very briefly   Not really coughing  Just sneezing from allergies   No trauma  Works out regularly - ? If she strained something working out   No rash  Had the shingles vaccine    Feels tired more than usual Blah days on and off/ just not like her   Prior was going to the gym every day      H/o lung cancer in past left  Has next CT scan in may (every 6 months)  H/o psoratic arthritis-on cosentyx   The shoulder itself still has limited rom but not acutely flared  Was here for L shoulder pain about a year ago Had fallen Was px prednisone  Shoulder xray from 04/2021 CLINICAL DATA:  Left shoulder pain after fall   EXAM: LEFT SHOULDER - 2+ VIEW   COMPARISON:  06/18/2020   FINDINGS: Bones are osteopenic. Minor AC joint degenerative change with bony spurring and joint space loss. No acute left shoulder malalignment, fracture or acute osseous finding. Included left chest demonstrates aortic atherosclerosis. Degenerative changes noted throughout the spine.   IMPRESSION: No acute abnormality.  Degenerative changes and osteopenia.    Cxr/rib today DG Ribs Unilateral W/Chest Left  Result Date: 04/06/2022 CLINICAL DATA:  LEFT anterior rib pain, history of breast cancer and lung cancer EXAM: LEFT RIBS AND CHEST - 3+ VIEW COMPARISON:  Chest radiograph 06/16/2021 FINDINGS: Normal heart  size, mediastinal contours, and pulmonary vascularity. Atherosclerotic calcification aorta. Minimal atelectasis or scarring LEFT apex, with resolution of previously identified LEFT upper lobe opacity. Chronic peribronchial thickening. No pulmonary infiltrate, pleural effusion, or pneumothorax. Bones demineralized. No rib fracture or bone destruction seen. IMPRESSION: Chronic bronchitic changes without acute infiltrate. Previously identified LEFT apex opacity no longer seen. No definite acute LEFT rib abnormalities. Aortic Atherosclerosis (ICD10-I70.0). Electronically Signed   By: Lavonia Dana M.D.   On: 04/06/2022 12:30    Patient Active Problem List   Diagnosis Date Noted   Lipoma 04/06/2022   Left-sided chest wall pain 04/06/2022   Pain of left scapula 04/06/2022   Infection of wound due to methicillin resistant Staphylococcus aureus (MRSA) 10/08/2021   Mouth lesion 09/29/2021   Puncture wound of left foot 09/17/2021   Primary localized osteoarthrosis of multiple sites 08/11/2021   Other long term (current) drug therapy 08/11/2021   Genetic testing 07/07/2021   Squamous cell carcinoma of left lung 06/18/2021   Personal history of ovarian cancer 06/15/2021   Family history of melanoma 06/15/2021   Family history of stomach cancer 06/15/2021   Mass of left lung 05/14/2021   CRI (chronic renal insufficiency) 10/23/2020   Left foot pain 01/27/2020   High triglycerides 07/16/2019   COPD (chronic obstructive pulmonary disease) 07/12/2018  Elevated TSH 10/18/2016   Elevated serum creatinine 10/18/2016   Estrogen deficiency 03/23/2016   Psoriatic arthritis 03/31/2015   Heartburn 03/03/2015   Fatigue 04/28/2014   Family history of colon cancer 01/01/2013   Personal history of colonic polyps 01/01/2013   Hearing loss 09/19/2012   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   BACK PAIN WITH RADICULOPATHY 03/15/2007   History of herpes zoster 02/23/2007   Essential hypertension  02/05/2007   Osteopenia 02/05/2007   ECZEMA, ATOPIC 01/29/2007   ROSACEA 01/29/2007   BASAL CELL CARCINOMA, HX OF 01/29/2007   Past Medical History:  Diagnosis Date   Allergy    Anemia    PAST HX    Anginal pain    Breast cancer    C1 cervical fracture    placed in c-collar-to f/u with neurosurgery on 06-21-21 in [place] - cancer of ovary    Cancer 02/2008   R breast  (est rec neg and brac neg)   Cataract    FORMING    Complication of anesthesia    deviated septum-lost memory for about 1 week after anesthesia   COPD (chronic obstructive pulmonary disease)    DDD (degenerative disc disease)    in back with chronic pain   Depression    Diverticulosis    Dyspnea    with exertion   Eczema    Family history of colon cancer    GERD (gastroesophageal reflux disease)    Headache    h/o migraines   Hypertension    Mass of left lung    Osteopenia    Rosacea    Zoster 02/2007   Past Surgical History:  Procedure Laterality Date   ABDOMINAL HYSTERECTOMY  1996   BSO fibroids, incidental ovarian CA finding   BASAL CELL CARCINOMA EXCISION  09/2016   bladder tack     BREAST SURGERY     rt total mastectomy for breast CA   CHOLECYSTECTOMY  08/1996   10 yrs ago   COLONOSCOPY     lipoma removal  11/2005   NASAL SEPTUM SURGERY     POLYPECTOMY     VIDEO BRONCHOSCOPY WITH ENDOBRONCHIAL ULTRASOUND Left 06/16/2021   Procedure: POSSIBLE VIDEO BRONCHOSCOPY WITH ENDOBRONCHIAL ULTRASOUND;  Surgeon: Tyler Pita, MD;  Location: [facility] ORS;  Service: Pulmonary;  Laterality: Left;   Social History   Tobacco Use   Smoking status: Former    Packs/day: 2.00    Years: 10.00    Additional pack years: 0.00    Total pack years: 20.00    Types: Cigarettes    Quit date: 01/03/1990    Years since quitting: 32.2   Smokeless tobacco: Never  Vaping Use   Vaping Use: Never used  Substance Use Topics   Alcohol use: Yes    Alcohol/week: 5.0 standard drinks of alcohol    Types: 5  Glasses of wine per week    Comment: occ glass of wine   Drug use: No   Family History  Problem Relation Age of Onset   Hypertension Mother    Heart disease Mother        CAD   Stomach cancer Mother 64   Lung cancer Father 22   Cancer Sister        colon, skin   Heart disease Sister 53       MI   Diabetes Sister    Colon cancer Sister    Head & neck cancer Sister 17  Lung cancer Sister    Melanoma Sister        multiple on head   Heart disease Brother        HTN and MI   Hypertension Brother    Cancer Maternal Aunt        unk type   Cancer Paternal Aunt        unk type   Cancer Paternal Uncle        unk type   Colon cancer Daughter 44   Esophageal cancer Neg Hx    Colon polyps Neg Hx    Allergies  Allergen Reactions   Sulfa Antibiotics     Pt unsure of reaction   Current Outpatient Medications on File Prior to Visit  Medication Sig Dispense Refill   amLODipine (NORVASC) 10 MG tablet TAKE 1 TABLET BY MOUTH EVERY DAY 90 tablet 1   Calcium Carbonate-Vitamin D (CALTRATE 600+D PO) Take 1,200 mg by mouth every evening.      Cyanocobalamin (B-12 PO) Take 5,000 mg by mouth once a week. (B-12)     Doxylamine Succinate, Sleep, (UNISOM PO) Take 50 mg by mouth at bedtime.     famotidine (PEPCID) 20 MG tablet TAKE 1 TABLET BY MOUTH TWICE A DAY 180 tablet 1   Ferrous Sulfate (IRON) 325 (65 Fe) MG TABS Take 1 tablet by mouth daily.     FLUoxetine (PROZAC) 20 MG capsule TAKE 2 CAPSULES BY MOUTH EVERY DAY 180 capsule 2   Fluticasone-Umeclidin-Vilant (TRELEGY ELLIPTA) 100-62.5-25 MCG/ACT AEPB Inhale 1 puff into the lungs daily. 28 each 11   losartan (COZAAR) 50 MG tablet TAKE 1 TABLET BY MOUTH EVERY DAY 90 tablet 1   MAGNESIUM PO Take 500 mg by mouth daily.     MELATONIN PO Take 10 mg by mouth at bedtime.     Multiple Vitamin (MULTIVITAMIN) tablet Take 1 tablet by mouth daily.     Multiple Vitamins-Minerals (PRESERVISION AREDS 2 PO) Take 1 capsule by mouth 2 (two) times daily.      Secukinumab, 300 MG Dose, (COSENTYX, 300 MG DOSE,) 150 MG/ML SOSY Inject 1 application as directed every 30 (thirty) days.     No current facility-administered medications on file prior to visit.    Review of Systems  Constitutional:  Positive for fatigue. Negative for activity change, appetite change, fever and unexpected weight change.  HENT:  Negative for congestion, ear pain, rhinorrhea, sinus pressure and sore throat.   Eyes:  Negative for pain, redness and visual disturbance.  Respiratory:  Negative for cough, shortness of breath and wheezing.   Cardiovascular:  Negative for chest pain and palpitations.  Gastrointestinal:  Negative for abdominal pain, blood in stool, constipation and diarrhea.  Endocrine: Negative for polydipsia and polyuria.  Genitourinary:  Negative for dysuria, frequency and urgency.  Musculoskeletal:  Negative for arthralgias, back pain and myalgias.       Chest wall pain   Skin:  Negative for pallor and rash.  Allergic/Immunologic: Negative for environmental allergies.  Neurological:  Negative for dizziness, syncope and headaches.  Hematological:  Negative for adenopathy. Does not bruise/bleed easily.  Psychiatric/Behavioral:  Positive for dysphoric mood. Negative for decreased concentration. The patient is not nervous/anxious.        Feels blah       Objective:   Physical Exam Constitutional:      General: She is not in acute distress.    Appearance: Normal appearance. She is well-developed and normal weight. She is not ill-appearing or diaphoretic.  HENT:     Head: Normocephalic and atraumatic.     Mouth/Throat:     Mouth: Mucous membranes are moist.  Eyes:     General: No scleral icterus.       Right eye: No discharge.        Left eye: No discharge.     Conjunctiva/sclera: Conjunctivae normal.     Pupils: Pupils are equal, round, and reactive to light.  Neck:     Thyroid: No thyromegaly.     Vascular: No carotid bruit or JVD.   Cardiovascular:     Rate and Rhythm: Regular rhythm. Bradycardia present.     Heart sounds: Normal heart sounds.     No gallop.  Pulmonary:     Effort: Pulmonary effort is normal. No respiratory distress.     Breath sounds: Normal breath sounds. No stridor. No wheezing, rhonchi or rales.     Comments: Diffusely distant bs  No crackles or wheeze  Some tenderness in L upper ant ribs Also over rhomboid area of L back near scapula  No crepitus  No skin changes  Chest:     Chest wall: No tenderness.  Abdominal:     General: There is no distension or abdominal bruit.     Palpations: Abdomen is soft.     Tenderness: There is no abdominal tenderness. There is no guarding or rebound.     Hernia: A hernia is present.     Comments: Mod to large lipoma over R mid abdomen Nt  Soft but not fluctuant   Musculoskeletal:     Cervical back: Normal range of motion and neck supple.     Right lower leg: No edema.     Left lower leg: No edema.  Lymphadenopathy:     Cervical: No cervical adenopathy.  Skin:    General: Skin is warm and dry.     Coloration: Skin is not pale.     Findings: No rash.  Neurological:     Mental Status: She is alert.     Cranial Nerves: No cranial nerve deficit.     Motor: No weakness.     Coordination: Coordination normal.     Deep Tendon Reflexes: Reflexes are normal and symmetric. Reflexes normal.  Psychiatric:        Mood and Affect: Mood normal.        Cognition and Memory: Cognition and memory normal.     Comments: Seems more generally tired than depressed  Candidly discusses symptoms and stressors             Assessment & Plan:   Problem List Items Addressed This Visit       Genitourinary   CRI (chronic renal insufficiency)   Relevant Orders   Comprehensive metabolic panel   VITAMIN D 25 Hydroxy (Vit-D Deficiency, Fractures)   Vitamin B12     Other   Elevated TSH    Fatigue today  TSH and FT4 ordered      Relevant Orders   TSH    T4, free   Fatigue    May be multifactorial Unsure of mood   Lab today      Relevant Orders   Comprehensive metabolic panel   CBC with Differential/Platelet   TSH   Vitamin B12   T4, free   Ferritin   Iron   Left-sided chest wall pain    With tenderness Was pleuritic-now improved   CXR with L rib film is reassuring  Suspect strain  Adv  use of heat  Stretches for thomboid area Update if not starting to improve in a week or if worsening        Relevant Orders   DG Ribs Unilateral W/Chest Left (Completed)   Lipoma - Primary    Upper mid abd  Will call when she is ready to see surgeon        Pain of left scapula    Suspect rhomboid strain She did react to the stretch for this area  Cxr/rib fils reassuring  Update if not starting to improve in a week or if worsening  after heat and stretches

## 2022-04-07 ENCOUNTER — Encounter (INDEPENDENT_AMBULATORY_CARE_PROVIDER_SITE_OTHER): Payer: HMO | Admitting: Ophthalmology

## 2022-04-07 ENCOUNTER — Encounter: Payer: Self-pay | Admitting: Family Medicine

## 2022-04-07 DIAGNOSIS — H35033 Hypertensive retinopathy, bilateral: Secondary | ICD-10-CM | POA: Diagnosis not present

## 2022-04-07 DIAGNOSIS — I10 Essential (primary) hypertension: Secondary | ICD-10-CM | POA: Diagnosis not present

## 2022-04-07 DIAGNOSIS — H43813 Vitreous degeneration, bilateral: Secondary | ICD-10-CM | POA: Diagnosis not present

## 2022-04-07 DIAGNOSIS — H353132 Nonexudative age-related macular degeneration, bilateral, intermediate dry stage: Secondary | ICD-10-CM | POA: Diagnosis not present

## 2022-04-07 DIAGNOSIS — H34812 Central retinal vein occlusion, left eye, with macular edema: Secondary | ICD-10-CM

## 2022-04-08 MED ORDER — FLUOXETINE HCL 20 MG PO CAPS: 60.0000 mg | ORAL_CAPSULE | Freq: Every day | ORAL | 1 refills | Status: DC

## 2022-04-11 ENCOUNTER — Telehealth: Payer: Self-pay | Admitting: *Deleted

## 2022-04-11 NOTE — Telephone Encounter (Signed)
Patient called in returning call, she stated she will be available.

## 2022-04-11 NOTE — Telephone Encounter (Signed)
Left VM requesting pt to call the office back 

## 2022-04-11 NOTE — Telephone Encounter (Signed)
-----   Message from Judy Pimple, MD sent at 04/10/2022  4:53 PM EDT ----- She takes 25 mcg vit D daily (is in the ca pill) Please get vitamin D3 over the counter and take an additional 2000 iu daily

## 2022-04-11 NOTE — Telephone Encounter (Signed)
Pt.notified

## 2022-04-30 ENCOUNTER — Other Ambulatory Visit: Payer: Self-pay | Admitting: Family Medicine

## 2022-05-05 ENCOUNTER — Other Ambulatory Visit: Payer: Self-pay | Admitting: Family Medicine

## 2022-05-11 ENCOUNTER — Ambulatory Visit
Admission: RE | Admit: 2022-05-11 | Discharge: 2022-05-11 | Disposition: A | Discharge status: Home / Self Care | Payer: HMO | Source: Ambulatory Visit | Attending: Radiation Oncology | Admitting: Radiation Oncology

## 2022-05-11 DIAGNOSIS — C3492 Malignant neoplasm of unspecified part of left bronchus or lung: Secondary | ICD-10-CM | POA: Diagnosis not present

## 2022-05-11 DIAGNOSIS — S2242XA Multiple fractures of ribs, left side, initial encounter for closed fracture: Secondary | ICD-10-CM | POA: Diagnosis not present

## 2022-05-11 MED ORDER — IOHEXOL 300 MG/ML  SOLN
60.0000 mL | Freq: Once | INTRAMUSCULAR | Status: AC | PRN
  Administered 2022-05-11: 60 mL via INTRAVENOUS

## 2022-05-13 ENCOUNTER — Other Ambulatory Visit: Payer: Self-pay | Admitting: Family Medicine

## 2022-05-16 ENCOUNTER — Ambulatory Visit
Admission: RE | Admit: 2022-05-16 | Discharge: 2022-05-16 | Disposition: A | Discharge status: Home / Self Care | Payer: HMO | Source: Ambulatory Visit | Attending: Radiation Oncology | Admitting: Radiation Oncology

## 2022-05-16 VITALS — BP 131/64 | HR 58 | Temp 97.8°F | Resp 17 | Wt 135.0 lb

## 2022-05-16 DIAGNOSIS — Z923 Personal history of irradiation: Secondary | ICD-10-CM | POA: Diagnosis not present

## 2022-05-16 DIAGNOSIS — R0789 Other chest pain: Secondary | ICD-10-CM | POA: Insufficient documentation

## 2022-05-16 DIAGNOSIS — C3492 Malignant neoplasm of unspecified part of left bronchus or lung: Secondary | ICD-10-CM

## 2022-05-16 DIAGNOSIS — Z85118 Personal history of other malignant neoplasm of bronchus and lung: Secondary | ICD-10-CM | POA: Diagnosis not present

## 2022-05-16 DIAGNOSIS — Z87891 Personal history of nicotine dependence: Secondary | ICD-10-CM | POA: Diagnosis not present

## 2022-05-16 DIAGNOSIS — C3412 Malignant neoplasm of upper lobe, left bronchus or lung: Secondary | ICD-10-CM | POA: Diagnosis not present

## 2022-05-16 NOTE — Progress Notes (Signed)
Radiation Oncology Follow up Note  Name: Pamela Morales   Date:   05/16/2022 MRN:  [id number] DOB: 05-17-1940    This 83 y.o. female presents to the clinic today for 43-month follow-up status post SBRT to the left upper lobe for squamous cell carcinoma stage Ib.  REFERRING PROVIDER: Tower, Audrie Gallus, MD  HPI: Patient is a 82 year old female now out 10 months having completed SBRT to her left upper lobe for stage Ib squamous cell carcinoma.  Seen today in routine follow-up she is doing well.  She specifically denies cough hemoptysis or chest tightness..  She had a recent CT scan showing radiation changes in the left upper lobe consistent with treatment.  She also had improving left upper lobe nodule favoring treated neoplasm.  She also has a healing left lateral third rib fracture.  She is complaining of some left-sided chest pain most likely associated with that.  COMPLICATIONS OF TREATMENT: none  FOLLOW UP COMPLIANCE: keeps appointments   PHYSICAL EXAM:  BP 131/64 (Patient Position: Sitting)   Pulse (!) 58   Temp 97.8 F (36.6 C) (Tympanic)   Resp 17   Wt 135 lb (61.2 kg)   SpO2 100%   BMI 26.37 kg/m  Well-developed well-nourished patient in NAD. HEENT reveals PERLA, EOMI, discs not visualized.  Oral cavity is clear. No oral mucosal lesions are identified. Neck is clear without evidence of cervical or supraclavicular adenopathy. Lungs are clear to A&P. Cardiac examination is essentially unremarkable with regular rate and rhythm without murmur rub or thrill. Abdomen is benign with no organomegaly or masses noted. Motor sensory and DTR levels are equal and symmetric in the upper and lower extremities. Cranial nerves II through XII are grossly intact. Proprioception is intact. No peripheral adenopathy or edema is identified. No motor or sensory levels are noted. Crude visual fields are within normal range.  RADIOLOGY RESULTS: CT scan reviewed compatible with above-stated findings  PLAN:  Present time patient is doing well with stable CT scan at this time.  She is asymptomatic.  I have asked to see her back in 6 months for follow-up with repeat CT scan at that time.  Patient is to call with any concerns.  I would like to take this opportunity to thank you for allowing me to participate in the care of your patient.Carmina Miller, MD

## 2022-05-26 ENCOUNTER — Inpatient Hospital Stay: Payer: HMO | Attending: Oncology | Admitting: Oncology

## 2022-05-26 ENCOUNTER — Encounter: Payer: Self-pay | Admitting: Oncology

## 2022-05-26 VITALS — BP 146/73 | HR 57 | Temp 98.4°F | Resp 18 | Ht 60.0 in | Wt 135.6 lb

## 2022-05-26 DIAGNOSIS — Z90710 Acquired absence of both cervix and uterus: Secondary | ICD-10-CM | POA: Insufficient documentation

## 2022-05-26 DIAGNOSIS — Z853 Personal history of malignant neoplasm of breast: Secondary | ICD-10-CM | POA: Insufficient documentation

## 2022-05-26 DIAGNOSIS — Z85820 Personal history of malignant melanoma of skin: Secondary | ICD-10-CM | POA: Diagnosis not present

## 2022-05-26 DIAGNOSIS — Z8543 Personal history of malignant neoplasm of ovary: Secondary | ICD-10-CM | POA: Insufficient documentation

## 2022-05-26 DIAGNOSIS — C3412 Malignant neoplasm of upper lobe, left bronchus or lung: Secondary | ICD-10-CM | POA: Insufficient documentation

## 2022-05-26 DIAGNOSIS — E041 Nontoxic single thyroid nodule: Secondary | ICD-10-CM | POA: Insufficient documentation

## 2022-05-26 DIAGNOSIS — C3492 Malignant neoplasm of unspecified part of left bronchus or lung: Secondary | ICD-10-CM

## 2022-05-26 DIAGNOSIS — Z08 Encounter for follow-up examination after completed treatment for malignant neoplasm: Secondary | ICD-10-CM | POA: Insufficient documentation

## 2022-05-26 MED ORDER — PREDNISONE 10 MG (21) PO TBPK: ORAL_TABLET | ORAL | 0 refills | Status: DC

## 2022-05-26 NOTE — Progress Notes (Signed)
Chest discomfort in left side of chest that goes to her back I the same area. Has been going on for the last couple of months. Mentioned a fracture that she read on her CT scan. The pain comes and goes.

## 2022-05-26 NOTE — Progress Notes (Signed)
[facility]  Telephone:([phone]) [phone] Fax:(336) [phone]  ID: Liz Beach OB: 1941/01/03  MR#: [id number]  NUU#:[id number]  Patient Care Team: Judy Pimple, MD as PCP - General (Family Medicine) Elmon Else, MD as Consulting Physician (Dermatology) Glenford Peers, OD as Consulting Physician (Optometry) Claud Kelp, MD as Consulting Physician (General Surgery) Glory Buff, RN as Oncology Nurse Navigator Orlie Dakin, Tollie Pizza, MD as Consulting Physician (Oncology) Kathyrn Sheriff, [facility] as Pharmacist (Pharmacist) Salena Saner, MD as Consulting Physician (Pulmonary Disease) Carmina Miller, MD as Consulting Physician (Radiation Oncology)  CHIEF COMPLAINT: 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  INTERVAL HISTORY: Patient returns to clinic today for routine 108-month evaluation and discussion of her CT scan results.  She has left-sided chest pain, but otherwise feels well.  She has no neurologic complaints.  She denies any recent fevers or illnesses.  She has a good appetite and denies weight loss.  She has no shortness of breath, cough, or hemoptysis.  She denies any nausea, vomiting, constipation, or diarrhea.  She has no urinary complaints.  Patient offers no further specific complaints today.  REVIEW OF SYSTEMS:   Review of Systems  Constitutional: Negative.  Negative for fever, malaise/fatigue and weight loss.  Respiratory: Negative.  Negative for cough and shortness of breath.   Cardiovascular: Negative.  Negative for chest pain and leg swelling.  Gastrointestinal: Negative.  Negative for abdominal pain.  Genitourinary: Negative.  Negative for dysuria.  Musculoskeletal: Negative.  Negative for back pain.  Skin: Negative.  Negative for rash.  Neurological: Negative.  Negative for dizziness, focal weakness, weakness and headaches.  Psychiatric/Behavioral: Negative.  The patient is not nervous/anxious.     As per HPI. Otherwise, a  complete review of systems is negative.  PAST MEDICAL HISTORY: Past Medical History:  Diagnosis Date   Allergy    Anemia    PAST HX    Anginal pain (HCC)    Breast cancer (HCC)    C1 cervical fracture (HCC)    placed in c-collar-to f/u with neurosurgery on 06-21-21 in [place]   CA - cancer of ovary    Cancer (HCC) 02/2008   R breast  (est rec neg and brac neg)   Cataract    FORMING    Complication of anesthesia    deviated septum-lost memory for about 1 week after anesthesia   COPD (chronic obstructive pulmonary disease) (HCC)    DDD (degenerative disc disease)    in back with chronic pain   Depression    Diverticulosis    Dyspnea    with exertion   Eczema    Family history of colon cancer    GERD (gastroesophageal reflux disease)    Headache    h/o migraines   Hypertension    Mass of left lung    Osteopenia    Rosacea    Zoster 02/2007    PAST SURGICAL HISTORY: Past Surgical History:  Procedure Laterality Date   ABDOMINAL HYSTERECTOMY  1996   BSO fibroids, incidental ovarian CA finding   BASAL CELL CARCINOMA EXCISION  09/2016   bladder tack     BREAST SURGERY     rt total mastectomy for breast CA   CHOLECYSTECTOMY  08/1996   10 yrs ago   COLONOSCOPY     lipoma removal  11/2005   NASAL SEPTUM SURGERY     POLYPECTOMY     VIDEO BRONCHOSCOPY WITH ENDOBRONCHIAL ULTRASOUND Left 06/16/2021   Procedure: POSSIBLE VIDEO BRONCHOSCOPY WITH  ENDOBRONCHIAL ULTRASOUND;  Surgeon: Salena Saner, MD;  Location: [facility] ORS;  Service: Pulmonary;  Laterality: Left;    FAMILY HISTORY: Family History  Problem Relation Age of Onset   Hypertension Mother    Heart disease Mother        CAD   Stomach cancer Mother 11   Lung cancer Father 38   Cancer Sister        colon, skin   Heart disease Sister 23       MI   Diabetes Sister    Colon cancer Sister    Head & neck cancer Sister 48   Lung cancer Sister    Melanoma Sister        multiple on head   Heart disease  Brother        HTN and MI   Hypertension Brother    Cancer Maternal Aunt        unk type   Cancer Paternal Aunt        unk type   Cancer Paternal Uncle        unk type   Colon cancer Daughter 43   Esophageal cancer Neg Hx    Colon polyps Neg Hx     ADVANCED DIRECTIVES (Y/N):  N  HEALTH MAINTENANCE: Social History   Tobacco Use   Smoking status: Former    Packs/day: 2.00    Years: 10.00    Additional pack years: 0.00    Total pack years: 20.00    Types: Cigarettes    Quit date: 01/03/1990    Years since quitting: 32.4   Smokeless tobacco: Never  Vaping Use   Vaping Use: Never used  Substance Use Topics   Alcohol use: Yes    Alcohol/week: 5.0 standard drinks of alcohol    Types: 5 Glasses of wine per week    Comment: occ glass of wine   Drug use: No     Colonoscopy:  PAP:  Bone density:  Lipid panel:  Allergies  Allergen Reactions   Sulfa Antibiotics     Pt unsure of reaction    Current Outpatient Medications  Medication Sig Dispense Refill   amLODipine (NORVASC) 10 MG tablet TAKE 1 TABLET BY MOUTH EVERY DAY 90 tablet 1   Calcium Carbonate-Vitamin D (CALTRATE 600+D PO) Take 1,200 mg by mouth every evening.      Doxylamine Succinate, Sleep, (UNISOM PO) Take 50 mg by mouth at bedtime.     famotidine (PEPCID) 20 MG tablet TAKE 1 TABLET BY MOUTH TWICE A DAY 180 tablet 1   Ferrous Sulfate (IRON) 325 (65 Fe) MG TABS Take 1 tablet by mouth daily.     FLUoxetine (PROZAC) 20 MG capsule Take 3 capsules (60 mg total) by mouth daily. 270 capsule 1   Fluticasone-Umeclidin-Vilant (TRELEGY ELLIPTA) 100-62.5-25 MCG/ACT AEPB Inhale 1 puff into the lungs daily. 28 each 11   losartan (COZAAR) 50 MG tablet TAKE 1 TABLET BY MOUTH EVERY DAY 90 tablet 1   MAGNESIUM PO Take 500 mg by mouth daily.     MELATONIN PO Take 10 mg by mouth at bedtime.     Multiple Vitamin (MULTIVITAMIN) tablet Take 1 tablet by mouth daily.     Multiple Vitamins-Minerals (PRESERVISION AREDS 2 PO) Take 1  capsule by mouth 2 (two) times daily.     predniSONE (STERAPRED UNI-PAK 21 TAB) 10 MG (21) TBPK tablet Taper as directed 21 tablet 0   Secukinumab, 300 MG Dose, (COSENTYX, 300 MG DOSE,) 150 MG/ML SOSY  Inject 1 application as directed every 30 (thirty) days.     No current facility-administered medications for this visit.    OBJECTIVE: Vitals:   05/26/22 1404  BP: (!) 146/73  Pulse: (!) 57  Resp: 18  Temp: 98.4 F (36.9 C)  SpO2: 99%     Body mass index is 26.48 kg/m.    ECOG FS:0 - Asymptomatic  General: Well-developed, well-nourished, no acute distress. Eyes: Pink conjunctiva, anicteric sclera. HEENT: Normocephalic, moist mucous membranes. Lungs: No audible wheezing or coughing. Heart: Regular rate and rhythm. Abdomen: Soft, nontender, no obvious distention. Musculoskeletal: No edema, cyanosis, or clubbing. Neuro: Alert, answering all questions appropriately. Cranial nerves grossly intact. Skin: No rashes or petechiae noted. Psych: Normal affect.  LAB RESULTS:  Lab Results  Component Value Date   NA 139 04/06/2022   K 4.5 04/06/2022   CL 103 04/06/2022   CO2 30 04/06/2022   GLUCOSE 87 04/06/2022   BUN 23 04/06/2022   CREATININE 1.19 04/06/2022   CALCIUM 9.1 04/06/2022   PROT 6.9 04/06/2022   ALBUMIN 4.0 04/06/2022   AST 25 04/06/2022   ALT 20 04/06/2022   ALKPHOS 91 04/06/2022   BILITOT 0.3 04/06/2022   GFRNONAA 39 (L) 05/20/2021   GFRAA (L) 05/07/2008    56        The eGFR has been calculated using the MDRD equation. This calculation has not been validated in all clinical situations. eGFR's persistently <60 mL/min signify possible Chronic Kidney Disease.    Lab Results  Component Value Date   WBC 7.7 04/06/2022   NEUTROABS 5.2 04/06/2022   HGB 11.2 (L) 04/06/2022   HCT 33.7 (L) 04/06/2022   MCV 83.1 04/06/2022   PLT 290.0 04/06/2022     STUDIES: CT Chest W Contrast  Result Date: 05/14/2022 CLINICAL DATA:  Left upper lobe lung cancer, status  post radiation. History of right breast cancer, status post mastectomy. History of ovarian cancer, status post hysterectomy. EXAM: CT CHEST WITH CONTRAST TECHNIQUE: Multidetector CT imaging of the chest was performed during intravenous contrast administration. RADIATION DOSE REDUCTION: This exam was performed according to the departmental dose-optimization program which includes automated exposure control, adjustment of the mA and/or kV according to patient size and/or use of iterative reconstruction technique. CONTRAST:  60mL OMNIPAQUE IOHEXOL 300 MG/ML  SOLN COMPARISON:  PET-CT dated 11/11/2021 FINDINGS: Cardiovascular: The heart is normal in size. No pericardial effusion. No evidence of thoracic aortic aneurysm. Atherosclerotic calcifications of the aortic arch. Mediastinum/Nodes: 7 mm short axis low right paratracheal node, within normal limits. 3.7 cm left thyroid nodule (series 2/image 14). In the setting of significant comorbidities or limited life expectancy, no follow-up recommended (ref: J Am Coll Radiol. 2015 Feb;12(2): 143-50). Lungs/Pleura: Mild centrilobular and paraseptal emphysematous changes, upper lung predominant. Mild biapical pleural-parenchymal scarring. Suspected radiation changes in the left upper lobe, new/progressive, including nodular soft tissue in the medial left upper lobe (series 3/image 27). While this is new from the prior, this is a reasonable time course to be related to radiation changes, although warranting attention on follow-up. Underlying 14 x 19 mm nodule in the left upper lobe (series 3/image 26), previously 17 x 19 mm, stable versus mildly improved. This was non FDG avid on prior PET and favors treated neoplasm. No new/suspicious pulmonary nodules. No focal consolidation. No pleural effusion or pneumothorax. Upper Abdomen: Visualized upper abdomen is grossly unremarkable, noting vascular calcifications and a moderate midline anterior ventral hernia (series 2/image 145).  Musculoskeletal: Degenerative changes of the  visualized thoracolumbar spine. Healing left lateral 3rd rib fracture (series 2/image 35), likely pathologic in the setting of radiation therapy. IMPRESSION: Suspected radiation changes in the left upper lobe, new/progressive, as above. Attention on follow-up is suggested. Underlying improving left upper lobe nodule, favoring treated neoplasm. Healing left lateral 3rd rib fracture, likely pathologic in the setting of radiation therapy. Aortic Atherosclerosis (ICD10-I70.0) and Emphysema (ICD10-J43.9). Electronically Signed   By: Charline Bills M.D.   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  Biopsy confirmed the results.  Surgical resection was discussed, but patient opted to pursue XRT only completing in August 2023.  Repeat PET scan on November 13, 2021 with essential resolution of hypermetabolism of the lesion.  Her most recent imaging with CT scan on May 14, 2022 reviewed independently and report as above with no obvious evidence of recurrent or progressive disease.  Continue CT scans every 6 months for 2 years through August 2025 at which point patient can then receive CT scans yearly.  Return to clinic in 6 months for further evaluation and discussion of her imaging.   History of breast cancer: Greater than 15 years ago.  Patient states she underwent mastectomy, but did not receive any XRT, chemotherapy, or hormonal treatment. History of ovarian cancer: Patient reports this was an incidental on pathology after having a total hysterectomy. History of melanoma: Unclear depth or stage. Thyroid nodule: Reported stable.  Monitor. Genetics: Patient was previously given a referral to genetics. Chest pain: Possibly related to healing left  third rib fracture or radiation pneumonitis.  Patient was given a steroid taper.  Patient expressed understanding and was in agreement with this plan. She also understands that She can call clinic at any time with any questions, concerns, or complaints.    Cancer Staging  Squamous cell carcinoma of left lung [facility]) Staging form: Lung, AJCC 8th Edition - Clinical stage from 06/23/2021: Stage IB (cT2a, cN0, cM0) - Signed by Jeralyn Ruths, MD on 06/23/2021   Jeralyn Ruths, MD   05/26/2022 2:44 PM

## 2022-06-06 ENCOUNTER — Encounter (INDEPENDENT_AMBULATORY_CARE_PROVIDER_SITE_OTHER): Payer: HMO | Admitting: Ophthalmology

## 2022-06-06 DIAGNOSIS — H43813 Vitreous degeneration, bilateral: Secondary | ICD-10-CM

## 2022-06-06 DIAGNOSIS — H35033 Hypertensive retinopathy, bilateral: Secondary | ICD-10-CM | POA: Diagnosis not present

## 2022-06-06 DIAGNOSIS — H34812 Central retinal vein occlusion, left eye, with macular edema: Secondary | ICD-10-CM

## 2022-06-06 DIAGNOSIS — H353131 Nonexudative age-related macular degeneration, bilateral, early dry stage: Secondary | ICD-10-CM

## 2022-06-09 ENCOUNTER — Encounter (INDEPENDENT_AMBULATORY_CARE_PROVIDER_SITE_OTHER): Payer: HMO | Admitting: Ophthalmology

## 2022-06-13 IMAGING — DX DG FOOT COMPLETE 3+V*L*
3 series · 3 of 3 positions shown · non-contrast
Comparison: None.

CLINICAL DATA: Pain in arch of foot.  No known injury.

EXAM:
LEFT FOOT - COMPLETE 3+ VIEW

[foot ap]
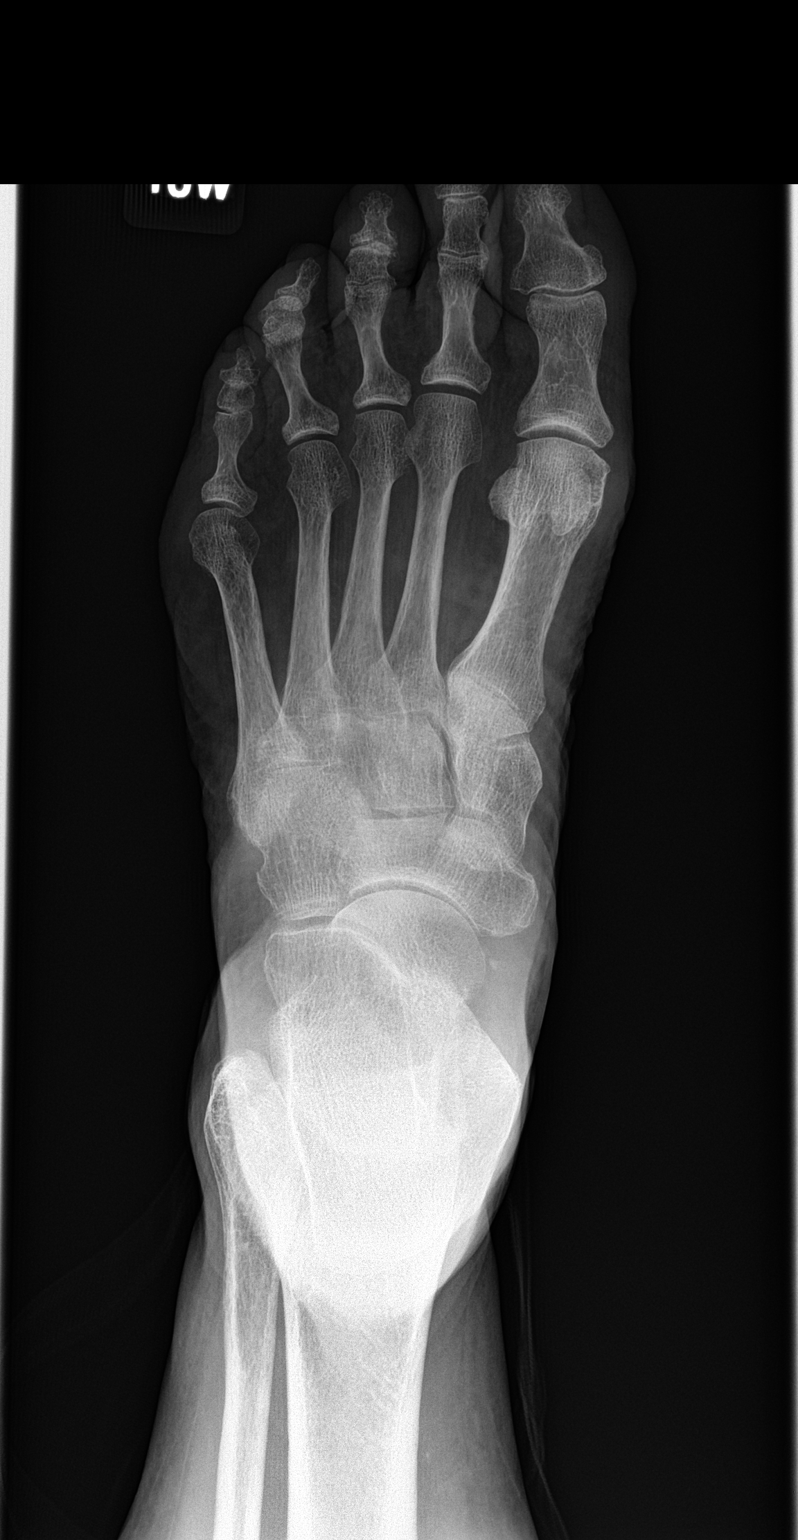

[foot obl]
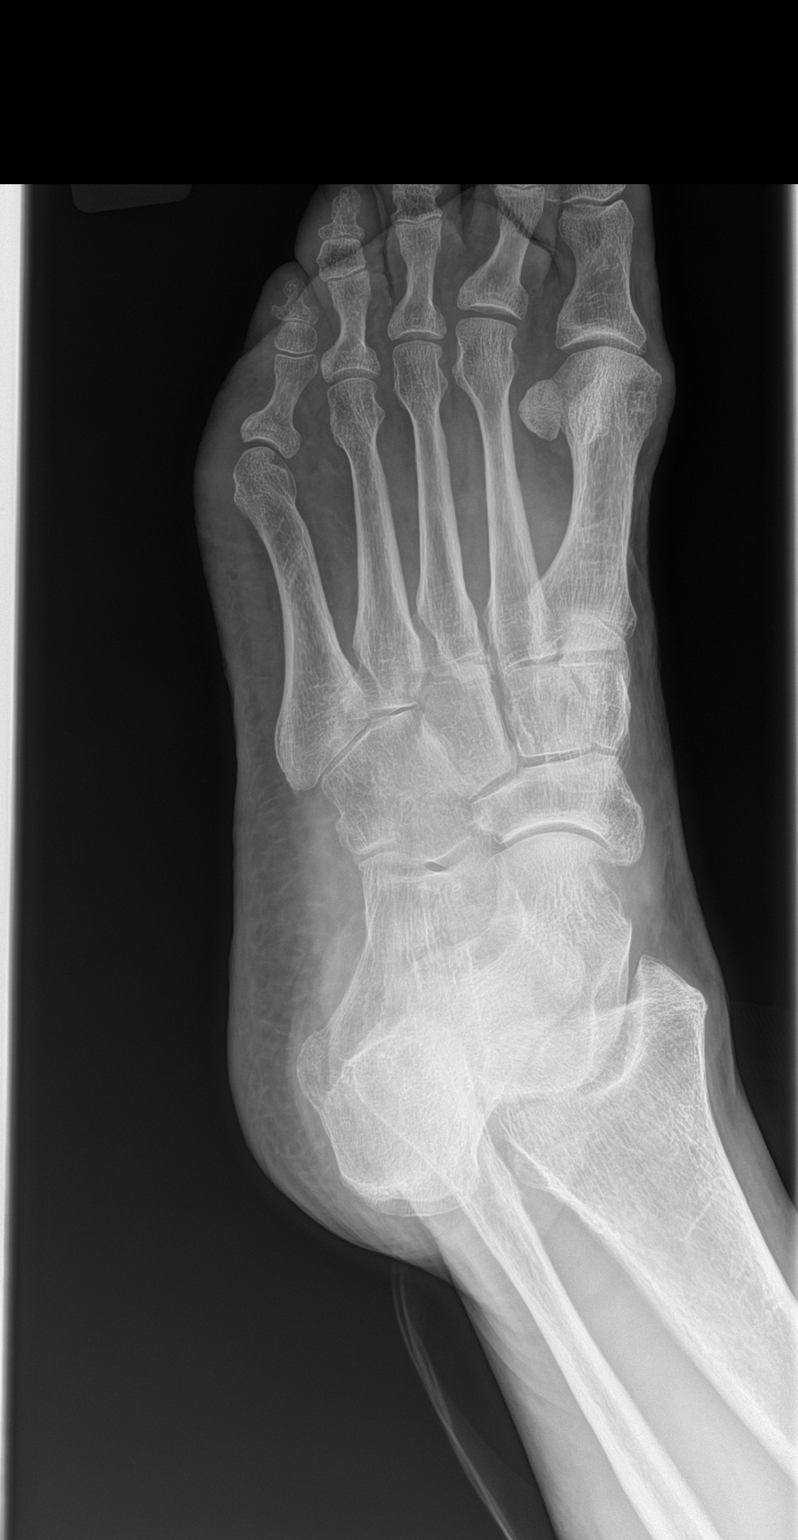

[foot lat]
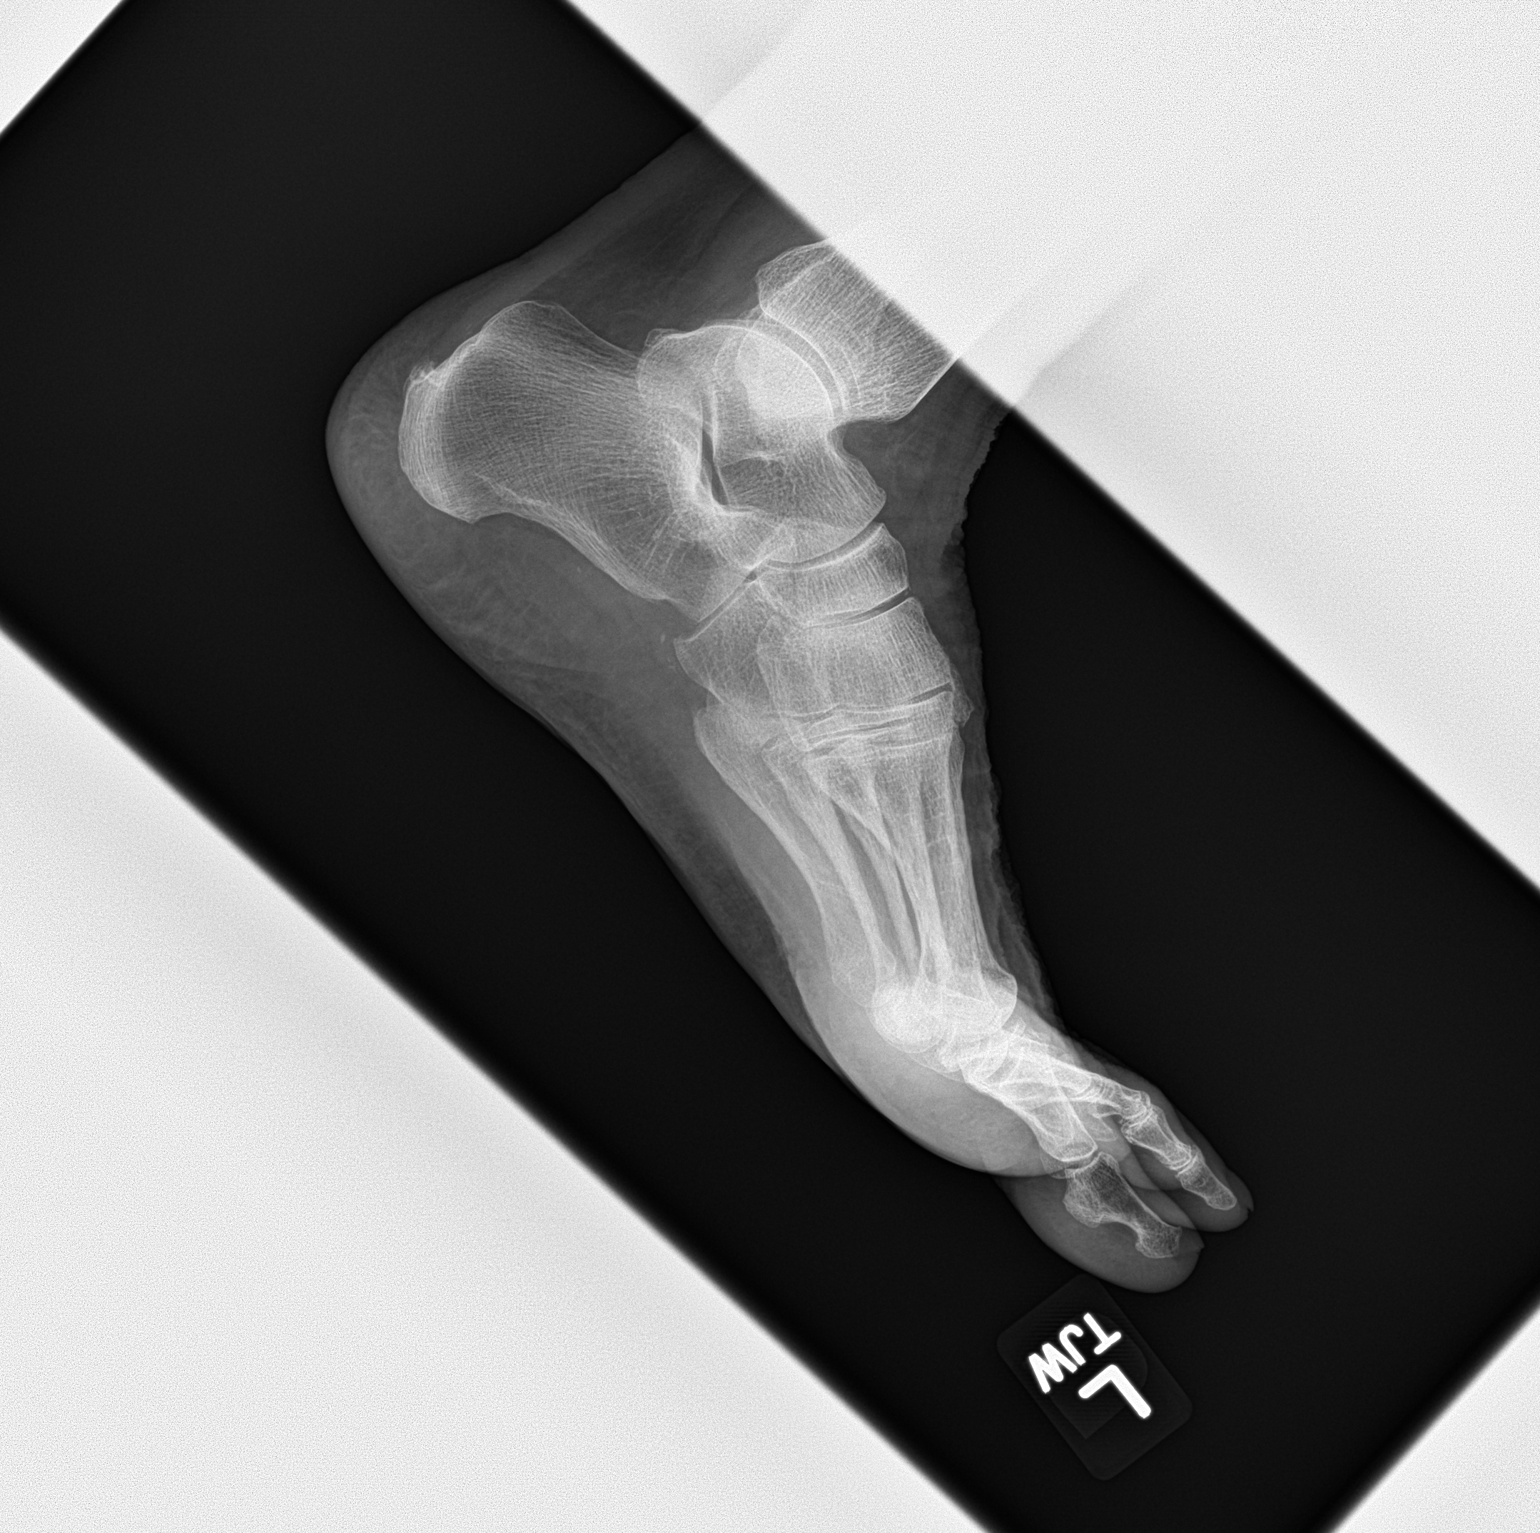

[3 of 3 positions shown; findings below may reference images not displayed]

FINDINGS: No acute bony abnormality. Specifically, no fracture, subluxation,
or dislocation. Joint spaces maintained. Soft tissues are intact.
IMPRESSION: Negative.

## 2022-06-20 DIAGNOSIS — N1831 Chronic kidney disease, stage 3a: Secondary | ICD-10-CM | POA: Diagnosis not present

## 2022-07-18 ENCOUNTER — Telehealth: Payer: Self-pay | Admitting: Family Medicine

## 2022-07-18 DIAGNOSIS — D171 Benign lipomatous neoplasm of skin and subcutaneous tissue of trunk: Secondary | ICD-10-CM

## 2022-07-18 NOTE — Telephone Encounter (Signed)
She has a lipoma on abdomen noted in last note Does she [place]

## 2022-07-18 NOTE — Telephone Encounter (Signed)
Patient called in to let Dr tower that she is ready to go forward with her hernia surgery. Dr Milinda Antis told her to call her when she's ready.

## 2022-07-19 NOTE — Addendum Note (Signed)
Addended by: Roxy Manns A on: 07/19/2022 08:31 PM   Modules accepted: Orders

## 2022-07-19 NOTE — Telephone Encounter (Signed)
Pt would like to see someone in [place]. Pt said she would like to have a female if any are available. Pt advise PCP will do referral and she should get a call within 2 weeks but if she doesn't then let us know.

## 2022-07-19 NOTE — Telephone Encounter (Signed)
I put the referral in  Please let us know if you don't hear in 1-2 weeks  is in

## 2022-07-25 ENCOUNTER — Ambulatory Visit (INDEPENDENT_AMBULATORY_CARE_PROVIDER_SITE_OTHER): Payer: HMO | Admitting: Surgery

## 2022-07-25 ENCOUNTER — Encounter: Payer: Self-pay | Admitting: Surgery

## 2022-07-25 VITALS — BP 139/56 | HR 58 | Temp 98.0°F | Ht 60.0 in | Wt 133.0 lb

## 2022-07-25 DIAGNOSIS — K432 Incisional hernia 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 gangrene: Secondary | ICD-10-CM | POA: Diagnosis not present

## 2022-07-25 NOTE — Progress Notes (Signed)
Request for Medical Clearance has been faxed to Dr Roxy Manns.

## 2022-07-25 NOTE — Patient Instructions (Addendum)
You will need to hold your Cosentyx prior to surgery.   You have requested to have a Ventral Hernia Repair. This will be done by Dr Aleen Campi at [facility]. Please see your (BLUE) Pre-care sheet for more information. Our surgery scheduler will call you to look at surgery dates and to go over surgery information.   You will need to arrange to be out of work for approximately 1-2 weeks and then you may return with a lifting restriction for 4 more weeks. If you have FMLA or Disability paperwork that needs to be filled out, please have your company fax your paperwork to [phone] or you may drop this by either office. This paperwork will be filled out within 3 days after your surgery has been completed.     Ventral Hernia A ventral hernia (also called an incisional hernia) is a hernia that occurs at the site of a previous surgical cut (incision) in the abdomen. The abdominal wall spans from your lower chest down to your pelvis. If the abdominal wall is weakened from a surgical incision, a hernia can occur. A hernia is a bulge of bowel or muscle tissue pushing out on the weakened part of the abdominal wall. Ventral hernias can get bigger from straining or lifting. Obese and older people are at higher risk for a ventral hernia. People who develop infections after surgery or require repeat incisions at the same site on the abdomen are also at increased risk. CAUSES  A ventral hernia occurs because of weakness in the abdominal wall at an incision site.  SYMPTOMS  Common symptoms include: A visible bulge or lump on the abdominal wall. Pain or tenderness around the lump. Increased discomfort if you cough or make a sudden movement. If the hernia has blocked part of the intestine, a serious complication can occur (incarcerated or strangulated hernia). This can become a problem that requires emergency surgery because the blood flow to the blocked intestine may be cut off. Symptoms may include: Feeling sick to your  stomach (nauseous). Throwing up (vomiting). Stomach swelling (distention) or bloating. Fever. Rapid heartbeat. DIAGNOSIS  Your health care provider will take a medical history and perform a physical exam. Various tests may be ordered, such as: Blood tests. Urine tests. Ultrasonography. X-rays. Computed tomography (CT). TREATMENT  Watchful waiting may be all that is needed for a smaller hernia that does not cause symptoms. Your health care provider may recommend the use of a supportive belt (truss) that helps to keep the abdominal wall intact. For larger hernias or those that cause pain, surgery to repair the hernia is usually recommended. If a hernia becomes strangulated, emergency surgery needs to be done right away. HOME CARE INSTRUCTIONS Avoid putting pressure or strain on the abdominal area. Avoid heavy lifting. Use good body positioning for physical tasks. Ask your health care provider about proper body positioning. Use a supportive belt as directed by your health care provider. Maintain a healthy weight. Eat foods that are high in fiber, such as whole grains, fruits, and vegetables. Fiber helps prevent difficult bowel movements (constipation). Drink enough fluids to keep your urine clear or pale yellow. Follow up with your health care provider as directed. SEEK MEDICAL CARE IF:  Your hernia seems to be getting larger or more painful. SEEK IMMEDIATE MEDICAL CARE IF:  You have abdominal pain that is sudden and sharp. Your pain becomes severe. You have repeated vomiting. You are sweating a lot. You notice a rapid heartbeat. You develop a fever. MAKE SURE  YOU:  Understand these instructions. Will watch your condition. Will get help right away if you are not doing well or get worse.     Open Ventral Hernia Repair Open ventral hernia repair is a surgery to fix a ventral hernia. A ventral hernia,  is a bulge of body tissue or intestines that pushes through the front part of the  abdomen. This can happen if the connective tissue covering the muscles over the abdomen has a weak spot or is torn because of a surgical cut (incision) from a previous surgery. A ventral hernia repair is often done soon after diagnosis to stop the hernia from getting bigger, becoming uncomfortable, or becoming an emergency. This surgery usually takes about 2 hours, but the time can vary greatly.  LET [facility] CARE PROVIDER KNOW ABOUT: Any allergies you have. All medicines you are taking, including steroids, vitamins, herbs, eye drops, creams, and over-the-counter medicines. Previous problems you or members of your family have had with the use of anesthetics. Any blood disorders you have. Previous surgeries you have had. Medical conditions you have.  RISKS AND COMPLICATIONS  Generally, Open ventral hernia repair is a safe procedure. However, as with any surgical procedure, problems can occur. Possible problems include: Bleeding. Trouble passing urine or having a bowel movement after the surgery. Infection. Pneumonia. Blood clots. Pain in the area of the hernia. A bulge in the area of the hernia that may be caused by a collection of fluid. Injury to intestines or other structures in the abdomen. Return of the hernia after surgery.  BEFORE THE PROCEDURE  You may need to have blood tests, urine tests, a chest X-ray, or an electrocardiogram done before the day of the surgery. Ask your health care provider about changing or stopping your regular medicines. This is especially important if you are taking diabetes medicines or blood thinners. You may need to wash with a special type of germ-killing soap. Do not eat or drink anything after midnight the night before the procedure or as directed by your health care provider. Make plans to have someone drive you home after the procedure.  PROCEDURE  Small monitors will be put on your body. They are used to check your heart, blood pressure, and  oxygen level. An IV access tube will be put into a vein in your hand or arm. Fluids and medicine will flow directly into your body through the IV tube. You will be given medicine that makes you go to sleep (general anesthetic). Your abdomen will be cleaned with a special soap to kill any germs on your skin. Once you are asleep, a moderate - large size incision will be made in your abdomen. The size of incision depends on how large your hernia is. Your surgeon puts the tissue or intestines that formed the hernia back in place. A screen-like patch (mesh) is used to close the hernia. This helps make the area stronger. Stitches, tacks, or staples are used to keep the mesh in place. Medicine and a bandage (dressing) or skin glue will be put over the incision.  AFTER THE PROCEDURE  You will stay in a recovery area until the anesthetic wears off. Your blood pressure and pulse will be checked often. You may be able to go home the same day or may need to stay in the hospital for 1-2 days after surgery. Your surgeon will decide when you can go home depending upon your recovery. You may feel some pain. You will be given medicine  for pain. You will be urged to do breathing exercises that involve taking deep breaths. This helps prevent a lung infection after a surgery. You may have to wear compression stockings while you are in the hospital. These stockings help keep blood clots from forming in your legs.   This information is not intended to replace advice given to you by your health care provider. Make sure you discuss any questions you have with your health care provider.   Document Released: 12/07/2011 Document Revised: 12/25/2012 Document Reviewed: 12/07/2011 Elsevier Interactive Patient Education [employer]

## 2022-07-25 NOTE — Progress Notes (Signed)
07/25/2022  Reason for Visit:  Ventral hernia  Requesting Provider:  Roxy Manns, MD  History of Present Illness: Pamela Morales is a 82 y.o. female presenting for evaluation of a ventral hernia.  The patient reports that she has had this hernia for many years.  She feels that it has been growing in size, and although at first it was not really causing any issues, she reports that more recently it's been getting bigger and is becoming more uncomfortable, particularly with her active lifestyle.  She feels the bulging is more stuck outside.  She denies any nausea or vomiting, constipation or diarrhea, but she reports increased pressure at the hernia site when she is bending forward.  Denies any overlying skin changes and the area remains soft.  She has a history of laparoscopic cholecystectomy and a right mastectomy with native tissue reconstruction that the patient reports was obtained from her abdominal wall.  She also has a history of left lung cancer and is s/p radiation therapy.  On her routine CT scans, the incisional ventral hernia is seen in the epigastric area containing fat.  Past Medical History: Past Medical History:  Diagnosis Date   Allergy    Anemia    PAST HX    Anginal pain (HCC)    Breast cancer (HCC)    C1 cervical fracture (HCC)    placed in c-collar-to f/u with neurosurgery on 06-21-21 in [place]   CA - cancer of ovary    Cancer (HCC) 02/2008   R breast  (est rec neg and brac neg)   Cataract    FORMING    Complication of anesthesia    deviated septum-lost memory for about 1 week after anesthesia   COPD (chronic obstructive pulmonary disease) (HCC)    DDD (degenerative disc disease)    in back with chronic pain   Depression    Diverticulosis    Dyspnea    with exertion   Eczema    Family history of colon cancer    GERD (gastroesophageal reflux disease)    Headache    h/o migraines   Hypertension    Mass of left lung    Osteopenia    Rosacea    Zoster  02/2007     Past Surgical History: Past Surgical History:  Procedure Laterality Date   ABDOMINAL HYSTERECTOMY  1996   BSO fibroids, incidental ovarian CA finding   BASAL CELL CARCINOMA EXCISION  09/2016   bladder tack     BREAST SURGERY     rt total mastectomy for breast CA   CHOLECYSTECTOMY  08/1996   10 yrs ago   COLONOSCOPY     lipoma removal  11/2005   NASAL SEPTUM SURGERY     POLYPECTOMY     VIDEO BRONCHOSCOPY WITH ENDOBRONCHIAL ULTRASOUND Left 06/16/2021   Procedure: POSSIBLE VIDEO BRONCHOSCOPY WITH ENDOBRONCHIAL ULTRASOUND;  Surgeon: Salena Saner, MD;  Location: [facility] ORS;  Service: Pulmonary;  Laterality: Left;    Home Medications: Prior to Admission medications   Medication Sig Start Date End Date Taking? Authorizing Provider  amLODipine (NORVASC) 10 MG tablet TAKE 1 TABLET BY MOUTH EVERY DAY 02/14/22  Yes Tower, Audrie Gallus, MD  Calcium Carbonate-Vitamin D (CALTRATE 600+D PO) Take 1,200 mg by mouth every evening.    Yes [provider]  Doxylamine Succinate, Sleep, (UNISOM PO) Take 50 mg by mouth at bedtime.   Yes [provider]  famotidine (PEPCID) 20 MG tablet TAKE 1 TABLET BY MOUTH TWICE A  DAY 05/02/22  Yes Tower, Audrie Gallus, MD  Ferrous Sulfate (IRON) 325 (65 Fe) MG TABS Take 1 tablet by mouth daily.   Yes [provider]  FLUoxetine (PROZAC) 20 MG capsule Take 3 capsules (60 mg total) by mouth daily. 04/08/22  Yes Tower, Audrie Gallus, MD  Fluticasone-Umeclidin-Vilant (TRELEGY ELLIPTA) 100-62.5-25 MCG/ACT AEPB Inhale 1 puff into the lungs daily. 07/13/21  Yes Salena Saner, MD  losartan (COZAAR) 50 MG tablet TAKE 1 TABLET BY MOUTH EVERY DAY 05/05/22  Yes Tower, Marne A, MD  MAGNESIUM PO Take 500 mg by mouth daily.   Yes [provider]  MELATONIN PO Take 10 mg by mouth at bedtime.   Yes [provider]  Multiple Vitamin (MULTIVITAMIN) tablet Take 1 tablet by mouth daily.   Yes [provider]  Multiple Vitamins-Minerals  (PRESERVISION AREDS 2 PO) Take 1 capsule by mouth 2 (two) times daily.   Yes [provider]  Secukinumab, 300 MG Dose, (COSENTYX, 300 MG DOSE,) 150 MG/ML SOSY Inject 1 application as directed every 30 (thirty) days. 08/12/19  Yes [provider]    Allergies: Allergies  Allergen Reactions   Sulfa Antibiotics     Pt unsure of reaction    Social History:  reports that she quit smoking about 32 years ago. Her smoking use included cigarettes. She started smoking about 42 years ago. She has a 20 pack-year smoking history. She has been exposed to tobacco smoke. She has never used smokeless tobacco. She reports current alcohol use of about 5.0 standard drinks of alcohol per week. She reports that she does not use drugs.   Family History: Family History  Problem Relation Age of Onset   Hypertension Mother    Heart disease Mother        CAD   Stomach cancer Mother 61   Lung cancer Father 66   Cancer Sister        colon, skin   Heart disease Sister 69       MI   Diabetes Sister    Colon cancer Sister    Head & neck cancer Sister 94   Lung cancer Sister    Melanoma Sister        multiple on head   Heart disease Brother        HTN and MI   Hypertension Brother    Cancer Maternal Aunt        unk type   Cancer Paternal Aunt        unk type   Cancer Paternal Uncle        unk type   Colon cancer Daughter 61   Esophageal cancer Neg Hx    Colon polyps Neg Hx     Review of Systems: Review of Systems  Constitutional:  Negative for chills and fever.  HENT:  Negative for hearing loss.   Respiratory:  Negative for shortness of breath.   Cardiovascular:  Negative for chest pain.  Gastrointestinal:  Positive for abdominal pain. Negative for nausea and vomiting.  Genitourinary:  Negative for dysuria.  Musculoskeletal:  Negative for myalgias.  Skin:  Negative for rash.  Neurological:  Negative for dizziness.  Psychiatric/Behavioral:  Negative for depression.      Physical Exam BP (!) 139/56   Pulse (!) 58   Temp 98 F (36.7 C)   Ht 5' (1.524 m)   Wt 133 lb (60.3 kg)   SpO2 97%   BMI 25.97 kg/m  CONSTITUTIONAL: No acute distress,  well nourished. HEENT:  Normocephalic, atraumatic, extraocular motion intact. NECK: Trachea is midline, and there is no jugular venous distension.  RESPIRATORY:  Lungs are clear, and breath sounds are equal bilaterally. Normal respiratory effort without pathologic use of accessory muscles. CARDIOVASCULAR: Heart is regular without murmurs, gallops, or rubs. GI: The abdomen is soft, non-distended, with some discomfort to palpation in the epigastric region at the site of her hernia.  This is is also at the site of an epigastric scar likely from her cholecystectomy.  The hernia itself is soft, and is reducible with some manipulation.  The hernia defect is about 3.5 cm wide.  MUSCULOSKELETAL:  Normal muscle strength and tone in all four extremities.  No peripheral edema or cyanosis. SKIN: Skin turgor is normal. There are no pathologic skin lesions.  NEUROLOGIC:  Motor and sensation is grossly normal.  Cranial nerves are grossly intact. PSYCH:  Alert and oriented to person, place and time. Affect is normal.  Laboratory Analysis: Labs from 04/06/22: Na 139, K 4.5, Cl 103, CO2 30, BUN 23, Cr 1.19.  LFTs within normal.  WBC 7.7, Hgb 11.2, Hct 33.7, Plt 290  Imaging: CT chest on 05/11/22: FINDINGS: Cardiovascular: The heart is normal in size. No pericardial effusion.   No evidence of thoracic aortic aneurysm. Atherosclerotic calcifications of the aortic arch.   Mediastinum/Nodes: 7 mm short axis low right paratracheal node, within normal limits.   3.7 cm left thyroid nodule (series 2/image 14). In the setting of significant comorbidities or limited life expectancy, no follow-up recommended (ref: J Am Coll Radiol. 2015 Feb;12(2): 143-50).   Lungs/Pleura: Mild centrilobular and paraseptal emphysematous changes, upper  lung predominant.   Mild biapical pleural-parenchymal scarring.   Suspected radiation changes in the left upper lobe, new/progressive, including nodular soft tissue in the medial left upper lobe (series 3/image 27). While this is new from the prior, this is a reasonable time course to be related to radiation changes, although warranting attention on follow-up.   Underlying 14 x 19 mm nodule in the left upper lobe (series 3/image 26), previously 17 x 19 mm, stable versus mildly improved. This was non FDG avid on prior PET and favors treated neoplasm.   No new/suspicious pulmonary nodules.   No focal consolidation.   No pleural effusion or pneumothorax.   Upper Abdomen: Visualized upper abdomen is grossly unremarkable, noting vascular calcifications and a moderate midline anterior ventral hernia (series 2/image 145).   Musculoskeletal: Degenerative changes of the visualized thoracolumbar spine. Healing left lateral 3rd rib fracture (series 2/image 35), likely pathologic in the setting of radiation therapy.   IMPRESSION: Suspected radiation changes in the left upper lobe, new/progressive, as above. Attention on follow-up is suggested.   Underlying improving left upper lobe nodule, favoring treated neoplasm.   Healing left lateral 3rd rib fracture, likely pathologic in the setting of radiation therapy.   Aortic Atherosclerosis (ICD10-I70.0) and Emphysema (ICD10-J43.9).  Assessment and Plan: This is a 82 y.o. female with a likely incisional ventral hernia  --Discussed with the patient the findings on her CT scan and on exam today. She has a hernia in the epigastric region, at the site of a prior laparoscopic port.  This may be incisional from her cholecystectomy, though could possibly be incisional while doing reconstruction from her right mastectomy.  Nonetheless, her hernia on exam and CT scan is fat containing and there is no evidence of strangulation.  Discussed with her that  given the increasing size and symptoms, we can offer repair  of her hernia. --Discussed with her the plan for a robotic assisted ventral hernia repair and reviewed the surgery at length with her including the planned incisions, the risks of bleeding, infection, injury to surrounding structures, the use of mesh to reinforce the repair, that this would be an outpatient surgery, post-operative activity restrictions, pain control, and she's willing to proceed. --The patient currently takes Cosentyx for psoriatic arthritis.  She reports her dose was around July 7th.  Discussed with her having to be off the medication for 5 weeks prior to doing her surgery.  She will skip her August dose. --Will schedule her surgery for 08/16/22.  Will also send for medical clearance.  All of her questions have been answered.  I spent 55 minutes dedicated to the care of this patient on the date of this encounter to include pre-visit review of records, face-to-face time with the patient discussing diagnosis and management, and any post-visit coordination of care.   Howie Ill, MD [facility]

## 2022-07-25 NOTE — H&P (View-Only) (Signed)
 07/25/2022  Reason for Visit:  Ventral hernia  Requesting Provider:  Roxy Manns, MD  History of Present Illness: Pamela Morales is a 82 y.o. female presenting for evaluation of a ventral hernia.  The patient reports that she has had this hernia for many years.  She feels that it has been growing in size, and although at first it was not really causing any issues, she reports that more recently it's been getting bigger and is becoming more uncomfortable, particularly with her active lifestyle.  She feels the bulging is more stuck outside.  She denies any nausea or vomiting, constipation or diarrhea, but she reports increased pressure at the hernia site when she is bending forward.  Denies any overlying skin changes and the area remains soft.  She has a history of laparoscopic cholecystectomy and a right mastectomy with native tissue reconstruction that the patient reports was obtained from her abdominal wall.  She also has a history of left lung cancer and is s/p radiation therapy.  On her routine CT scans, the incisional ventral hernia is seen in the epigastric area containing fat.  Past Medical History: Past Medical History:  Diagnosis Date   Allergy    Anemia    PAST HX    Anginal pain (HCC)    Breast cancer (HCC)    C1 cervical fracture (HCC)    placed in c-collar-to f/u with neurosurgery on 06-21-21 in [place]   CA - cancer of ovary    Cancer (HCC) 02/2008   R breast  (est rec neg and brac neg)   Cataract    FORMING    Complication of anesthesia    deviated septum-lost memory for about 1 week after anesthesia   COPD (chronic obstructive pulmonary disease) (HCC)    DDD (degenerative disc disease)    in back with chronic pain   Depression    Diverticulosis    Dyspnea    with exertion   Eczema    Family history of colon cancer    GERD (gastroesophageal reflux disease)    Headache    h/o migraines   Hypertension    Mass of left lung    Osteopenia    Rosacea    Zoster  02/2007     Past Surgical History: Past Surgical History:  Procedure Laterality Date   ABDOMINAL HYSTERECTOMY  1996   BSO fibroids, incidental ovarian CA finding   BASAL CELL CARCINOMA EXCISION  09/2016   bladder tack     BREAST SURGERY     rt total mastectomy for breast CA   CHOLECYSTECTOMY  08/1996   10 yrs ago   COLONOSCOPY     lipoma removal  11/2005   NASAL SEPTUM SURGERY     POLYPECTOMY     VIDEO BRONCHOSCOPY WITH ENDOBRONCHIAL ULTRASOUND Left 06/16/2021   Procedure: POSSIBLE VIDEO BRONCHOSCOPY WITH ENDOBRONCHIAL ULTRASOUND;  Surgeon: Salena Saner, MD;  Location: [facility] ORS;  Service: Pulmonary;  Laterality: Left;    Home Medications: Prior to Admission medications   Medication Sig Start Date End Date Taking? Authorizing Provider  amLODipine (NORVASC) 10 MG tablet TAKE 1 TABLET BY MOUTH EVERY DAY 02/14/22  Yes Tower, Audrie Gallus, MD  Calcium Carbonate-Vitamin D (CALTRATE 600+D PO) Take 1,200 mg by mouth every evening.    Yes [provider]  Doxylamine Succinate, Sleep, (UNISOM PO) Take 50 mg by mouth at bedtime.   Yes [provider]  famotidine (PEPCID) 20 MG tablet TAKE 1 TABLET BY MOUTH TWICE A  DAY 05/02/22  Yes Tower, Audrie Gallus, MD  Ferrous Sulfate (IRON) 325 (65 Fe) MG TABS Take 1 tablet by mouth daily.   Yes [provider]  FLUoxetine (PROZAC) 20 MG capsule Take 3 capsules (60 mg total) by mouth daily. 04/08/22  Yes Tower, Audrie Gallus, MD  Fluticasone-Umeclidin-Vilant (TRELEGY ELLIPTA) 100-62.5-25 MCG/ACT AEPB Inhale 1 puff into the lungs daily. 07/13/21  Yes Salena Saner, MD  losartan (COZAAR) 50 MG tablet TAKE 1 TABLET BY MOUTH EVERY DAY 05/05/22  Yes Tower, Marne A, MD  MAGNESIUM PO Take 500 mg by mouth daily.   Yes [provider]  MELATONIN PO Take 10 mg by mouth at bedtime.   Yes [provider]  Multiple Vitamin (MULTIVITAMIN) tablet Take 1 tablet by mouth daily.   Yes [provider]  Multiple Vitamins-Minerals  (PRESERVISION AREDS 2 PO) Take 1 capsule by mouth 2 (two) times daily.   Yes [provider]  Secukinumab, 300 MG Dose, (COSENTYX, 300 MG DOSE,) 150 MG/ML SOSY Inject 1 application as directed every 30 (thirty) days. 08/12/19  Yes [provider]    Allergies: Allergies  Allergen Reactions   Sulfa Antibiotics     Pt unsure of reaction    Social History:  reports that she quit smoking about 32 years ago. Her smoking use included cigarettes. She started smoking about 42 years ago. She has a 20 pack-year smoking history. She has been exposed to tobacco smoke. She has never used smokeless tobacco. She reports current alcohol use of about 5.0 standard drinks of alcohol per week. She reports that she does not use drugs.   Family History: Family History  Problem Relation Age of Onset   Hypertension Mother    Heart disease Mother        CAD   Stomach cancer Mother 61   Lung cancer Father 66   Cancer Sister        colon, skin   Heart disease Sister 69       MI   Diabetes Sister    Colon cancer Sister    Head & neck cancer Sister 94   Lung cancer Sister    Melanoma Sister        multiple on head   Heart disease Brother        HTN and MI   Hypertension Brother    Cancer Maternal Aunt        unk type   Cancer Paternal Aunt        unk type   Cancer Paternal Uncle        unk type   Colon cancer Daughter 61   Esophageal cancer Neg Hx    Colon polyps Neg Hx     Review of Systems: Review of Systems  Constitutional:  Negative for chills and fever.  HENT:  Negative for hearing loss.   Respiratory:  Negative for shortness of breath.   Cardiovascular:  Negative for chest pain.  Gastrointestinal:  Positive for abdominal pain. Negative for nausea and vomiting.  Genitourinary:  Negative for dysuria.  Musculoskeletal:  Negative for myalgias.  Skin:  Negative for rash.  Neurological:  Negative for dizziness.  Psychiatric/Behavioral:  Negative for depression.      Physical Exam BP (!) 139/56   Pulse (!) 58   Temp 98 F (36.7 C)   Ht 5' (1.524 m)   Wt 133 lb (60.3 kg)   SpO2 97%   BMI 25.97 kg/m  CONSTITUTIONAL: No acute distress,  well nourished. HEENT:  Normocephalic, atraumatic, extraocular motion intact. NECK: Trachea is midline, and there is no jugular venous distension.  RESPIRATORY:  Lungs are clear, and breath sounds are equal bilaterally. Normal respiratory effort without pathologic use of accessory muscles. CARDIOVASCULAR: Heart is regular without murmurs, gallops, or rubs. GI: The abdomen is soft, non-distended, with some discomfort to palpation in the epigastric region at the site of her hernia.  This is is also at the site of an epigastric scar likely from her cholecystectomy.  The hernia itself is soft, and is reducible with some manipulation.  The hernia defect is about 3.5 cm wide.  MUSCULOSKELETAL:  Normal muscle strength and tone in all four extremities.  No peripheral edema or cyanosis. SKIN: Skin turgor is normal. There are no pathologic skin lesions.  NEUROLOGIC:  Motor and sensation is grossly normal.  Cranial nerves are grossly intact. PSYCH:  Alert and oriented to person, place and time. Affect is normal.  Laboratory Analysis: Labs from 04/06/22: Na 139, K 4.5, Cl 103, CO2 30, BUN 23, Cr 1.19.  LFTs within normal.  WBC 7.7, Hgb 11.2, Hct 33.7, Plt 290  Imaging: CT chest on 05/11/22: FINDINGS: Cardiovascular: The heart is normal in size. No pericardial effusion.   No evidence of thoracic aortic aneurysm. Atherosclerotic calcifications of the aortic arch.   Mediastinum/Nodes: 7 mm short axis low right paratracheal node, within normal limits.   3.7 cm left thyroid nodule (series 2/image 14). In the setting of significant comorbidities or limited life expectancy, no follow-up recommended (ref: J Am Coll Radiol. 2015 Feb;12(2): 143-50).   Lungs/Pleura: Mild centrilobular and paraseptal emphysematous changes, upper  lung predominant.   Mild biapical pleural-parenchymal scarring.   Suspected radiation changes in the left upper lobe, new/progressive, including nodular soft tissue in the medial left upper lobe (series 3/image 27). While this is new from the prior, this is a reasonable time course to be related to radiation changes, although warranting attention on follow-up.   Underlying 14 x 19 mm nodule in the left upper lobe (series 3/image 26), previously 17 x 19 mm, stable versus mildly improved. This was non FDG avid on prior PET and favors treated neoplasm.   No new/suspicious pulmonary nodules.   No focal consolidation.   No pleural effusion or pneumothorax.   Upper Abdomen: Visualized upper abdomen is grossly unremarkable, noting vascular calcifications and a moderate midline anterior ventral hernia (series 2/image 145).   Musculoskeletal: Degenerative changes of the visualized thoracolumbar spine. Healing left lateral 3rd rib fracture (series 2/image 35), likely pathologic in the setting of radiation therapy.   IMPRESSION: Suspected radiation changes in the left upper lobe, new/progressive, as above. Attention on follow-up is suggested.   Underlying improving left upper lobe nodule, favoring treated neoplasm.   Healing left lateral 3rd rib fracture, likely pathologic in the setting of radiation therapy.   Aortic Atherosclerosis (ICD10-I70.0) and Emphysema (ICD10-J43.9).  Assessment and Plan: This is a 82 y.o. female with a likely incisional ventral hernia  --Discussed with the patient the findings on her CT scan and on exam today. She has a hernia in the epigastric region, at the site of a prior laparoscopic port.  This may be incisional from her cholecystectomy, though could possibly be incisional while doing reconstruction from her right mastectomy.  Nonetheless, her hernia on exam and CT scan is fat containing and there is no evidence of strangulation.  Discussed with her that  given the increasing size and symptoms, we can offer repair  of her hernia. --Discussed with her the plan for a robotic assisted ventral hernia repair and reviewed the surgery at length with her including the planned incisions, the risks of bleeding, infection, injury to surrounding structures, the use of mesh to reinforce the repair, that this would be an outpatient surgery, post-operative activity restrictions, pain control, and she's willing to proceed. --The patient currently takes Cosentyx for psoriatic arthritis.  She reports her dose was around July 7th.  Discussed with her having to be off the medication for 5 weeks prior to doing her surgery.  She will skip her August dose. --Will schedule her surgery for 08/16/22.  Will also send for medical clearance.  All of her questions have been answered.  I spent 55 minutes dedicated to the care of this patient on the date of this encounter to include pre-visit review of records, face-to-face time with the patient discussing diagnosis and management, and any post-visit coordination of care.   Howie Ill, MD [facility]

## 2022-07-26 ENCOUNTER — Telehealth: Payer: Self-pay | Admitting: Surgery

## 2022-07-26 ENCOUNTER — Telehealth: Payer: Self-pay | Admitting: *Deleted

## 2022-07-26 NOTE — Telephone Encounter (Signed)
Per Dr. Milinda Antis: Please schedule surgical clearance appt with pt. We received surgical clearance forms from [facility]

## 2022-07-26 NOTE — Telephone Encounter (Signed)
Patient has been advised of Pre-Admission date/time, and Surgery date at [facility].  Surgery Date: 08/16/22 Preadmission Testing Date: 08/08/22 (phone 1p-4p)  Patient has been made aware to call [phone], between 1-3:00pm the day before surgery, to find out what time to arrive for surgery.

## 2022-07-26 NOTE — Telephone Encounter (Signed)
Spoke to pt, scheduled ov for 7/26

## 2022-07-28 DIAGNOSIS — N1831 Chronic kidney disease, stage 3a: Secondary | ICD-10-CM | POA: Diagnosis not present

## 2022-07-28 DIAGNOSIS — I129 Hypertensive chronic kidney disease with stage 1 through stage 4 chronic kidney disease, or unspecified chronic kidney disease: Secondary | ICD-10-CM | POA: Diagnosis not present

## 2022-07-28 DIAGNOSIS — D630 Anemia in neoplastic disease: Secondary | ICD-10-CM | POA: Diagnosis not present

## 2022-07-28 DIAGNOSIS — L4050 Arthropathic psoriasis, unspecified: Secondary | ICD-10-CM | POA: Diagnosis not present

## 2022-07-28 DIAGNOSIS — C801 Malignant (primary) neoplasm, unspecified: Secondary | ICD-10-CM | POA: Diagnosis not present

## 2022-07-29 ENCOUNTER — Encounter: Payer: Self-pay | Admitting: Family Medicine

## 2022-07-29 ENCOUNTER — Ambulatory Visit (INDEPENDENT_AMBULATORY_CARE_PROVIDER_SITE_OTHER): Payer: HMO | Admitting: Family Medicine

## 2022-07-29 VITALS — BP 120/68 | HR 57 | Temp 98.6°F | Ht 60.0 in | Wt 134.0 lb

## 2022-07-29 DIAGNOSIS — Z01818 Encounter for other preprocedural examination: Secondary | ICD-10-CM

## 2022-07-29 DIAGNOSIS — N1832 Chronic kidney disease, stage 3b: Secondary | ICD-10-CM

## 2022-07-29 DIAGNOSIS — J441 Chronic obstructive pulmonary disease with (acute) exacerbation: Secondary | ICD-10-CM | POA: Diagnosis not present

## 2022-07-29 DIAGNOSIS — L4050 Arthropathic psoriasis, unspecified: Secondary | ICD-10-CM | POA: Diagnosis not present

## 2022-07-29 DIAGNOSIS — I10 Essential (primary) hypertension: Secondary | ICD-10-CM

## 2022-07-29 DIAGNOSIS — K439 Ventral hernia 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 gangrene: Secondary | ICD-10-CM | POA: Diagnosis not present

## 2022-07-29 DIAGNOSIS — N2889 Other specified disorders of kidney and ureter: Secondary | ICD-10-CM

## 2022-07-29 NOTE — Patient Instructions (Addendum)
I want to get a surgical clearance from your pulmonary doctor   From a medical perspective I feel you are medium risk for surgery and are optimized for surgery   Hold losartan the day of surgery based on the nephrology recommendation   Take care of yourself

## 2022-07-29 NOTE — Progress Notes (Unsigned)
Subjective:    Patient ID: Pamela Morales, female    DOB: 10-18-1940, 82 y.o.   MRN: [id number]  HPI  Wt Readings from Last 3 Encounters:  07/29/22 134 lb (60.8 kg)  07/25/22 133 lb (60.3 kg)  05/26/22 135 lb 9.6 oz (61.5 kg)   26.17 kg/m  Vitals:   07/29/22 1352  BP: 120/68  Pulse: (!) 57  Temp: 98.6 F (37 C)  SpO2: 98%    Pt presents for medical clearance for upcoming surgery of ventral/abd hernia with general anesthesia  Robotic assistted surgery with Dr Aleen Campi scheduled 8/13    She has a history of multiple surgeries in past incl ccy and hysterectomy. ,mastectomy  and bladder tack Former smoker = 20 pack years and quit in 1992 Also lung cancer  History of copd -uses trelegy ellipta inhaler   Feels stable  Breathing is good with trelegy and it helps  Has been exercising regularly - 90 minutes at gym 40 min on elliptical  Some treadmill - 2 or more miles Weights and machines   No heart problems No chest pain or new shortness of breath or ankle swelling    She has history of aneth rxn in past- lost memory after general anesthesia with septum repair years ago  She thinks it was phenobarbital  No problems since then    Takes cosentyx for psoriatic arthritis  Will have to hold 5 weeks priro and during surgery     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     HTN bp is stable today  No cp or palpitations or headaches or edema  No side effects to medicines  BP Readings from Last 3 Encounters:  07/29/22 120/68  07/25/22 (!) 139/56  05/26/22 (!) 146/73    Well controlled  Amlodipine 10 mg daily losartan 50 mg daily   Lab Results  Component Value Date   NA 139 04/06/2022   K 4.5 04/06/2022   CO2 30 04/06/2022   GLUCOSE 87 04/06/2022   BUN 23 04/06/2022   CREATININE 1.19 04/06/2022   CALCIUM 9.1 04/06/2022   GFR 42.45 (L) 04/06/2022   GFRNONAA 39 (L) 05/20/2021   Cr 1.58 on 6/17 with nephrology  C[facility]  Lab Results   Component Value Date   ALT 20 04/06/2022   AST 25 04/06/2022   ALKPHOS 91 04/06/2022   BILITOT 0.3 04/06/2022   Lab Results  Component Value Date   WBC 7.7 04/06/2022   HGB 11.2 (L) 04/06/2022   HCT 33.7 (L) 04/06/2022   MCV 83.1 04/06/2022   PLT 290.0 04/06/2022   Takes oral iron  Also on rheum meds   Lab Results  Component Value Date   IRON 75 04/06/2022   FERRITIN 370.4 (H) 04/06/2022   Had recent labs also from   No anti coagulation   EKG today  Sinus bradycardia with occational PAC Rate of 55   Will have a preoperative visit as well      Patient Active Problem List   Diagnosis Date Noted   Pre-operative clearance 07/30/2022   Hernia, ventral 04/06/2022   Pain of left scapula 04/06/2022   Primary localized osteoarthrosis of multiple sites 08/11/2021   Other long term (current) drug therapy 08/11/2021   Genetic testing 07/07/2021   Squamous cell carcinoma of left lung (HCC) 06/18/2021   Personal history of ovarian cancer 06/15/2021   Family history of melanoma 06/15/2021   Family history of stomach cancer 06/15/2021  Mass of left lung 05/14/2021   CRI (chronic renal insufficiency) 10/23/2020   Left foot pain 01/27/2020   High triglycerides 07/16/2019   COPD (chronic obstructive pulmonary disease) (HCC) 07/12/2018   Elevated TSH 10/18/2016   Elevated serum creatinine 10/18/2016   Estrogen deficiency 03/23/2016   Psoriatic arthritis (HCC) 03/31/2015   Heartburn 03/03/2015   Fatigue 04/28/2014   Family history of colon cancer 01/01/2013   Personal history of colonic polyps 01/01/2013   Anemia 12/19/2012   Hearing loss 09/19/2012   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   BACK PAIN WITH RADICULOPATHY 03/15/2007   History of herpes zoster 02/23/2007   Essential hypertension 02/05/2007   Osteopenia 02/05/2007   ECZEMA, ATOPIC 01/29/2007   ROSACEA 01/29/2007   BASAL CELL CARCINOMA, HX OF 01/29/2007   Past Medical History:  Diagnosis  Date   Allergy    Anemia    PAST HX    Anginal pain (HCC)    Breast cancer (HCC)    C1 cervical fracture (HCC)    placed in c-collar-to f/u with neurosurgery on 06-21-21 in [place]   [place] - cancer of ovary    Cancer (HCC) 02/2008   R breast  (est rec neg and brac neg)   Cataract    FORMING    Complication of anesthesia    deviated septum-lost memory for about 1 week after anesthesia   COPD (chronic obstructive pulmonary disease) (HCC)    DDD (degenerative disc disease)    in back with chronic pain   Depression    Diverticulosis    Dyspnea    with exertion   Eczema    Family history of colon cancer    GERD (gastroesophageal reflux disease)    Headache    h/o migraines   Hypertension    Mass of left lung    Osteopenia    Rosacea    Zoster 02/2007   Past Surgical History:  Procedure Laterality Date   ABDOMINAL HYSTERECTOMY  1996   BSO fibroids, incidental ovarian CA finding   BASAL CELL CARCINOMA EXCISION  09/2016   bladder tack     BREAST SURGERY     rt total mastectomy for breast CA   CHOLECYSTECTOMY  08/1996   10 yrs ago   COLONOSCOPY     lipoma removal  11/2005   NASAL SEPTUM SURGERY     POLYPECTOMY     VIDEO BRONCHOSCOPY WITH ENDOBRONCHIAL ULTRASOUND Left 06/16/2021   Procedure: POSSIBLE VIDEO BRONCHOSCOPY WITH ENDOBRONCHIAL ULTRASOUND;  Surgeon: Salena Saner, MD;  Location: [facility];  Service: Pulmonary;  Laterality: Left;   Social History   Tobacco Use   Smoking status: Former    Current packs/day: 0.00    Average packs/day: 2.0 packs/day for 10.0 years (20.0 ttl pk-yrs)    Types: Cigarettes    Start date: 01/04/1980    Quit date: 01/03/1990    Years since quitting: 32.5    Passive exposure: Past   Smokeless tobacco: Never  Vaping Use   Vaping status: Never Used  Substance Use Topics   Alcohol use: Yes    Alcohol/week: 5.0 standard drinks of alcohol    Types: 5 Glasses of wine per week    Comment: occ glass of wine   Drug use: No   Family  History  Problem Relation Age of Onset   Hypertension Mother    Heart disease Mother        CAD   Stomach cancer Mother 54  Lung cancer Father 64   Cancer Sister        colon, skin   Heart disease Sister 10       MI   Diabetes Sister    Colon cancer Sister    Head & neck cancer Sister 31   Lung cancer Sister    Melanoma Sister        multiple on head   Heart disease Brother        HTN and MI   Hypertension Brother    Cancer Maternal Aunt        unk type   Cancer Paternal Aunt        unk type   Cancer Paternal Uncle        unk type   Colon cancer Daughter 79   Esophageal cancer Neg Hx    Colon polyps Neg Hx    Allergies  Allergen Reactions   Sulfa Antibiotics     Pt unsure of reaction   Current Outpatient Medications on File Prior to Visit  Medication Sig Dispense Refill   amLODipine (NORVASC) 10 MG tablet TAKE 1 TABLET BY MOUTH EVERY DAY 90 tablet 1   Calcium Carbonate-Vitamin D (CALTRATE 600+D PO) Take 1,200 mg by mouth every evening.      famotidine (PEPCID) 20 MG tablet TAKE 1 TABLET BY MOUTH TWICE A DAY 180 tablet 1   Ferrous Sulfate (IRON) 325 (65 Fe) MG TABS Take 1 tablet by mouth daily.     FLUoxetine (PROZAC) 20 MG capsule Take 3 capsules (60 mg total) by mouth daily. 270 capsule 1   Fluticasone-Umeclidin-Vilant (TRELEGY ELLIPTA) 100-62.5-25 MCG/ACT AEPB Inhale 1 puff into the lungs daily. 28 each 11   losartan (COZAAR) 50 MG tablet TAKE 1 TABLET BY MOUTH EVERY DAY 90 tablet 1   MAGNESIUM PO Take 500 mg by mouth daily.     MELATONIN PO Take 10 mg by mouth at bedtime.     Multiple Vitamin (MULTIVITAMIN) tablet Take 1 tablet by mouth daily.     Multiple Vitamins-Minerals (PRESERVISION AREDS 2 PO) Take 1 capsule by mouth 2 (two) times daily.     OVER THE COUNTER MEDICATION Jet Asleep     Secukinumab, 300 MG Dose, (COSENTYX, 300 MG DOSE,) 150 MG/ML SOSY Inject 1 application as directed every 30 (thirty) days.     No current facility-administered medications on  file prior to visit.    Review of Systems  Constitutional:  Negative for activity change, appetite change, fatigue, fever and unexpected weight change.  HENT:  Negative for congestion, ear pain, rhinorrhea, sinus pressure and sore throat.   Eyes:  Negative for pain, redness and visual disturbance.  Respiratory:  Negative for cough, shortness of breath and wheezing.   Cardiovascular:  Negative for chest pain and palpitations.  Gastrointestinal:  Negative for abdominal pain, blood in stool, constipation and diarrhea.       Discomfort from ventral hernia   Endocrine: Negative for polydipsia and polyuria.  Genitourinary:  Negative for dysuria, frequency and urgency.  Musculoskeletal:  Negative for arthralgias, back pain and myalgias.  Skin:  Negative for pallor and rash.  Allergic/Immunologic: Negative for environmental allergies.  Neurological:  Negative for dizziness, syncope and headaches.  Hematological:  Negative for adenopathy. Does not bruise/bleed easily.  Psychiatric/Behavioral:  Negative for decreased concentration and dysphoric mood. The patient is not nervous/anxious.        Objective:   Physical Exam Constitutional:      General: She is not  in acute distress.    Appearance: Normal appearance. She is well-developed and normal weight. She is not ill-appearing or diaphoretic.  HENT:     Head: Normocephalic and atraumatic.     Mouth/Throat:     Mouth: Mucous membranes are moist.     Pharynx: Oropharynx is clear.  Eyes:     General: No scleral icterus.    Conjunctiva/sclera: Conjunctivae normal.     Pupils: Pupils are equal, round, and reactive to light.  Neck:     Thyroid: No thyromegaly.     Vascular: No carotid bruit or JVD.  Cardiovascular:     Rate and Rhythm: Regular rhythm. Bradycardia present.     Heart sounds: Normal heart sounds.     No gallop.  Pulmonary:     Effort: Pulmonary effort is normal. No respiratory distress.     Breath sounds: Normal breath  sounds. No stridor. No wheezing, rhonchi or rales.     Comments: Bs are mildly distant No wheezes   Chest:     Chest wall: No tenderness.  Abdominal:     General: There is no distension or abdominal bruit.     Palpations: Abdomen is soft.     Hernia: A hernia is present.     Comments: Right sided ventral hernia  Not tender  Soft   Musculoskeletal:     Cervical back: Normal range of motion and neck supple.     Right lower leg: No edema.     Left lower leg: No edema.  Lymphadenopathy:     Cervical: No cervical adenopathy.  Skin:    General: Skin is warm and dry.     Coloration: Skin is not jaundiced or pale.     Findings: No bruising or rash.  Neurological:     Mental Status: She is alert.     Motor: No weakness.     Coordination: Coordination normal.     Gait: Gait normal.     Deep Tendon Reflexes: Reflexes are normal and symmetric. Reflexes normal.  Psychiatric:        Mood and Affect: Mood normal.           Assessment & Plan:   Problem List Items Addressed This Visit       Cardiovascular and Mediastinum   Essential hypertension    bp in fair control at this time  BP Readings from Last 1 Encounters:  07/29/22 120/68   No changes needed Most recent labs reviewed  Disc lifstyle change with low sodium diet and exercise  Continues amlodipine 10 mg daily  Losartan 50 mg daily (per pt -nephrology instructed her to hold losartan for upcoming hernia surgery)         Respiratory   COPD (chronic obstructive pulmonary disease) (HCC)    Pt is well controlled  20 pack y history / quit in 1990s and has had lung cancer in past  Uses trelegy inhaler  Will need pulmonary clearance for upcoming hernia surgery        Musculoskeletal and Integument   Psoriatic arthritis (HCC)    Pt was instructed to hold cosentyx for upcoming hernia surgery        Genitourinary   CRI (chronic renal insufficiency)    Per pt= will need to hold losartan for upcoming herina surgery   Cr 1.58 on 6/17 at nephrology office        Other   Pre-operative clearance - Primary    82 yo with well controlled chronic medical  problems who is very physically fit  Will need clearance from pulmonary for well controlled copd (uses trelegy) Planning vertral hernia surgery with gen anesthesia  Will need to hold coxentyx (takes for psoriatic arthritis)  No history of cardiac problems  Allergic to sulfa antibiotic, also had rxn to phenobarbitol in the past  Multiple past surgeries w/o complications Not on anti coagulation  Is able to exercise for more than 90 minutes at the time (cardio and weights at gym) on a regular basis Normal exam and reassuring EKG Is cleared medically        Hernia, ventral    Planning surgery with general anesthesia Has become more painful and difficult to reduce  ER precautions noted       Other Visit Diagnoses     Pre-op testing       Relevant Orders   EKG 12-Lead (Completed)

## 2022-07-30 DIAGNOSIS — Z01818 Encounter for other preprocedural examination: Secondary | ICD-10-CM | POA: Insufficient documentation

## 2022-07-30 NOTE — Assessment & Plan Note (Signed)
Pt was instructed to hold cosentyx for upcoming hernia surgery

## 2022-07-30 NOTE — Assessment & Plan Note (Signed)
Planning surgery with general anesthesia Has become more painful and difficult to reduce  ER precautions noted

## 2022-07-30 NOTE — Assessment & Plan Note (Signed)
Per pt= will need to hold losartan for upcoming herina surgery  Cr 1.58 on 6/17 at nephrology office

## 2022-07-30 NOTE — Assessment & Plan Note (Signed)
82 yo with well controlled chronic medical problems who is very physically fit  Will need clearance from pulmonary for well controlled copd (uses trelegy) Planning vertral hernia surgery with gen anesthesia  Will need to hold coxentyx (takes for psoriatic arthritis)  No history of cardiac problems  Allergic to sulfa antibiotic, also had rxn to phenobarbitol in the past  Multiple past surgeries w/o complications Not on anti coagulation  Is able to exercise for more than 90 minutes at the time (cardio and weights at gym) on a regular basis Normal exam and reassuring EKG Is cleared medically

## 2022-07-30 NOTE — Assessment & Plan Note (Signed)
Pt is well controlled  20 pack y history / quit in 1990s and has had lung cancer in past  Uses trelegy inhaler  Will need pulmonary clearance for upcoming hernia surgery

## 2022-07-30 NOTE — Assessment & Plan Note (Signed)
bp in fair control at this time  BP Readings from Last 1 Encounters:  07/29/22 120/68   No changes needed Most recent labs reviewed  Disc lifstyle change with low sodium diet and exercise  Continues amlodipine 10 mg daily  Losartan 50 mg daily (per pt -nephrology instructed her to hold losartan for upcoming hernia surgery)

## 2022-08-04 ENCOUNTER — Telehealth: Payer: Self-pay

## 2022-08-04 NOTE — Telephone Encounter (Signed)
Faxed Pulomary Clearance to Dr. Sarina Ser at [phone].

## 2022-08-04 NOTE — Telephone Encounter (Signed)
Received surgical clearance form from [facility]. Patient is scheduled for hernia repair 08/16/2022.  Patient last seen 11/2021. Spoke to patient via telephone and scheduled appt 08/10/2022 at 11:30 for surgical assessment.  Nothing further needed.

## 2022-08-04 NOTE — Telephone Encounter (Signed)
Received medical clearance from Dr. Milinda Antis. Pt's risk assessment is medium and is optimized for surgery. Dr. Milinda Antis recommends getting a pulmonary clearance due to past history of COPD and lung cancer. I will fax clearance to Dr. Jayme Cloud.

## 2022-08-06 ENCOUNTER — Other Ambulatory Visit: Payer: Self-pay | Admitting: Family Medicine

## 2022-08-06 ENCOUNTER — Other Ambulatory Visit: Payer: Self-pay | Admitting: Pulmonary Disease

## 2022-08-08 ENCOUNTER — Encounter
Admission: RE | Admit: 2022-08-08 | Discharge: 2022-08-08 | Disposition: A | Discharge status: Home / Self Care | Payer: HMO | Source: Ambulatory Visit | Attending: Surgery | Admitting: Surgery

## 2022-08-08 ENCOUNTER — Encounter: Payer: Self-pay | Admitting: Urgent Care

## 2022-08-08 DIAGNOSIS — D649 Anemia, unspecified: Secondary | ICD-10-CM | POA: Diagnosis not present

## 2022-08-08 DIAGNOSIS — Z01812 Encounter for preprocedural laboratory examination: Secondary | ICD-10-CM | POA: Diagnosis not present

## 2022-08-08 DIAGNOSIS — I10 Essential (primary) hypertension: Secondary | ICD-10-CM

## 2022-08-08 HISTORY — DX: Unspecified hearing loss, unspecified ear: H91.90

## 2022-08-08 HISTORY — DX: Pure hyperglyceridemia: E78.1

## 2022-08-08 HISTORY — DX: Migraine, unspecified, not intractable, without status migrainosus: G43.909

## 2022-08-08 HISTORY — DX: Arthropathic psoriasis, unspecified: L40.50

## 2022-08-08 HISTORY — DX: Nontoxic single thyroid nodule: E04.1

## 2022-08-08 HISTORY — DX: Other specified abnormal findings of blood chemistry: R79.89

## 2022-08-08 HISTORY — DX: Malignant neoplasm of unspecified part of left bronchus or lung: C34.92

## 2022-08-08 HISTORY — DX: Other forms of dyspnea: R06.09

## 2022-08-08 HISTORY — DX: Ventral hernia 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 gangrene: K43.9

## 2022-08-08 HISTORY — DX: Atherosclerosis of aorta: I70.0

## 2022-08-08 LAB — CBC
HCT: 33.7 % — ABNORMAL LOW (ref 36.0–46.0)
Hemoglobin: 10.9 g/dL — ABNORMAL LOW (ref 12.0–15.0)
MCH: 27 pg (ref 26.0–34.0)
MCHC: 32.3 g/dL (ref 30.0–36.0)
MCV: 83.4 fL (ref 80.0–100.0)
Platelets: 234 10*3/uL (ref 150–400)
RBC: 4.04 MIL/uL (ref 3.87–5.11)
RDW: 13.2 % (ref 11.5–15.5)
WBC: 8.7 10*3/uL (ref 4.0–10.5)
nRBC: 0 % (ref 0.0–0.2)

## 2022-08-08 LAB — BASIC METABOLIC PANEL
Anion gap: 9 (ref 5–15)
BUN: 28 mg/dL — ABNORMAL HIGH (ref 8–23)
CO2: 22 mmol/L (ref 22–32)
Calcium: 9.3 mg/dL (ref 8.9–10.3)
Chloride: 107 mmol/L (ref 98–111)
Creatinine, Ser: 1.29 mg/dL — ABNORMAL HIGH (ref 0.44–1.00)
GFR, Estimated: 41 mL/min — ABNORMAL LOW (ref >60)
Glucose, Bld: 101 mg/dL — ABNORMAL HIGH (ref 70–99)
Potassium: 3.9 mmol/L (ref 3.5–5.1)
Sodium: 138 mmol/L (ref 135–145)

## 2022-08-08 NOTE — Patient Instructions (Signed)
Your procedure is scheduled on:08-16-22 Tuesday Report to the Registration Desk on the 1st floor of the [facility].Then proceed to the 2nd floor Surgery Desk To find out your arrival time, please call [phone] between 1PM - 3PM on:08-15-22 Monday If your arrival time is 6:00 am, do not arrive before that time as the [facility] entrance doors do not open until 6:00 am.  REMEMBER: Instructions that are not followed completely may result in serious medical risk, up to and including death; or upon the discretion of your surgeon and anesthesiologist your surgery may need to be rescheduled.  Do not eat food after midnight the night before surgery.  No gum chewing or hard candies.  You may however, drink CLEAR liquids up to 2 hours before you are scheduled to arrive for your surgery. Do not drink anything within 2 hours of your scheduled arrival time.  Clear liquids include: - water  - apple juice without pulp - gatorade (not RED colors) - black coffee or tea (Do NOT add milk or creamers to the coffee or tea) Do NOT drink anything that is not on this list.  One week prior to surgery: Stop Anti-inflammatories (NSAIDS) such as Advil, Aleve, Ibuprofen, Motrin, Naproxen, Naprosyn and Aspirin based products such as Excedrin, Goody's Powder, BC Powder.You may however, take Tylenol if needed for pain up until the day of surgery. Stop ANY OVER THE COUNTER supplements/vitamins NOW (08-08-22) until after surgery (Calcium + Vitamin D, Ferrous Sulfate, Magnesium, Multivitamin, Preservision AREDS, Jet Asleep)-You may continue your Melatonin up until the night prior to surgery   Continue taking all prescribed medications   TAKE ONLY THESE MEDICATIONS THE MORNING OF SURGERY WITH A SIP OF WATER: -famotidine (PEPCID)  -FLUoxetine (PROZAC)   Use your Trelegy Ellipta Inhaler the day of surgery  No Alcohol for 24 hours before or after surgery.  No Smoking including e-cigarettes for 24 hours before  surgery.  No chewable tobacco products for at least 6 hours before surgery.  No nicotine patches on the day of surgery.  Do not use any "recreational" drugs for at least a week (preferably 2 weeks) before your surgery.  Please be advised that the combination of cocaine and anesthesia may have negative outcomes, up to and including death. If you test positive for cocaine, your surgery will be cancelled.  On the morning of surgery brush your teeth with toothpaste and water, you may rinse your mouth with mouthwash if you wish. Do not swallow any toothpaste or mouthwash.  Use CHG Soap as directed on instruction sheet.  Do not wear jewelry, make-up, hairpins, clips or nail polish.  Do not wear lotions, powders, or perfumes.   Do not shave body hair from the neck down 48 hours before surgery.  Contact lenses, hearing aids and dentures may not be worn into surgery.  Do not bring valuables to the hospital. [facility] is not responsible for any missing/lost belongings or valuables.   Notify your doctor if there is any change in your medical condition (cold, fever, infection).  Wear comfortable clothing (specific to your surgery type) to the hospital.  After surgery, you can help prevent lung complications by doing breathing exercises.  Take deep breaths and cough every 1-2 hours. Your doctor may order a device called an Incentive Spirometer to help you take deep breaths. When coughing or sneezing, hold a pillow firmly against your incision with both hands. This is called "splinting." Doing this helps protect your incision. It also decreases belly  discomfort.  If you are being admitted to the hospital overnight, leave your suitcase in the car. After surgery it may be brought to your room.  In case of increased patient census, it may be necessary for you, the patient, to continue your postoperative care in the Same Day Surgery department.  If you are being discharged the day of surgery, you  will not be allowed to drive home. You will need a responsible individual to drive you home and stay with you for 24 hours after surgery.   If you are taking public transportation, you will need to have a responsible individual with you.  Please call the Pre-admissions Testing Dept. at [phone] if you have any questions about these instructions.  Surgery Visitation Policy:  Patients having surgery or a procedure may have two visitors.  Children under the age of 21 must have an adult with them who is not the patient.

## 2022-08-10 ENCOUNTER — Ambulatory Visit (INDEPENDENT_AMBULATORY_CARE_PROVIDER_SITE_OTHER): Payer: HMO | Admitting: Pulmonary Disease

## 2022-08-10 ENCOUNTER — Encounter: Payer: Self-pay | Admitting: Pulmonary Disease

## 2022-08-10 VITALS — BP 110/68 | HR 60 | Temp 97.7°F | Ht 60.0 in | Wt 135.0 lb

## 2022-08-10 DIAGNOSIS — Z01811 Encounter for preprocedural respiratory examination: Secondary | ICD-10-CM | POA: Diagnosis not present

## 2022-08-10 DIAGNOSIS — J449 Chronic obstructive pulmonary disease, unspecified: Secondary | ICD-10-CM | POA: Diagnosis not present

## 2022-08-10 DIAGNOSIS — C3492 Malignant neoplasm of unspecified part of left bronchus or lung: Secondary | ICD-10-CM

## 2022-08-10 DIAGNOSIS — L237 Allergic contact dermatitis due to plants, except food: Secondary | ICD-10-CM | POA: Diagnosis not present

## 2022-08-10 MED ORDER — METHYLPREDNISOLONE ACETATE 80 MG/ML IJ SUSP
80.0000 mg | Freq: Once | INTRAMUSCULAR | Status: AC
Start: 2022-08-10 — End: 2022-08-10
  Administered 2022-08-10: 80 mg via INTRAMUSCULAR

## 2022-08-10 NOTE — Patient Instructions (Addendum)
We gave you a shot for poison ivy.  You can use some calamine lotion on the affected areas.  There is a soap and other products for poison ivy under their nameTECNU that can help reducing the poison ivy oil on the skin which is what causes the rash.  Some [facility] stores and [facility] may carry that TECNU products.  You may use Zyrtec at nighttime to help with the itching and also help with managing the poison ivy.  Zyrtec is an antihistamine that can be found over-the-counter.  Continue taking Trelegy.  We will see you in follow-up in 4 months time call sooner should any new problems arise.   The following is from theTECNU website: Common Mistakes Made with Poison Ivy & [facility] Treatment The image shows a comparison between using water ("NO!") and Tecnu's all-in-one kit ("YES!") for treating poison ivy or oak rashes. Poison ivy and oak are notorious for causing severe skin reactions, thanks to the urushiol oil in their leaves, stems, and roots. Despite common knowledge about these plants, many people make mistakes in treating the rash they cause. Misconceptions about treatment can lead to prolonged discomfort, further spreading of the rash and additional skin issues. In this article, we'll fact-check several popular misconceptions about poison ivy rash treatment and share the best ways to treat poison ivy and oak rashes effectively.  Misconception 1: Using Bleach as a Treatment A popular myth suggests using bleach to treat poison ivy and oak rashes. However, bleach is a toxic chemical that can cause more harm than good. Putting bleach on your skin can lead to chemical burns, irritation, swelling, and even the destruction of melanin, the pigment in your skin. Instead of bleach, a cleanser specifically designed to remove urushiol, such as Social research officer, government, is crucial. This product is formulated to effectively eliminate the rash-causing oil without causing additional harm to your  skin.  Misconception 2: Washing with Scalding Hot Water Many believe washing a poison ivy or oak rash with hot water can provide relief. While hot water may offer temporary comfort, it opens the pores in your skin, creating a pathway for urushiol to penetrate deeper. This can exacerbate the rash and cause burns and further irritation. It's best to use cool water when washing the affected area. Cool water keeps the pores closed, preventing urushiol from entering and causing more damage. Misconception 3: Understanding How the Rash Spreads A common misunderstanding is that a spreading rash results from the initial contact with the plant. However, if your rash spreads over weeks, you will likely be re-exposed to urushiol. Urushiol can linger on surfaces, such as shoes, tools, or even your pets, for years if not properly cleaned. To prevent the rash from spreading, identify and clean all potential sources of urushiol. Tecnu Detox Wipes are highly effective for this purpose. These wipes can remove the oil from surfaces, reducing the risk of recontamination.  Misconception 4: Breaking Blisters The blisters formed during a poison ivy or oak rash are part of your body's allergic response. Contrary to popular belief, they don't contain urushiol and do not cause the rash to spread. Deliberately breaking these blisters is not advisable, as it creates an entry point for bacteria, potentially leading to skin infections. Instead, keep the blistered skin loosely covered with a bandage to protect it and allow it to heal naturally.  Proper Treatment Steps for Poison Ivy & Oak Rash The Tecnu Ivy Complete Kit is an all-in-one solution for treating poison ivy and  oak rashes. It includes everything you need to tackle the effects of these plants. Follow our simple guide to properly treat poison ivy and oak rash:  Step 1: Remove the Oil Before treating the rash symptoms, it is essential to remove the urushiol oil. Urushiol is  tough to eliminate with just soap and water. A cleanser designed specifically for this purpose, such as Tecnu Cleanse, the first step in the Tecnu Ivy Complete Kit, will yield better results. Thoroughly cleaning the affected area can reduce the rash.  Step 2: Treat the Symptoms After successfully removing the urushiol, focus on treating the rash's symptoms. Over-the-counter itch relief products can alleviate the itching, burning, and oozing associated with poison ivy and oak rashes. Tecnu Treat, a maximum-strength anti-itch gel part of the Tecnu Ivy Complete Kit, offers fast relief. Its clear formula soothes and calms the skin, providing comfort without the mess of traditional pink calamine lotion.   Step 3: Prevent Future Rashes To stay ahead of potential encounters with poison ivy or oak, use Tecnu Detox Wipes. Included in the Tecnu Ivy Complete Kit, these convenient, individually wrapped wipes are perfect for on-the-go use and do not require water. They can effectively remove urushiol from your skin, pets, and garden tools, preventing the rash from taking hold in the first place.  Treating poison ivy and oak rashes requires proper knowledge and effective products. Avoiding common mistakes, such as using bleach or hot water, and understanding the true nature of the rash can significantly improve your treatment approach. The Ivy Complete Kit offers a comprehensive solution to remove urushiol, treat symptoms, and prevent future rashes, ensuring you stay comfortable and rash-free during your outdoor activities.

## 2022-08-10 NOTE — Telephone Encounter (Signed)
Office note from today has been faxed to [facility].

## 2022-08-10 NOTE — Progress Notes (Signed)
Subjective:    Patient ID: Pamela Morales, female    DOB: January 31, 1940, 82 y.o.   MRN: [id number]  Patient Care Team: Milinda Antis, Audrie Gallus, MD as PCP - General (Family Medicine) Elmon Else, MD as Consulting Physician (Dermatology) Glenford Peers, OD as Consulting Physician (Optometry) Claud Kelp, MD as Consulting Physician (General Surgery) Glory Buff, RN as Oncology Nurse Navigator Orlie Dakin, Tollie Pizza, MD as Consulting Physician (Oncology) Kathyrn Sheriff, [facility] (Inactive) as Pharmacist (Pharmacist) Salena Saner, MD as Consulting Physician (Pulmonary Disease) Carmina Miller, MD as Consulting Physician (Radiation Oncology)  Chief Complaint  Patient presents with   Follow-up    DOE. Wheezing. No cough.   HPI Pamela Morales is an 82 year old former smoker who presents for follow-up on the issue of stage 2 COPD and preoperative evaluation for ventral hernia repair.  I last saw the patient on 15 November 2021 and at that time that time she was well compensated on Trelegy Ellipta 100, 1 inhalation once a day.  Recall the patient had robotic assisted navigational bronchoscopy on 16 June 2021 under general anesthesia for squamous cell carcinoma diagnosis.  She underwent SBRT to the left upper lobe lesion and has noted good results from that therapy.  Since her visit in November she has not had any respiratory difficulties.  She remains very active working in the garden and performing chores around the home.  He has not had any recent COPD exacerbations.  She does not endorse any cough.  She has occasional wheezing noted only when she is exerting herself gardening chores.  She rarely if ever needs use of her rescue inhaler.  She has had no chest pain, orthopnea or paroxysmal nocturnal dyspnea.  No lower extremity edema nor calf tenderness.  No sleep disturbance.  Her only complaint today was that she was gardening and got exposed to poison oak and has significant dermatitis of both of her  upper extremities and left side of the neck.  She does not endorse any other symptomatology.   Review of Systems A 10 point review of systems was performed and it is as noted above otherwise negative.   Patient Active Problem List   Diagnosis Date Noted   Pre-operative clearance 07/30/2022   Hernia, ventral 04/06/2022   Pain of left scapula 04/06/2022   Primary localized osteoarthrosis of multiple sites 08/11/2021   Other long term (current) drug therapy 08/11/2021   Genetic testing 07/07/2021   Squamous cell carcinoma of left lung (HCC) 06/18/2021   Personal history of ovarian cancer 06/15/2021   Family history of melanoma 06/15/2021   Family history of stomach cancer 06/15/2021   Mass of left lung 05/14/2021   CRI (chronic renal insufficiency) 10/23/2020   Left foot pain 01/27/2020   High triglycerides 07/16/2019   COPD (chronic obstructive pulmonary disease) (HCC) 07/12/2018   Elevated TSH 10/18/2016   Elevated serum creatinine 10/18/2016   Estrogen deficiency 03/23/2016   Psoriatic arthritis (HCC) 03/31/2015   Heartburn 03/03/2015   Fatigue 04/28/2014   Family history of colon cancer 01/01/2013   Personal history of colonic polyps 01/01/2013   Anemia 12/19/2012   Hearing loss 09/19/2012   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   BACK PAIN WITH RADICULOPATHY 03/15/2007   History of herpes zoster 02/23/2007   Essential hypertension 02/05/2007   Osteopenia 02/05/2007   ECZEMA, ATOPIC 01/29/2007   ROSACEA 01/29/2007   BASAL CELL CARCINOMA, HX OF 01/29/2007    Social History   Tobacco Use  Smoking status: Former    Current packs/day: 0.00    Average packs/day: 2.0 packs/day for 10.0 years (20.0 ttl pk-yrs)    Types: Cigarettes    Start date: 01/04/1980    Quit date: 01/03/1990    Years since quitting: 32.6    Passive exposure: Past   Smokeless tobacco: Never  Substance Use Topics   Alcohol use: Not Currently    Alcohol/week: 5.0 standard drinks of  alcohol    Types: 5 Glasses of wine per week    Allergies  Allergen Reactions   Sulfa Antibiotics     Pt unsure of reaction    Current Meds  Medication Sig   amLODipine (NORVASC) 10 MG tablet TAKE 1 TABLET BY MOUTH EVERY DAY (Patient taking differently: Take 10 mg by mouth at bedtime.)   Calcium Carbonate-Vitamin D (CALTRATE 600+D PO) Take 1,200 mg by mouth every evening.    famotidine (PEPCID) 20 MG tablet TAKE 1 TABLET BY MOUTH TWICE A DAY (Patient taking differently: Take 20 mg by mouth at bedtime.)   FLUoxetine (PROZAC) 20 MG capsule Take 3 capsules (60 mg total) by mouth daily. (Patient taking differently: Take 40 mg by mouth every morning.)   losartan (COZAAR) 50 MG tablet TAKE 1 TABLET BY MOUTH EVERY DAY (Patient taking differently: Take 50 mg by mouth every morning.)   MAGNESIUM PO Take 500 mg by mouth daily.   MELATONIN PO Take 10 mg by mouth at bedtime.   Multiple Vitamin (MULTIVITAMIN) tablet Take 1 tablet by mouth daily.   Multiple Vitamins-Minerals (PRESERVISION AREDS 2 PO) Take 1 capsule by mouth 2 (two) times daily.   OVER THE COUNTER MEDICATION Take 1 tablet by mouth daily. Jet Asleep   Secukinumab, 300 MG Dose, (COSENTYX, 300 MG DOSE,) 150 MG/ML SOSY Inject 1 application  as directed every 30 (thirty) days.   TRELEGY ELLIPTA 100-62.5-25 MCG/ACT AEPB INHALE 1 PUFF INTO THE LUNGS DAILY (Patient taking differently: Inhale 1 puff into the lungs every morning.)    Immunization History  Administered Date(s) Administered   COVID-19, mRNA, vaccine(Comirnaty)12 years and older 10/24/2021   Fluad Quad(high Dose 65+) 01/01/2020   Influenza Split 10/11/2011   Influenza Whole 10/04/2006   Influenza, High Dose Seasonal PF 10/12/2016, 10/30/2017, 10/04/2018, 01/01/2020, 09/28/2021   Influenza,inj,Quad PF,6+ Mos 09/19/2012, 10/31/2013, 10/10/2014   Influenza-Unspecified 09/18/2015   Moderna Sars-Covid-2 Vaccination 01/29/2019, 02/26/2019, 11/23/2019   Pneumococcal Conjugate-13  04/28/2014   Pneumococcal Polysaccharide-23 08/03/2012   Respiratory Syncytial Virus Vaccine,Recomb Aduvanted(Arexvy) 01/19/2022   Td 01/03/1994, 07/17/2008, 12/10/2018   Zoster Recombinant(Shingrix) 03/25/2021, 08/23/2021        Objective:     BP 110/68 (BP Location: Left Arm, Cuff Size: Normal)   Pulse 60   Temp 97.7 F (36.5 C)   Ht 5' (1.524 m)   Wt 135 lb (61.2 kg)   SpO2 97%   BMI 26.37 kg/m   SpO2: 97 % O2 Device: None (Room air)  GENERAL: Well-developed well-nourished elderly woman, no acute distress.  Fully ambulatory, no conversational dyspnea.  Quite spry.  HEAD: Normocephalic, atraumatic.  EYES: Pupils equal, round, reactive to light.  No scleral icterus.  MOUTH: Oral mucosa moist.  No thrush. NECK: Supple. No thyromegaly. Trachea midline. No JVD.  No adenopathy. PULMONARY: Good air entry bilaterally.  Coarse, otherwise no adventitious sounds. CARDIOVASCULAR: S1 and S2. Regular rate and rhythm.  No rubs, murmurs or gallops heard. ABDOMEN: Large ventral hernia noted, reducible. MUSCULOSKELETAL: No joint deformity, no clubbing, no edema.  NEUROLOGIC: No overt  focal deficit, no gait disturbance, speech is fluent. SKIN: Intact,warm,dry.  She has poison oak dermatitis noted on the upper extremities and on left side of the neck. PSYCH: Mood and behavior normal.        Assessment & Plan:     ICD-10-CM   1. Stage 2 moderate COPD by GOLD classification [facility])  J44.9    Patient appears well compensated Procedure scheduled for 16 August 2022    2. Poison ivy dermatitis  L23.7 methylPREDNISolone acetate (DEPO-MEDROL) injection 80 mg   Depo-Medrol 80 mg IM x 1 Information on TECNU products Information on proper care of poison ivy dermatitis    3. Squamous cell carcinoma of left lung (HCC)  C34.92    No evidence of recurrence    4. Preoperative respiratory examination  Z01.811    Well compensated from a COPD standpoint Postoperative risk assessment is mild to  moderate Incentive spirometry and early mobility encouraged postop     Meds ordered this encounter  Medications   methylPREDNISolone acetate (DEPO-MEDROL) injection 80 mg     From the pulmonary standpoint the patient is well compensated.  Potential complication risk is mild to moderate given that she is well compensated.  Observe incentive spirometry and early mobilization postop as tolerated and indicated.  Continue her respiratory medications as they are (Trelegy 100, 1 inhalation daily and as needed albuterol).  Will see the patient in follow-up in 3 to 4 months time she is to contact us prior to that time should any new difficulties arise.   Gailen Shelter, MD Advanced Bronchoscopy PCCM [facility]    *This note was dictated using voice recognition software/Dragon.  Despite best efforts to proofread, errors can occur which can change the meaning. Any transcriptional errors that result from this process are unintentional and may not be fully corrected at the time of dictation.

## 2022-08-10 NOTE — Progress Notes (Unsigned)
Pulmonary Clearance has been received from Dr Jayme Cloud. All notes are in Epic. Well compensated from a COPD standpoint Postoperative risk assessment is mild to moderate.

## 2022-08-11 ENCOUNTER — Other Ambulatory Visit: Payer: Self-pay | Admitting: Family Medicine

## 2022-08-11 DIAGNOSIS — Z79899 Other long term (current) drug therapy: Secondary | ICD-10-CM | POA: Diagnosis not present

## 2022-08-11 DIAGNOSIS — R5383 Other fatigue: Secondary | ICD-10-CM | POA: Diagnosis not present

## 2022-08-11 DIAGNOSIS — Z6824 Body mass index (BMI) 24.0-24.9, adult: Secondary | ICD-10-CM | POA: Diagnosis not present

## 2022-08-11 DIAGNOSIS — L409 Psoriasis, unspecified: Secondary | ICD-10-CM | POA: Diagnosis not present

## 2022-08-11 DIAGNOSIS — M1991 Primary osteoarthritis, unspecified site: Secondary | ICD-10-CM | POA: Diagnosis not present

## 2022-08-11 DIAGNOSIS — L4050 Arthropathic psoriasis, unspecified: Secondary | ICD-10-CM | POA: Diagnosis not present

## 2022-08-15 MED ORDER — CHLORHEXIDINE GLUCONATE 0.12 % MT SOLN
15.0000 mL | Freq: Once | OROMUCOSAL | Status: AC
  Administered 2022-08-16: 15 mL via OROMUCOSAL

## 2022-08-15 MED ORDER — GABAPENTIN 100 MG PO CAPS
200.0000 mg | ORAL_CAPSULE | ORAL | Status: AC
  Administered 2022-08-16: 200 mg via ORAL

## 2022-08-15 MED ORDER — LACTATED RINGERS IV SOLN: INTRAVENOUS | Status: DC

## 2022-08-15 MED ORDER — CHLORHEXIDINE GLUCONATE CLOTH 2 % EX PADS: 6.0000 | MEDICATED_PAD | Freq: Once | CUTANEOUS | Status: DC

## 2022-08-15 MED ORDER — CEFAZOLIN SODIUM-DEXTROSE 2-4 GM/100ML-% IV SOLN
2.0000 g | INTRAVENOUS | Status: AC
  Administered 2022-08-16: 2 g via INTRAVENOUS

## 2022-08-15 MED ORDER — ACETAMINOPHEN 500 MG PO TABS
1000.0000 mg | ORAL_TABLET | ORAL | Status: AC
  Administered 2022-08-16: 1000 mg via ORAL

## 2022-08-15 MED ORDER — ORAL CARE MOUTH RINSE: 15.0000 mL | Freq: Once | OROMUCOSAL | Status: AC

## 2022-08-15 MED ORDER — BUPIVACAINE LIPOSOME 1.3 % IJ SUSP: 20.0000 mL | Freq: Once | INTRAMUSCULAR | Status: DC

## 2022-08-16 ENCOUNTER — Other Ambulatory Visit: Payer: Self-pay

## 2022-08-16 ENCOUNTER — Ambulatory Visit: Payer: HMO

## 2022-08-16 ENCOUNTER — Encounter
Admission: RE | Disposition: A | Discharge status: Home / Self Care | Payer: Self-pay | Source: Home / Self Care | Attending: Surgery

## 2022-08-16 ENCOUNTER — Ambulatory Visit
Admission: RE | Admit: 2022-08-16 | Discharge: 2022-08-16 | Disposition: A | Discharge status: Home / Self Care | Payer: HMO | Attending: Surgery | Admitting: Surgery

## 2022-08-16 ENCOUNTER — Encounter: Payer: Self-pay | Admitting: Surgery

## 2022-08-16 DIAGNOSIS — Z7951 Long term (current) use of inhaled steroids: Secondary | ICD-10-CM | POA: Diagnosis not present

## 2022-08-16 DIAGNOSIS — I10 Essential (primary) hypertension: Secondary | ICD-10-CM | POA: Diagnosis not present

## 2022-08-16 DIAGNOSIS — I700 Atherosclerosis of aorta: Secondary | ICD-10-CM | POA: Diagnosis not present

## 2022-08-16 DIAGNOSIS — Z87891 Personal history of nicotine dependence: Secondary | ICD-10-CM | POA: Insufficient documentation

## 2022-08-16 DIAGNOSIS — K432 Incisional hernia 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 gangrene: Secondary | ICD-10-CM | POA: Diagnosis not present

## 2022-08-16 DIAGNOSIS — I129 Hypertensive chronic kidney disease with stage 1 through stage 4 chronic kidney disease, or unspecified chronic kidney disease: Secondary | ICD-10-CM | POA: Diagnosis not present

## 2022-08-16 DIAGNOSIS — L4050 Arthropathic psoriasis, unspecified: Secondary | ICD-10-CM | POA: Diagnosis not present

## 2022-08-16 DIAGNOSIS — Z8543 Personal history of malignant neoplasm of ovary: Secondary | ICD-10-CM | POA: Insufficient documentation

## 2022-08-16 DIAGNOSIS — Z853 Personal history of malignant neoplasm of breast: Secondary | ICD-10-CM | POA: Insufficient documentation

## 2022-08-16 DIAGNOSIS — Z801 Family history of malignant neoplasm of trachea, bronchus and lung: Secondary | ICD-10-CM | POA: Diagnosis not present

## 2022-08-16 DIAGNOSIS — Z923 Personal history of irradiation: Secondary | ICD-10-CM | POA: Insufficient documentation

## 2022-08-16 DIAGNOSIS — Z85828 Personal history of other malignant neoplasm of skin: Secondary | ICD-10-CM | POA: Diagnosis not present

## 2022-08-16 DIAGNOSIS — D631 Anemia in chronic kidney disease: Secondary | ICD-10-CM | POA: Diagnosis not present

## 2022-08-16 DIAGNOSIS — K219 Gastro-esophageal reflux disease without esophagitis: Secondary | ICD-10-CM | POA: Insufficient documentation

## 2022-08-16 DIAGNOSIS — Z800 Family history of malignant neoplasm of digestive organs: Secondary | ICD-10-CM | POA: Insufficient documentation

## 2022-08-16 DIAGNOSIS — K439 Ventral hernia 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 gangrene: Secondary | ICD-10-CM | POA: Diagnosis not present

## 2022-08-16 DIAGNOSIS — J439 Emphysema, unspecified: Secondary | ICD-10-CM | POA: Insufficient documentation

## 2022-08-16 DIAGNOSIS — N183 Chronic kidney disease, stage 3 unspecified: Secondary | ICD-10-CM | POA: Diagnosis not present

## 2022-08-16 DIAGNOSIS — Z85118 Personal history of other malignant neoplasm of bronchus and lung: Secondary | ICD-10-CM | POA: Insufficient documentation

## 2022-08-16 HISTORY — PX: XI ROBOTIC ASSISTED VENTRAL HERNIA: SHX6789

## 2022-08-16 SURGERY — REPAIR, HERNIA, VENTRAL, ROBOT-ASSISTED
Anesthesia: General | Site: Abdomen

## 2022-08-16 MED ORDER — ROCURONIUM BROMIDE 10 MG/ML (PF) SYRINGE
PREFILLED_SYRINGE | INTRAVENOUS | Status: AC
  Filled 2022-08-16: qty 10

## 2022-08-16 MED ORDER — OXYCODONE HCL 5 MG PO TABS: 5.0000 mg | ORAL_TABLET | Freq: Four times a day (QID) | ORAL | 0 refills | Status: DC | PRN

## 2022-08-16 MED ORDER — EPHEDRINE SULFATE (PRESSORS) 50 MG/ML IJ SOLN
INTRAMUSCULAR | Status: DC | PRN
  Administered 2022-08-16: 5 mg via INTRAVENOUS

## 2022-08-16 MED ORDER — FENTANYL CITRATE (PF) 100 MCG/2ML IJ SOLN
INTRAMUSCULAR | Status: AC
  Filled 2022-08-16: qty 2

## 2022-08-16 MED ORDER — OXYCODONE HCL 5 MG PO TABS
5.0000 mg | ORAL_TABLET | Freq: Once | ORAL | Status: AC | PRN
  Administered 2022-08-16: 5 mg via ORAL

## 2022-08-16 MED ORDER — SUGAMMADEX SODIUM 200 MG/2ML IV SOLN
INTRAVENOUS | Status: DC | PRN
  Administered 2022-08-16: 200 mg via INTRAVENOUS

## 2022-08-16 MED ORDER — KETOROLAC TROMETHAMINE 15 MG/ML IJ SOLN
15.0000 mg | INTRAMUSCULAR | Status: AC
  Administered 2022-08-16: 15 mg via INTRAVENOUS

## 2022-08-16 MED ORDER — SODIUM CHLORIDE FLUSH 0.9 % IV SOLN
INTRAVENOUS | Status: AC
  Filled 2022-08-16: qty 10

## 2022-08-16 MED ORDER — BUPIVACAINE-EPINEPHRINE (PF) 0.5% -1:200000 IJ SOLN
INTRAMUSCULAR | Status: AC
  Filled 2022-08-16: qty 20

## 2022-08-16 MED ORDER — ROCURONIUM BROMIDE 100 MG/10ML IV SOLN
INTRAVENOUS | Status: DC | PRN
  Administered 2022-08-16: 20 mg via INTRAVENOUS
  Administered 2022-08-16: 30 mg via INTRAVENOUS
  Administered 2022-08-16 (×2): 10 mg via INTRAVENOUS
  Administered 2022-08-16: 20 mg via INTRAVENOUS

## 2022-08-16 MED ORDER — 0.9 % SODIUM CHLORIDE (POUR BTL) OPTIME
TOPICAL | Status: DC | PRN
  Administered 2022-08-16: 500 mL

## 2022-08-16 MED ORDER — CEFAZOLIN SODIUM-DEXTROSE 2-4 GM/100ML-% IV SOLN
INTRAVENOUS | Status: AC
  Filled 2022-08-16: qty 100

## 2022-08-16 MED ORDER — PROPOFOL 10 MG/ML IV BOLUS
INTRAVENOUS | Status: DC | PRN
Start: 2022-08-16 — End: 2022-08-16
  Administered 2022-08-16: 100 mg via INTRAVENOUS

## 2022-08-16 MED ORDER — EPHEDRINE 5 MG/ML INJ
INTRAVENOUS | Status: AC
  Filled 2022-08-16: qty 5

## 2022-08-16 MED ORDER — ONDANSETRON HCL 4 MG/2ML IJ SOLN: 4.0000 mg | Freq: Once | INTRAMUSCULAR | Status: DC | PRN

## 2022-08-16 MED ORDER — FENTANYL CITRATE (PF) 100 MCG/2ML IJ SOLN
INTRAMUSCULAR | Status: DC | PRN
  Administered 2022-08-16 (×4): 25 ug via INTRAVENOUS

## 2022-08-16 MED ORDER — KETOROLAC TROMETHAMINE 15 MG/ML IJ SOLN
INTRAMUSCULAR | Status: AC
  Filled 2022-08-16: qty 1

## 2022-08-16 MED ORDER — CHLORHEXIDINE GLUCONATE 0.12 % MT SOLN
OROMUCOSAL | Status: AC
  Filled 2022-08-16: qty 15

## 2022-08-16 MED ORDER — ONDANSETRON HCL 4 MG/2ML IJ SOLN
INTRAMUSCULAR | Status: DC | PRN
Start: 2022-08-16 — End: 2022-08-16
  Administered 2022-08-16: 4 mg via INTRAVENOUS

## 2022-08-16 MED ORDER — GLYCOPYRROLATE 0.2 MG/ML IJ SOLN
INTRAMUSCULAR | Status: DC | PRN
  Administered 2022-08-16 (×2): .1 mg via INTRAVENOUS

## 2022-08-16 MED ORDER — OXYCODONE HCL 5 MG PO TABS
ORAL_TABLET | ORAL | Status: AC
  Filled 2022-08-16: qty 1

## 2022-08-16 MED ORDER — PROPOFOL 10 MG/ML IV BOLUS
INTRAVENOUS | Status: AC
  Filled 2022-08-16: qty 40

## 2022-08-16 MED ORDER — DEXAMETHASONE SODIUM PHOSPHATE 10 MG/ML IJ SOLN
INTRAMUSCULAR | Status: DC | PRN
  Administered 2022-08-16: 10 mg via INTRAVENOUS

## 2022-08-16 MED ORDER — FENTANYL CITRATE (PF) 100 MCG/2ML IJ SOLN
25.0000 ug | INTRAMUSCULAR | Status: DC | PRN
  Administered 2022-08-16 (×4): 25 ug via INTRAVENOUS

## 2022-08-16 MED ORDER — ACETAMINOPHEN 10 MG/ML IV SOLN: 1000.0000 mg | Freq: Once | INTRAVENOUS | Status: DC | PRN

## 2022-08-16 MED ORDER — DEXAMETHASONE SODIUM PHOSPHATE 10 MG/ML IJ SOLN
INTRAMUSCULAR | Status: AC
  Filled 2022-08-16: qty 1

## 2022-08-16 MED ORDER — ACETAMINOPHEN 500 MG PO TABS
ORAL_TABLET | ORAL | Status: AC
  Filled 2022-08-16: qty 2

## 2022-08-16 MED ORDER — GABAPENTIN 100 MG PO CAPS
ORAL_CAPSULE | ORAL | Status: AC
  Filled 2022-08-16: qty 2

## 2022-08-16 MED ORDER — LACTATED RINGERS IV SOLN: INTRAVENOUS | Status: DC

## 2022-08-16 MED ORDER — BUPIVACAINE LIPOSOME 1.3 % IJ SUSP
INTRAMUSCULAR | Status: AC
  Filled 2022-08-16: qty 20

## 2022-08-16 MED ORDER — OXYCODONE HCL 5 MG/5ML PO SOLN: 5.0000 mg | Freq: Once | ORAL | Status: AC | PRN

## 2022-08-16 MED ORDER — PHENYLEPHRINE HCL-NACL 20-0.9 MG/250ML-% IV SOLN
INTRAVENOUS | Status: AC
  Filled 2022-08-16: qty 250

## 2022-08-16 MED ORDER — LIDOCAINE HCL (CARDIAC) PF 100 MG/5ML IV SOSY
PREFILLED_SYRINGE | INTRAVENOUS | Status: DC | PRN
  Administered 2022-08-16: 60 mg via INTRAVENOUS

## 2022-08-16 MED ORDER — BUPIVACAINE-EPINEPHRINE (PF) 0.5% -1:200000 IJ SOLN
INTRAMUSCULAR | Status: DC | PRN
  Administered 2022-08-16: 60 mL

## 2022-08-16 MED ORDER — IBUPROFEN 200 MG PO TABS: 400.0000 mg | ORAL_TABLET | Freq: Four times a day (QID) | ORAL | Status: DC | PRN

## 2022-08-16 MED ORDER — LIDOCAINE HCL (PF) 2 % IJ SOLN
INTRAMUSCULAR | Status: AC
  Filled 2022-08-16: qty 10

## 2022-08-16 MED ORDER — ONDANSETRON HCL 4 MG/2ML IJ SOLN
INTRAMUSCULAR | Status: AC
  Filled 2022-08-16: qty 2

## 2022-08-16 MED ORDER — BUPIVACAINE-EPINEPHRINE (PF) 0.5% -1:200000 IJ SOLN
INTRAMUSCULAR | Status: AC
  Filled 2022-08-16: qty 10

## 2022-08-16 MED ORDER — GLYCOPYRROLATE 0.2 MG/ML IJ SOLN
INTRAMUSCULAR | Status: AC
  Filled 2022-08-16: qty 1

## 2022-08-16 MED ORDER — ACETAMINOPHEN 500 MG PO TABS: 1000.0000 mg | ORAL_TABLET | Freq: Four times a day (QID) | ORAL | Status: AC | PRN

## 2022-08-16 SURGICAL SUPPLY — 60 items
ADH SKN CLS APL DERMABOND .7 (GAUZE/BANDAGES/DRESSINGS) ×1
BLADE SURG SZ11 CARB STEEL (BLADE) ×2 IMPLANT
CANNULA CAP OBTURATR AIRSEAL 8 (CAP) IMPLANT
COVER TIP SHEARS 8 DVNC (MISCELLANEOUS) ×2 IMPLANT
COVER WAND RF STERILE (DRAPES) ×2 IMPLANT
DERMABOND ADVANCED .7 DNX12 (GAUZE/BANDAGES/DRESSINGS) ×2 IMPLANT
DRAPE ARM DVNC X/XI (DISPOSABLE) ×6 IMPLANT
DRAPE COLUMN DVNC XI (DISPOSABLE) ×2 IMPLANT
ELECT CAUTERY BLADE TIP 2.5 (TIP) ×1
ELECT REM PT RETURN 9FT ADLT (ELECTROSURGICAL) ×1
ELECTRODE CAUTERY BLDE TIP 2.5 (TIP) ×2 IMPLANT
ELECTRODE REM PT RTRN 9FT ADLT (ELECTROSURGICAL) ×2 IMPLANT
FORCEPS BPLR R/ABLATION 8 DVNC (INSTRUMENTS) ×2 IMPLANT
GLOVE SURG SYN 7.0 (GLOVE) ×2 IMPLANT
GLOVE SURG SYN 7.0 PF PI (GLOVE) ×4 IMPLANT
GLOVE SURG SYN 7.5 E (GLOVE) ×2 IMPLANT
GLOVE SURG SYN 7.5 PF PI (GLOVE) ×4 IMPLANT
GOWN STRL REUS W/ TWL LRG LVL3 (GOWN DISPOSABLE) ×6 IMPLANT
GOWN STRL REUS W/TWL LRG LVL3 (GOWN DISPOSABLE) ×3
GRASPER SUT TROCAR 14GX15 (MISCELLANEOUS) ×2 IMPLANT
IRRIGATION STRYKERFLOW (MISCELLANEOUS) IMPLANT
IRRIGATOR STRYKERFLOW (MISCELLANEOUS)
IV NS 1000ML (IV SOLUTION)
IV NS 1000ML BAXH (IV SOLUTION) IMPLANT
KIT PINK PAD W/HEAD ARE REST (MISCELLANEOUS) ×1
KIT PINK PAD W/HEAD ARM REST (MISCELLANEOUS) ×2 IMPLANT
LABEL OR SOLS (LABEL) ×2 IMPLANT
MANIFOLD NEPTUNE II (INSTRUMENTS) ×2 IMPLANT
MESH VENT LT ST 15CM CRL ECHO2 (Mesh General) IMPLANT
MESH VENTRALIGHT ST 4.5 ECHO (Mesh General) IMPLANT
NDL DRIVE SUT CUT DVNC (INSTRUMENTS) ×2 IMPLANT
NDL HYPO 22X1.5 SAFETY MO (MISCELLANEOUS) ×2 IMPLANT
NDL INSUFFLATION 14GA 120MM (NEEDLE) ×2 IMPLANT
NEEDLE DRIVE SUT CUT DVNC (INSTRUMENTS) ×1 IMPLANT
NEEDLE HYPO 22X1.5 SAFETY MO (MISCELLANEOUS) ×1 IMPLANT
NEEDLE INSUFFLATION 14GA 120MM (NEEDLE) ×1 IMPLANT
OBTURATOR OPTICAL STND 8 DVNC (TROCAR) ×1
OBTURATOR OPTICALSTD 8 DVNC (TROCAR) ×2 IMPLANT
PACK LAP CHOLECYSTECTOMY (MISCELLANEOUS) ×2 IMPLANT
PENCIL SMOKE EVACUATOR (MISCELLANEOUS) ×2 IMPLANT
SCISSORS MNPLR CVD DVNC XI (INSTRUMENTS) ×2 IMPLANT
SEAL UNIV 5-12 XI (MISCELLANEOUS) ×4 IMPLANT
SET TUBE FILTERED XL AIRSEAL (SET/KITS/TRAYS/PACK) IMPLANT
SET TUBE SMOKE EVAC HIGH FLOW (TUBING) ×2 IMPLANT
SOL ELECTROSURG ANTI STICK (MISCELLANEOUS) ×1
SOLUTION ELECTROSURG ANTI STCK (MISCELLANEOUS) ×2 IMPLANT
SPONGE T-LAP 18X18 ~~LOC~~+RFID (SPONGE) ×2 IMPLANT
SUT MNCRL 4-0 (SUTURE) ×2
SUT MNCRL 4-0 27XMFL (SUTURE) ×2
SUT STRATAFIX PDS 30 CT-1 (SUTURE) ×2 IMPLANT
SUT VICRYL 0 UR6 27IN ABS (SUTURE) ×4 IMPLANT
SUT VLOC 90 2/L VL 12 GS22 (SUTURE) ×4 IMPLANT
SUTURE MNCRL 4-0 27XMF (SUTURE) ×2 IMPLANT
SYR 30ML LL (SYRINGE) IMPLANT
SYS BAG RETRIEVAL 10MM (BASKET) ×1
SYSTEM BAG RETRIEVAL 10MM (BASKET) IMPLANT
TAPE TRANSPORE STRL 2 31045 (GAUZE/BANDAGES/DRESSINGS) ×2 IMPLANT
TRAP FLUID SMOKE EVACUATOR (MISCELLANEOUS) ×2 IMPLANT
TRAY FOLEY SLVR 16FR LF STAT (SET/KITS/TRAYS/PACK) ×2 IMPLANT
WATER STERILE IRR 500ML POUR (IV SOLUTION) ×2 IMPLANT

## 2022-08-16 NOTE — Anesthesia Procedure Notes (Addendum)
Procedure Name: Intubation Date/Time: 08/16/2022 7:37 AM  Performed by: Allayne Butcher, RNPre-anesthesia Checklist: Patient identified, Emergency Drugs available, Suction available and Patient being monitored Patient Re-evaluated:Patient Re-evaluated prior to induction Oxygen Delivery Method: Circle system utilized Preoxygenation: Pre-oxygenation with 100% oxygen Induction Type: IV induction Ventilation: Mask ventilation without difficulty Laryngoscope Size: McGraph and 3 Grade View: Grade I Tube type: Oral Tube size: 6.5 mm Number of attempts: 1 Airway Equipment and Method: Stylet and Oral airway Placement Confirmation: ETT inserted through vocal cords under direct vision, positive ETCO2 and breath sounds checked- equal and bilateral Secured at: 19 cm Tube secured with: Tape Dental Injury: Teeth and Oropharynx as per pre-operative assessment

## 2022-08-16 NOTE — Anesthesia Postprocedure Evaluation (Signed)
Anesthesia Post Note  Patient: Pamela Morales  Procedure(s) Performed: XI ROBOTIC ASSISTED VENTRAL HERNIA (Abdomen)  Patient location during evaluation: PACU Anesthesia Type: General Level of consciousness: awake and alert, oriented and patient cooperative Pain management: pain level controlled Vital Signs Assessment: post-procedure vital signs reviewed and stable Respiratory status: spontaneous breathing, nonlabored ventilation and respiratory function stable Cardiovascular status: blood pressure returned to baseline and stable Postop Assessment: adequate PO intake Anesthetic complications: no   No notable events documented.   Last Vitals:  Vitals:   08/16/22 1030 08/16/22 1045  BP: (!) 127/58   Pulse: (!) 58 (!) 56  Resp: 15 19  Temp:    SpO2: 90% 98%    Last Pain:  Vitals:   08/16/22 1045  TempSrc:   PainSc: 5                  Reed Breech

## 2022-08-16 NOTE — Interval H&P Note (Signed)
History and Physical Interval Note:  08/16/2022 7:07 AM  Pamela Morales  has presented today for surgery, with the diagnosis of incisional ventral hernia redicible 3-10 cm.  The various methods of treatment have been discussed with the patient and family. After consideration of risks, benefits and other options for treatment, the patient has consented to  Procedure(s): XI ROBOTIC ASSISTED VENTRAL HERNIA (N/A) as a surgical intervention.  The patient's history has been reviewed, patient examined, no change in status, stable for surgery.  I have reviewed the patient's chart and labs.  Questions were answered to the patient's satisfaction.      

## 2022-08-16 NOTE — Op Note (Signed)
Procedure Date:  08/16/2022  Pre-operative Diagnosis:  Incisional ventral hernia  Post-operative Diagnosis: Incisional ventral hernia, reducible, 4 x 3 cm.  Procedure:  Robotic assisted Incisional ventral Hernia Repair with mesh  Surgeon:  Howie Ill, MD  Assistant:  Vanessa Ralphs, PA-S  Anesthesia:  General endotracheal  Estimated Blood Loss:  10 ml  Specimens:  None  Complications:  None  Indications for Procedure:  This is a 82 y.o. female who presents with an incisional epigastric ventral hernia.  The options of surgery versus observation were reviewed with the patient and/or family. The risks of bleeding, abscess or infection, recurrence of symptoms, potential for an open procedure, injury to surrounding structures, and chronic pain were all discussed with the patient and was willing to proceed.  Description of Procedure: The patient was correctly identified in the preoperative area and brought into the operating room.  The patient was placed supine with VTE prophylaxis in place.  Appropriate time-outs were performed.  Anesthesia was induced and the patient was intubated.  Appropriate antibiotics were infused.  The abdomen was prepped and draped in a sterile fashion. The patient's hernia defect was marked with a marking pen.  Based on the location of the hernia, it was decided to place the ports in the lower abdomen.  An infraumbilical incision was made at the site of her prior panniculetomy.  Cutdown technique was used to enter the abdominal cavity, and a 12 mm port was introduced in the left upper quadrant.  Pneumoperitoneum was obtained with appropriate pressures.  Then, an 8 mm port was placed in the right lower quadrant and left lower quadrant under direct visualization.  The DaVinci platform was docked, camera targeted, and instruments placed under direct visualization.  The patient's hernia was fully reduced and the peritoneum and preperitoneal fat were dissected to  allow better exposure of the hernia defect and for better mesh placement.  The hernia contained both omentum and preperitoneal fat including Falciform ligament.  A 4.5 inch circular Bard Ventralight ST Echo mesh, a 0 Stratafix suture, and two 2-0 V-loc sutures were inserted through the 12 mm port under direct visualization. The hernia defect was closed using the stratafix suture x 2 passes to help with the tension.  A PMI was brought through the center of the hernia defect and the positioning system of the mesh was passed through.  This allowed the mesh to splay open and be centered over the repair site with good overlap.  The mesh was then sutured in place circumferentially and through the center of the mesh using the V-loc sutures.  All needles and the positioning system were then removed through the 12 mm port without complications.  The preperitoneal fat was placed in an Endocatch bag.  The DaVinci platform was then undocked and instruments removed.    60 ml of Exparel solution mixed with 0.5% bupivacaine with epi was infiltrated around the mesh edges, hernia repair site, and port sites.  The 12 mm port was removed and bag retrieved.  The fascia was closed using 0 Vicryl suture.  The 8 mm ports were removed. The 12 mm incision was closed using 3-0 Vicryl and 4-0 Monocryl, and the other port incisions were closed with 4-0 Monocryl.  The wounds were cleaned and sealed with DermaBond.  The patient was emerged from anesthesia and extubated and brought to the recovery room for further management.  The patient tolerated the procedure well and all counts were correct at the end of the case.  Howie Ill, MD

## 2022-08-16 NOTE — Anesthesia Preprocedure Evaluation (Addendum)
Anesthesia Evaluation  Patient identified by MRN, date of birth, ID band Patient awake    Reviewed: Allergy & Precautions, NPO status , Patient's Chart, lab work & pertinent test results  History of Anesthesia Complications Negative for: history of anesthetic complications  Airway Mallampati: IV   Neck ROM: Full    Dental  (+) Upper Dentures, Lower Dentures   Pulmonary COPD, former smoker (quit 1992) Lung CA   Pulmonary exam normal breath sounds clear to auscultation       Cardiovascular hypertension, Normal cardiovascular exam Rhythm:Regular Rate:Normal  ECG 07/29/22: Sinus bradycardia rate of 55 with occational PAC No acute ST or T wave changes Normal PR and QT intervals No significant change from prior EKG    Neuro/Psych  PSYCHIATRIC DISORDERS  Depression    HOH    GI/Hepatic ,GERD  ,,  Endo/Other    Renal/GU Renal disease (stage III CKD)     Musculoskeletal  (+) Arthritis ,    Abdominal   Peds  Hematology  (+) Blood dyscrasia, anemia Breast CA   Anesthesia Other Findings   Reproductive/Obstetrics                             Anesthesia Physical Anesthesia Plan  ASA: 3  Anesthesia Plan: General   Post-op Pain Management:    Induction: Intravenous  PONV Risk Score and Plan: 3 and Ondansetron, Dexamethasone and Treatment may vary due to age or medical condition  Airway Management Planned: Oral ETT  Additional Equipment:   Intra-op Plan:   Post-operative Plan: Extubation in OR  Informed Consent: I have reviewed the patients History and Physical, chart, labs and discussed the procedure including the risks, benefits and alternatives for the proposed anesthesia with the patient or authorized representative who has indicated his/her understanding and acceptance.     Dental advisory given  Plan Discussed with: CRNA  Anesthesia Plan Comments: (Patient consented for risks  of anesthesia including but not limited to:  - adverse reactions to medications - damage to eyes, teeth, lips or other oral mucosa - nerve damage due to positioning  - sore throat or hoarseness - damage to heart, brain, nerves, lungs, other parts of body or loss of life  Informed patient about role of CRNA in peri- and intra-operative care.  Patient voiced understanding.)        Anesthesia Quick Evaluation

## 2022-08-16 NOTE — Transfer of Care (Signed)
Immediate Anesthesia Transfer of Care Note  Patient: Pamela Morales  Procedure(s) Performed: XI ROBOTIC ASSISTED VENTRAL HERNIA (Abdomen)  Patient Location: PACU  Anesthesia Type:General  Level of Consciousness: drowsy  Airway & Oxygen Therapy: Patient Spontanous Breathing  Post-op Assessment: Report given to RN and Post -op Vital signs reviewed and stable  Post vital signs: Reviewed  Last Vitals:  Vitals Value Taken Time  BP 140/83   Temp 98.22F   Pulse 59 08/16/22 1012  Resp 15   SpO2 100 % 08/16/22 1012  Vitals shown include unfiled device data.  Last Pain:  Vitals:   08/16/22 0629  TempSrc: Tympanic  PainSc: 0-No pain         Complications: No notable events documented.

## 2022-08-16 NOTE — Discharge Instructions (Addendum)
Discharge Instructions: 1.  Patient may shower, but do not scrub wounds heavily and dab dry only. 2.  Do not submerge wounds in pool/tub until fully healed. 3.  Do not apply ointments or hydrogen peroxide to the wounds. 4.  May apply ice packs to the wounds for comfort. 5.  Please wear abdominal binder at all times for the next 4 weeks. 6.  Do not drive while taking narcotics for pain control.  Prior to driving, make sure you are able to rotate right and left to look at blindspots without significant pain or discomfort. 7.  No heavy lifting or pushing of more than 10-15 lbs for 6 weeks.   AMBULATORY SURGERY  DISCHARGE INSTRUCTIONS   The drugs that you were given will stay in your system until tomorrow so for the next 24 hours you should not:  Drive an automobile Make any legal decisions Drink any alcoholic beverage   You may resume regular meals tomorrow.  Today it is better to start with liquids and gradually work up to solid foods.  You may eat anything you prefer, but it is better to start with liquids, then soup and crackers, and gradually work up to solid foods.   Please notify your doctor immediately if you have any unusual bleeding, trouble breathing, redness and pain at the surgery site, drainage, fever, or pain not relieved by medication.    Additional Instructions:        Please contact your physician with any problems or Same Day Surgery at [phone], Monday through Friday 6 am to 4 pm, or [place] at [facility] number at [phone].

## 2022-08-18 ENCOUNTER — Encounter (INDEPENDENT_AMBULATORY_CARE_PROVIDER_SITE_OTHER): Payer: HMO | Admitting: Ophthalmology

## 2022-08-22 ENCOUNTER — Encounter (INDEPENDENT_AMBULATORY_CARE_PROVIDER_SITE_OTHER): Payer: HMO | Admitting: Ophthalmology

## 2022-08-22 DIAGNOSIS — H43813 Vitreous degeneration, bilateral: Secondary | ICD-10-CM

## 2022-08-22 DIAGNOSIS — H34812 Central retinal vein occlusion, left eye, with macular edema: Secondary | ICD-10-CM | POA: Diagnosis not present

## 2022-08-22 DIAGNOSIS — H35033 Hypertensive retinopathy, bilateral: Secondary | ICD-10-CM

## 2022-08-22 DIAGNOSIS — I10 Essential (primary) hypertension: Secondary | ICD-10-CM | POA: Diagnosis not present

## 2022-08-22 DIAGNOSIS — H353131 Nonexudative age-related macular degeneration, bilateral, early dry stage: Secondary | ICD-10-CM

## 2022-09-01 ENCOUNTER — Encounter: Payer: HMO | Admitting: Physician Assistant

## 2022-09-01 DIAGNOSIS — Z808 Family history of malignant neoplasm of other organs or systems: Secondary | ICD-10-CM | POA: Diagnosis not present

## 2022-09-01 DIAGNOSIS — D2272 Melanocytic nevi of left lower limb, including hip: Secondary | ICD-10-CM | POA: Diagnosis not present

## 2022-09-01 DIAGNOSIS — C44319 Basal cell carcinoma of skin of other parts of face: Secondary | ICD-10-CM | POA: Diagnosis not present

## 2022-09-01 DIAGNOSIS — L578 Other skin changes due to chronic exposure to nonionizing radiation: Secondary | ICD-10-CM | POA: Diagnosis not present

## 2022-09-01 DIAGNOSIS — L821 Other seborrheic keratosis: Secondary | ICD-10-CM | POA: Diagnosis not present

## 2022-09-01 DIAGNOSIS — D225 Melanocytic nevi of trunk: Secondary | ICD-10-CM | POA: Diagnosis not present

## 2022-09-01 DIAGNOSIS — Z86018 Personal history of other benign neoplasm: Secondary | ICD-10-CM | POA: Diagnosis not present

## 2022-09-01 DIAGNOSIS — D485 Neoplasm of uncertain behavior of skin: Secondary | ICD-10-CM | POA: Diagnosis not present

## 2022-09-01 DIAGNOSIS — D2262 Melanocytic nevi of left upper limb, including shoulder: Secondary | ICD-10-CM | POA: Diagnosis not present

## 2022-09-01 DIAGNOSIS — L570 Actinic keratosis: Secondary | ICD-10-CM | POA: Diagnosis not present

## 2022-09-01 DIAGNOSIS — Z85828 Personal history of other malignant neoplasm of skin: Secondary | ICD-10-CM | POA: Diagnosis not present

## 2022-09-01 DIAGNOSIS — Z85820 Personal history of malignant melanoma of skin: Secondary | ICD-10-CM | POA: Diagnosis not present

## 2022-09-01 DIAGNOSIS — D2261 Melanocytic nevi of right upper limb, including shoulder: Secondary | ICD-10-CM | POA: Diagnosis not present

## 2022-09-06 ENCOUNTER — Encounter: Payer: Self-pay | Admitting: Physician Assistant

## 2022-09-06 ENCOUNTER — Ambulatory Visit (INDEPENDENT_AMBULATORY_CARE_PROVIDER_SITE_OTHER): Payer: HMO | Admitting: Physician Assistant

## 2022-09-06 VITALS — BP 150/71 | HR 58 | Temp 98.0°F | Ht 60.0 in | Wt 133.0 lb

## 2022-09-06 DIAGNOSIS — Z09 Encounter for follow-up examination after completed treatment for conditions other than malignant neoplasm: Secondary | ICD-10-CM

## 2022-09-06 DIAGNOSIS — K432 Incisional hernia 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 gangrene: Secondary | ICD-10-CM

## 2022-09-06 NOTE — Progress Notes (Signed)
[facility] POST-OP OFFICE VISIT  09/06/2022  HPI: Pamela Morales is a 82 y.o. female 21 days s/p robotic assisted laparoscopic incisional ventral hernia repair with Dr Leland Johns   She is doing very well Only need 3 narcotic pan pills post-op; otherwise managed with tylenol No abdominal pain, nausea, emesis, fever, chills Bowel movements are regular She is utilizing her abdominal binder Incisions are well healed Ambulating without issue No other complaints   Vital signs: BP (!) 150/71   Pulse (!) 58   Temp 98 F (36.7 C)   Ht 5' (1.524 m)   Wt 133 lb (60.3 kg)   SpO2 97%   BMI 25.97 kg/m    Physical Exam: Constitutional: Well appearing female, NAD Abdomen: Soft, non-tender, non-distended, no rebound/guarding Skin: Laparoscopic incisions are healing well, no erythema or drainage   Assessment/Plan: This is a 82 y.o. female 21 days s/p robotic assisted laparoscopic incisional ventral hernia repair with Dr Leland Johns    - Pain control prn  - Reviewed wound care recommendation  - Reviewed lifting restrictions; 6 weeks total  - She can follow up on as needed basis; She understands to call with questions/concerns  -- Lynden Oxford, PA-C [facility] 09/06/2022, 1:41 PM M-F: 7am - 4pm

## 2022-09-06 NOTE — Patient Instructions (Signed)

## 2022-09-07 DIAGNOSIS — H40013 Open angle with borderline findings, low risk, bilateral: Secondary | ICD-10-CM | POA: Diagnosis not present

## 2022-09-07 DIAGNOSIS — H35371 Puckering of macula, right eye: Secondary | ICD-10-CM | POA: Diagnosis not present

## 2022-09-07 DIAGNOSIS — H35013 Changes in retinal vascular appearance, bilateral: Secondary | ICD-10-CM | POA: Diagnosis not present

## 2022-09-07 DIAGNOSIS — H353132 Nonexudative age-related macular degeneration, bilateral, intermediate dry stage: Secondary | ICD-10-CM | POA: Diagnosis not present

## 2022-09-08 ENCOUNTER — Encounter: Payer: Self-pay | Admitting: Family Medicine

## 2022-09-13 ENCOUNTER — Ambulatory Visit (INDEPENDENT_AMBULATORY_CARE_PROVIDER_SITE_OTHER): Payer: HMO

## 2022-09-13 VITALS — Ht 60.0 in | Wt 130.0 lb

## 2022-09-13 DIAGNOSIS — Z1231 Encounter for screening mammogram for malignant neoplasm of breast: Secondary | ICD-10-CM | POA: Diagnosis not present

## 2022-09-13 DIAGNOSIS — Z0000 Encounter for general adult medical examination without abnormal findings: Secondary | ICD-10-CM

## 2022-09-13 NOTE — Progress Notes (Signed)
Subjective:   Pamela Morales is a 82 y.o. female who presents for Medicare Annual (Subsequent) preventive examination.  Visit Complete: Virtual  I connected with  TRYSTAN LINNANE on 09/13/22 by a audio enabled telemedicine application and verified that I am speaking with the correct person using two identifiers.  Patient Location: Home  Provider Location: Office/Clinic  I discussed the limitations of evaluation and management by telemedicine. The patient expressed understanding and agreed to proceed.  Vital Signs: Because this visit was a virtual/telehealth visit, some criteria may be missing or patient reported. Any vitals not documented were not able to be obtained and vitals that have been documented are patient reported.    Review of Systems      Cardiac Risk Factors include: advanced age (>61men, >8 women);hypertension     Objective:    Today's Vitals   09/13/22 1448  Weight: 130 lb (59 kg)  Height: 5' (1.524 m)   Body mass index is 25.39 kg/m.     09/13/2022    2:55 PM 08/16/2022    6:24 AM 08/08/2022    2:28 PM 05/26/2022    2:07 PM 05/16/2022   11:24 AM 11/16/2021   10:38 AM 11/15/2021    9:56 AM  Advanced Directives  Does Patient Have a Medical Advance Directive? Yes Yes Yes Yes No Yes Yes  Type of Estate agent of [place];Living will Healthcare Power of [place];Living will  Healthcare Power of [place];Living will  Healthcare Power of [place];Living will Healthcare Power of [place];Living will  Does patient want to make changes to medical advance directive?      No - Patient declined No - Patient declined  Copy of Healthcare Power of Attorney in Chart? No - copy requested   No - copy requested  No - copy requested No - copy requested  Would patient like information on creating a medical advance directive?     No - Patient declined      Current Medications (verified) Outpatient Encounter Medications as of 09/13/2022  Medication Sig    acetaminophen (TYLENOL) 500 MG tablet Take 2 tablets (1,000 mg total) by mouth every 6 (six) hours as needed for mild pain.   amLODipine (NORVASC) 10 MG tablet TAKE 1 TABLET BY MOUTH EVERY DAY   Calcium Carbonate-Vitamin D (CALTRATE 600+D PO) Take 1,200 mg by mouth every evening.    famotidine (PEPCID) 20 MG tablet TAKE 1 TABLET BY MOUTH TWICE A DAY (Patient taking differently: Take 20 mg by mouth at bedtime.)   FLUoxetine (PROZAC) 20 MG capsule Take 3 capsules (60 mg total) by mouth daily. (Patient taking differently: Take 40 mg by mouth every morning.)   losartan (COZAAR) 50 MG tablet TAKE 1 TABLET BY MOUTH EVERY DAY (Patient taking differently: Take 50 mg by mouth every morning.)   MAGNESIUM PO Take 500 mg by mouth daily.   MELATONIN PO Take 10 mg by mouth at bedtime.   Multiple Vitamin (MULTIVITAMIN) tablet Take 1 tablet by mouth daily.   Multiple Vitamins-Minerals (PRESERVISION AREDS 2 PO) Take 1 capsule by mouth 2 (two) times daily.   OVER THE COUNTER MEDICATION Take 1 tablet by mouth daily. Jet Asleep   Secukinumab, 300 MG Dose, (COSENTYX, 300 MG DOSE,) 150 MG/ML SOSY Inject 1 application  as directed every 30 (thirty) days.   TRELEGY ELLIPTA 100-62.5-25 MCG/ACT AEPB INHALE 1 PUFF INTO THE LUNGS DAILY (Patient taking differently: Inhale 1 puff into the lungs every morning.)   ibuprofen (ADVIL) 200 MG  tablet Take 2 tablets (400 mg total) by mouth every 6 (six) hours as needed for moderate pain. (Patient not taking: Reported on 09/13/2022)   No facility-administered encounter medications on file as of 09/13/2022.    Allergies (verified) Sulfa antibiotics   History: Past Medical History:  Diagnosis Date   Allergy    Anemia    Anginal pain (HCC)    Aortic atherosclerosis (HCC)    Breast cancer (HCC)    C1 cervical fracture (HCC)    placed in c-collar-to f/u with neurosurgery on 06-21-21 in [place]   CA - cancer of ovary    Cancer (HCC) 02/2008   R breast  (est rec neg and brac  neg)   Cataract    FORMING    Complication of anesthesia    deviated septum-lost memory for about 1 week after anesthesia   COPD (chronic obstructive pulmonary disease) (HCC)    DDD (degenerative disc disease)    in back with chronic pain   Depression    Diverticulosis    DOE (dyspnea on exertion)    Eczema    Elevated serum creatinine    sees nephrology yearly   Elevated TSH    Family history of colon cancer    GERD (gastroesophageal reflux disease)    Hearing loss    Hypertension    Hypertriglyceridemia    Mass of left lung    Migraines    Osteopenia    Psoriatic arthritis (HCC)    Rosacea    Squamous cell carcinoma of left lung (HCC)    Thyroid nodule    Ventral hernia    Zoster 02/2007   Past Surgical History:  Procedure Laterality Date   ABDOMINAL HYSTERECTOMY  1996   BSO fibroids, incidental ovarian CA finding   BASAL CELL CARCINOMA EXCISION  09/2016   bladder tack     BREAST SURGERY     rt total mastectomy for breast CA   CHOLECYSTECTOMY  08/1996   10 yrs ago   COLONOSCOPY     lipoma removal  11/2005   NASAL SEPTUM SURGERY     POLYPECTOMY     VIDEO BRONCHOSCOPY WITH ENDOBRONCHIAL ULTRASOUND Left 06/16/2021   Procedure: POSSIBLE VIDEO BRONCHOSCOPY WITH ENDOBRONCHIAL ULTRASOUND;  Surgeon: Salena Saner, MD;  Location: [facility] ORS;  Service: Pulmonary;  Laterality: Left;   XI ROBOTIC ASSISTED VENTRAL HERNIA N/A 08/16/2022   Procedure: XI ROBOTIC ASSISTED VENTRAL HERNIA;  Surgeon: Henrene Dodge, MD;  Location: [facility] ORS;  Service: General;  Laterality: N/A;   Family History  Problem Relation Age of Onset   Hypertension Mother    Heart disease Mother        CAD   Stomach cancer Mother 66   Lung cancer Father 38   Cancer Sister        colon, skin   Heart disease Sister 28       MI   Diabetes Sister    Colon cancer Sister    Head & neck cancer Sister 26   Lung cancer Sister    Melanoma Sister        multiple on head   Heart disease Brother        HTN  and MI   Hypertension Brother    Cancer Maternal Aunt        unk type   Cancer Paternal Aunt        unk type   Cancer Paternal Uncle        unk type  Colon cancer Daughter 39   Esophageal cancer Neg Hx    Colon polyps Neg Hx    Social History   Socioeconomic History   Marital status: Married    Spouse name: Not on file   Number of children: Not on file   Years of education: Not on file   Highest education level: Some college, no degree  Occupational History   Not on file  Tobacco Use   Smoking status: Former    Current packs/day: 0.00    Average packs/day: 2.0 packs/day for 10.0 years (20.0 ttl pk-yrs)    Types: Cigarettes    Start date: 01/04/1980    Quit date: 01/03/1990    Years since quitting: 32.7    Passive exposure: Past   Smokeless tobacco: Never  Vaping Use   Vaping status: Never Used  Substance and Sexual Activity   Alcohol use: Not Currently    Alcohol/week: 5.0 standard drinks of alcohol    Types: 5 Glasses of wine per week   Drug use: No   Sexual activity: Not Currently  Other Topics Concern   Not on file  Social History Narrative   Not on file   Social Determinants of Health   Financial Resource Strain: Low Risk  (09/13/2022)   Overall Financial Resource Strain (CARDIA)    Difficulty of Paying Living Expenses: Not hard at all  Food Insecurity: No Food Insecurity (09/13/2022)   Hunger Vital Sign    Worried About Running Out of Food in the Last Year: Never true    Ran Out of Food in the Last Year: Never true  Transportation Needs: No Transportation Needs (09/13/2022)   PRAPARE - Administrator, Civil Service (Medical): No    Lack of Transportation (Non-Medical): No  Physical Activity: Sufficiently Active (09/13/2022)   Exercise Vital Sign    Days of Exercise per Week: 4 days    Minutes of Exercise per Session: 90 min  Stress: No Stress Concern Present (09/13/2022)   [employer] of Occupational Health - Occupational Stress  Questionnaire    Feeling of Stress : Not at all  Social Connections: Socially Integrated (09/13/2022)   Social Connection and Isolation Panel [NHANES]    Frequency of Communication with Friends and Family: More than three times a week    Frequency of Social Gatherings with Friends and Family: More than three times a week    Attends Religious Services: More than 4 times per year    Active Member of [employer] or Organizations: Yes    Attends Engineer, structural: More than 4 times per year    Marital Status: Married    Tobacco Counseling Counseling given: Not Answered   Clinical Intake:  Pre-visit preparation completed: Yes  Pain : No/denies pain     BMI - recorded: 25.39 Nutritional Status: BMI 25 -29 Overweight Nutritional Risks: None Diabetes: No  How often do you need to have someone help you when you read instructions, pamphlets, or other written materials from your doctor or pharmacy?: 1 - Never  Interpreter Needed?: No  Information entered by :: C.Bernece Gall LPN   Activities of Daily Living    09/13/2022    2:58 PM 08/16/2022    6:29 AM  In your present state of health, do you have any difficulty performing the following activities:  Hearing? 1 1  Comment hearing aids wears hearing aids  Vision? 0 0  Difficulty concentrating or making decisions? 0 0  Walking or climbing stairs? 0  0  Dressing or bathing? 0 0  Doing errands, shopping? 0   Preparing Food and eating ? N   Using the Toilet? N   In the past six months, have you accidently leaked urine? Y   Comment wears pad   Do you have problems with loss of bowel control? N   Managing your Medications? N   Managing your Finances? N   Housekeeping or managing your Housekeeping? N     Patient Care Team: Tower, Audrie Gallus, MD as PCP - General (Family Medicine) Elmon Else, MD as Consulting Physician (Dermatology) Glenford Peers, OD as Consulting Physician (Optometry) Claud Kelp, MD as Consulting  Physician (General Surgery) Glory Buff, RN as Oncology Nurse Navigator Orlie Dakin, Tollie Pizza, MD as Consulting Physician (Oncology) Kathyrn Sheriff, [facility] (Inactive) as Pharmacist (Pharmacist) Salena Saner, MD as Consulting Physician (Pulmonary Disease) Carmina Miller, MD as Consulting Physician (Radiation Oncology)  Indicate any recent Medical Services you may have received from other than [facility] providers in the past year (date may be approximate).     Assessment:   This is a routine wellness examination for [facility].  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 0 0 1 0 0  Comment   tripped    Number falls in past yr: 0 0 1 0 0  Injury with Fall? 0 1 1 0 0  Comment   lacerated head    Risk for fall due to : No Fall Risks No Fall Risks Impaired balance/gait;Impaired mobility;Medication side effect    Follow up Falls prevention discussed;Falls evaluation completed Follow up appointment Falls evaluation completed;Education provided;Falls prevention discussed Falls evaluation completed     MEDICARE RISK AT HOME: Medicare Risk at Home Any stairs in or around the home?: Yes If so, are there any without handrails?: No (one step in front without rails) Home free of loose throw rugs in  walkways, pet beds, electrical cords, etc?: Yes Adequate lighting in your home to reduce risk of falls?: Yes Life alert?: No Use of a cane, walker or w/c?: No Grab bars in the bathroom?: No Shower chair or bench in shower?: No Elevated toilet seat or a handicapped toilet?: Yes  TIMED UP AND GO:  Was the test performed?  No    Cognitive Function:    10/12/2016    3:15 PM 03/24/2015    2:15 PM  MMSE - Mini Mental State Exam  Orientation to time 5 5  Orientation to Place 5 5  Registration 3 3  Attention/ Calculation 0 5  Recall 3 1  Language- name 2 objects 0 0  Language- repeat 1 1  Language- follow 3 step command 3 3  Language- read & follow direction 0 1  Write a sentence 0 0  Copy design 0 0  Total score 20 24        09/13/2022    3:00 PM 09/21/2021    2:07 PM 09/19/2020   11:06 AM  6CIT Screen  What Year? 0 points 0 points 0 points  What month? 0 points 0 points 0 points  What time? 0 points 0 points 0 points  Count back from 20 0 points 0 points 0 points  Months in reverse 0 points 0 points 0 points  Repeat phrase 0 points 0 points   Total Score 0 points 0 points     Immunizations Immunization History  Administered Date(s) Administered   COVID-19, mRNA, vaccine(Comirnaty)12 years and older 10/24/2021   Fluad Quad(high Dose 65+) 01/01/2020   Influenza Split 10/11/2011   Influenza Whole 10/04/2006   Influenza, High Dose Seasonal PF 10/12/2016, 10/30/2017, 10/04/2018, 01/01/2020, 09/28/2021   Influenza,inj,Quad PF,6+ Mos 09/19/2012, 10/31/2013, 10/10/2014   Influenza-Unspecified 09/18/2015   Moderna Sars-Covid-2 Vaccination 01/29/2019, 02/26/2019, 11/23/2019   Pneumococcal Conjugate-13 04/28/2014   Pneumococcal Polysaccharide-23 08/03/2012   Respiratory Syncytial Virus Vaccine,Recomb Aduvanted(Arexvy) 01/19/2022   Td 01/03/1994, 07/17/2008, 12/10/2018   Zoster Recombinant(Shingrix) 03/25/2021, 08/23/2021    TDAP status: Up to date  Flu Vaccine status:  Due, Education has been provided regarding the importance of this vaccine. Advised may receive this vaccine at local pharmacy or Health Dept. Aware to provide a copy of the vaccination record if obtained from local pharmacy or Health Dept. Verbalized acceptance and understanding.  Pneumococcal vaccine status: Up to date  Covid-19 vaccine status: Information provided on how to obtain vaccines.   Qualifies for Shingles Vaccine? Yes   Zostavax completed      Shingrix Completed?: Yes  Screening Tests Health Maintenance  Topic Date Due   MAMMOGRAM  05/14/2022   COVID-19 Vaccine (5 - 2023-24 season) 01/03/2023 (Originally 09/04/2022)   INFLUENZA VACCINE  04/03/2023 (Originally 08/04/2022)   Medicare Annual Wellness (AWV)  09/13/2023   Colonoscopy  09/21/2023   DTaP/Tdap/Td (4 - Tdap) 12/09/2028   Pneumonia Vaccine 29+ Years old  Completed   DEXA SCAN  Completed   Zoster Vaccines- Shingrix  Completed   HPV VACCINES  Aged Out    Health Maintenance  Health Maintenance Due  Topic Date Due   MAMMOGRAM  05/14/2022    Colorectal cancer screening: No longer required.   Mammogram status: Ordered 09/13/22. Pt provided with contact info and advised to call to schedule appt.   Bone Density status: Completed 05/13/21. Results reflect: Bone density results: OSTEOPENIA. Repeat every 2 years.  Lung Cancer Screening: (Low Dose CT Chest recommended if Age 4-80 years, 20 pack-year currently smoking OR have quit w/in 15years.) does qualify.   Lung Cancer Screening Referral: followed by oncology  Additional Screening:  Hepatitis C Screening: does not qualify;   Vision Screening: Recommended annual ophthalmology exams for early detection of glaucoma and other disorders of the eye. Is the patient up to date with their annual eye exam?  Yes  Who is the provider or what is the name of the office in which the patient attends annual eye exams? Dr.Hecker and Dr.Matthews If pt is not established with a  provider, would they like to be referred to a provider to establish care? Yes .   Dental Screening: Recommended annual dental exams for proper oral hygiene  Diabetic Foot Exam:   Community Resource Referral / Chronic Care Management: CRR required this visit?  No   CCM required this visit?  No     Plan:     I have personally reviewed and noted the following in the patient's chart:   Medical and social history Use of alcohol, tobacco or illicit drugs  Current medications and supplements including opioid prescriptions. Patient is not currently taking opioid prescriptions. Functional ability and status Nutritional status Physical activity Advanced directives List of other physicians Hospitalizations, surgeries, and ER visits in previous 12 months Vitals Screenings to include cognitive, depression, and falls Referrals and appointments  In addition, I have reviewed and discussed with patient certain preventive protocols, quality metrics, and best practice recommendations. A written personalized care plan for preventive services as well as general preventive health recommendations were provided to patient.     Maryan Puls, LPN   09/17/7827   After Visit Summary: (MyChart) Due to this being a telephonic visit, the after visit summary with patients personalized plan was offered to patient via MyChart   Nurse Notes: none

## 2022-09-13 NOTE — Patient Instructions (Addendum)
Pamela Morales , Thank you for taking time to come for your Medicare Wellness Visit. I appreciate your ongoing commitment to your health goals. Please review the following plan we discussed and let me know if I can assist you in the future.   Referrals/Orders/Follow-Ups/Clinician Recommendations: Aim for 30 minutes of exercise or brisk walking, 6-8 glasses of water, and 5 servings of fruits and vegetables each day.   You have an order for:  []   2D Mammogram  [x]   3D Mammogram  []   Bone Density     Please call for appointment:  [facility] [address] [place], [place] [address] [phone]  [facility] [address] [place], [place] [address] [phone]  [facility] [place] [address] [place], [place] [address] [phone]  [facility] [address] [place], [place] [address] [phone]  [facility] [address] [place], [place] [address] [phone]    Make sure to wear two-piece clothing.  No lotions, powders, or deodorants the day of the appointment. Make sure to bring picture ID and insurance card.  Bring list of medications you are currently taking including any supplements.   Schedule your [place] screening mammogram through MyChart!   Log into your MyChart account.  Go to 'Visit' (or 'Appointments' if on mobile App) --> Schedule an Appointment  Under 'Select a Reason for Visit' choose the Mammogram Screening option.  Complete the pre-visit questions and select the time and place that best fits your schedule.    This is a list of the screening recommended for you and due dates:  Health Maintenance  Topic Date Due   Mammogram  05/14/2022   Flu Shot  08/04/2022   COVID-19 Vaccine (5 - 2023-24 season) 09/04/2022   Medicare Annual Wellness Visit  09/22/2022   Colon Cancer Screening  09/21/2023   DTaP/Tdap/Td vaccine  (4 - Tdap) 12/09/2028   Pneumonia Vaccine  Completed   DEXA scan (bone density measurement)  Completed   Zoster (Shingles) Vaccine  Completed   HPV Vaccine  Aged Out    Advanced directives: (Copy Requested) Please bring a copy of your health care power of attorney and living will to the office to be added to your chart at your convenience.  Next Medicare Annual Wellness Visit scheduled for next year: Yes

## 2022-10-10 DIAGNOSIS — C44319 Basal cell carcinoma of skin of other parts of face: Secondary | ICD-10-CM | POA: Diagnosis not present

## 2022-10-13 ENCOUNTER — Ambulatory Visit
Admission: RE | Admit: 2022-10-13 | Discharge: 2022-10-13 | Disposition: A | Discharge status: Home / Self Care | Payer: HMO | Source: Ambulatory Visit | Attending: Family Medicine | Admitting: Family Medicine

## 2022-10-13 DIAGNOSIS — Z1231 Encounter for screening mammogram for malignant neoplasm of breast: Secondary | ICD-10-CM

## 2022-11-03 ENCOUNTER — Encounter (INDEPENDENT_AMBULATORY_CARE_PROVIDER_SITE_OTHER): Payer: HMO | Admitting: Ophthalmology

## 2022-11-03 DIAGNOSIS — I10 Essential (primary) hypertension: Secondary | ICD-10-CM

## 2022-11-03 DIAGNOSIS — H43813 Vitreous degeneration, bilateral: Secondary | ICD-10-CM | POA: Diagnosis not present

## 2022-11-03 DIAGNOSIS — H353132 Nonexudative age-related macular degeneration, bilateral, intermediate dry stage: Secondary | ICD-10-CM | POA: Diagnosis not present

## 2022-11-03 DIAGNOSIS — H35033 Hypertensive retinopathy, bilateral: Secondary | ICD-10-CM

## 2022-11-03 DIAGNOSIS — H34812 Central retinal vein occlusion, left eye, with macular edema: Secondary | ICD-10-CM

## 2022-11-06 ENCOUNTER — Other Ambulatory Visit: Payer: Self-pay | Admitting: Family Medicine

## 2022-11-08 ENCOUNTER — Other Ambulatory Visit: Payer: Self-pay | Admitting: Family Medicine

## 2022-11-24 ENCOUNTER — Ambulatory Visit
Admission: RE | Admit: 2022-11-24 | Discharge: 2022-11-24 | Disposition: A | Discharge status: Home / Self Care | Payer: HMO | Source: Ambulatory Visit | Attending: Oncology | Admitting: Oncology

## 2022-11-24 DIAGNOSIS — C3492 Malignant neoplasm of unspecified part of left bronchus or lung: Secondary | ICD-10-CM | POA: Insufficient documentation

## 2022-11-24 DIAGNOSIS — I700 Atherosclerosis of aorta: Secondary | ICD-10-CM | POA: Diagnosis not present

## 2022-11-24 DIAGNOSIS — J432 Centrilobular emphysema: Secondary | ICD-10-CM | POA: Diagnosis not present

## 2022-11-24 DIAGNOSIS — C3490 Malignant neoplasm of unspecified part of unspecified bronchus or lung: Secondary | ICD-10-CM | POA: Diagnosis not present

## 2022-11-24 LAB — POCT I-STAT CREATININE: Creatinine, Ser: 1.7 mg/dL — ABNORMAL HIGH (ref 0.44–1.00)

## 2022-11-24 MED ORDER — IOHEXOL 300 MG/ML  SOLN
75.0000 mL | Freq: Once | INTRAMUSCULAR | Status: AC | PRN
  Administered 2022-11-24: 50 mL via INTRAVENOUS

## 2022-11-28 ENCOUNTER — Ambulatory Visit
Admission: RE | Admit: 2022-11-28 | Discharge: 2022-11-28 | Disposition: A | Discharge status: Home / Self Care | Payer: HMO | Source: Ambulatory Visit | Attending: Radiation Oncology | Admitting: Radiation Oncology

## 2022-11-28 ENCOUNTER — Inpatient Hospital Stay: Payer: HMO | Admitting: Oncology

## 2022-11-28 VITALS — BP 144/70 | HR 60 | Temp 97.0°F | Resp 17 | Wt 136.0 lb

## 2022-11-28 DIAGNOSIS — J449 Chronic obstructive pulmonary disease, unspecified: Secondary | ICD-10-CM | POA: Insufficient documentation

## 2022-11-28 DIAGNOSIS — Z85118 Personal history of other malignant neoplasm of bronchus and lung: Secondary | ICD-10-CM | POA: Insufficient documentation

## 2022-11-28 DIAGNOSIS — Z923 Personal history of irradiation: Secondary | ICD-10-CM | POA: Diagnosis not present

## 2022-11-28 DIAGNOSIS — Z87891 Personal history of nicotine dependence: Secondary | ICD-10-CM | POA: Diagnosis not present

## 2022-11-28 DIAGNOSIS — C3412 Malignant neoplasm of upper lobe, left bronchus or lung: Secondary | ICD-10-CM | POA: Diagnosis not present

## 2022-11-28 DIAGNOSIS — C3492 Malignant neoplasm of unspecified part of left bronchus or lung: Secondary | ICD-10-CM

## 2022-11-28 NOTE — Progress Notes (Signed)
Radiation Oncology Follow up Note  Name: Pamela Morales   Date:   11/28/2022 MRN:  [id number] DOB: Jun 21, 1940    This 82 y.o. 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 She reports no new symptoms or problems. The patient acknowledges ongoing issues with COPD but denies any new respiratory symptoms such as cough or hemoptysis. A recent CT scan, although not formally read, shows a decrease in the size of the mass from 2.9 cm to 1.7 cm.  COMPLICATIONS OF TREATMENT: none  FOLLOW UP COMPLIANCE: keeps appointments   PHYSICAL EXAM:  BP (!) 144/70 (Patient Position: Sitting) Comment: refused recheck  Pulse 60   Temp (!) 97 F (36.1 C) (Tympanic)   Resp 17   Wt 136 lb (61.7 kg)   SpO2 99%   BMI 26.56 kg/m  Well-developed well-nourished patient in NAD. HEENT reveals PERLA, EOMI, discs not visualized.  Oral cavity is clear. No oral mucosal lesions are identified. Neck is clear without evidence of cervical or supraclavicular adenopathy. Lungs are clear to A&P. Cardiac examination is essentially unremarkable with regular rate and rhythm without murmur rub or thrill. Abdomen is benign with no organomegaly or masses noted. Motor sensory and DTR levels are equal and symmetric in the upper and lower extremities. Cranial nerves II through XII are grossly intact. Proprioception is intact. No peripheral adenopathy or edema is identified. No motor or sensory levels are noted. Crude visual fields are within normal range.  RADIOLOGY RESULTS: CT scans reviewed compatible with above-stated findings  PLAN: Squamous Cell Carcinoma, Stage 1B, Left Upper Lobe 16 months post-SBRT, mass continues to decrease  in size (from 2.9cm to 1.7cm). No new symptoms suggestive of recurrence. -Schedule follow-up visit with CT scan in 6 months. If stable, consider extending follow-up interval to annually.  Chronic Obstructive Pulmonary Disease (COPD) Stable, no new symptoms. -Continue current management.  Patient knows to call with any concerns.    Carmina Miller, MD

## 2022-12-05 ENCOUNTER — Encounter: Payer: Self-pay | Admitting: Oncology

## 2022-12-05 ENCOUNTER — Inpatient Hospital Stay: Payer: HMO | Attending: Oncology | Admitting: Oncology

## 2022-12-05 VITALS — BP 136/60 | HR 60 | Temp 96.6°F | Wt 136.0 lb

## 2022-12-05 DIAGNOSIS — E041 Nontoxic single thyroid nodule: Secondary | ICD-10-CM | POA: Diagnosis not present

## 2022-12-05 DIAGNOSIS — Z853 Personal history of malignant neoplasm of breast: Secondary | ICD-10-CM | POA: Insufficient documentation

## 2022-12-05 DIAGNOSIS — Z08 Encounter for follow-up examination after completed treatment for malignant neoplasm: Secondary | ICD-10-CM | POA: Insufficient documentation

## 2022-12-05 DIAGNOSIS — C3492 Malignant neoplasm of unspecified part of left bronchus or lung: Secondary | ICD-10-CM

## 2022-12-05 DIAGNOSIS — Z8543 Personal history of malignant neoplasm of ovary: Secondary | ICD-10-CM | POA: Insufficient documentation

## 2022-12-05 DIAGNOSIS — Z85820 Personal history of malignant melanoma of skin: Secondary | ICD-10-CM | POA: Insufficient documentation

## 2022-12-05 DIAGNOSIS — Z85118 Personal history of other malignant neoplasm of bronchus and lung: Secondary | ICD-10-CM | POA: Diagnosis not present

## 2022-12-05 NOTE — Progress Notes (Signed)
[facility]  [phone] [phone]  ID: Pamela Morales OB: 02/02/1940  MR#: [id number]  AOZ#:[id number]  Patient Care Team: Judy Pimple, MD as PCP - General (Family Medicine) Elmon Else, MD as Consulting Physician (Dermatology) Glenford Peers, OD as Consulting Physician (Optometry) Claud Kelp, MD as Consulting Physician (General Surgery) Glory Buff, RN as Oncology Nurse Navigator Orlie Dakin, Tollie Pizza, MD as Consulting Physician (Oncology) Kathyrn Sheriff, [facility] (Inactive) as Pharmacist (Pharmacist) Salena Saner, MD as Consulting Physician (Pulmonary Disease) Carmina Miller, MD as Consulting Physician (Radiation Oncology)  CHIEF COMPLAINT: 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  INTERVAL HISTORY: Patient returns to clinic today for routine 69-month evaluation and discussion of her CT scan results.  She currently feels well and is asymptomatic.  She has no neurologic complaints.  She denies any recent fevers or illnesses.  She has a good appetite and denies weight loss.  She denies any chest pain, shortness of breath, cough, hemoptysis.  She denies any nausea, vomiting, constipation, or diarrhea.  She has no urinary complaints.  Patient offers no specific complaints today.  REVIEW OF SYSTEMS:   Review of Systems  Constitutional: Negative.  Negative for fever, malaise/fatigue and weight loss.  Respiratory: Negative.  Negative for cough and shortness of breath.   Cardiovascular: Negative.  Negative for chest pain and leg swelling.  Gastrointestinal: Negative.  Negative for abdominal pain.  Genitourinary: Negative.  Negative for dysuria.  Musculoskeletal: Negative.  Negative for back pain.  Skin: Negative.  Negative for rash.  Neurological: Negative.  Negative for dizziness, focal weakness, weakness and headaches.  Psychiatric/Behavioral: Negative.  The patient is not nervous/anxious.     As per HPI. Otherwise, a  complete review of systems is negative.  PAST MEDICAL HISTORY: Past Medical History:  Diagnosis Date   Allergy    Anemia    Anginal pain (HCC)    Aortic atherosclerosis (HCC)    Breast cancer (HCC)    C1 cervical fracture (HCC)    placed in c-collar-to f/u with neurosurgery on 06-21-21 in [place]   CA - cancer of ovary    Cancer (HCC) 02/2008   R breast  (est rec neg and brac neg)   Cataract    FORMING    Complication of anesthesia    deviated septum-lost memory for about 1 week after anesthesia   COPD (chronic obstructive pulmonary disease) (HCC)    DDD (degenerative disc disease)    in back with chronic pain   Depression    Diverticulosis    DOE (dyspnea on exertion)    Eczema    Elevated serum creatinine    sees nephrology yearly   Elevated TSH    Family history of colon cancer    GERD (gastroesophageal reflux disease)    Hearing loss    Hypertension    Hypertriglyceridemia    Mass of left lung    Migraines    Osteopenia    Psoriatic arthritis (HCC)    Rosacea    Squamous cell carcinoma of left lung (HCC)    Thyroid nodule    Ventral hernia    Zoster 02/2007    PAST SURGICAL HISTORY: Past Surgical History:  Procedure Laterality Date   ABDOMINAL HYSTERECTOMY  1996   BSO fibroids, incidental ovarian CA finding   BASAL CELL CARCINOMA EXCISION  09/2016   bladder tack     BREAST SURGERY     rt total mastectomy for breast CA   CHOLECYSTECTOMY  08/1996   10 yrs ago   COLONOSCOPY     lipoma removal  11/2005   NASAL SEPTUM SURGERY     POLYPECTOMY     VIDEO BRONCHOSCOPY WITH ENDOBRONCHIAL ULTRASOUND Left 06/16/2021   Procedure: POSSIBLE VIDEO BRONCHOSCOPY WITH ENDOBRONCHIAL ULTRASOUND;  Surgeon: Salena Saner, MD;  Location: [facility] ORS;  Service: Pulmonary;  Laterality: Left;   XI ROBOTIC ASSISTED VENTRAL HERNIA N/A 08/16/2022   Procedure: XI ROBOTIC ASSISTED VENTRAL HERNIA;  Surgeon: Henrene Dodge, MD;  Location: [facility] ORS;  Service: General;  Laterality:  N/A;    FAMILY HISTORY: Family History  Problem Relation Age of Onset   Hypertension Mother    Heart disease Mother        CAD   Stomach cancer Mother 77   Lung cancer Father 35   Cancer Sister        colon, skin   Heart disease Sister 49       MI   Diabetes Sister    Colon cancer Sister    Head & neck cancer Sister 60   Lung cancer Sister    Melanoma Sister        multiple on head   Heart disease Brother        HTN and MI   Hypertension Brother    Cancer Maternal Aunt        unk type   Cancer Paternal Aunt        unk type   Cancer Paternal Uncle        unk type   Colon cancer Daughter 51   Esophageal cancer Neg Hx    Colon polyps Neg Hx     ADVANCED DIRECTIVES (Y/N):  N  HEALTH MAINTENANCE: Social History   Tobacco Use   Smoking status: Former    Current packs/day: 0.00    Average packs/day: 2.0 packs/day for 10.0 years (20.0 ttl pk-yrs)    Types: Cigarettes    Start date: 01/04/1980    Quit date: 01/03/1990    Years since quitting: 32.9    Passive exposure: Past   Smokeless tobacco: Never  Vaping Use   Vaping status: Never Used  Substance Use Topics   Alcohol use: Not Currently    Alcohol/week: 5.0 standard drinks of alcohol    Types: 5 Glasses of wine per week   Drug use: No     Colonoscopy:  PAP:  Bone density:  Lipid panel:  Allergies  Allergen Reactions   Sulfa Antibiotics     Pt unsure of reaction    Current Outpatient Medications  Medication Sig Dispense Refill   acetaminophen (TYLENOL) 500 MG tablet Take 2 tablets (1,000 mg total) by mouth every 6 (six) hours as needed for mild pain.     amLODipine (NORVASC) 10 MG tablet TAKE 1 TABLET BY MOUTH EVERY DAY 90 tablet 1   Calcium Carbonate-Vitamin D (CALTRATE 600+D PO) Take 1,200 mg by mouth every evening.      famotidine (PEPCID) 20 MG tablet TAKE 1 TABLET BY MOUTH TWICE A DAY 180 tablet 1   FLUoxetine (PROZAC) 20 MG capsule TAKE 3 CAPSULES BY MOUTH EVERY DAY 270 capsule 1   losartan  (COZAAR) 50 MG tablet TAKE 1 TABLET BY MOUTH EVERY DAY (Patient taking differently: Take 50 mg by mouth every morning.) 90 tablet 1   MAGNESIUM PO Take 500 mg by mouth daily.     MELATONIN PO Take 10 mg by mouth at bedtime.     Multiple Vitamin (MULTIVITAMIN)  tablet Take 1 tablet by mouth daily.     Multiple Vitamins-Minerals (PRESERVISION AREDS 2 PO) Take 1 capsule by mouth 2 (two) times daily.     OVER THE COUNTER MEDICATION Take 1 tablet by mouth daily. Jet Asleep     Secukinumab, 300 MG Dose, (COSENTYX, 300 MG DOSE,) 150 MG/ML SOSY Inject 1 application  as directed every 30 (thirty) days.     TRELEGY ELLIPTA 100-62.5-25 MCG/ACT AEPB INHALE 1 PUFF INTO THE LUNGS DAILY (Patient taking differently: Inhale 1 puff into the lungs every morning.) 60 each 11   ibuprofen (ADVIL) 200 MG tablet Take 2 tablets (400 mg total) by mouth every 6 (six) hours as needed for moderate pain. (Patient not taking: Reported on 09/13/2022)     No current facility-administered medications for this visit.    OBJECTIVE: Vitals:   12/05/22 1256  BP: 136/60  Pulse: 60  Temp: (!) 96.6 F (35.9 C)  SpO2: 100%     Body mass index is 26.56 kg/m.    ECOG FS:0 - Asymptomatic  General: Well-developed, well-nourished, no acute distress. Eyes: Pink conjunctiva, anicteric sclera. HEENT: Normocephalic, moist mucous membranes. Lungs: No audible wheezing or coughing. Heart: Regular rate and rhythm. Abdomen: Soft, nontender, no obvious distention. Musculoskeletal: No edema, cyanosis, or clubbing. Neuro: Alert, answering all questions appropriately. Cranial nerves grossly intact. Skin: No rashes or petechiae noted. Psych: Normal affect.  LAB RESULTS:  Lab Results  Component Value Date   NA 138 08/08/2022   K 3.9 08/08/2022   CL 107 08/08/2022   CO2 22 08/08/2022   GLUCOSE 101 (H) 08/08/2022   BUN 28 (H) 08/08/2022   CREATININE 1.70 (H) 11/24/2022   CALCIUM 9.3 08/08/2022   PROT 6.9 04/06/2022   ALBUMIN 4.0  04/06/2022   AST 25 04/06/2022   ALT 20 04/06/2022   ALKPHOS 91 04/06/2022   BILITOT 0.3 04/06/2022   GFRNONAA 41 (L) 08/08/2022   GFRAA (L) 05/07/2008    56        The eGFR has been calculated using the MDRD equation. This calculation has not been validated in all clinical situations. eGFR's persistently <60 mL/min signify possible Chronic Kidney Disease.    Lab Results  Component Value Date   WBC 8.7 08/08/2022   NEUTROABS 5.2 04/06/2022   HGB 10.9 (L) 08/08/2022   HCT 33.7 (L) 08/08/2022   MCV 83.4 08/08/2022   PLT 234 08/08/2022     STUDIES: CT Chest W Contrast  Result Date: 12/05/2022 CLINICAL DATA:  Squamous cell carcinoma of the lung. * Tracking Code: BO * EXAM: CT CHEST WITH CONTRAST TECHNIQUE: Multidetector CT imaging of the chest was performed during intravenous contrast administration. RADIATION DOSE REDUCTION: This exam was performed according to the departmental dose-optimization program which includes automated exposure control, adjustment of the mA and/or kV according to patient size and/or use of iterative reconstruction technique. CONTRAST:  50mL OMNIPAQUE IOHEXOL 300 MG/ML  SOLN COMPARISON:  05/11/2022 and PET 11/11/2021. FINDINGS: Cardiovascular: Atherosclerotic calcification of the aorta, aortic valve and coronary arteries. Enlarged pulmonic trunk and heart. No pericardial effusion. Mediastinum/Nodes: Heterogeneous low-attenuation left thyroid nodule measures 3.3 cm. No pathologically enlarged mediastinal, hilar or axillary lymph nodes. Esophagus is grossly unremarkable. Lungs/Pleura: Apical left upper lobe nodule measures 1.1 x 1.8 cm (4/33), stable from 05/11/2022. Surrounding ground-glass, septal thickening, consolidation and architectural distortion, extending to the left suprahilar region, presumably treatment related. Additional linear scarring in the lingula centrilobular emphysema. No pleural fluid. Airway is unremarkable. Upper Abdomen: Cholecystectomy.  Cortical scarring/thinning in the kidneys. Multiple low-attenuation nodules throughout the spleen as before, too small to characterize. Visualized portions of the liver, adrenal glands, kidneys, spleen, pancreas, stomach and bowel are otherwise grossly unremarkable. No upper abdominal adenopathy. Musculoskeletal: Degenerative changes in the spine. Old bilateral rib fractures. IMPRESSION: 1. Apical left upper lobe nodule is stable, with presumed surrounding post treatment changes. No evidence disease progression. 2. Heterogeneous low-attenuation left thyroid mass. Recommend thyroid ultrasound, as clinically warranted in the setting of significant comorbidities or limited life expectancy (Ref: J Am Coll Radiol. 2015 Feb;12(2): 143-50). 3. Aortic atherosclerosis (ICD10-I70.0). Coronary artery calcification. 4. Enlarged pulmonic trunk, indicative of pulmonary arterial hypertension. 5.  Emphysema (ICD10-J43.9). Electronically Signed   By: Leanna Battles M.D.   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  Biopsy confirmed the results.  Surgical resection was discussed, but patient opted to pursue XRT only completing in August 2023.  Repeat PET scan on November 13, 2021 with essential resolution of hypermetabolism of the lesion.  Her most recent imaging on December 05, 2022 reviewed independently and reported as above with no obvious evidence of recurrent or progressive disease. Continue CT scans every 6 months for 2 years through August 2025 at which point patient can then transition to CT scans yearly.  Return to clinic in 6 months for repeat imaging and further evaluation History of breast cancer: Greater than 15 years ago.  Patient states she underwent mastectomy, but did not receive any XRT,  chemotherapy, or hormonal treatment. History of ovarian cancer: Patient reports this was an incidental on pathology after having a total hysterectomy. History of melanoma: Unclear depth or stage. Thyroid nodule: Reported stable.  Monitor. Genetics: Patient was previously given a referral to genetics.  I spent a total of 20 minutes reviewing chart data, face-to-face evaluation with the patient, counseling and coordination of care as detailed above.   Patient expressed understanding and was in agreement with this plan. She also understands that She can call clinic at any time with any questions, concerns, or complaints.    Cancer Staging  Squamous cell carcinoma of left lung [facility]) Staging form: Lung, AJCC 8th Edition - Clinical stage from 06/23/2021: Stage IB (cT2a, cN0, cM0) - Signed by Jeralyn Ruths, MD on 06/23/2021   Jeralyn Ruths, MD   12/05/2022 1:08 PM

## 2022-12-29 ENCOUNTER — Other Ambulatory Visit: Payer: Self-pay | Admitting: Family Medicine

## 2023-01-17 DIAGNOSIS — Z79899 Other long term (current) drug therapy: Secondary | ICD-10-CM | POA: Diagnosis not present

## 2023-01-17 DIAGNOSIS — Z6824 Body mass index (BMI) 24.0-24.9, adult: Secondary | ICD-10-CM | POA: Diagnosis not present

## 2023-01-17 DIAGNOSIS — L409 Psoriasis, unspecified: Secondary | ICD-10-CM | POA: Diagnosis not present

## 2023-01-17 DIAGNOSIS — L4050 Arthropathic psoriasis, unspecified: Secondary | ICD-10-CM | POA: Diagnosis not present

## 2023-01-17 DIAGNOSIS — M1991 Primary osteoarthritis, unspecified site: Secondary | ICD-10-CM | POA: Diagnosis not present

## 2023-02-02 ENCOUNTER — Encounter (INDEPENDENT_AMBULATORY_CARE_PROVIDER_SITE_OTHER): Payer: HMO | Admitting: Ophthalmology

## 2023-02-02 DIAGNOSIS — H34812 Central retinal vein occlusion, left eye, with macular edema: Secondary | ICD-10-CM | POA: Diagnosis not present

## 2023-02-02 DIAGNOSIS — H35033 Hypertensive retinopathy, bilateral: Secondary | ICD-10-CM | POA: Diagnosis not present

## 2023-02-02 DIAGNOSIS — I10 Essential (primary) hypertension: Secondary | ICD-10-CM

## 2023-02-02 DIAGNOSIS — H353132 Nonexudative age-related macular degeneration, bilateral, intermediate dry stage: Secondary | ICD-10-CM | POA: Diagnosis not present

## 2023-02-02 DIAGNOSIS — H43813 Vitreous degeneration, bilateral: Secondary | ICD-10-CM

## 2023-02-05 ENCOUNTER — Other Ambulatory Visit: Payer: Self-pay | Admitting: Family Medicine

## 2023-03-06 DIAGNOSIS — N1832 Chronic kidney disease, stage 3b: Secondary | ICD-10-CM | POA: Diagnosis not present

## 2023-03-14 DIAGNOSIS — L4050 Arthropathic psoriasis, unspecified: Secondary | ICD-10-CM | POA: Diagnosis not present

## 2023-03-14 DIAGNOSIS — I129 Hypertensive chronic kidney disease with stage 1 through stage 4 chronic kidney disease, or unspecified chronic kidney disease: Secondary | ICD-10-CM | POA: Diagnosis not present

## 2023-03-14 DIAGNOSIS — N1832 Chronic kidney disease, stage 3b: Secondary | ICD-10-CM | POA: Diagnosis not present

## 2023-03-16 DIAGNOSIS — Z85828 Personal history of other malignant neoplasm of skin: Secondary | ICD-10-CM | POA: Diagnosis not present

## 2023-03-16 DIAGNOSIS — D2262 Melanocytic nevi of left upper limb, including shoulder: Secondary | ICD-10-CM | POA: Diagnosis not present

## 2023-03-16 DIAGNOSIS — D225 Melanocytic nevi of trunk: Secondary | ICD-10-CM | POA: Diagnosis not present

## 2023-03-16 DIAGNOSIS — D2261 Melanocytic nevi of right upper limb, including shoulder: Secondary | ICD-10-CM | POA: Diagnosis not present

## 2023-03-16 DIAGNOSIS — D485 Neoplasm of uncertain behavior of skin: Secondary | ICD-10-CM | POA: Diagnosis not present

## 2023-03-16 DIAGNOSIS — L570 Actinic keratosis: Secondary | ICD-10-CM | POA: Diagnosis not present

## 2023-03-16 DIAGNOSIS — Z808 Family history of malignant neoplasm of other organs or systems: Secondary | ICD-10-CM | POA: Diagnosis not present

## 2023-03-16 DIAGNOSIS — L821 Other seborrheic keratosis: Secondary | ICD-10-CM | POA: Diagnosis not present

## 2023-03-16 DIAGNOSIS — Z86018 Personal history of other benign neoplasm: Secondary | ICD-10-CM | POA: Diagnosis not present

## 2023-03-16 DIAGNOSIS — Z85820 Personal history of malignant melanoma of skin: Secondary | ICD-10-CM | POA: Diagnosis not present

## 2023-03-16 DIAGNOSIS — D0439 Carcinoma in situ of skin of other parts of face: Secondary | ICD-10-CM | POA: Diagnosis not present

## 2023-03-16 DIAGNOSIS — L578 Other skin changes due to chronic exposure to nonionizing radiation: Secondary | ICD-10-CM | POA: Diagnosis not present

## 2023-03-16 DIAGNOSIS — D2272 Melanocytic nevi of left lower limb, including hip: Secondary | ICD-10-CM | POA: Diagnosis not present

## 2023-04-20 DIAGNOSIS — D0439 Carcinoma in situ of skin of other parts of face: Secondary | ICD-10-CM | POA: Diagnosis not present

## 2023-05-04 ENCOUNTER — Encounter (INDEPENDENT_AMBULATORY_CARE_PROVIDER_SITE_OTHER): Payer: HMO | Admitting: Ophthalmology

## 2023-05-04 DIAGNOSIS — I10 Essential (primary) hypertension: Secondary | ICD-10-CM

## 2023-05-04 DIAGNOSIS — H353132 Nonexudative age-related macular degeneration, bilateral, intermediate dry stage: Secondary | ICD-10-CM

## 2023-05-04 DIAGNOSIS — H34812 Central retinal vein occlusion, left eye, with macular edema: Secondary | ICD-10-CM

## 2023-05-04 DIAGNOSIS — H43813 Vitreous degeneration, bilateral: Secondary | ICD-10-CM | POA: Diagnosis not present

## 2023-05-04 DIAGNOSIS — H35033 Hypertensive retinopathy, bilateral: Secondary | ICD-10-CM

## 2023-06-05 ENCOUNTER — Ambulatory Visit
Admission: RE | Admit: 2023-06-05 | Discharge: 2023-06-05 | Disposition: A | Discharge status: Home / Self Care | Payer: HMO | Source: Ambulatory Visit | Attending: Oncology | Admitting: Oncology

## 2023-06-05 DIAGNOSIS — C3492 Malignant neoplasm of unspecified part of left bronchus or lung: Secondary | ICD-10-CM | POA: Diagnosis not present

## 2023-06-05 LAB — POCT I-STAT CREATININE: Creatinine, Ser: 1.5 mg/dL — ABNORMAL HIGH (ref 0.44–1.00)

## 2023-06-05 MED ORDER — IOHEXOL 300 MG/ML  SOLN
75.0000 mL | Freq: Once | INTRAMUSCULAR | Status: AC | PRN
  Administered 2023-06-05: 60 mL via INTRAVENOUS

## 2023-06-12 ENCOUNTER — Encounter: Payer: Self-pay | Admitting: Oncology

## 2023-06-12 ENCOUNTER — Inpatient Hospital Stay: Payer: HMO | Attending: Oncology | Admitting: Oncology

## 2023-06-12 VITALS — BP 138/70 | HR 58 | Temp 97.6°F | Resp 18 | Wt 134.0 lb

## 2023-06-12 DIAGNOSIS — Z85118 Personal history of other malignant neoplasm of bronchus and lung: Secondary | ICD-10-CM | POA: Insufficient documentation

## 2023-06-12 DIAGNOSIS — Z853 Personal history of malignant neoplasm of breast: Secondary | ICD-10-CM | POA: Diagnosis not present

## 2023-06-12 DIAGNOSIS — Z90710 Acquired absence of both cervix and uterus: Secondary | ICD-10-CM | POA: Insufficient documentation

## 2023-06-12 DIAGNOSIS — C3492 Malignant neoplasm of unspecified part of left bronchus or lung: Secondary | ICD-10-CM | POA: Diagnosis not present

## 2023-06-12 DIAGNOSIS — Z87891 Personal history of nicotine dependence: Secondary | ICD-10-CM | POA: Diagnosis not present

## 2023-06-12 NOTE — Progress Notes (Signed)
 She had a CT scan on 06/05/2023. Patient Noticing having to clear her throat more often during the day. Other than that she is doing well, she goes to the gym 3-4 times a week.

## 2023-06-12 NOTE — Progress Notes (Signed)
 [facility]  [phone] [phone]  ID: Pamela Morales OB: 06-Oct-1940  MR#: [id number]  WUX#:[id number]  Patient Care Team: Clemens Curt, MD as PCP - General (Family Medicine) Thais Fill, MD as Consulting Physician (Dermatology) Dale Dubonnet, OD as Consulting Physician (Optometry) Boyce Byes, MD as Consulting Physician (General Surgery) Drake Gens, RN as Oncology Nurse Navigator Adrian Alba, Deadra Everts, MD as Consulting Physician (Oncology) Jonathan Neighbor, [facility] (Inactive) as Pharmacist (Pharmacist) Marc Senior, MD as Consulting Physician (Pulmonary Disease) Glenis Langdon, MD as Consulting Physician (Radiation Oncology)  CHIEF COMPLAINT: 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  INTERVAL HISTORY: Patient returns to clinic today for routine 20-month evaluation and discussion of her CT scan results.  She continues to remain active and goes to the gym 3-4 times per week. She has no neurologic complaints.  She denies any recent fevers or illnesses.  She has a good appetite and denies weight loss.  She denies any chest pain, shortness of breath, cough, hemoptysis.  She denies any nausea, vomiting, constipation, or diarrhea.  She has no urinary complaints.  Patient offers no specific complaints today.  REVIEW OF SYSTEMS:   Review of Systems  Constitutional: Negative.  Negative for fever, malaise/fatigue and weight loss.  Respiratory: Negative.  Negative for cough and shortness of breath.   Cardiovascular: Negative.  Negative for chest pain and leg swelling.  Gastrointestinal: Negative.  Negative for abdominal pain.  Genitourinary: Negative.  Negative for dysuria.  Musculoskeletal: Negative.  Negative for back pain.  Skin: Negative.  Negative for rash.  Neurological: Negative.  Negative for dizziness, focal weakness, weakness and headaches.  Psychiatric/Behavioral: Negative.  The patient is not nervous/anxious.      As per HPI. Otherwise, a complete review of systems is negative.  PAST MEDICAL HISTORY: Past Medical History:  Diagnosis Date   Allergy    Anemia    Anginal pain (HCC)    Aortic atherosclerosis (HCC)    Breast cancer (HCC)    C1 cervical fracture (HCC)    placed in c-collar-to f/u with neurosurgery on 06-21-21 in [place]   CA - cancer of ovary    Cancer (HCC) 02/2008   R breast  (est rec neg and brac neg)   Cataract    FORMING    Complication of anesthesia    deviated septum-lost memory for about 1 week after anesthesia   COPD (chronic obstructive pulmonary disease) (HCC)    DDD (degenerative disc disease)    in back with chronic pain   Depression    Diverticulosis    DOE (dyspnea on exertion)    Eczema    Elevated serum creatinine    sees nephrology yearly   Elevated TSH    Family history of colon cancer    GERD (gastroesophageal reflux disease)    Hearing loss    Hypertension    Hypertriglyceridemia    Mass of left lung    Migraines    Osteopenia    Psoriatic arthritis (HCC)    Rosacea    Squamous cell carcinoma of left lung (HCC)    Thyroid  nodule    Ventral hernia    Zoster 02/2007    PAST SURGICAL HISTORY: Past Surgical History:  Procedure Laterality Date   ABDOMINAL HYSTERECTOMY  1996   BSO fibroids, incidental ovarian CA finding   BASAL CELL CARCINOMA EXCISION  09/2016   bladder tack     BREAST SURGERY     rt total mastectomy  for breast CA   CHOLECYSTECTOMY  08/1996   10 yrs ago   COLONOSCOPY     lipoma removal  11/2005   NASAL SEPTUM SURGERY     POLYPECTOMY     VIDEO BRONCHOSCOPY WITH ENDOBRONCHIAL ULTRASOUND Left 06/16/2021   Procedure: POSSIBLE VIDEO BRONCHOSCOPY WITH ENDOBRONCHIAL ULTRASOUND;  Surgeon: Marc Senior, MD;  Location: [facility] ORS;  Service: Pulmonary;  Laterality: Left;   XI ROBOTIC ASSISTED VENTRAL HERNIA N/A 08/16/2022   Procedure: XI ROBOTIC ASSISTED VENTRAL HERNIA;  Surgeon: Emmalene Hare, MD;  Location: [facility] ORS;   Service: General;  Laterality: N/A;    FAMILY HISTORY: Family History  Problem Relation Age of Onset   Hypertension Mother    Heart disease Mother        CAD   Stomach cancer Mother 51   Lung cancer Father 69   Cancer Sister        colon, skin   Heart disease Sister 44       MI   Diabetes Sister    Colon cancer Sister    Head & neck cancer Sister 10   Lung cancer Sister    Melanoma Sister        multiple on head   Heart disease Brother        HTN and MI   Hypertension Brother    Cancer Maternal Aunt        unk type   Cancer Paternal Aunt        unk type   Cancer Paternal Uncle        unk type   Colon cancer Daughter 58   Esophageal cancer Neg Hx    Colon polyps Neg Hx     ADVANCED DIRECTIVES (Y/N):  N  HEALTH MAINTENANCE: Social History   Tobacco Use   Smoking status: Former    Current packs/day: 0.00    Average packs/day: 2.0 packs/day for 10.0 years (20.0 ttl pk-yrs)    Types: Cigarettes    Start date: 01/04/1980    Quit date: 01/03/1990    Years since quitting: 33.4    Passive exposure: Past   Smokeless tobacco: Never  Vaping Use   Vaping status: Never Used  Substance Use Topics   Alcohol use: Not Currently    Alcohol/week: 5.0 standard drinks of alcohol    Types: 5 Glasses of wine per week   Drug use: No     Colonoscopy:  PAP:  Bone density:  Lipid panel:  Allergies  Allergen Reactions   Sulfa Antibiotics     Pt unsure of reaction    Current Outpatient Medications  Medication Sig Dispense Refill   acetaminophen  (TYLENOL ) 500 MG tablet Take 2 tablets (1,000 mg total) by mouth every 6 (six) hours as needed for mild pain.     amLODipine  (NORVASC ) 10 MG tablet TAKE 1 TABLET BY MOUTH EVERY DAY 90 tablet 1   Calcium Carbonate-Vitamin D  (CALTRATE 600+D PO) Take 1,200 mg by mouth every evening.      famotidine  (PEPCID ) 20 MG tablet TAKE 1 TABLET BY MOUTH TWICE A DAY 180 tablet 1   FLUoxetine  (PROZAC ) 20 MG capsule TAKE 3 CAPSULES BY MOUTH EVERY DAY  270 capsule 1   losartan  (COZAAR ) 50 MG tablet TAKE 1 TABLET BY MOUTH EVERY DAY 90 tablet 1   MAGNESIUM PO Take 500 mg by mouth daily.     MELATONIN PO Take 10 mg by mouth at bedtime.     Multiple Vitamin (MULTIVITAMIN) tablet Take 1  tablet by mouth daily.     Multiple Vitamins-Minerals (PRESERVISION AREDS 2 PO) Take 1 capsule by mouth 2 (two) times daily.     OVER THE COUNTER MEDICATION Take 1 tablet by mouth daily. Jet Asleep     Secukinumab, 300 MG Dose, (COSENTYX, 300 MG DOSE,) 150 MG/ML SOSY Inject 1 application  as directed every 30 (thirty) days.     TRELEGY ELLIPTA  100-62.5-25 MCG/ACT AEPB INHALE 1 PUFF INTO THE LUNGS DAILY (Patient taking differently: Inhale 1 puff into the lungs every morning.) 60 each 11   No current facility-administered medications for this visit.    OBJECTIVE: Vitals:   06/12/23 1312  BP: 138/70  Pulse: (!) 58  Resp: 18  Temp: 97.6 F (36.4 C)  SpO2: 98%     Body mass index is 26.17 kg/m.    ECOG FS:0 - Asymptomatic  General: Well-developed, well-nourished, no acute distress. Eyes: Pink conjunctiva, anicteric sclera. HEENT: Normocephalic, moist mucous membranes. Lungs: No audible wheezing or coughing. Heart: Regular rate and rhythm. Abdomen: Soft, nontender, no obvious distention. Musculoskeletal: No edema, cyanosis, or clubbing. Neuro: Alert, answering all questions appropriately. Cranial nerves grossly intact. Skin: No rashes or petechiae noted. Psych: Normal affect.  LAB RESULTS:  Lab Results  Component Value Date   NA 138 08/08/2022   K 3.9 08/08/2022   CL 107 08/08/2022   CO2 22 08/08/2022   GLUCOSE 101 (H) 08/08/2022   BUN 28 (H) 08/08/2022   CREATININE 1.50 (H) 06/05/2023   CALCIUM 9.3 08/08/2022   PROT 6.9 04/06/2022   ALBUMIN 4.0 04/06/2022   AST 25 04/06/2022   ALT 20 04/06/2022   ALKPHOS 91 04/06/2022   BILITOT 0.3 04/06/2022   GFRNONAA 41 (L) 08/08/2022   GFRAA (L) 05/07/2008    56        The eGFR has been  calculated using the MDRD equation. This calculation has not been validated in all clinical situations. eGFR's persistently <60 mL/min signify possible Chronic Kidney Disease.    Lab Results  Component Value Date   WBC 8.7 08/08/2022   NEUTROABS 5.2 04/06/2022   HGB 10.9 (L) 08/08/2022   HCT 33.7 (L) 08/08/2022   MCV 83.4 08/08/2022   PLT 234 08/08/2022     STUDIES: CT Chest W Contrast Result Date: 06/05/2023 CLINICAL DATA:  Squamous cell carcinoma of left lung. Diagnosed in 2023 with radiation therapy completed in August of 2023. Remote history of breast cancer. History of melanoma. * Tracking Code: BO * EXAM: CT CHEST WITH CONTRAST TECHNIQUE: Multidetector CT imaging of the chest was performed during intravenous contrast administration. RADIATION DOSE REDUCTION: This exam was performed according to the departmental dose-optimization program which includes automated exposure control, adjustment of the mA and/or kV according to patient size and/or use of iterative reconstruction technique. CONTRAST:  60mL OMNIPAQUE  IOHEXOL  300 MG/ML  SOLN COMPARISON:  11/24/2022 FINDINGS: Cardiovascular: Aortic atherosclerosis. Tortuous thoracic aorta. Normal heart size, without pericardial effusion. No central pulmonary embolism, on this non-dedicated study. Pulmonary artery enlargement, outflow tract 3.3 cm. Mediastinum/Nodes: A left-sided thyroid  nodule was detailed on the prior including at 3.3 cm today. No supraclavicular adenopathy. Prominent pericardial recess in the pretracheal space. No mediastinal or hilar adenopathy. Lungs/Pleura: No pleural fluid. Tracheal deviation right from the left-sided thyroid  enlargement. Mild centrilobular emphysema. Lateral right lower lobe ground-glass nodule of 9 mm on 72/4 a be slightly more distinct than on the prior exam, measuring similarly. The lateral left apical pulmonary nodule measures 1.5 x 1.0 cm on 27/4  versus 1.8 x 1.1 cm on the prior. 1.3 cm craniocaudal on  coronal image 76 today versus 1.5 cm on the prior. Surrounding ground-glass and airspace disease are similar. Upper Abdomen: Cholecystectomy. Normal imaged portions of the liver, stomach, pancreas, adrenal glands, kidneys. Multiple subcentimeter hypoattenuating splenic lesions are similar back to at least 05/14/2021. Musculoskeletal: Right mastectomy and breast reconstruction. High left rib fractures are chronic and likely due to radiation induced insufficiency. Osteopenia. IMPRESSION: 1. Mild decreased in size a left apical pulmonary nodule with similar surrounding radiation change. 2. No thoracic adenopathy. 3. Right lower lobe ground-glass nodule measures similar at 9 mm but is slightly more distinct today. Recommend attention on follow-up. 4. Incidental findings, including: Aortic atherosclerosis (ICD10-I70.0), coronary artery atherosclerosis and emphysema (ICD10-J43.9). Pulmonary artery enlargement suggests pulmonary arterial hypertension. 5. Tiny hypoattenuating splenic lesions are unchanged, nonspecific. Most likely benign/incidental lesions such as hemangiomas. Electronically Signed   By: Lore Rode M.D.   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  Biopsy confirmed the results.  Surgical resection was discussed, but patient opted to pursue XRT only completing in August 2023.  Repeat PET scan on November 13, 2021 with essential resolution of hypermetabolism of the lesion.  Her most recent imaging on June 05, 2023 reviewed independently and report as above with no obvious evidence of recurrent or progressive disease.  No intervention is needed at this time.  Return to clinic in 6 months with repeat imaging and further evaluation at which point patient can likely be transition to  yearly imaging.   History of breast cancer: Greater than 15 years ago.  Patient states she underwent mastectomy, but did not receive any XRT, chemotherapy, or hormonal treatment. History of ovarian cancer: Patient reports this was an incidental on pathology after having a total hysterectomy. History of melanoma: Unclear depth or stage. Thyroid  nodule: Reported stable.  Monitor. Genetics: Patient was previously given a referral to genetics, but unclear if this was ever completed.  I spent a total of 20 minutes reviewing chart data, face-to-face evaluation with the patient, counseling and coordination of care as detailed above.   Patient expressed understanding and was in agreement with this plan. She also understands that She can call clinic at any time with any questions, concerns, or complaints.    Cancer Staging  Squamous cell carcinoma of left lung [facility]) Staging form: Lung, AJCC 8th Edition - Clinical stage from 06/23/2021: Stage IB (cT2a, cN0, cM0) - Signed by Shellie Dials, MD on 06/23/2021   Shellie Dials, MD   06/12/2023 1:21 PM

## 2023-06-14 IMAGING — US US RENAL
1 series · 14 of 25 positions shown · non-contrast
Comparison: None.

CLINICAL DATA: Hypertension.  Chronic renal disease.

EXAM:
RENAL / URINARY TRACT ULTRASOUND COMPLETE

[Series 1: us renal · 0.19mm/px · 14 of 52 slices shown]
[im 1/52]
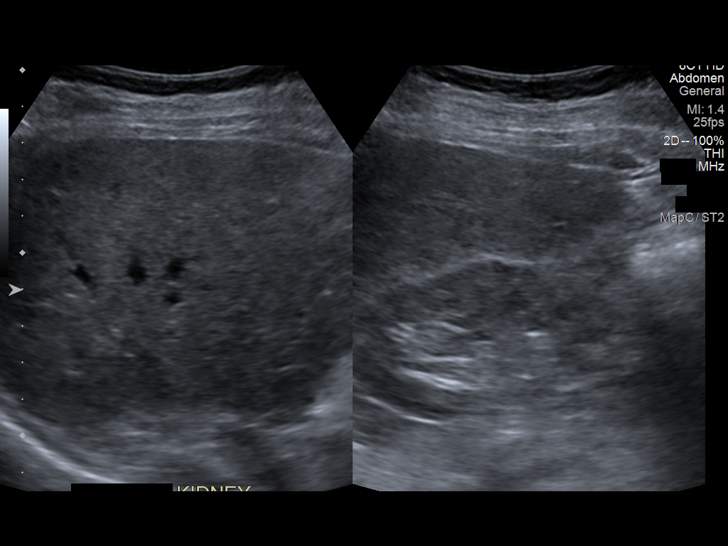
[im 5/52]
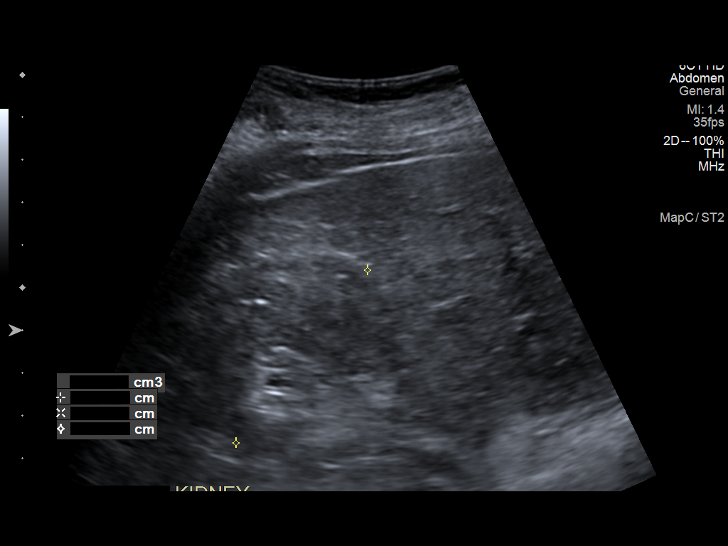
[im 9/52]
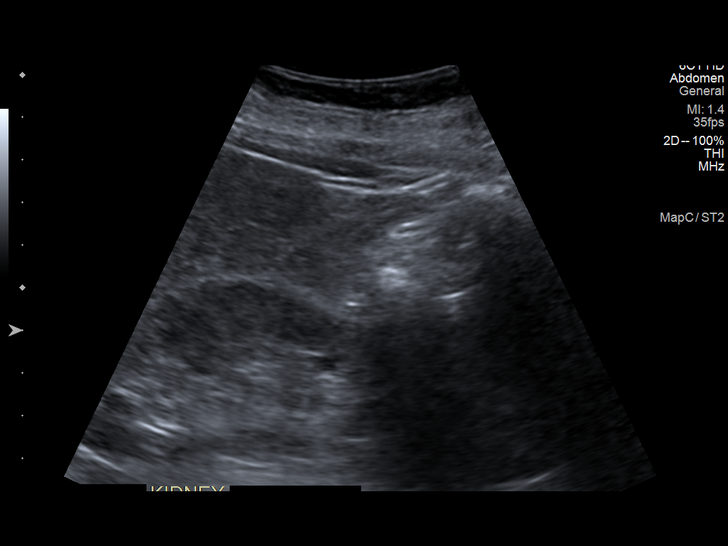
[im 13/52]
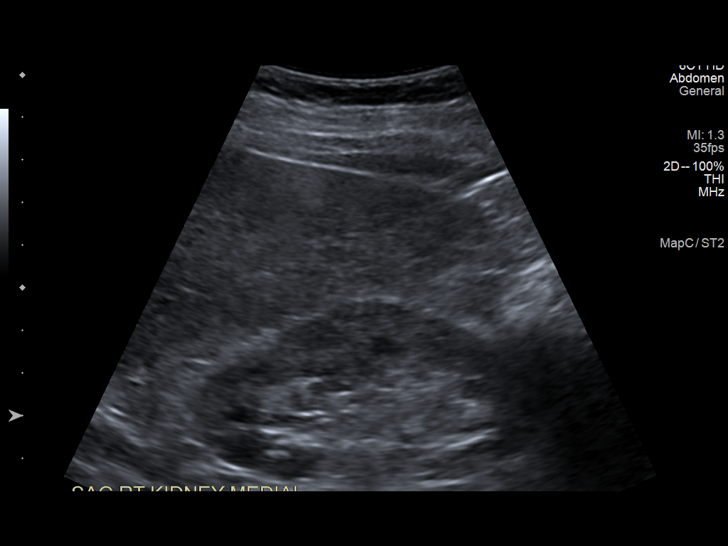
[im 18/52]
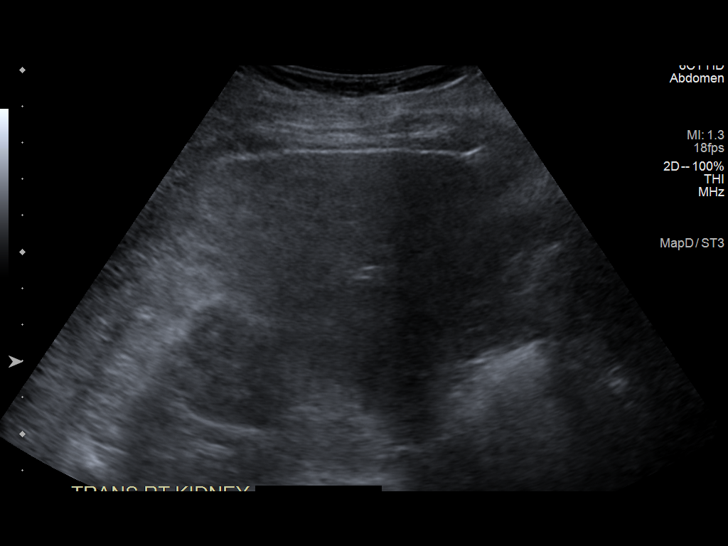
[im 20/52]
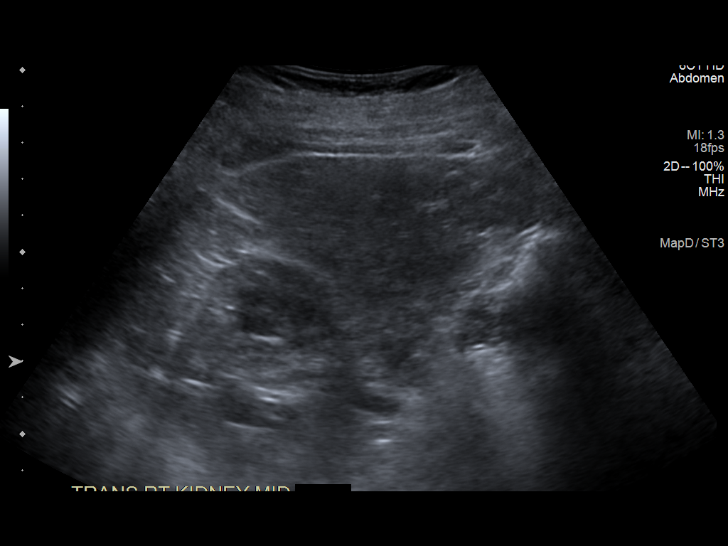
[im 24/52]
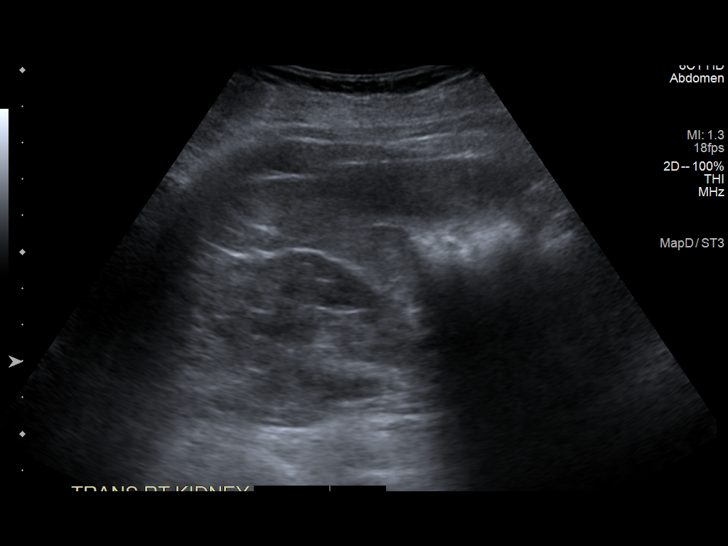
[im 28/52]
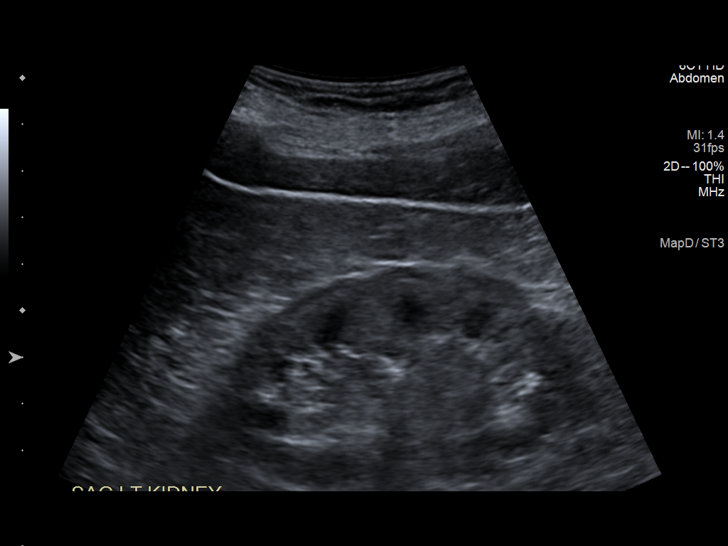
[im 32/52]
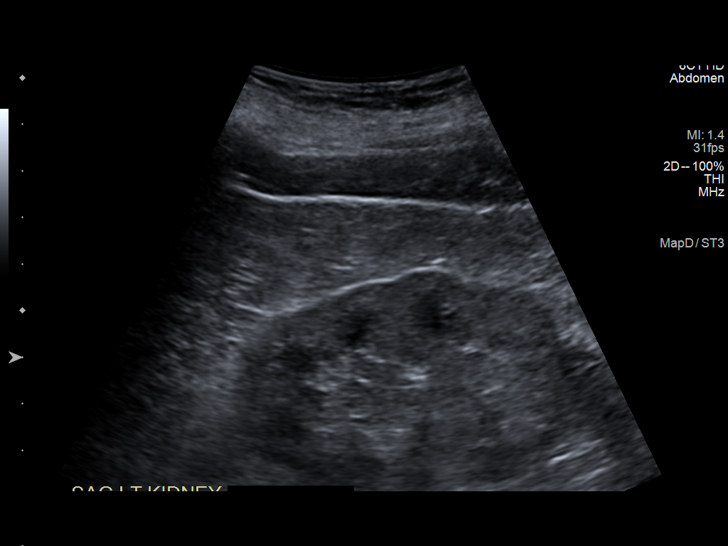
[im 35/52]
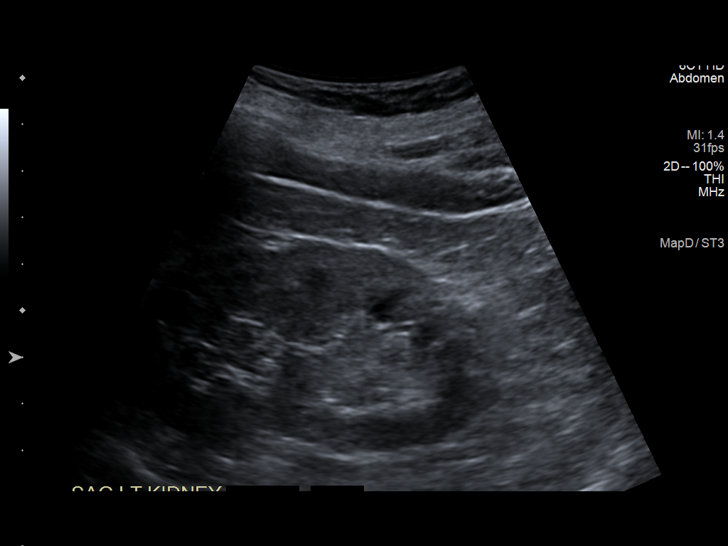
[im 39/52]
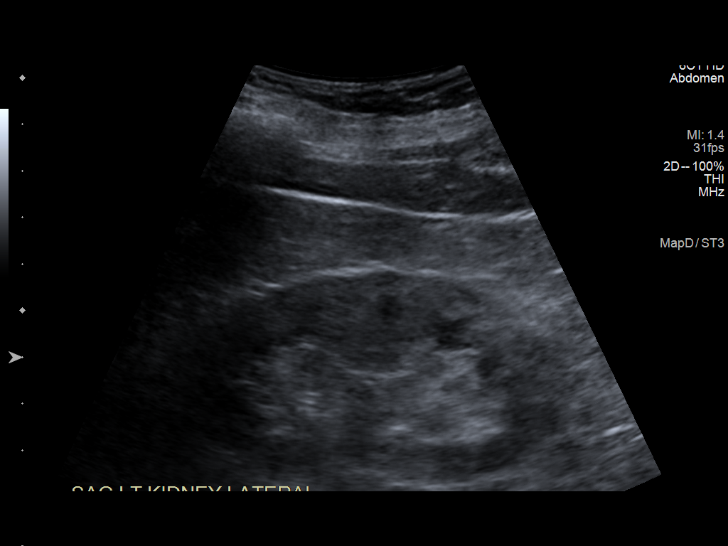
[im 43/52]
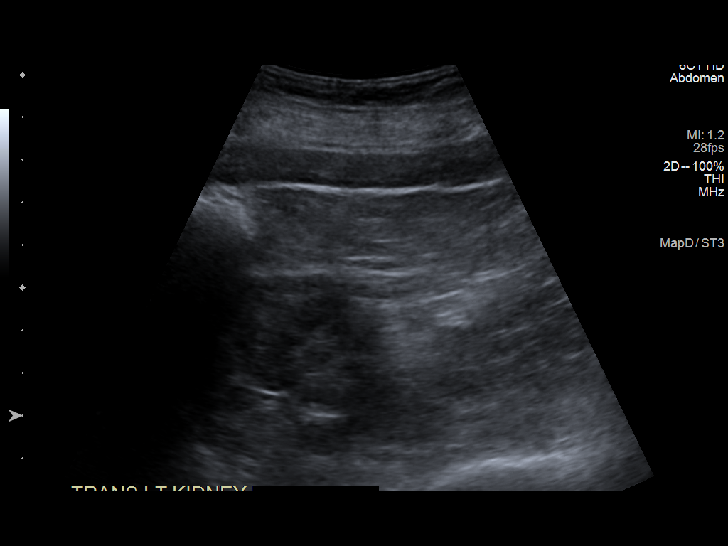
[im 47/52]
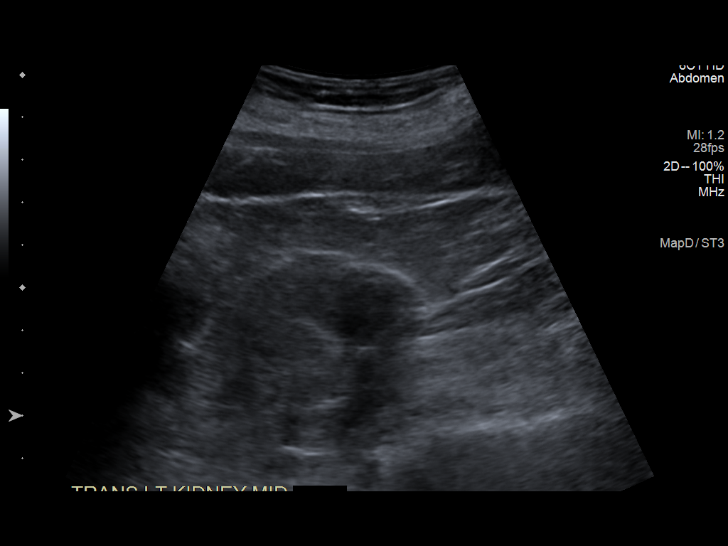
[im 52/52]
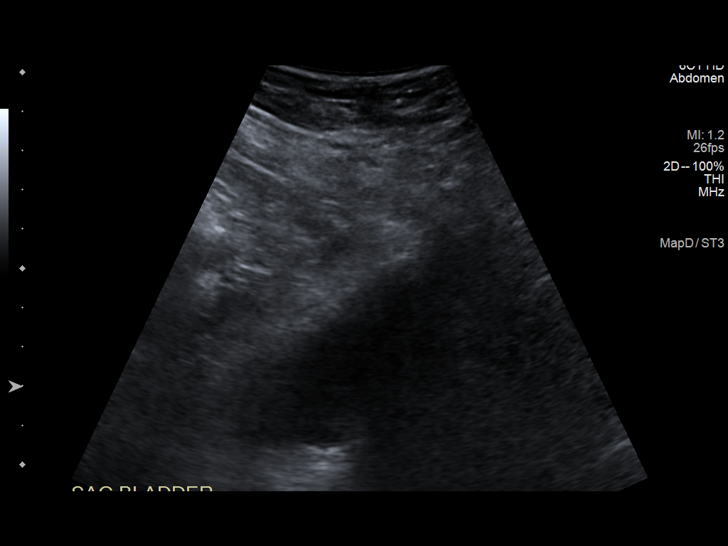

[14 of 25 positions shown; findings below may reference images not displayed]

FINDINGS: Right Kidney:

Renal measurements: 8.7 x 4.5 x 5.1 cm = volume: 103.8 mL. Increased
cortical echogenicity.

Left Kidney:

Renal measurements: 7.9 x 4.6 x 3.5 cm = volume: 66.5 mL. Increased
cortical echogenicity.

Bladder:

Poor evaluation of the bladder due to lack of distention. No gross
abnormalities.

Other:

None.
IMPRESSION: 1. The kidneys are smaller than normal with diffuse increased
cortical echogenicity consistent with medical renal disease.
2. Poor evaluation of the bladder due to lack of distention. No
gross bladder abnormality.

## 2023-06-23 ENCOUNTER — Other Ambulatory Visit: Payer: Self-pay | Admitting: Family Medicine

## 2023-06-23 NOTE — Telephone Encounter (Signed)
 Med refilled once.  Pt is due for her CPE (labs prior) or a least a med refill f/u appt. Please schedule thanks

## 2023-06-26 NOTE — Telephone Encounter (Signed)
 Patient is scheduled

## 2023-07-11 ENCOUNTER — Ambulatory Visit: Payer: Self-pay

## 2023-07-11 NOTE — Telephone Encounter (Signed)
 FYI Only or Action Required?: FYI only for provider.  Patient was last seen in primary care on 07/29/2022 by Randeen Laine LABOR, MD.  Called Nurse Triage reporting No chief complaint on file..  Symptoms began yesterday.  Interventions attempted: Nothing.  Symptoms are: unchanged.  Triage Disposition: See PCP When Office is Open (Within 3 Days)  Patient/caregiver understands and will follow disposition?: Yes  Copied from CRM [id number]. Topic: Clinical - Red Word Triage >> Jul 11, 2023  2:16 PM Adrionna Y wrote: Red Word that prompted transfer to Nurse Triage: fall  Patient was getting attacked by bees and fell Reason for Disposition  [1] Hives, itching, or swelling elsewhere on body (i.e., not a site of stings) AND [2] started over 2 hours after sting  (Exception: Only at site of sting.)  MILD weakness (i.e., does not interfere with ability to work, go to school, normal activities)  (Exception: Mild weakness is a chronic symptom.)  Answer Assessment - Initial Assessment Questions 1. MECHANISM: How did the fall happen?     Running from a bee, fell  2. DOMESTIC VIOLENCE AND ELDER ABUSE SCREENING: Did you fall because someone pushed you or tried to hurt you? If Yes, ask: Are you safe now?     No  3. ONSET: When did the fall happen? (e.g., minutes, hours, or days ago)     Today  4. LOCATION: What part of the body hit the ground? (e.g., back, buttocks, head, hips, knees, hands, head, stomach)     Flat on her back  5. INJURY: Did you hurt (injure) yourself when you fell? If Yes, ask: What did you injure? Tell me more about this? (e.g., body area; type of injury; pain severity)     No  6. PAIN: Is there any pain? If Yes, ask: How bad is the pain? (e.g., Scale 1-10; or mild,  moderate, severe)   - NONE (0): No pain   - MILD (1-3): Doesn't interfere with normal activities    - MODERATE (4-7): Interferes with normal activities or awakens from sleep    - SEVERE (8-10):  Excruciating pain, unable to do any normal activities      Yes,  10 with movement, 0 while sitting still  7. SIZE: For cuts, bruises, or swelling, ask: How large is it? (e.g., inches or centimeters)      None  8. PREGNANCY: Is there any chance you are pregnant? When was your last menstrual period?     No and no  9. OTHER SYMPTOMS: Do you have any other symptoms? (e.g., dizziness, fever, weakness; new onset or worsening).      No  10. CAUSE: What do you think caused the fall (or falling)? (e.g., tripped, dizzy spell)       Slipped while running  Answer Assessment - Initial Assessment Questions 1. TYPE: What type of sting was it? (bee, yellow jacket, etc.)      Bee  2. ONSET: When did it occur?      Today  3. LOCATION: Where is the sting located?  How many stings?     Three on the arm, One on the thigh, and one on the finger  4. SWELLING SIZE: How big is the swelling? (e.g., inches or cm)     Typical swelling  5. REDNESS: Is the area red or pink? If Yes, ask: What size is area of redness? (e.g., inches or cm). When did the redness start?     Red  6. PAIN: Is  there any pain? If Yes, ask: How bad is it?  (Scale 1-10; or mild, moderate, severe)     No  7. ITCHING: Is there any itching? If Yes, ask: How bad is it?      Yes  8. RESPIRATORY DISTRESS: Describe your breathing.      No  9. PRIOR REACTIONS: Have you had any severe allergic reactions to stings in the past? if yes, ask: What happened?     No  10. OTHER SYMPTOMS: Do you have any other symptoms? (e.g., abdomen pain, face or tongue swelling, new rash elsewhere, vomiting)       No  11. PREGNANCY: Is there any chance you are pregnant? When was your last menstrual period?       No and No  Protocols used: Bee or Yellow Jacket Sting-A-AH, Falls and Falling-A-AH

## 2023-07-11 NOTE — Telephone Encounter (Signed)
 Fyi for appt tomorrow with Dr. Avelina

## 2023-07-11 NOTE — Telephone Encounter (Signed)
 Aware, will watch for correspondence Thanks for seeing her

## 2023-07-12 ENCOUNTER — Ambulatory Visit: Payer: Self-pay | Admitting: Family Medicine

## 2023-07-12 ENCOUNTER — Ambulatory Visit (INDEPENDENT_AMBULATORY_CARE_PROVIDER_SITE_OTHER)
Admission: RE | Admit: 2023-07-12 | Discharge: 2023-07-12 | Disposition: A | Discharge status: Home / Self Care | Source: Ambulatory Visit | Attending: Family Medicine | Admitting: Family Medicine

## 2023-07-12 ENCOUNTER — Encounter: Payer: Self-pay | Admitting: Family Medicine

## 2023-07-12 ENCOUNTER — Ambulatory Visit (INDEPENDENT_AMBULATORY_CARE_PROVIDER_SITE_OTHER): Admitting: Family Medicine

## 2023-07-12 VITALS — BP 124/64 | HR 58 | Temp 98.3°F | Ht 60.0 in | Wt 132.0 lb

## 2023-07-12 DIAGNOSIS — I700 Atherosclerosis of aorta: Secondary | ICD-10-CM | POA: Diagnosis not present

## 2023-07-12 DIAGNOSIS — G8911 Acute pain due to trauma: Secondary | ICD-10-CM

## 2023-07-12 DIAGNOSIS — M545 Low back pain, unspecified: Secondary | ICD-10-CM

## 2023-07-12 DIAGNOSIS — T63441A Toxic effect of venom of bees, accidental (unintentional), initial encounter: Secondary | ICD-10-CM | POA: Diagnosis not present

## 2023-07-12 DIAGNOSIS — M4317 Spondylolisthesis, lumbosacral region: Secondary | ICD-10-CM | POA: Diagnosis not present

## 2023-07-12 DIAGNOSIS — M5136 Other intervertebral disc degeneration, lumbar region with discogenic back pain only: Secondary | ICD-10-CM | POA: Diagnosis not present

## 2023-07-12 MED ORDER — CYCLOBENZAPRINE HCL 10 MG PO TABS: 5.0000 mg | ORAL_TABLET | Freq: Every evening | ORAL | 0 refills | Status: DC | PRN

## 2023-07-12 MED ORDER — PREDNISONE 20 MG PO TABS: ORAL_TABLET | ORAL | 0 refills | Status: DC

## 2023-07-12 NOTE — Assessment & Plan Note (Signed)
 Acute, status post fall.  Given fall and patient age, history of osteopenia, despite lack of focal vertebral tenderness we will check a plain x-ray to rule out fracture. Will treat with prednisone  taper both for reaction to bee sting as well as inflammation and pain in the low back.  She will use muscle relaxant at night.  Start heat on low back and gentle home physical therapy.  If not improving as expected or new neurologic symptoms like incontinence, numbness and weakness in legs, patient was instructed to follow-up.  If severe pain go to the emergency room.

## 2023-07-12 NOTE — Assessment & Plan Note (Signed)
 Acute, multiple stings with associated local reaction.  Will use prednisone  taper to help with itching.  No facial or oral swelling

## 2023-07-12 NOTE — Progress Notes (Signed)
 Patient ID: Pamela Morales, female    DOB: 11/12/1940, 82 y.o.   MRN: [id number]  This visit was conducted in person.  BP 124/64   Pulse (!) 58   Temp 98.3 F (36.8 C) (Oral)   Ht 5' (1.524 m)   Wt 132 lb (59.9 kg)   SpO2 97%   BMI 25.78 kg/m    CC:  Chief Complaint  Patient presents with   Fall    She slipped in the wet grass when she was being stung by the bee   Insect Bite    Stung by a bee mostly on the right side. 3 stings on right arm and 2 stings on right leg    Subjective:   HPI: Pamela Morales is a 83 y.o. female presenting on 07/12/2023 for Fall (She slipped in the wet grass when she was being stung by the bee) and Insect Bite (Stung by a bee mostly on the right side. 3 stings on right arm and 2 stings on right leg)  Patient of Dr. Elsworth  She reports 3-4 days ago following a fall that occurred when she was slipped on the wet grass while being stung by a bee on her right arm and right leg.  She reports she landed  flat on back .[place].   Has itching at bites.   She has been having pain, lumbar back.  No radiation of pain.  Feeling stiff.  Pain with walking,bending.  HAs been using tylenol  as needed.. helps minimally.   No new numbness and weakness in legs.    No past back issues or surgeries.  Goes to gym 4-5 days a week.   DEXA 2023 osteopenia       Relevant past medical, surgical, family and social history reviewed and updated as indicated. Interim medical history since our last visit reviewed. Allergies and medications reviewed and updated. Outpatient Medications Prior to Visit  Medication Sig Dispense Refill   acetaminophen  (TYLENOL ) 500 MG tablet Take 2 tablets (1,000 mg total) by mouth every 6 (six) hours as needed for mild pain.     amLODipine  (NORVASC ) 10 MG tablet TAKE 1 TABLET BY MOUTH EVERY DAY 90 tablet 1   Calcium Carbonate-Vitamin D  (CALTRATE 600+D PO) Take 1,200 mg by mouth every evening.      famotidine  (PEPCID ) 20 MG  tablet TAKE 1 TABLET BY MOUTH TWICE A DAY 180 tablet 1   FLUoxetine  (PROZAC ) 20 MG capsule TAKE 3 CAPSULES BY MOUTH EVERY DAY 270 capsule 0   losartan  (COZAAR ) 50 MG tablet TAKE 1 TABLET BY MOUTH EVERY DAY 90 tablet 0   MAGNESIUM PO Take 500 mg by mouth daily.     MELATONIN PO Take 10 mg by mouth at bedtime.     Multiple Vitamin (MULTIVITAMIN) tablet Take 1 tablet by mouth daily.     Multiple Vitamins-Minerals (PRESERVISION AREDS 2 PO) Take 1 capsule by mouth 2 (two) times daily.     OVER THE COUNTER MEDICATION Take 1 tablet by mouth daily. Jet Asleep     Secukinumab, 300 MG Dose, (COSENTYX, 300 MG DOSE,) 150 MG/ML SOSY Inject 1 application  as directed every 30 (thirty) days.     TRELEGY ELLIPTA  100-62.5-25 MCG/ACT AEPB INHALE 1 PUFF INTO THE LUNGS DAILY (Patient taking differently: Inhale 1 puff into the lungs every morning.) 60 each 11   No facility-administered medications prior to visit.     Per HPI unless specifically indicated in ROS section below  Review of Systems  Constitutional:  Negative for fatigue and fever.  HENT:  Negative for congestion.   Eyes:  Negative for pain.  Respiratory:  Negative for cough and shortness of breath.   Cardiovascular:  Negative for chest pain, palpitations and leg swelling.  Gastrointestinal:  Negative for abdominal pain.  Genitourinary:  Negative for dysuria and vaginal bleeding.  Musculoskeletal:  Positive for back pain. Negative for neck pain and neck stiffness.  Neurological:  Negative for syncope, light-headedness and headaches.  Psychiatric/Behavioral:  Negative for dysphoric mood.    Objective:  BP 124/64   Pulse (!) 58   Temp 98.3 F (36.8 C) (Oral)   Ht 5' (1.524 m)   Wt 132 lb (59.9 kg)   SpO2 97%   BMI 25.78 kg/m   Wt Readings from Last 3 Encounters:  07/12/23 132 lb (59.9 kg)  06/12/23 134 lb (60.8 kg)  12/05/22 136 lb (61.7 kg)      Physical Exam Constitutional:      General: She is not in acute distress.    Appearance:  Normal appearance. She is well-developed. She is not ill-appearing or toxic-appearing.  HENT:     Head: Normocephalic.     Right Ear: Hearing, tympanic membrane, ear canal and external ear normal. Tympanic membrane is not erythematous, retracted or bulging.     Left Ear: Hearing, tympanic membrane, ear canal and external ear normal. Tympanic membrane is not erythematous, retracted or bulging.     Nose: No mucosal edema or rhinorrhea.     Right Sinus: No maxillary sinus tenderness or frontal sinus tenderness.     Left Sinus: No maxillary sinus tenderness or frontal sinus tenderness.     Mouth/Throat:     Pharynx: Uvula midline.  Eyes:     General: Lids are normal. Lids are everted, no foreign bodies appreciated.     Conjunctiva/sclera: Conjunctivae normal.     Pupils: Pupils are equal, round, and reactive to light.  Neck:     Thyroid : No thyroid  mass or thyromegaly.     Vascular: No carotid bruit.     Trachea: Trachea normal.  Cardiovascular:     Rate and Rhythm: Normal rate and regular rhythm.     Pulses: Normal pulses.     Heart sounds: Normal heart sounds, S1 normal and S2 normal. No murmur heard.    No friction rub. No gallop.  Pulmonary:     Effort: Pulmonary effort is normal. No tachypnea or respiratory distress.     Breath sounds: Normal breath sounds. No decreased breath sounds, wheezing, rhonchi or rales.  Abdominal:     General: Bowel sounds are normal.     Palpations: Abdomen is soft.     Tenderness: There is no abdominal tenderness.  Musculoskeletal:     Cervical back: Normal, normal range of motion and neck supple.     Thoracic back: Normal.     Lumbar back: Signs of trauma, spasms and tenderness present. No swelling, edema, lacerations or bony tenderness. Decreased range of motion. Negative right straight leg raise test and negative left straight leg raise test.  Skin:    General: Skin is warm and dry.     Findings: No rash.  Neurological:     Mental Status: She is  alert.  Psychiatric:        Mood and Affect: Mood is not anxious or depressed.        Speech: Speech normal.        Behavior: Behavior normal.  Behavior is cooperative.        Thought Content: Thought content normal.        Judgment: Judgment normal.       Results for orders placed or performed during the hospital encounter of 06/05/23  I-STAT creatinine   Collection Time: 06/05/23  1:39 PM  Result Value Ref Range   Creatinine, Ser 1.50 (H) 0.44 - 1.00 mg/dL    Assessment and Plan  There are no diagnoses linked to this encounter.  No follow-ups on file.   Greig Ring, MD

## 2023-07-18 DIAGNOSIS — Z79899 Other long term (current) drug therapy: Secondary | ICD-10-CM | POA: Diagnosis not present

## 2023-07-18 DIAGNOSIS — L409 Psoriasis, unspecified: Secondary | ICD-10-CM | POA: Diagnosis not present

## 2023-07-18 DIAGNOSIS — M545 Low back pain, unspecified: Secondary | ICD-10-CM | POA: Diagnosis not present

## 2023-07-18 DIAGNOSIS — R5383 Other fatigue: Secondary | ICD-10-CM | POA: Diagnosis not present

## 2023-07-18 DIAGNOSIS — M1991 Primary osteoarthritis, unspecified site: Secondary | ICD-10-CM | POA: Diagnosis not present

## 2023-07-18 DIAGNOSIS — Z6823 Body mass index (BMI) 23.0-23.9, adult: Secondary | ICD-10-CM | POA: Diagnosis not present

## 2023-07-18 DIAGNOSIS — L4050 Arthropathic psoriasis, unspecified: Secondary | ICD-10-CM | POA: Diagnosis not present

## 2023-07-19 ENCOUNTER — Encounter: Payer: Self-pay | Admitting: Family Medicine

## 2023-07-19 ENCOUNTER — Ambulatory Visit (INDEPENDENT_AMBULATORY_CARE_PROVIDER_SITE_OTHER): Admitting: Family Medicine

## 2023-07-19 VITALS — BP 128/68 | HR 62 | Temp 97.6°F | Ht 60.0 in | Wt 131.2 lb

## 2023-07-19 DIAGNOSIS — S32040D Wedge compression fracture of fourth lumbar vertebra, subsequent encounter for fracture with routine healing: Secondary | ICD-10-CM | POA: Insufficient documentation

## 2023-07-19 DIAGNOSIS — M8580 Other specified disorders of bone density and structure, unspecified site: Secondary | ICD-10-CM | POA: Diagnosis not present

## 2023-07-19 DIAGNOSIS — N1832 Chronic kidney disease, stage 3b: Secondary | ICD-10-CM

## 2023-07-19 MED ORDER — ALENDRONATE SODIUM 70 MG PO TABS: 70.0000 mg | ORAL_TABLET | ORAL | 3 refills | Status: DC

## 2023-07-19 MED ORDER — CALCITONIN (SALMON) 200 UNIT/ACT NA SOLN: 1.0000 | Freq: Every day | NASAL | 0 refills | Status: DC

## 2023-07-19 NOTE — Assessment & Plan Note (Addendum)
 Now with L4 comp fracture (from hard fall/not a fragility fracture )  Will treatment pain with a month of miacalcin   ? If candidate for alendronate  again - based on renal fxn Bmet pending   Discussed fall prevention, supplements and exercise for bone density   Pt is active and fit and takes ca and D

## 2023-07-19 NOTE — Patient Instructions (Addendum)
 Use the miacalcin nasal spray as directed (alternate nostrils daily) for the next month This may help with the pain of the vertebral fracture   I want to start back on alendronate  if your kidney function is good enough (or we will have to consider something else)  Do not pick this up yet (just get the miacalcin)  We will check labs today    I put the referral in for an orthopedic specialist  Please let us  know if you don't hear in 1-2 weeks to set that up  Please keep me posted with how pain is in next few days

## 2023-07-19 NOTE — Progress Notes (Signed)
 Subjective:    Patient ID: Pamela Morales, female    DOB: Mar 27, 1940, 83 y.o.   MRN: [id number]  HPI  Wt Readings from Last 3 Encounters:  07/19/23 131 lb 4 oz (59.5 kg)  07/12/23 132 lb (59.9 kg)  06/12/23 134 lb (60.8 kg)   25.63 kg/m  Vitals:   07/19/23 1358  BP: 128/68  Pulse: 62  Temp: 97.6 F (36.4 C)  SpO2: 98%   Pt presents for follow up of back pain after trauma   Saw Dr Avelina on 7/9  Fell on grass 3-4 d prior to that after insect sting   (fall was about 10 days ago)   Noted vertebral tenderness  Given prednisone  for sting and pain   Imaging  DG Lumbar Spine Complete Result Date: 07/12/2023 CLINICAL DATA:  Low back pain after fall. EXAM: LUMBAR SPINE - COMPLETE 4+ VIEW COMPARISON:  March 30, 2007. FINDINGS: Interval development of moderate compression deformity of L4 vertebral body concerning for fracture of indeterminate age. Mild grade 1 anterolisthesis of L5-S1 is noted due to bilateral L5 spondylolysis. Mild degenerative disc disease is noted at L2-3. IMPRESSION: Interval development of moderate compression deformity of L4 vertebral body concerning for fracture of indeterminate age. MRI may be performed for further evaluation. Aortic Atherosclerosis (ICD10-I70.0). Electronically Signed   By: Lynwood Landy Raddle M.D.   On: 07/12/2023 12:38    Noted mod compression def of L4 vert body concerning for fracture   Last dexa 05/2021  TS -2.0 in spine   Pain is moderate-not getting better Worse when she tries to get up  Sharp and dull  Not shooting down her leg  Tylenol  is not touching it   Lab Results  Component Value Date   NA 138 08/08/2022   K 3.9 08/08/2022   CO2 22 08/08/2022   GLUCOSE 101 (H) 08/08/2022   BUN 28 (H) 08/08/2022   CREATININE 1.50 (H) 06/05/2023   CALCIUM 9.3 08/08/2022   GFR 42.45 (L) 04/06/2022   EGFR 38.0 07/28/2022   GFRNONAA 41 (L) 08/08/2022      Patient Active Problem List   Diagnosis Date Noted   Closed compression fracture  of L4 lumbar vertebra, with routine healing, subsequent encounter 07/19/2023   Acute low back pain due to trauma 07/12/2023   Bee sting 07/12/2023   Pre-operative clearance 07/30/2022   Ventral incisional hernia 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 gangrene 04/06/2022   Pain of left scapula 04/06/2022   Primary localized osteoarthrosis of multiple sites 08/11/2021   Other long term (current) drug therapy 08/11/2021   Genetic testing 07/07/2021   Squamous cell carcinoma of left lung (HCC) 06/18/2021   Personal history of ovarian cancer 06/15/2021   Family history of melanoma 06/15/2021   Family history of stomach cancer 06/15/2021   Mass of left lung 05/14/2021   CRI (chronic renal insufficiency) 10/23/2020   Left foot pain 01/27/2020   High triglycerides 07/16/2019   COPD (chronic obstructive pulmonary disease) (HCC) 07/12/2018   Elevated TSH 10/18/2016   Elevated serum creatinine 10/18/2016   Estrogen deficiency 03/23/2016   Psoriatic arthritis (HCC) 03/31/2015   Heartburn 03/03/2015   Fatigue 04/28/2014   Family history of colon cancer 01/01/2013   History of colonic polyps 01/01/2013   Anemia 12/19/2012   Hearing loss 09/19/2012   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   BACK PAIN WITH RADICULOPATHY 03/15/2007   History of herpes zoster 02/23/2007   Essential hypertension 02/05/2007   Osteopenia 02/05/2007  ECZEMA, ATOPIC 01/29/2007   ROSACEA 01/29/2007   BASAL CELL CARCINOMA, HX OF 01/29/2007   Past Medical History:  Diagnosis Date   Allergy    Anemia    Anginal pain (HCC)    Anxiety    Aortic atherosclerosis (HCC)    Breast cancer (HCC) 2006?   C1 cervical fracture (HCC)    placed in c-collar-to f/u with neurosurgery on 06-21-21 in [place]   CA - cancer of ovary    Cancer (HCC) 02/10   R breast  (est rec neg and brac neg)   Cataract    FORMING    Complication of anesthesia    deviated septum-lost memory for about 1 week after anesthesia   COPD (chronic  obstructive pulmonary disease) (HCC)    DDD (degenerative disc disease)    in back with chronic pain   Depression    Diverticulosis    DOE (dyspnea on exertion)    Eczema    Elevated serum creatinine    sees nephrology yearly   Elevated TSH    Emphysema of lung (HCC)    Family history of colon cancer    GERD (gastroesophageal reflux disease)    Hearing loss    Hypertension    Hypertriglyceridemia    Mass of left lung    Migraines    Osteopenia    Psoriatic arthritis (HCC)    Rosacea    Skin cancer 2000?   Squamous cell carcinoma of left lung (HCC)    Thyroid  nodule    Ventral hernia    Zoster 02/2007   Past Surgical History:  Procedure Laterality Date   ABDOMINAL HYSTERECTOMY  1996   BSO fibroids, incidental ovarian CA finding   BASAL CELL CARCINOMA EXCISION  09/2016   bladder tack     BREAST SURGERY     rt total mastectomy for breast CA   CHOLECYSTECTOMY  08/1996   10 yrs ago   COLONOSCOPY     EYE SURGERY     lipoma removal  11/2005   NASAL SEPTUM SURGERY     POLYPECTOMY     VIDEO BRONCHOSCOPY WITH ENDOBRONCHIAL ULTRASOUND Left 06/16/2021   Procedure: POSSIBLE VIDEO BRONCHOSCOPY WITH ENDOBRONCHIAL ULTRASOUND;  Surgeon: Tamea Dedra CROME, MD;  Location: [facility];  Service: Pulmonary;  Laterality: Left;   XI ROBOTIC ASSISTED VENTRAL HERNIA N/A 08/16/2022   Procedure: XI ROBOTIC ASSISTED VENTRAL HERNIA;  Surgeon: Desiderio Schanz, MD;  Location: [facility] OR[facility];  Service: General;  Laterality: N/A;   Social History   Tobacco Use   Smoking status: Former    Current packs/day: 0.00    Average packs/day: 2.0 packs/day for 10.0 years (20.0 ttl pk-yrs)    Types: Cigarettes    Start date: 01/04/1980    Quit date: 01/03/1990    Years since quitting: 33.5    Passive exposure: Past   Smokeless tobacco: Never  Vaping Use   Vaping status: Never Used  Substance Use Topics   Alcohol use: Not Currently    Alcohol/week: 5.0 standard drinks of alcohol    Types: 5 Glasses of wine per  week   Drug use: No   Family History  Problem Relation Age of Onset   Hypertension Mother    Heart disease Mother        CAD   Stomach cancer Mother 23   Cancer Mother    Lung cancer Father 81   Cancer Father    Early death Father    Cancer Sister  colon, skin   Heart disease Sister 61       MI   Diabetes Sister    Colon cancer Sister    Head & neck cancer Sister 29   Lung cancer Sister    Melanoma Sister        multiple on head   Heart disease Brother        HTN and MI   Hypertension Brother    Kidney disease Brother    Cancer Maternal Aunt        unk type   Cancer Paternal Aunt        unk type   Cancer Paternal Uncle        unk type   Colon cancer Daughter 20   Cancer Son    Cancer Sister    Hypertension Sister    Heart disease Daughter    Esophageal cancer Neg Hx    Colon polyps Neg Hx    Allergies  Allergen Reactions   Sulfa Antibiotics     Pt unsure of reaction   Current Outpatient Medications on File Prior to Visit  Medication Sig Dispense Refill   acetaminophen  (TYLENOL ) 500 MG tablet Take 2 tablets (1,000 mg total) by mouth every 6 (six) hours as needed for mild pain.     amLODipine  (NORVASC ) 10 MG tablet TAKE 1 TABLET BY MOUTH EVERY DAY 90 tablet 1   Calcium Carbonate-Vitamin D  (CALTRATE 600+D PO) Take 1,200 mg by mouth every evening.      cyclobenzaprine  (FLEXERIL ) 10 MG tablet Take 0.5-1 tablets (5-10 mg total) by mouth at bedtime as needed. 15 tablet 0   famotidine  (PEPCID ) 20 MG tablet TAKE 1 TABLET BY MOUTH TWICE A DAY 180 tablet 1   FLUoxetine  (PROZAC ) 20 MG capsule TAKE 3 CAPSULES BY MOUTH EVERY DAY 270 capsule 0   losartan  (COZAAR ) 50 MG tablet TAKE 1 TABLET BY MOUTH EVERY DAY 90 tablet 0   MAGNESIUM PO Take 500 mg by mouth daily.     MELATONIN PO Take 10 mg by mouth at bedtime.     Multiple Vitamin (MULTIVITAMIN) tablet Take 1 tablet by mouth daily.     Multiple Vitamins-Minerals (PRESERVISION AREDS 2 PO) Take 1 capsule by mouth 2  (two) times daily.     OVER THE COUNTER MEDICATION Take 1 tablet by mouth daily. Jet Asleep     predniSONE  (DELTASONE ) 20 MG tablet 3 tabs by mouth daily x 3 days, then 2 tabs by mouth daily x 2 days then 1 tab by mouth daily x 2 days 15 tablet 0   Secukinumab, 300 MG Dose, (COSENTYX, 300 MG DOSE,) 150 MG/ML SOSY Inject 1 application  as directed every 30 (thirty) days.     TRELEGY ELLIPTA  100-62.5-25 MCG/ACT AEPB INHALE 1 PUFF INTO THE LUNGS DAILY (Patient taking differently: Inhale 1 puff into the lungs every morning.) 60 each 11   No current facility-administered medications on file prior to visit.    Review of Systems  Constitutional:  Negative for activity change, appetite change, fatigue, fever and unexpected weight change.  HENT:  Negative for congestion, ear pain, rhinorrhea, sinus pressure and sore throat.   Eyes:  Negative for pain, redness and visual disturbance.  Respiratory:  Negative for cough, shortness of breath and wheezing.   Cardiovascular:  Negative for chest pain and palpitations.  Gastrointestinal:  Negative for abdominal pain, blood in stool, constipation and diarrhea.  Endocrine: Negative for polydipsia and polyuria.  Genitourinary:  Negative for dysuria, frequency  and urgency.  Musculoskeletal:  Positive for back pain. Negative for arthralgias and myalgias.  Skin:  Negative for pallor and rash.  Allergic/Immunologic: Negative for environmental allergies.  Neurological:  Negative for dizziness, syncope and headaches.  Hematological:  Negative for adenopathy. Does not bruise/bleed easily.  Psychiatric/Behavioral:  Negative for decreased concentration and dysphoric mood. The patient is not nervous/anxious.        Objective:   Physical Exam Constitutional:      General: She is not in acute distress.    Appearance: Normal appearance. She is well-developed and normal weight. She is not ill-appearing or diaphoretic.  HENT:     Head: Normocephalic and atraumatic.   Eyes:     Conjunctiva/sclera: Conjunctivae normal.     Pupils: Pupils are equal, round, and reactive to light.  Neck:     Thyroid : No thyromegaly.     Vascular: No carotid bruit or JVD.  Cardiovascular:     Rate and Rhythm: Normal rate and regular rhythm.     Heart sounds: Normal heart sounds.     No gallop.  Pulmonary:     Effort: Pulmonary effort is normal. No respiratory distress.     Breath sounds: Normal breath sounds. No wheezing or rales.  Abdominal:     General: There is no distension or abdominal bruit.     Palpations: Abdomen is soft.  Musculoskeletal:     Cervical back: Normal range of motion and neck supple.     Right lower leg: No edema.     Left lower leg: No edema.     Comments: Significant tenderness over L3-4 vertebral area  Flexion causes discomfort   No neuro changes  Lymphadenopathy:     Cervical: No cervical adenopathy.  Skin:    General: Skin is warm and dry.     Coloration: Skin is not pale.     Findings: No rash.  Neurological:     Mental Status: She is alert.     Sensory: No sensory deficit.     Motor: No weakness.     Coordination: Coordination normal.     Gait: Gait normal.     Deep Tendon Reflexes: Reflexes are normal and symmetric. Reflexes normal.  Psychiatric:        Mood and Affect: Mood normal.           Assessment & Plan:   Problem List Items Addressed This Visit       Musculoskeletal and Integument   Osteopenia   Now with L4 comp fracture (from hard fall/not a fragility fracture )  Will treatment pain with a month of miacalcin   ? If candidate for alendronate  again - based on renal fxn Bmet pending   Discussed fall prevention, supplements and exercise for bone density   Pt is active and fit and takes ca and D      Closed compression fracture of L4 lumbar vertebra, with routine healing, subsequent encounter - Primary   From fall 10 days ago  A hard fall (not a fragility fracture)  Still fairly painful  Pt is  extremely active and fairly fit  Area of pain correlates to the compression deformity at L4  Reviewed notes from Dr Avelina and imaging Reviewed last dexa   Prescription miacalcin ns  Will need plan for osteopenia - unsure if alendronate  would be option due to renal status / bmet pending   Taking ca and D   Will call if pain worsens or fails to improve  Ref  to ortho- for further eval and to see if candidate for vertebroplasty  or other procedure       Relevant Orders   Ambulatory referral to Orthopedic Surgery     Genitourinary   CRI (chronic renal insufficiency)   Bmet ordered  Would like to start back on alendronate  but suspect GFR will still be too low   Sees nephrology      Relevant Orders   Basic metabolic panel with GFR

## 2023-07-19 NOTE — Assessment & Plan Note (Addendum)
 From fall 10 days ago  A hard fall (not a fragility fracture)  Still fairly painful  Pt is extremely active and fairly fit  Area of pain correlates to the compression deformity at L4  Reviewed notes from Dr Avelina and imaging Reviewed last dexa   Prescription miacalcin ns  Will need plan for osteopenia - unsure if alendronate  would be option due to renal status / bmet pending   Taking ca and D   Will call if pain worsens or fails to improve  Ref to ortho- for further eval and to see if candidate for vertebroplasty  or other procedure

## 2023-07-19 NOTE — Assessment & Plan Note (Signed)
 Bmet ordered  Would like to start back on alendronate  but suspect GFR will still be too low   Sees nephrology

## 2023-07-20 ENCOUNTER — Ambulatory Visit: Payer: Self-pay | Admitting: Family Medicine

## 2023-07-20 LAB — BASIC METABOLIC PANEL WITH GFR
BUN: 44 mg/dL — ABNORMAL HIGH (ref 6–23)
CO2: 25 meq/L (ref 19–32)
Calcium: 8.8 mg/dL (ref 8.4–10.5)
Chloride: 103 meq/L (ref 96–112)
Creatinine, Ser: 1.58 mg/dL — ABNORMAL HIGH (ref 0.40–1.20)
GFR: 29.94 mL/min — ABNORMAL LOW (ref >60.00)
Glucose, Bld: 117 mg/dL — ABNORMAL HIGH (ref 70–99)
Potassium: 4.3 meq/L (ref 3.5–5.1)
Sodium: 136 meq/L (ref 135–145)

## 2023-07-21 DIAGNOSIS — M545 Low back pain, unspecified: Secondary | ICD-10-CM | POA: Diagnosis not present

## 2023-07-21 DIAGNOSIS — S32040A Wedge compression fracture of fourth lumbar vertebra, initial encounter for closed fracture: Secondary | ICD-10-CM | POA: Diagnosis not present

## 2023-07-21 DIAGNOSIS — M5416 Radiculopathy, lumbar region: Secondary | ICD-10-CM | POA: Diagnosis not present

## 2023-07-22 LAB — LAB REPORT - SCANNED
EGFR: 29
HM Hepatitis Screen: NEGATIVE

## 2023-07-23 ENCOUNTER — Telehealth: Payer: Self-pay | Admitting: Family Medicine

## 2023-07-23 ENCOUNTER — Other Ambulatory Visit: Payer: Self-pay | Admitting: Family Medicine

## 2023-07-23 DIAGNOSIS — N1832 Chronic kidney disease, stage 3b: Secondary | ICD-10-CM

## 2023-07-23 DIAGNOSIS — E781 Pure hyperglyceridemia: Secondary | ICD-10-CM

## 2023-07-23 DIAGNOSIS — R7989 Other specified abnormal findings of blood chemistry: Secondary | ICD-10-CM

## 2023-07-23 DIAGNOSIS — I10 Essential (primary) hypertension: Secondary | ICD-10-CM

## 2023-07-23 NOTE — Telephone Encounter (Signed)
-----   Message from Veva JINNY Ferrari sent at 07/04/2023  3:44 PM EDT ----- Regarding: Lab orders for Tue, 7.22.25 Patient is scheduled for CPX labs, please order future labs, Thanks , Veva

## 2023-07-25 ENCOUNTER — Ambulatory Visit: Payer: Self-pay | Admitting: Family Medicine

## 2023-07-25 ENCOUNTER — Other Ambulatory Visit (INDEPENDENT_AMBULATORY_CARE_PROVIDER_SITE_OTHER)

## 2023-07-25 ENCOUNTER — Telehealth: Payer: Self-pay | Admitting: Family Medicine

## 2023-07-25 DIAGNOSIS — E781 Pure hyperglyceridemia: Secondary | ICD-10-CM

## 2023-07-25 DIAGNOSIS — I10 Essential (primary) hypertension: Secondary | ICD-10-CM

## 2023-07-25 DIAGNOSIS — N1832 Chronic kidney disease, stage 3b: Secondary | ICD-10-CM

## 2023-07-25 LAB — CBC WITH DIFFERENTIAL/PLATELET
Basophils Absolute: 0.1 K/uL (ref 0.0–0.1)
Basophils Relative: 0.6 % (ref 0.0–3.0)
Eosinophils Absolute: 0.2 K/uL (ref 0.0–0.7)
Eosinophils Relative: 1.5 % (ref 0.0–5.0)
HCT: 33.8 % — ABNORMAL LOW (ref 36.0–46.0)
Hemoglobin: 11 g/dL — ABNORMAL LOW (ref 12.0–15.0)
Lymphocytes Relative: 15.9 % (ref 12.0–46.0)
Lymphs Abs: 2 K/uL (ref 0.7–4.0)
MCHC: 32.5 g/dL (ref 30.0–36.0)
MCV: 81.4 fl (ref 78.0–100.0)
Monocytes Absolute: 1 K/uL (ref 0.1–1.0)
Monocytes Relative: 7.9 % (ref 3.0–12.0)
Neutro Abs: 9.2 K/uL — ABNORMAL HIGH (ref 1.4–7.7)
Neutrophils Relative %: 74.1 % (ref 43.0–77.0)
Platelets: 294 K/uL (ref 150.0–400.0)
RBC: 4.15 Mil/uL (ref 3.87–5.11)
RDW: 14.5 % (ref 11.5–15.5)
WBC: 12.4 K/uL — ABNORMAL HIGH (ref 4.0–10.5)

## 2023-07-25 LAB — COMPREHENSIVE METABOLIC PANEL WITH GFR
ALT: 26 U/L (ref 0–35)
AST: 24 U/L (ref 0–37)
Albumin: 3.9 g/dL (ref 3.5–5.2)
Alkaline Phosphatase: 101 U/L (ref 39–117)
BUN: 27 mg/dL — ABNORMAL HIGH (ref 6–23)
CO2: 27 meq/L (ref 19–32)
Calcium: 9.2 mg/dL (ref 8.4–10.5)
Chloride: 102 meq/L (ref 96–112)
Creatinine, Ser: 1.46 mg/dL — ABNORMAL HIGH (ref 0.40–1.20)
GFR: 32.91 mL/min — ABNORMAL LOW (ref >60.00)
Glucose, Bld: 98 mg/dL (ref 70–99)
Potassium: 4.5 meq/L (ref 3.5–5.1)
Sodium: 137 meq/L (ref 135–145)
Total Bilirubin: 0.5 mg/dL (ref 0.2–1.2)
Total Protein: 6.6 g/dL (ref 6.0–8.3)

## 2023-07-25 LAB — LIPID PANEL
Cholesterol: 183 mg/dL (ref 0–200)
HDL: 37 mg/dL — ABNORMAL LOW (ref >39.00)
LDL Cholesterol: 112 mg/dL — ABNORMAL HIGH (ref 0–99)
NonHDL: 145.51
Total CHOL/HDL Ratio: 5
Triglycerides: 166 mg/dL — ABNORMAL HIGH (ref 0.0–149.0)
VLDL: 33.2 mg/dL (ref 0.0–40.0)

## 2023-07-25 LAB — TSH: TSH: 3.9 u[IU]/mL (ref 0.35–5.50)

## 2023-07-25 LAB — VITAMIN D 25 HYDROXY (VIT D DEFICIENCY, FRACTURES): VITD: 44.99 ng/mL (ref 30.00–100.00)

## 2023-07-25 NOTE — Telephone Encounter (Signed)
 Patient dropped off notes to be reviewed by provider

## 2023-07-25 NOTE — Telephone Encounter (Signed)
 OV note from Ortho placed in PCP's inbox for review

## 2023-07-26 ENCOUNTER — Other Ambulatory Visit: Payer: Self-pay | Admitting: Family Medicine

## 2023-07-26 NOTE — Telephone Encounter (Signed)
 Reviewed and signed  For scanning

## 2023-07-27 ENCOUNTER — Telehealth: Payer: Self-pay

## 2023-07-27 DIAGNOSIS — M545 Low back pain, unspecified: Secondary | ICD-10-CM | POA: Diagnosis not present

## 2023-07-27 NOTE — Telephone Encounter (Signed)
 I spoke with the patient and scheduled her an appt for 7/30 at 11:15am for a surgical clearance.   Nothing further needed.

## 2023-07-27 NOTE — Telephone Encounter (Signed)
 Copied from CRM [id number]. Topic: Clinical - Medical Advice >> Jul 27, 2023  2:32 PM Corean SAUNDERS wrote: Reason for CRM: Patient needs surgical clearance from Dr. Tamea for her lumbar surgery but wants to know if the doctor can just sign the surgical clearance form or if the patient will need to come in for an appointment.

## 2023-07-31 ENCOUNTER — Other Ambulatory Visit: Payer: Self-pay | Admitting: Orthopedic Surgery

## 2023-08-01 ENCOUNTER — Ambulatory Visit (INDEPENDENT_AMBULATORY_CARE_PROVIDER_SITE_OTHER): Admitting: Family Medicine

## 2023-08-01 ENCOUNTER — Encounter: Payer: Self-pay | Admitting: Family Medicine

## 2023-08-01 VITALS — BP 124/70 | HR 58 | Temp 97.7°F | Ht 60.0 in | Wt 129.0 lb

## 2023-08-01 DIAGNOSIS — J441 Chronic obstructive pulmonary disease with (acute) exacerbation: Secondary | ICD-10-CM

## 2023-08-01 DIAGNOSIS — L4050 Arthropathic psoriasis, unspecified: Secondary | ICD-10-CM | POA: Diagnosis not present

## 2023-08-01 DIAGNOSIS — E781 Pure hyperglyceridemia: Secondary | ICD-10-CM | POA: Diagnosis not present

## 2023-08-01 DIAGNOSIS — S32040D Wedge compression fracture of fourth lumbar vertebra, subsequent encounter for fracture with routine healing: Secondary | ICD-10-CM

## 2023-08-01 DIAGNOSIS — I10 Essential (primary) hypertension: Secondary | ICD-10-CM

## 2023-08-01 DIAGNOSIS — N1832 Chronic kidney disease, stage 3b: Secondary | ICD-10-CM

## 2023-08-01 DIAGNOSIS — Z860100 Personal history of colon polyps, unspecified: Secondary | ICD-10-CM

## 2023-08-01 DIAGNOSIS — Z0000 Encounter for general adult medical examination without abnormal findings: Secondary | ICD-10-CM | POA: Diagnosis not present

## 2023-08-01 DIAGNOSIS — M8580 Other specified disorders of bone density and structure, unspecified site: Secondary | ICD-10-CM | POA: Diagnosis not present

## 2023-08-01 NOTE — Assessment & Plan Note (Signed)
 For follow up with nephrology  Encouraged pt to get back to better fluid intake

## 2023-08-01 NOTE — Patient Instructions (Addendum)
 When your back feels better let us  know and I will put order in for bone density  Then I will refer you to a sport med clinic to help us  with management of bone density   Get more fluids  Your kidneys really depend on it !  See the kidney doctor as planned   Take care of yourself

## 2023-08-01 NOTE — Assessment & Plan Note (Signed)
 Reviewed health habits including diet and exercise and skin cancer prevention Reviewed appropriate screening tests for age  Also reviewed health mt list, fam hx and immunization status , as well as social and family history   See HPI Labs reviewed and ordered Health Maintenance  Topic Date Due   COVID-19 Vaccine (5 - 2024-25 season) 09/04/2022   Medicare Annual Wellness Visit  09/13/2023   Colon Cancer Screening  09/21/2023   Flu Shot  08/04/2023   Mammogram  10/13/2023   DTaP/Tdap/Td vaccine (4 - Tdap) 12/09/2028   Pneumococcal Vaccine for age over 42  Completed   DEXA scan (bone density measurement)  Completed   Zoster (Shingles) Vaccine  Completed   Hepatitis B Vaccine  Aged Out   HPV Vaccine  Aged Out   Meningitis B Vaccine  Aged Out   Dexa due when physically able  Discussed fall prevention, supplements and exercise for bone density   Derm care utd  PHQ 0

## 2023-08-01 NOTE — Assessment & Plan Note (Signed)
 bp in fair control at this time  BP Readings from Last 1 Encounters:  08/01/23 124/70   No changes needed Most recent labs reviewed  Disc lifstyle change with low sodium diet and exercise  Continues amlodipine  10 mg daily  Losartan  50 mg daily

## 2023-08-01 NOTE — Assessment & Plan Note (Signed)
 Continues cosentyx and rheum care Stable

## 2023-08-01 NOTE — Assessment & Plan Note (Signed)
 Continues pulmonary follow up  Trelegy ellipta  once daily  Stable

## 2023-08-01 NOTE — Assessment & Plan Note (Signed)
 Disc goals for lipids and reasons to control them Rev last labs with pt Rev low sat fat diet in detail Improved this draw at 166

## 2023-08-01 NOTE — Progress Notes (Signed)
 Subjective:    Patient ID: Pamela Morales, female    DOB: [birth year], 83 y.o.   MRN: [id number]  HPI  Here for health maintenance exam and to review chronic medical problems   Wt Readings from Last 3 Encounters:  08/01/23 129 lb (58.5 kg)  07/19/23 131 lb 4 oz (59.5 kg)  07/12/23 132 lb (59.9 kg)   25.19 kg/m  Vitals:   08/01/23 1111  BP: 124/70  Pulse: (!) 58  Temp: 97.7 F (36.5 C)  SpO2: 97%    Immunization History  Administered Date(s) Administered   Fluad Quad(high Dose 65+) 01/01/2020   Influenza Split 10/11/2011   Influenza Whole 10/04/2006   Influenza, High Dose Seasonal PF 10/12/2016, 10/30/2017, 10/04/2018, 01/01/2020, 09/28/2021   Influenza,inj,Quad PF,6+ Mos 09/19/2012, 10/31/2013, 10/10/2014   Influenza-Unspecified 09/18/2015   Moderna Sars-Covid-2 Vaccination 01/29/2019, 02/26/2019, 11/23/2019   Pfizer(Comirnaty)Fall Seasonal Vaccine 12 years and older 10/24/2021   Pneumococcal Conjugate-13 04/28/2014   Pneumococcal Polysaccharide-23 08/03/2012   Respiratory Syncytial Virus Vaccine,Recomb Aduvanted(Arexvy) 01/19/2022   Td 01/03/1994, 07/17/2008, 12/10/2018   Zoster Recombinant(Shingrix) 03/25/2021, 08/23/2021    Health Maintenance Due  Topic Date Due   COVID-19 Vaccine (5 - 2024-25 season) 09/04/2022   Medicare Annual Wellness (AWV)  09/13/2023   Colonoscopy  09/21/2023     Mammogram 10/2022  Self breast exam- no lumps   Gyn health No problems    Colon cancer screening -colonoscopy 09/2018 with polyp/ no recall due to age   Bone health  Dexa  05/2021  osteopenia   solis  Falls Fractures-spinal compression fracture  Kyphoplasty is planned next mo  Supplements - mvi and D and ca  Last vitamin D  Lab Results  Component Value Date   VD25OH 44.99 07/25/2023    Exercise  Goes to the gym (before back injury)  Cardio and machines   Dermatology care - up to date / every 6 months  Has had skin cancers Is good about sun protection    Mood    08/01/2023   11:17 AM 07/19/2023    2:03 PM 07/12/2023   11:32 AM 09/13/2022    2:50 PM 07/29/2022    2:07 PM  Depression screen PHQ 2/9  Decreased Interest 0 0 0 0 0  Down, Depressed, Hopeless 0 0 0 0 0  PHQ - 2 Score 0 0 0 0 0  Altered sleeping 0 0 0 0 0  Tired, decreased energy 0 0 0 0 0  Change in appetite 0 0 0 0 0  Feeling bad or failure about yourself  0 0 0 0 0  Trouble concentrating 0 0 0 0 0  Moving slowly or fidgety/restless 0 0 0 0 0  Suicidal thoughts 0 0 0 0 0  PHQ-9 Score 0 0 0 0 0  Difficult doing work/chores Not difficult at all Not difficult at all Not difficult at all Not difficult at all Not difficult at all   Fluoxetine  60 mg daily  Does great with that   HTN bp is stable today  No cp or palpitations or headaches or edema  No side effects to medicines  BP Readings from Last 3 Encounters:  08/01/23 124/70  07/19/23 128/68  07/12/23 124/64     CKD  Lab Results  Component Value Date   NA 137 07/25/2023   K 4.5 07/25/2023   CO2 27 07/25/2023   GLUCOSE 98 07/25/2023   BUN 27 (H) 07/25/2023   CREATININE 1.46 (H) 07/25/2023   CALCIUM 9.2 07/25/2023  GFR 32.91 (L) 07/25/2023   EGFR 29.0 07/18/2023   GFRNONAA 41 (L) 08/08/2022   Losartan  50 mg daily  Amlodipine  10 mg daily   Nephrology follow up is in sept   Needs to drink more water   Anemia of chronic dz  Lab Results  Component Value Date   WBC 12.4 (H) 07/25/2023   HGB 11.0 (L) 07/25/2023   HCT 33.8 (L) 07/25/2023   MCV 81.4 07/25/2023   PLT 294.0 07/25/2023     History of squamous cell lung cancer  Also copd Has follow up tomorrow  Per pt stable /doing well    Psoriatic arthritis  No big changes  Cosentyx    History of high triglycerides Lab Results  Component Value Date   CHOL 183 07/25/2023   CHOL 197 07/16/2019   CHOL 215 (H) 10/12/2016   Lab Results  Component Value Date   HDL 37.00 (L) 07/25/2023   HDL 34.90 (L) 07/16/2019   HDL 40.40 10/12/2016    Lab Results  Component Value Date   LDLCALC 112 (H) 07/25/2023   LDLCALC 112 (H) 07/17/2008   LDLCALC 97 02/05/2007   Lab Results  Component Value Date   TRIG 166.0 (H) 07/25/2023   TRIG 334.0 (H) 07/16/2019   TRIG 251.0 (H) 10/12/2016   Lab Results  Component Value Date   CHOLHDL 5 07/25/2023   CHOLHDL 6 07/16/2019   CHOLHDL 5 10/12/2016   Lab Results  Component Value Date   LDLDIRECT 111.0 07/16/2019   LDLDIRECT 124.0 10/12/2016   LDLDIRECT 121.0 03/23/2016   Eaitng less since back injury  Overall avoids fatty foods     Lab Results  Component Value Date   TSH 3.90 07/25/2023    Lab Results  Component Value Date   ALT 26 07/25/2023   AST 24 07/25/2023   ALKPHOS 101 07/25/2023   BILITOT 0.5 07/25/2023    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 04/06/2022   Pain of left scapula 04/06/2022   Primary localized osteoarthrosis of multiple sites 08/11/2021   Other long term (current) drug therapy 08/11/2021   Genetic testing 07/07/2021   Squamous cell carcinoma of left lung (HCC) 06/18/2021   Personal history of ovarian cancer 06/15/2021   Family history of melanoma 06/15/2021   Family history of stomach cancer 06/15/2021   Mass of left lung 05/14/2021   CRI (chronic renal insufficiency) 10/23/2020   Left foot pain 01/27/2020   High triglycerides 07/16/2019   COPD (chronic obstructive pulmonary disease) (HCC) 07/12/2018   Elevated TSH 10/18/2016   Elevated serum creatinine 10/18/2016   Estrogen deficiency 03/23/2016   Psoriatic arthritis (HCC) 03/31/2015   Routine general medical examination at a health care facility 03/25/2015   Fatigue 04/28/2014   Family history of  colon cancer 01/01/2013   History of colonic polyps 01/01/2013   Anemia 12/19/2012   Hearing loss 09/19/2012   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   BACK PAIN WITH RADICULOPATHY 03/15/2007   History of herpes zoster 02/23/2007   Essential hypertension 02/05/2007   Osteopenia 02/05/2007   ECZEMA, ATOPIC 01/29/2007   ROSACEA 01/29/2007   BASAL CELL CARCINOMA, HX OF 01/29/2007   Past Medical History:  Diagnosis Date  Allergy    Anemia    Anginal pain (HCC)    Anxiety    Aortic atherosclerosis (HCC)    Breast cancer (HCC) 2006?   C1 cervical fracture (HCC)    placed in c-collar-to f/u with neurosurgery on 06-21-21 in [facility]   CA - cancer of ovary    Cancer (HCC) 02/10   R breast  (est rec neg and brac neg)   Cataract    FORMING    Complication of anesthesia    deviated septum-lost memory for about 1 week after anesthesia   COPD (chronic obstructive pulmonary disease) (HCC)    DDD (degenerative disc disease)    in back with chronic pain   Depression    Diverticulosis    DOE (dyspnea on exertion)    Eczema    Elevated serum creatinine    sees nephrology yearly   Elevated TSH    Emphysema of lung (HCC)    Family history of colon cancer    GERD (gastroesophageal reflux disease)    Hearing loss    Hypertension    Hypertriglyceridemia    Mass of left lung    Migraines    Osteopenia    Psoriatic arthritis (HCC)    Rosacea    Skin cancer 2000?   Squamous cell carcinoma of left lung (HCC)    Thyroid  nodule    Ventral hernia    Zoster 02/2007   Past Surgical History:  Procedure Laterality Date   ABDOMINAL HYSTERECTOMY  1996   BSO fibroids, incidental ovarian CA finding   BASAL CELL CARCINOMA EXCISION  09/2016   bladder tack     BREAST SURGERY     rt total mastectomy for breast CA   CHOLECYSTECTOMY  08/1996   10 yrs ago   COLONOSCOPY     EYE SURGERY     lipoma removal  11/2005   NASAL SEPTUM SURGERY     POLYPECTOMY     VIDEO BRONCHOSCOPY WITH  ENDOBRONCHIAL ULTRASOUND Left 06/16/2021   Procedure: POSSIBLE VIDEO BRONCHOSCOPY WITH ENDOBRONCHIAL ULTRASOUND;  Surgeon: Tamea Dedra CROME, MD;  Location: [facility];  Service: Pulmonary;  Laterality: Left;   XI ROBOTIC ASSISTED VENTRAL HERNIA N/A 08/16/2022   Procedure: XI ROBOTIC ASSISTED VENTRAL HERNIA;  Surgeon: Desiderio Schanz, MD;  Location: [facility];  Service: General;  Laterality: N/A;   Social History   Tobacco Use   Smoking status: Former    Current packs/day: 0.00    Average packs/day: 2.0 packs/day for 10.0 years (20.0 ttl pk-yrs)    Types: Cigarettes    Start date: 01/04/1980    Quit date: 01/03/1990    Years since quitting: 33.5    Passive exposure: Past   Smokeless tobacco: Never  Vaping Use   Vaping status: Never Used  Substance Use Topics   Alcohol use: Not Currently    Alcohol/week: 5.0 standard drinks of alcohol    Types: 5 Glasses of wine per week   Drug use: No   Family History  Problem Relation Age of Onset   Hypertension Mother    Heart disease Mother        CAD   Stomach cancer Mother 22   Cancer Mother    Lung cancer Father 73   Cancer Father    Early death Father    Cancer Sister        colon, skin   Heart disease Sister 74       MI   Diabetes Sister    Colon  cancer Sister    Head & neck cancer Sister 63   Lung cancer Sister    Melanoma Sister        multiple on head   Heart disease Brother        HTN and MI   Hypertension Brother    Kidney disease Brother    Cancer Maternal Aunt        unk type   Cancer Paternal Aunt        unk type   Cancer Paternal Uncle        unk type   Colon cancer Daughter 57   Cancer Son    Cancer Sister    Hypertension Sister    Heart disease Daughter    Esophageal cancer Neg Hx    Colon polyps Neg Hx    Allergies  Allergen Reactions   Sulfa Antibiotics     Pt unsure of reaction   Current Outpatient Medications on File Prior to Visit  Medication Sig Dispense Refill   acetaminophen  (TYLENOL ) 500 MG  tablet Take 2 tablets (1,000 mg total) by mouth every 6 (six) hours as needed for mild pain.     amLODipine  (NORVASC ) 10 MG tablet TAKE 1 TABLET BY MOUTH EVERY DAY 90 tablet 0   Calcium Carbonate-Vitamin D  (CALTRATE 600+D PO) Take 1,200 mg by mouth every evening.      cyclobenzaprine  (FLEXERIL ) 10 MG tablet Take 0.5-1 tablets (5-10 mg total) by mouth at bedtime as needed. 15 tablet 0   famotidine  (PEPCID ) 20 MG tablet TAKE 1 TABLET BY MOUTH TWICE A DAY 180 tablet 1   FLUoxetine  (PROZAC ) 20 MG capsule TAKE 3 CAPSULES BY MOUTH EVERY DAY 270 capsule 0   losartan  (COZAAR ) 50 MG tablet TAKE 1 TABLET BY MOUTH EVERY DAY 90 tablet 0   MAGNESIUM PO Take 500 mg by mouth daily.     MELATONIN PO Take 10 mg by mouth at bedtime.     methocarbamol (ROBAXIN) 500 MG tablet Take 500-1,000 mg by mouth every 6 (six) hours as needed.     Multiple Vitamin (MULTIVITAMIN) tablet Take 1 tablet by mouth daily.     Multiple Vitamins-Minerals (PRESERVISION AREDS 2 PO) Take 1 capsule by mouth 2 (two) times daily.     OVER THE COUNTER MEDICATION Take 1 tablet by mouth daily. Jet Asleep     Secukinumab, 300 MG Dose, (COSENTYX, 300 MG DOSE,) 150 MG/ML SOSY Inject 1 application  as directed every 30 (thirty) days.     traMADol (ULTRAM) 50 MG tablet Take 50-100 mg by mouth 3 (three) times daily as needed.     TRELEGY ELLIPTA  100-62.5-25 MCG/ACT AEPB INHALE 1 PUFF INTO THE LUNGS DAILY (Patient taking differently: Inhale 1 puff into the lungs every morning.) 60 each 11   No current facility-administered medications on file prior to visit.    Review of Systems  Constitutional:  Negative for activity change, appetite change, fatigue, fever and unexpected weight change.  HENT:  Negative for congestion, ear pain, rhinorrhea, sinus pressure and sore throat.   Eyes:  Negative for pain, redness and visual disturbance.  Respiratory:  Negative for cough, shortness of breath and wheezing.   Cardiovascular:  Negative for chest pain and  palpitations.  Gastrointestinal:  Negative for abdominal pain, blood in stool, constipation and diarrhea.  Endocrine: Negative for polydipsia and polyuria.  Genitourinary:  Negative for dysuria, frequency and urgency.  Musculoskeletal:  Positive for arthralgias and back pain. Negative for myalgias.  Skin:  Negative  for pallor and rash.  Allergic/Immunologic: Negative for environmental allergies.  Neurological:  Negative for dizziness, syncope and headaches.  Hematological:  Negative for adenopathy. Does not bruise/bleed easily.  Psychiatric/Behavioral:  Negative for decreased concentration and dysphoric mood. The patient is not nervous/anxious.        Objective:   Physical Exam Constitutional:      General: She is not in acute distress.    Appearance: Normal appearance. She is well-developed and normal weight. She is not ill-appearing or diaphoretic.  HENT:     Head: Normocephalic and atraumatic.     Right Ear: Tympanic membrane, ear canal and external ear normal.     Left Ear: Tympanic membrane, ear canal and external ear normal.     Nose: Nose normal. No congestion.     Mouth/Throat:     Mouth: Mucous membranes are moist.     Pharynx: Oropharynx is clear. No posterior oropharyngeal erythema.  Eyes:     General: No scleral icterus.    Extraocular Movements: Extraocular movements intact.     Conjunctiva/sclera: Conjunctivae normal.     Pupils: Pupils are equal, round, and reactive to light.  Neck:     Thyroid : No thyromegaly.     Vascular: No carotid bruit or JVD.  Cardiovascular:     Rate and Rhythm: Normal rate and regular rhythm.     Pulses: Normal pulses.     Heart sounds: Normal heart sounds.     No gallop.  Pulmonary:     Effort: Pulmonary effort is normal. No respiratory distress.     Breath sounds: Normal breath sounds. No wheezing.     Comments: Good air exch Chest:     Chest wall: No tenderness.  Abdominal:     General: Bowel sounds are normal. There is no  distension or abdominal bruit.     Palpations: Abdomen is soft. There is no mass.     Tenderness: There is no abdominal tenderness.     Hernia: No hernia is present.  Genitourinary:    Comments: Breast exam: No mass, nodules, thickening, tenderness, bulging, retraction, inflamation, nipple discharge or skin changes noted.  No axillary or clavicular LA.   Baseline surgical changes noted    Musculoskeletal:        General: No tenderness. Normal range of motion.     Cervical back: Normal range of motion and neck supple. No rigidity. No muscular tenderness.     Right lower leg: No edema.     Left lower leg: No edema.     Comments: Limited rom of TS and LS  Lymphadenopathy:     Cervical: No cervical adenopathy.  Skin:    General: Skin is warm and dry.     Coloration: Skin is not pale.     Findings: No erythema or rash.  Neurological:     Mental Status: She is alert. Mental status is at baseline.     Cranial Nerves: No cranial nerve deficit.     Motor: No abnormal muscle tone.     Coordination: Coordination normal.     Gait: Gait normal.     Deep Tendon Reflexes: Reflexes are normal and symmetric.  Psychiatric:        Mood and Affect: Mood normal.        Cognition and Memory: Cognition and memory normal.           Assessment & Plan:   Problem List Items Addressed This Visit       Cardiovascular  and Mediastinum   Essential hypertension   bp in fair control at this time  BP Readings from Last 1 Encounters:  08/01/23 124/70   No changes needed Most recent labs reviewed  Disc lifstyle change with low sodium diet and exercise  Continues amlodipine  10 mg daily  Losartan  50 mg daily          Respiratory   COPD (chronic obstructive pulmonary disease) (HCC)   Continues pulmonary follow up  Trelegy ellipta  once daily  Stable         Musculoskeletal and Integument   Psoriatic arthritis (HCC)   Continues cosentyx and rheum care Stable       Relevant Medications    methocarbamol (ROBAXIN) 500 MG tablet   traMADol (ULTRAM) 50 MG tablet   Osteopenia   With recent spinal comp fracture (planning kyphoplasty)  Will plan on dexa when physically able   Discussed fall prevention, supplements and exercise for bone density  Treatment options are less in light of ckd  Will plan to ref to sport med for help with this once back is improved         Closed compression fracture of L4 lumbar vertebra, with routine healing, subsequent encounter   Planning kypoplasty next mo with Dr Beuford  Tramadol for pain          Genitourinary   CRI (chronic renal insufficiency)   For follow up with nephrology  Encouraged pt to get back to better fluid intake         Other   Routine general medical examination at a health care facility - Primary   Reviewed health habits including diet and exercise and skin cancer prevention Reviewed appropriate screening tests for age  Also reviewed health mt list, fam hx and immunization status , as well as social and family history   See HPI Labs reviewed and ordered Health Maintenance  Topic Date Due   COVID-19 Vaccine (5 - 2024-25 season) 09/04/2022   Medicare Annual Wellness Visit  09/13/2023   Colon Cancer Screening  09/21/2023   Flu Shot  08/04/2023   Mammogram  10/13/2023   DTaP/Tdap/Td vaccine (4 - Tdap) 12/09/2028   Pneumococcal Vaccine for age over 4  Completed   DEXA scan (bone density measurement)  Completed   Zoster (Shingles) Vaccine  Completed   Hepatitis B Vaccine  Aged Out   HPV Vaccine  Aged Out   Meningitis B Vaccine  Aged Out   Dexa due when physically able  Discussed fall prevention, supplements and exercise for bone density   Derm care utd  PHQ 0        History of colonic polyps   Polyp 2020 adenoma Repeat colonoscopy not recommended due to age       High triglycerides   Disc goals for lipids and reasons to control them Rev last labs with pt Rev low sat fat diet in detail Improved  this draw at 166

## 2023-08-01 NOTE — Assessment & Plan Note (Signed)
 With recent spinal comp fracture (planning kyphoplasty)  Will plan on dexa when physically able   Discussed fall prevention, supplements and exercise for bone density  Treatment options are less in light of ckd  Will plan to ref to sport med for help with this once back is improved

## 2023-08-01 NOTE — Assessment & Plan Note (Signed)
 Polyp 2020 adenoma Repeat colonoscopy not recommended due to age

## 2023-08-01 NOTE — Assessment & Plan Note (Signed)
 Planning kypoplasty next mo with Dr Beuford  Tramadol for pain

## 2023-08-02 ENCOUNTER — Encounter: Payer: Self-pay | Admitting: Pulmonary Disease

## 2023-08-02 ENCOUNTER — Ambulatory Visit: Admitting: Pulmonary Disease

## 2023-08-02 VITALS — BP 124/82 | HR 64 | Temp 97.1°F | Ht 60.0 in | Wt 127.2 lb

## 2023-08-02 DIAGNOSIS — Z01811 Encounter for preprocedural respiratory examination: Secondary | ICD-10-CM | POA: Diagnosis not present

## 2023-08-02 DIAGNOSIS — R911 Solitary pulmonary nodule: Secondary | ICD-10-CM | POA: Diagnosis not present

## 2023-08-02 DIAGNOSIS — J449 Chronic obstructive pulmonary disease, unspecified: Secondary | ICD-10-CM | POA: Diagnosis not present

## 2023-08-02 DIAGNOSIS — C3492 Malignant neoplasm of unspecified part of left bronchus or lung: Secondary | ICD-10-CM | POA: Diagnosis not present

## 2023-08-02 MED ORDER — ALBUTEROL SULFATE HFA 108 (90 BASE) MCG/ACT IN AERS: 2.0000 | INHALATION_SPRAY | Freq: Four times a day (QID) | RESPIRATORY_TRACT | 2 refills | Status: AC | PRN

## 2023-08-02 NOTE — Patient Instructions (Signed)
 VISIT SUMMARY:  You came in today for a presurgical assessment for your L4 compression fracture. We discussed your current pain management, breathing issues, and upcoming procedures. Your pain medication is causing some side effects, and we have a plan to address these issues. We also reviewed your COPD management and your upcoming follow-up for cancer surveillance.  YOUR PLAN:  -LUMBAR VERTEBRAL COMPRESSION FRACTURE (L4) WITH CHRONIC PAIN: You have a fracture in one of your lower back bones, which is causing ongoing pain and affecting your sleep. You have a scheduled procedure called vertebroplasty for August 13th to help stabilize the bone and reduce pain. In the meantime, try to stay hydrated to prevent constipation, which can be a side effect of your pain medication.  -CHRONIC OBSTRUCTIVE PULMONARY DISEASE (COPD): Your COPD, a lung condition that makes it hard to breathe, is currently well-managed with your medication, Trelegy. We will also prescribe an emergency inhaler for you to use if needed.  -MALIGNANCY UNDER SURVEILLANCE: We are keeping an eye on your cancer, and you have a follow-up scan scheduled for December. We will review the results together after the scan.  INSTRUCTIONS:  Proceed with your vertebroplasty on August 13th. Stay hydrated to prevent constipation. Use your emergency inhaler if you experience any breathing difficulties. Schedule a follow-up appointment after your December scan to review the results.

## 2023-08-02 NOTE — Progress Notes (Signed)
 Subjective:    Patient ID: Pamela Morales, female    DOB: September 29, 1940, 83 y.o.   MRN: [id number]  Patient Care Team: Randeen, Laine LABOR, MD as PCP - General (Family Medicine) Robinson Pao, MD as Consulting Physician (Dermatology) Paul Barrio, OD as Consulting Physician (Optometry) Gail Favorite, MD as Consulting Physician (General Surgery) Verdene Gills, RN as Oncology Nurse Navigator Jacobo, Evalene PARAS, MD as Consulting Physician (Oncology) Fate Morna SAILOR, [facility] (Inactive) as Pharmacist (Pharmacist) Tamea Dedra CROME, MD as Consulting Physician (Pulmonary Disease) Lenn Aran, MD as Consulting Physician (Radiation Oncology) Tamea Dedra CROME, MD as Consulting Physician (Pulmonary Disease) Beuford Anes, MD as Consulting Physician (Orthopedic Surgery)  Chief Complaint  Patient presents with   Follow-up    DOE. Some wheezing. No cough.     BACKGROUND/INTERVAL: Patient is an 83 year old former smoker with a history of stage II COPD and prior squamous cell carcinoma of the lung who presents for preoperative evaluation for management of a compression fracture at L4.  HPI Discussed the use of AI scribe software for clinical note transcription with the patient, who gave verbal consent to proceed.  History of Present Illness   Pamela Morales is an 83 year old female who presents for a presurgical assessment for an L4 compression fracture.  She is scheduled for a procedure involving cement augmentation for an L4 compression fracture. Her current pain is not well controlled, and her pain medication causes dry mouth, reduces her appetite, and disrupts her sleep. She wakes up at 2 AM with leg pain that is only relieved by Tylenol , which she takes at least three times a night.  She has a history of breathing issues, namely COPD stage II, currently managed with Trelegy. Her breathing is stable with this medication, and she has not experienced recent exacerbations or required an use  of rescue inhaler. No cough or sputum production is reported.  She is able to do her activities of daily living without shortness of breath.  Her current limitation is due to back from her L4 compression fracture.  She is awaiting a follow-up scan related to her cancer in December. She prefers having procedures done in [place] due to familiarity and comfort with the staff, although her L4 procedure is scheduled at [facility] due to the availability of the orthopedic surgeon.  No recent exacerbations of breathing issues, cough, or sputum production.      DATA 11/08/2016 PFTs: FEV1 1.5 8 L or 89% predicted, FVC 2.26 L or 99% predicted, FEV1/FVC 70%, TLC 111%, RV 127% diffusion capacity was normal.  Consistent with moderate obstructive airways disease with air trapping and hyperinflation. 06/16/2021 left upper lobe biopsy: Robotic assisted biopsy consistent with squamous cell carcinoma.  Review of Systems A 10 point review of systems was performed and it is as noted above otherwise negative.   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 04/06/2022   Pain of left scapula 04/06/2022   Primary localized osteoarthrosis of multiple sites 08/11/2021   Other long term (current) drug therapy 08/11/2021   Genetic testing 07/07/2021   Squamous cell carcinoma of left lung (HCC) 06/18/2021   Personal history of ovarian cancer 06/15/2021   Family history of melanoma 06/15/2021   Family history  of stomach cancer 06/15/2021   Mass of left lung 05/14/2021   CRI (chronic renal insufficiency) 10/23/2020   Left foot pain 01/27/2020   High triglycerides 07/16/2019   COPD (chronic obstructive pulmonary disease) (HCC) 07/12/2018    Elevated TSH 10/18/2016   Elevated serum creatinine 10/18/2016   Estrogen deficiency 03/23/2016   Psoriatic arthritis (HCC) 03/31/2015   Routine general medical examination at a health care facility 03/25/2015   Fatigue 04/28/2014   Family history of colon cancer 01/01/2013   History of colonic polyps 01/01/2013   Anemia 12/19/2012   Hearing loss 09/19/2012   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   BACK PAIN WITH RADICULOPATHY 03/15/2007   History of herpes zoster 02/23/2007   Essential hypertension 02/05/2007   Osteopenia 02/05/2007   ECZEMA, ATOPIC 01/29/2007   ROSACEA 01/29/2007   BASAL CELL CARCINOMA, HX OF 01/29/2007    Social History   Tobacco Use   Smoking status: Former    Current packs/day: 0.00    Average packs/day: 2.0 packs/day for 10.0 years (20.0 ttl pk-yrs)    Types: Cigarettes    Start date: 01/04/1980    Quit date: 01/03/1990    Years since quitting: 33.6    Passive exposure: Past   Smokeless tobacco: Never  Substance Use Topics   Alcohol use: Not Currently    Alcohol/week: 5.0 standard drinks of alcohol    Types: 5 Glasses of wine per week    Allergies  Allergen Reactions   Sulfa Antibiotics     Pt unsure of reaction    Current Meds  Medication Sig   acetaminophen  (TYLENOL ) 500 MG tablet Take 2 tablets (1,000 mg total) by mouth every 6 (six) hours as needed for mild pain.   albuterol  (VENTOLIN  HFA) 108 (90 Base) MCG/ACT inhaler Inhale 2 puffs into the lungs every 6 (six) hours as needed.   amLODipine  (NORVASC ) 10 MG tablet TAKE 1 TABLET BY MOUTH EVERY DAY   Calcium Carbonate-Vitamin D  (CALTRATE 600+D PO) Take 1,200 mg by mouth every evening.    cyclobenzaprine  (FLEXERIL ) 10 MG tablet Take 0.5-1 tablets (5-10 mg total) by mouth at bedtime as needed.   famotidine  (PEPCID ) 20 MG tablet TAKE 1 TABLET BY MOUTH TWICE A DAY   FLUoxetine  (PROZAC ) 20 MG capsule TAKE 3 CAPSULES BY MOUTH EVERY DAY   losartan  (COZAAR ) 50 MG tablet TAKE 1 TABLET BY  MOUTH EVERY DAY   MAGNESIUM PO Take 500 mg by mouth daily.   MELATONIN PO Take 10 mg by mouth at bedtime.   methocarbamol (ROBAXIN) 500 MG tablet Take 500-1,000 mg by mouth every 6 (six) hours as needed.   Multiple Vitamin (MULTIVITAMIN) tablet Take 1 tablet by mouth daily.   Multiple Vitamins-Minerals (PRESERVISION AREDS 2 PO) Take 1 capsule by mouth 2 (two) times daily.   OVER THE COUNTER MEDICATION Take 1 tablet by mouth daily. Jet Asleep   Secukinumab, 300 MG Dose, (COSENTYX, 300 MG DOSE,) 150 MG/ML SOSY Inject 1 application  as directed every 30 (thirty) days.   traMADol (ULTRAM) 50 MG tablet Take 50-100 mg by mouth 3 (three) times daily as needed.   TRELEGY ELLIPTA  100-62.5-25 MCG/ACT AEPB INHALE 1 PUFF INTO THE LUNGS DAILY (Patient taking differently: Inhale 1 puff into the lungs every morning.)    Immunization History  Administered Date(s) Administered   Fluad Quad(high Dose 65+) 01/01/2020   Influenza Split 10/11/2011   Influenza Whole 10/04/2006   Influenza, High Dose Seasonal PF 10/12/2016, 10/30/2017, 10/04/2018, 01/01/2020, 09/28/2021  Influenza,inj,Quad PF,6+ Mos 09/19/2012, 10/31/2013, 10/10/2014   Influenza-Unspecified 09/18/2015   Moderna Sars-Covid-2 Vaccination 01/29/2019, 02/26/2019, 11/23/2019   Pfizer(Comirnaty)Fall Seasonal Vaccine 12 years and older 10/24/2021   Pneumococcal Conjugate-13 04/28/2014   Pneumococcal Polysaccharide-23 08/03/2012   Respiratory Syncytial Virus Vaccine,Recomb Aduvanted(Arexvy) 01/19/2022   Td 01/03/1994, 07/17/2008, 12/10/2018   Zoster Recombinant(Shingrix) 03/25/2021, 08/23/2021        Objective:     BP 124/82 (BP Location: Right Arm, Cuff Size: Normal)   Pulse 64   Temp (!) 97.1 F (36.2 C)   Ht 5' (1.524 m)   Wt 127 lb 3.2 oz (57.7 kg)   SpO2 95%   BMI 24.84 kg/m   SpO2: 95 % O2 Device: None (Room air)  GENERAL: Well-developed well-nourished elderly woman, no acute distress.  Fully ambulatory, no conversational  dyspnea.  Appears uncomfortable due to back pain.  HEAD: Normocephalic, atraumatic.  EYES: Pupils equal, round, reactive to light.  No scleral icterus.  MOUTH: Oral mucosa moist.  No thrush. NECK: Supple. No thyromegaly. Trachea midline. No JVD.  No adenopathy. PULMONARY: Good air entry bilaterally.  Coarse, otherwise no adventitious sounds. CARDIOVASCULAR: S1 and S2. Regular rate and rhythm.  No rubs, murmurs or gallops heard. ABDOMEN: Benign, ventral hernia repaired. MUSCULOSKELETAL: No joint deformity, no clubbing, no edema.  NEUROLOGIC: No overt focal deficit, no gait disturbance, speech is fluent. SKIN: Intact,warm,dry.  No rashes noted. PSYCH: Mood and behavior normal.    ARISCAT score: 24.  Patient is low risk, 1.6% risk of in-hospital postoperative pulmonary complications.  Independently reviewed CT chest of 05 June 2023 there are postradiation changes on the left upper lobe.Representative image showing possibly enlarging right lower lobe nodule at 9 mm (arrow):     Assessment & Plan:     ICD-10-CM   1. Stage 2 moderate COPD by GOLD classification (HCC)  J44.9     2. Squamous cell carcinoma of left lung (HCC)  C34.92     3. Lung nodule RLL, 9 mm  R91.1     4. Preoperative respiratory examination  Z01.811    Low risk for proposed vertebroplasty     Meds ordered this encounter  Medications   albuterol  (VENTOLIN  HFA) 108 (90 Base) MCG/ACT inhaler    Sig: Inhale 2 puffs into the lungs every 6 (six) hours as needed.    Dispense:  8 g    Refill:  2   Discussion:    Lumbar vertebral compression fracture (L4) with pain L4 vertebral compression fracture causing pain, affecting sleep. Current medication causes dry mouth and poor sleep, with persistent leg pain. Vertebroplasty with cement augmentation is scheduled for August 13th to prevent further complications, including potential impact on breathing.  Patient low risk for the proposed procedure. - Proceed with  vertebroplasty on August 13th - Encourage hydration to prevent constipation  Chronic obstructive pulmonary disease (COPD) COPD is well-managed with Trelegy. No recent exacerbations or significant cough. No rescue inhaler currently carried. - Prescribe rescue inhaler (albuterol ) for use with Trelegy if needed  Malignancy under surveillance Malignancy under surveillance with follow-up scan scheduled for December. - Schedule follow-up appointment after December scan to review results      Advised if symptoms do not improve or worsen, to please contact office for sooner follow up or seek emergency care.    I spent 40 minutes of dedicated to the care of this patient on the date of this encounter to include pre-visit review of records, face-to-face time with the patient discussing conditions above,  post visit ordering of testing, clinical documentation with the electronic health record, making appropriate referrals as documented, and communicating necessary findings to members of the patients care team.    C. Leita Sanders, MD Advanced Bronchoscopy PCCM [facility]    *This note was generated using voice recognition software/Dragon and/or AI transcription program.  Despite best efforts to proofread, errors can occur which can change the meaning. Any transcriptional errors that result from this process are unintentional and may not be fully corrected at the time of dictation.

## 2023-08-08 NOTE — Pre-Procedure Instructions (Signed)
 Surgical Instructions   Your procedure is scheduled on August 16, 2023. Report to [facility] Main Entrance A at 12:40 P.M., then check in with the Admitting office. Any questions or running late day of surgery: call [phone]  Questions prior to your surgery date: call [phone], Monday-Friday, 8am-4pm. If you experience any cold or flu symptoms such as cough, fever, chills, shortness of breath, etc. between now and your scheduled surgery, please notify us  at the above number.     Remember:  Do not eat after midnight the night before your surgery  You may drink clear liquids until 12:40 PM the afternoon of your surgery.   Clear liquids allowed are: Water, Non-Citrus Juices (without pulp), Carbonated Beverages, Clear Tea (no milk, honey, etc.), Black Coffee Only (NO MILK, CREAM OR POWDERED CREAMER of any kind), and Gatorade.  Patient Instructions  The night before surgery:  No food after midnight. ONLY clear liquids after midnight  The day of surgery (if you do NOT have diabetes):  Drink ONE (1) Pre-Surgery Clear Ensure by 12:40 PM the afternoon of surgery. Drink in one sitting. Do not sip.  This drink was given to you during your hospital  pre-op appointment visit.  Nothing else to drink after completing the  Pre-Surgery Clear Ensure.    Take these medicines the morning of surgery with A SIP OF WATER: famotidine  (PEPCID )  FLUoxetine  (PROZAC )  TRELEGY ELLIPTA  inhaler   May take these medicines IF NEEDED: acetaminophen  (TYLENOL )  albuterol  (VENTOLIN  HFA) inhaler - please bring inhaler with you morning of surgery methocarbamol (ROBAXIN)  traMADol (ULTRAM)    One week prior to surgery, STOP taking any Aspirin (unless otherwise instructed by your surgeon) Aleve, Naproxen, Ibuprofen , Motrin , Advil , Goody's, BC's, all herbal medications, fish oil, and non-prescription vitamins.                     Do NOT Smoke (Tobacco/Vaping) for 24 hours prior to your  procedure.  If you use a CPAP at night, you may bring your mask/headgear for your overnight stay.   You will be asked to remove any contacts, glasses, piercing's, hearing aid's, dentures/partials prior to surgery. Please bring cases for these items if needed.    Patients discharged the day of surgery will not be allowed to drive home, and someone needs to stay with them for 24 hours.  SURGICAL WAITING ROOM VISITATION Patients may have no more than 2 support people in the waiting area - these visitors may rotate.   Pre-op nurse will coordinate an appropriate time for 1 ADULT support person, who may not rotate, to accompany patient in pre-op.  Children under the age of 63 must have an adult with them who is not the patient and must remain in the main waiting area with an adult.  If the patient needs to stay at the hospital during part of their recovery, the visitor guidelines for inpatient rooms apply.  Please refer to the [facility] website for the visitor guidelines for any additional information.   If you received a COVID test during your pre-op visit  it is requested that you wear a mask when out in public, stay away from anyone that may not be feeling well and notify your surgeon if you develop symptoms. If you have been in contact with anyone that has tested positive in the last 10 days please notify you surgeon.      Pre-operative 5 CHG Bathing Instructions   You can play a key  role in reducing the risk of infection after surgery. Your skin needs to be as free of germs as possible. You can reduce the number of germs on your skin by washing with CHG (chlorhexidine  gluconate) soap before surgery. CHG is an antiseptic soap that kills germs and continues to kill germs even after washing.   DO NOT use if you have an allergy to chlorhexidine /CHG or antibacterial soaps. If your skin becomes reddened or irritated, stop using the CHG and notify one of our RNs at [phone].   Please  shower with the CHG soap starting 4 days before surgery using the following schedule:     Please keep in mind the following:  DO NOT shave, including legs and underarms, starting the day of your first shower.   You may shave your face at any point before/day of surgery.  Place clean sheets on your bed the day you start using CHG soap. Use a clean washcloth (not used since being washed) for each shower. DO NOT sleep with pets once you start using the CHG.   CHG Shower Instructions:  Wash your face and private area with normal soap. If you choose to wash your hair, wash first with your normal shampoo.  After you use shampoo/soap, rinse your hair and body thoroughly to remove shampoo/soap residue.  Turn the water OFF and apply about 3 tablespoons (45 ml) of CHG soap to a CLEAN washcloth.  Apply CHG soap ONLY FROM YOUR NECK DOWN TO YOUR TOES (washing for 3-5 minutes)  DO NOT use CHG soap on face, private areas, open wounds, or sores.  Pay special attention to the area where your surgery is being performed.  If you are having back surgery, having someone wash your back for you may be helpful. Wait 2 minutes after CHG soap is applied, then you may rinse off the CHG soap.  Pat dry with a clean towel  Put on clean clothes/pajamas   If you choose to wear lotion, please use ONLY the CHG-compatible lotions that are listed below.  Additional instructions for the day of surgery: DO NOT APPLY any lotions, deodorants, cologne, or perfumes.   Do not bring valuables to the hospital. [facility] is not responsible for any belongings/valuables. Do not wear nail polish, gel polish, artificial nails, or any other type of covering on natural nails (fingers and toes) Do not wear jewelry or makeup Put on clean/comfortable clothes.  Please brush your teeth.  Ask your nurse before applying any prescription medications to the skin.     CHG Compatible Lotions   Aveeno Moisturizing lotion  Cetaphil  Moisturizing Cream  Cetaphil Moisturizing Lotion  Clairol Herbal Essence Moisturizing Lotion, Dry Skin  Clairol Herbal Essence Moisturizing Lotion, Extra Dry Skin  Clairol Herbal Essence Moisturizing Lotion, Normal Skin  Curel Age Defying Therapeutic Moisturizing Lotion with Alpha Hydroxy  Curel Extreme Care Body Lotion  Curel Soothing Hands Moisturizing Hand Lotion  Curel Therapeutic Moisturizing Cream, Fragrance-Free  Curel Therapeutic Moisturizing Lotion, Fragrance-Free  Curel Therapeutic Moisturizing Lotion, Original Formula  Eucerin Daily Replenishing Lotion  Eucerin Dry Skin Therapy Plus Alpha Hydroxy Crme  Eucerin Dry Skin Therapy Plus Alpha Hydroxy Lotion  Eucerin Original Crme  Eucerin Original Lotion  Eucerin Plus Crme Eucerin Plus Lotion  Eucerin TriLipid Replenishing Lotion  Keri Anti-Bacterial Hand Lotion  Keri Deep Conditioning Original Lotion Dry Skin Formula Softly Scented  Keri Deep Conditioning Original Lotion, Fragrance Free Sensitive Skin Formula  Keri Lotion Fast Absorbing Fragrance Free Sensitive Skin Formula  Keri Lotion Fast Absorbing Softly Scented Dry Skin Formula  Keri Original Lotion  Keri Skin Renewal Lotion Keri Silky Smooth Lotion  Keri Silky Smooth Sensitive Skin Lotion  Nivea Body Creamy Conditioning Patent examiner Moisturizing Lotion Nivea Crme  Nivea Skin Firming Lotion  NutraDerm 30 Skin Lotion  NutraDerm Skin Lotion  NutraDerm Therapeutic Skin Cream  NutraDerm Therapeutic Skin Lotion  ProShield Protective Hand Cream  Provon moisturizing lotion  Please read over the following fact sheets that you were given.

## 2023-08-09 ENCOUNTER — Other Ambulatory Visit: Payer: Self-pay | Admitting: Family Medicine

## 2023-08-09 ENCOUNTER — Encounter (HOSPITAL_COMMUNITY)
Admission: RE | Admit: 2023-08-09 | Discharge: 2023-08-09 | Disposition: A | Discharge status: Home / Self Care | Source: Ambulatory Visit | Attending: Orthopedic Surgery | Admitting: Orthopedic Surgery

## 2023-08-09 ENCOUNTER — Encounter (HOSPITAL_COMMUNITY): Payer: Self-pay

## 2023-08-09 ENCOUNTER — Other Ambulatory Visit: Payer: Self-pay

## 2023-08-09 VITALS — BP 160/74 | HR 58 | Temp 97.5°F | Resp 18 | Ht 60.0 in | Wt 129.5 lb

## 2023-08-09 DIAGNOSIS — R001 Bradycardia, unspecified: Secondary | ICD-10-CM | POA: Insufficient documentation

## 2023-08-09 DIAGNOSIS — I2510 Atherosclerotic heart disease of native coronary artery without angina pectoris: Secondary | ICD-10-CM | POA: Insufficient documentation

## 2023-08-09 DIAGNOSIS — R911 Solitary pulmonary nodule: Secondary | ICD-10-CM | POA: Insufficient documentation

## 2023-08-09 DIAGNOSIS — I700 Atherosclerosis of aorta: Secondary | ICD-10-CM | POA: Diagnosis not present

## 2023-08-09 DIAGNOSIS — Z87891 Personal history of nicotine dependence: Secondary | ICD-10-CM | POA: Diagnosis not present

## 2023-08-09 DIAGNOSIS — Z8543 Personal history of malignant neoplasm of ovary: Secondary | ICD-10-CM | POA: Diagnosis not present

## 2023-08-09 DIAGNOSIS — J439 Emphysema, unspecified: Secondary | ICD-10-CM | POA: Diagnosis not present

## 2023-08-09 DIAGNOSIS — Z853 Personal history of malignant neoplasm of breast: Secondary | ICD-10-CM | POA: Insufficient documentation

## 2023-08-09 DIAGNOSIS — Z9011 Acquired absence of right breast and nipple: Secondary | ICD-10-CM | POA: Insufficient documentation

## 2023-08-09 DIAGNOSIS — E041 Nontoxic single thyroid nodule: Secondary | ICD-10-CM | POA: Insufficient documentation

## 2023-08-09 DIAGNOSIS — L4050 Arthropathic psoriasis, unspecified: Secondary | ICD-10-CM | POA: Diagnosis not present

## 2023-08-09 DIAGNOSIS — M4856XA Collapsed vertebra, not elsewhere classified, lumbar region, initial encounter for fracture: Secondary | ICD-10-CM | POA: Diagnosis not present

## 2023-08-09 DIAGNOSIS — I129 Hypertensive chronic kidney disease with stage 1 through stage 4 chronic kidney disease, or unspecified chronic kidney disease: Secondary | ICD-10-CM | POA: Insufficient documentation

## 2023-08-09 DIAGNOSIS — Z85828 Personal history of other malignant neoplasm of skin: Secondary | ICD-10-CM | POA: Insufficient documentation

## 2023-08-09 DIAGNOSIS — Z01818 Encounter for other preprocedural examination: Secondary | ICD-10-CM | POA: Diagnosis not present

## 2023-08-09 DIAGNOSIS — Z85110 Personal history of malignant carcinoid tumor of bronchus and lung: Secondary | ICD-10-CM | POA: Insufficient documentation

## 2023-08-09 HISTORY — DX: Chronic kidney disease, unspecified: N18.9

## 2023-08-09 LAB — SURGICAL PCR SCREEN
MRSA, PCR: NEGATIVE
Staphylococcus aureus: NEGATIVE

## 2023-08-09 NOTE — Progress Notes (Signed)
 PCP - Dr. Laine Balls Cardiologist - Denies Pulmonologist - Dr. Dedra Sanders - last office visit 08/02/2023  PPM/ICD - Denies Device Orders - n/a Rep Notified - n/a  Chest x-ray - Denies - CT Chest 06/05/2023 EKG - 08/09/2023 Stress Test - 08/2005 ECHO - Denies Cardiac Cath - Denies  Sleep Study - Denies CPAP - n/a  No DM  Last dose of GLP1 agonist- n/a GLP1 instructions: n/a  Blood Thinner Instructions: n/a Aspirin Instructions: n/a  ERAS Protcol - Clear liquids until 1240 afternoon of surgery PRE-SURGERY Ensure or G2- Ensure given to pt with instructions  COVID TEST- n/a   Anesthesia review: Yes. Pt had a pre-surgical assessment with pulmonary on 08/02/2023.   Patient denies shortness of breath, fever, cough and chest pain at PAT appointment. Pt denies any respiratory illness/infection in the last two months.   All instructions explained to the patient, with a verbal understanding of the material. Patient agrees to go over the instructions while at home for a better understanding. Patient also instructed to self quarantine after being tested for COVID-19. The opportunity to ask questions was provided.

## 2023-08-09 NOTE — Telephone Encounter (Signed)
 Last office visit 08/01/2023 for CPE.  Last refilled 07/12/23 for #15 by Dr. Avelina.  Next appt: No future appointments with PCP.

## 2023-08-10 NOTE — Anesthesia Preprocedure Evaluation (Addendum)
 Anesthesia Evaluation  Patient identified by MRN, date of birth, ID band Patient awake    Reviewed: Allergy & Precautions, NPO status , Patient's Chart, lab work & pertinent test results  History of Anesthesia Complications (+) history of anesthetic complications (lost memory)  Airway Mallampati: III  TM Distance: >3 FB Neck ROM: Full   Comment: Previous grade I view with McGrath 3, easy mask Dental  (+) Dental Advisory Given Removable implants:   Pulmonary neg shortness of breath, neg sleep apnea, COPD (used inhaler this morning),  COPD inhaler, neg recent URI, former smoker Lung mass   Pulmonary exam normal breath sounds clear to auscultation       Cardiovascular hypertension (amlodipine , losartan ), Pt. on medications (-) angina (-) Past MI, (-) Cardiac Stents and (-) CABG (-) dysrhythmias  Rhythm:Regular Rate:Normal     Neuro/Psych neg Seizures PSYCHIATRIC DISORDERS  Depression    H/o C1 fracture in 2023    GI/Hepatic Neg liver ROS,GERD  Medicated,,Diverticulosis    Endo/Other  negative endocrine ROS    Renal/GU CRFRenal disease     Musculoskeletal  (+) Arthritis  (psoriatic),  Osteopenia    Abdominal   Peds  Hematology  (+) Blood dyscrasia, anemia Lab Results      Component                Value               Date                      WBC                      12.4 (H)            07/25/2023                HGB                      11.0 (L)            07/25/2023                HCT                      33.8 (L)            07/25/2023                MCV                      81.4                07/25/2023                PLT                      294.0               07/25/2023              Anesthesia Other Findings   Reproductive/Obstetrics Ovarian cancer, h/o right breast cancer                              Anesthesia Physical Anesthesia Plan  ASA: 3  Anesthesia Plan: General    Post-op Pain Management: Ofirmev  IV (intra-op)*   Induction: Intravenous  PONV Risk Score and Plan: 3 and Ondansetron ,  Dexamethasone  and Treatment may vary due to age or medical condition  Airway Management Planned: Oral ETT and Video Laryngoscope Planned  Additional Equipment:   Intra-op Plan:   Post-operative Plan: Extubation in OR  Informed Consent: I have reviewed the patients History and Physical, chart, labs and discussed the procedure including the risks, benefits and alternatives for the proposed anesthesia with the patient or authorized representative who has indicated his/her understanding and acceptance.     Dental advisory given  Plan Discussed with: CRNA and Anesthesiologist  Anesthesia Plan Comments: (PAT note written 08/10/2023 by Allison Zelenak, PA-C.  Risks of general anesthesia discussed including, but not limited to, sore throat, hoarse voice, chipped/damaged teeth, injury to vocal cords, nausea and vomiting, allergic reactions, lung infection, heart attack, stroke, and death. All questions answered.   )         Anesthesia Quick Evaluation

## 2023-08-10 NOTE — Progress Notes (Signed)
 Anesthesia Chart Review: Pamela Morales  Case: [id number] Date/Time: 08/16/23 1526   Procedure: KYPHOPLASTY   Anesthesia type: General   Pre-op diagnosis: COMPRESSION FRACTURE   [facility]OM 05 / [facility]   Surgeons: Beuford Anes, MD       DISCUSSION: Patient is an 83 year old female scheduled for the above procedure.   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   She had preoperative pulmonology evaluation by Dr. Tamea on 08/02/23. COPD is well managed with Trelegy. No recent exacerbations. S/p LUL SBRT with lung cancer is under surveillance with follow-up scans planned in December. In regards to surgery she wrote:  Lumbar vertebral compression fracture (L4) with pain L4 vertebral compression fracture causing pain, affecting sleep. Current medication causes dry mouth and poor sleep, with persistent leg pain. Vertebroplasty with cement augmentation is scheduled for August 13th to prevent further complications, including potential impact on breathing.  Patient low risk for the proposed procedure. - Proceed with vertebroplasty on August 13th   Anesthesia team to evaluate on the day of surgery.   VS: BP (!) 160/74   Pulse (!) 58   Temp (!) 36.4 C   Resp 18   Ht 5' (1.524 m)   Wt 58.7 kg   SpO2 97%   BMI 25.29 kg/m   PROVIDERS: Tower, Laine LABOR, MD is PCP  Tamea Saunas, MD is pulmonologist  Lenn Aran, MD is RAD-ONC Guilford Lye, MD is HEM-ONC Ishmael Slough, MD is  rheumatologist Clois Fret, MD is neurosurgeon Jerrye Mar, MD is nephrologist Robinson Pao, MD is dermatologist  LABS: Most recent labs (through [facility] PCP) include: Lab Results  Component Value Date   WBC 12.4 (H) 07/25/2023   HGB 11.0 (L) 07/25/2023   HCT 33.8 (L) 07/25/2023   PLT 294.0 07/25/2023   GLUCOSE 98 07/25/2023   CHOL 183 07/25/2023   TRIG 166.0 (H) 07/25/2023   HDL 37.00 (L) 07/25/2023   LDLDIRECT 111.0 07/16/2019   LDLCALC 112 (H) 07/25/2023   ALT 26 07/25/2023   AST 24 07/25/2023   NA 137 07/25/2023   K 4.5 07/25/2023   CL 102 07/25/2023   CREATININE 1.46 (H) 07/25/2023   BUN 27 (H) 07/25/2023   CO2 27 07/25/2023   TSH 3.90 07/25/2023    IMAGES: Xray L-spine 07/12/23: IMPRESSION: - Interval development of moderate compression deformity of L4 vertebral body concerning for fracture of indeterminate age. MRI may be performed for further evaluation. - Aortic Atherosclerosis (ICD10-I70.0).  CT Chest 06/05/23: IMPRESSION: 1. Mild decreased in size a left apical pulmonary nodule with similar surrounding radiation change. 2. No thoracic adenopathy. 3. Right lower lobe ground-glass nodule measures similar at 9 mm but is slightly more distinct today. Recommend attention on follow-up. 4. Incidental findings, including: Aortic atherosclerosis (ICD10-I70.0), coronary artery atherosclerosis and emphysema (ICD10-J43.9). Pulmonary artery enlargement suggests pulmonary arterial hypertension. 5. Tiny hypoattenuating splenic lesions are unchanged, nonspecific. Most likely benign/incidental lesions such as hemangiomas.     EKG: 08/09/23: Sinus bradycardia at 52 bpm Otherwise normal ECG No significant change since last tracing Confirmed by Okey Moccasin ([id number]) on 08/10/2023 4:43:25 PM   CV:  Reportedly had a prior stress test in August 2007.    Past Medical History:  Diagnosis Date   Allergy    Anemia    Aortic atherosclerosis (HCC)    Breast cancer (HCC)  2006?   C1 cervical fracture (HCC)    placed in c-collar-to f/u with neurosurgery on 06-21-21 in [place]   CA - cancer of ovary    Cancer (HCC) 02/10   R breast  (est rec neg and brac neg)   Cataract    FORMING    Chronic kidney disease    Complication of anesthesia    deviated septum-lost memory for about 1 week after anesthesia   COPD (chronic obstructive pulmonary disease) (HCC)    DDD (degenerative disc disease)    in back with chronic pain   Depression    Diverticulosis    DOE (dyspnea on exertion)    Eczema    Elevated serum creatinine    sees nephrology yearly   Elevated TSH    Emphysema of lung (HCC)    Family history of colon cancer    GERD (gastroesophageal reflux disease)    Hearing loss    Hypertension    Hypertriglyceridemia    Mass of left lung    Osteopenia    Psoriatic arthritis (HCC)    Rosacea    Skin cancer 2000?   Squamous cell carcinoma of left lung (HCC)    Thyroid  nodule    Ventral hernia    Zoster 02/2007    Past Surgical History:  Procedure Laterality Date   ABDOMINAL HYSTERECTOMY  1996   BSO fibroids, incidental ovarian CA finding   BASAL CELL CARCINOMA EXCISION  09/2016   BLADDER SUSPENSION     CATARACT EXTRACTION W/ INTRAOCULAR LENS IMPLANT Bilateral    CHOLECYSTECTOMY  08/1996   10 yrs ago   COLONOSCOPY     LIPOMA EXCISION Right    Abdomen   MASTECTOMY Right 2015   Lymph nodes removed   NASAL SEPTUM SURGERY     prior to 2000s   POLYPECTOMY     VIDEO BRONCHOSCOPY WITH ENDOBRONCHIAL ULTRASOUND Left 06/16/2021   Procedure: POSSIBLE VIDEO BRONCHOSCOPY WITH ENDOBRONCHIAL ULTRASOUND;  Surgeon: Tamea Dedra CROME, MD;  Location: [facility] ORS;  Service: Pulmonary;  Laterality: Left;   XI ROBOTIC ASSISTED VENTRAL HERNIA N/A 08/16/2022   Procedure: XI ROBOTIC ASSISTED VENTRAL HERNIA;  Surgeon: Desiderio Schanz, MD;  Location: [facility] ORS;  Service: General;  Laterality: N/A;    MEDICATIONS:  acetaminophen  (TYLENOL ) 500 MG tablet   albuterol   (VENTOLIN  HFA) 108 (90 Base) MCG/ACT inhaler   amLODipine  (NORVASC ) 10 MG tablet   Calcium Carbonate-Vitamin D  (CALTRATE 600+D PO)   cyclobenzaprine  (FLEXERIL ) 10 MG tablet   diphenhydrAMINE (BENADRYL) 50 MG tablet   famotidine  (PEPCID ) 20 MG tablet   FLUoxetine  (PROZAC ) 20 MG capsule   losartan  (COZAAR ) 50 MG tablet   MAGNESIUM PO   MELATONIN PO   methocarbamol (ROBAXIN) 500 MG tablet   Multiple Vitamin (MULTIVITAMIN) tablet   Multiple Vitamins-Minerals (PRESERVISION AREDS 2 PO)   Secukinumab, 300 MG Dose, (COSENTYX, 300 MG DOSE,) 150 MG/ML SOSY   traMADol (ULTRAM) 50 MG tablet   TRELEGY ELLIPTA  100-62.5-25 MCG/ACT AEPB   No current facility-administered medications for this encounter.    Isaiah Ruder, PA-C Surgical Short Stay/Anesthesiology [facility] Phone [phone] [facility] Phone [phone] 08/10/2023 5:20 PM

## 2023-08-16 ENCOUNTER — Other Ambulatory Visit: Payer: Self-pay

## 2023-08-16 ENCOUNTER — Ambulatory Visit (HOSPITAL_BASED_OUTPATIENT_CLINIC_OR_DEPARTMENT_OTHER): Admitting: Anesthesiology

## 2023-08-16 ENCOUNTER — Ambulatory Visit (HOSPITAL_COMMUNITY): Payer: Self-pay | Admitting: Vascular Surgery

## 2023-08-16 ENCOUNTER — Ambulatory Visit (HOSPITAL_COMMUNITY)
Admission: RE | Admit: 2023-08-16 | Discharge: 2023-08-16 | Disposition: A | Discharge status: Home / Self Care | Attending: Orthopedic Surgery | Admitting: Orthopedic Surgery

## 2023-08-16 ENCOUNTER — Ambulatory Visit (HOSPITAL_COMMUNITY)

## 2023-08-16 ENCOUNTER — Encounter (HOSPITAL_COMMUNITY): Payer: Self-pay | Admitting: Orthopedic Surgery

## 2023-08-16 ENCOUNTER — Encounter (HOSPITAL_COMMUNITY)
Admission: RE | Disposition: A | Discharge status: Home / Self Care | Payer: Self-pay | Source: Home / Self Care | Attending: Orthopedic Surgery

## 2023-08-16 DIAGNOSIS — G8929 Other chronic pain: Secondary | ICD-10-CM | POA: Diagnosis not present

## 2023-08-16 DIAGNOSIS — K5790 Diverticulosis of intestine, part unspecified, without perforation or abscess without bleeding: Secondary | ICD-10-CM | POA: Insufficient documentation

## 2023-08-16 DIAGNOSIS — J449 Chronic obstructive pulmonary disease, unspecified: Secondary | ICD-10-CM | POA: Diagnosis not present

## 2023-08-16 DIAGNOSIS — M8008XA Age-related osteoporosis with current pathological fracture, vertebra(e), initial encounter for fracture: Secondary | ICD-10-CM | POA: Diagnosis not present

## 2023-08-16 DIAGNOSIS — J439 Emphysema, unspecified: Secondary | ICD-10-CM | POA: Diagnosis not present

## 2023-08-16 DIAGNOSIS — Z87891 Personal history of nicotine dependence: Secondary | ICD-10-CM | POA: Diagnosis not present

## 2023-08-16 DIAGNOSIS — F32A Depression, unspecified: Secondary | ICD-10-CM | POA: Insufficient documentation

## 2023-08-16 DIAGNOSIS — Z79899 Other long term (current) drug therapy: Secondary | ICD-10-CM | POA: Diagnosis not present

## 2023-08-16 DIAGNOSIS — N189 Chronic kidney disease, unspecified: Secondary | ICD-10-CM | POA: Insufficient documentation

## 2023-08-16 DIAGNOSIS — L4050 Arthropathic psoriasis, unspecified: Secondary | ICD-10-CM | POA: Insufficient documentation

## 2023-08-16 DIAGNOSIS — I10 Essential (primary) hypertension: Secondary | ICD-10-CM | POA: Diagnosis not present

## 2023-08-16 DIAGNOSIS — K219 Gastro-esophageal reflux disease without esophagitis: Secondary | ICD-10-CM | POA: Insufficient documentation

## 2023-08-16 DIAGNOSIS — I129 Hypertensive chronic kidney disease with stage 1 through stage 4 chronic kidney disease, or unspecified chronic kidney disease: Secondary | ICD-10-CM | POA: Diagnosis not present

## 2023-08-16 DIAGNOSIS — R918 Other nonspecific abnormal finding of lung field: Secondary | ICD-10-CM | POA: Diagnosis not present

## 2023-08-16 DIAGNOSIS — M8088XA Other osteoporosis with current pathological fracture, vertebra(e), initial encounter for fracture: Secondary | ICD-10-CM | POA: Diagnosis not present

## 2023-08-16 DIAGNOSIS — D649 Anemia, unspecified: Secondary | ICD-10-CM | POA: Insufficient documentation

## 2023-08-16 DIAGNOSIS — M47817 Spondylosis without myelopathy or radiculopathy, lumbosacral region: Secondary | ICD-10-CM | POA: Diagnosis not present

## 2023-08-16 DIAGNOSIS — M4856XA Collapsed vertebra, not elsewhere classified, lumbar region, initial encounter for fracture: Secondary | ICD-10-CM | POA: Diagnosis not present

## 2023-08-16 HISTORY — PX: KYPHOPLASTY: SHX5884

## 2023-08-16 SURGERY — KYPHOPLASTY
Anesthesia: General | Site: Spine Lumbar

## 2023-08-16 MED ORDER — AMISULPRIDE (ANTIEMETIC) 5 MG/2ML IV SOLN: 10.0000 mg | Freq: Once | INTRAVENOUS | Status: DC | PRN

## 2023-08-16 MED ORDER — PHENYLEPHRINE HCL-NACL 20-0.9 MG/250ML-% IV SOLN
INTRAVENOUS | Status: DC | PRN
  Administered 2023-08-16 (×2): 25 ug/min via INTRAVENOUS

## 2023-08-16 MED ORDER — 0.9 % SODIUM CHLORIDE (POUR BTL) OPTIME
TOPICAL | Status: DC | PRN
  Administered 2023-08-16 (×2): 1000 mL

## 2023-08-16 MED ORDER — POVIDONE-IODINE 7.5 % EX SOLN: Freq: Once | CUTANEOUS | Status: DC

## 2023-08-16 MED ORDER — OXYCODONE HCL 5 MG PO TABS
5.0000 mg | ORAL_TABLET | Freq: Once | ORAL | Status: AC
  Administered 2023-08-16 (×2): 5 mg via ORAL

## 2023-08-16 MED ORDER — FENTANYL CITRATE (PF) 100 MCG/2ML IJ SOLN
INTRAMUSCULAR | Status: AC
  Filled 2023-08-16: qty 2

## 2023-08-16 MED ORDER — DEXAMETHASONE SODIUM PHOSPHATE 10 MG/ML IJ SOLN
INTRAMUSCULAR | Status: DC | PRN
  Administered 2023-08-16 (×2): 5 mg via INTRAVENOUS

## 2023-08-16 MED ORDER — OXYCODONE HCL 5 MG PO TABS
ORAL_TABLET | ORAL | Status: AC
  Filled 2023-08-16: qty 1

## 2023-08-16 MED ORDER — ONDANSETRON HCL 4 MG/2ML IJ SOLN
INTRAMUSCULAR | Status: DC | PRN
  Administered 2023-08-16 (×2): 4 mg via INTRAVENOUS

## 2023-08-16 MED ORDER — ORAL CARE MOUTH RINSE: 15.0000 mL | Freq: Once | OROMUCOSAL | Status: AC

## 2023-08-16 MED ORDER — TRAMADOL HCL 50 MG PO TABS: 50.0000 mg | ORAL_TABLET | Freq: Four times a day (QID) | ORAL | 0 refills | Status: DC | PRN

## 2023-08-16 MED ORDER — FENTANYL CITRATE (PF) 250 MCG/5ML IJ SOLN
INTRAMUSCULAR | Status: DC | PRN
  Administered 2023-08-16 (×2): 100 ug via INTRAVENOUS

## 2023-08-16 MED ORDER — LIDOCAINE 2% (20 MG/ML) 5 ML SYRINGE
INTRAMUSCULAR | Status: DC | PRN
  Administered 2023-08-16 (×2): 60 mg via INTRAVENOUS

## 2023-08-16 MED ORDER — PROPOFOL 1000 MG/100ML IV EMUL
INTRAVENOUS | Status: AC
  Filled 2023-08-16: qty 200

## 2023-08-16 MED ORDER — FENTANYL CITRATE (PF) 100 MCG/2ML IJ SOLN
25.0000 ug | INTRAMUSCULAR | Status: DC | PRN
  Administered 2023-08-16: 50 ug via INTRAVENOUS
  Administered 2023-08-16 (×3): 25 ug via INTRAVENOUS
  Administered 2023-08-16: 50 ug via INTRAVENOUS
  Administered 2023-08-16: 25 ug via INTRAVENOUS
  Administered 2023-08-16 (×2): 50 ug via INTRAVENOUS

## 2023-08-16 MED ORDER — ROCURONIUM BROMIDE 10 MG/ML (PF) SYRINGE
PREFILLED_SYRINGE | INTRAVENOUS | Status: DC | PRN
  Administered 2023-08-16 (×2): 35 mg via INTRAVENOUS

## 2023-08-16 MED ORDER — CEFAZOLIN SODIUM-DEXTROSE 2-4 GM/100ML-% IV SOLN
2.0000 g | INTRAVENOUS | Status: AC
  Administered 2023-08-16 (×2): 2 g via INTRAVENOUS
  Filled 2023-08-16: qty 100

## 2023-08-16 MED ORDER — PROPOFOL 10 MG/ML IV BOLUS
INTRAVENOUS | Status: DC | PRN
  Administered 2023-08-16 (×2): 100 mg via INTRAVENOUS

## 2023-08-16 MED ORDER — BACITRACIN ZINC 500 UNIT/GM EX OINT
TOPICAL_OINTMENT | CUTANEOUS | Status: AC
  Filled 2023-08-16: qty 28.35

## 2023-08-16 MED ORDER — BUPIVACAINE-EPINEPHRINE (PF) 0.25% -1:200000 IJ SOLN: INTRAMUSCULAR | Status: DC | PRN

## 2023-08-16 MED ORDER — IOHEXOL 300 MG/ML  SOLN
INTRAMUSCULAR | Status: DC | PRN
  Administered 2023-08-16 (×2): 100 mL

## 2023-08-16 MED ORDER — PHENYLEPHRINE HCL-NACL 20-0.9 MG/250ML-% IV SOLN
INTRAVENOUS | Status: AC
  Filled 2023-08-16: qty 250

## 2023-08-16 MED ORDER — SUGAMMADEX SODIUM 200 MG/2ML IV SOLN
INTRAVENOUS | Status: DC | PRN
  Administered 2023-08-16 (×2): 150 mg via INTRAVENOUS

## 2023-08-16 MED ORDER — METHOCARBAMOL 500 MG PO TABS: 500.0000 mg | ORAL_TABLET | Freq: Four times a day (QID) | ORAL | 0 refills | Status: DC | PRN

## 2023-08-16 MED ORDER — CHLORHEXIDINE GLUCONATE 0.12 % MT SOLN
15.0000 mL | Freq: Once | OROMUCOSAL | Status: AC
  Administered 2023-08-16 (×2): 15 mL via OROMUCOSAL
  Filled 2023-08-16: qty 15

## 2023-08-16 MED ORDER — STERILE WATER FOR IRRIGATION IR SOLN
Status: DC | PRN
Start: 2023-08-16 — End: 2023-08-16
  Administered 2023-08-16 (×2): 1000 mL

## 2023-08-16 MED ORDER — EPHEDRINE SULFATE-NACL 50-0.9 MG/10ML-% IV SOSY
PREFILLED_SYRINGE | INTRAVENOUS | Status: DC | PRN
  Administered 2023-08-16 (×2): 5 mg via INTRAVENOUS

## 2023-08-16 MED ORDER — FENTANYL CITRATE (PF) 250 MCG/5ML IJ SOLN
INTRAMUSCULAR | Status: AC
  Filled 2023-08-16: qty 5

## 2023-08-16 MED ORDER — LACTATED RINGERS IV SOLN: INTRAVENOUS | Status: DC

## 2023-08-16 MED ORDER — BUPIVACAINE-EPINEPHRINE (PF) 0.25% -1:200000 IJ SOLN
INTRAMUSCULAR | Status: AC
  Filled 2023-08-16: qty 30

## 2023-08-16 SURGICAL SUPPLY — 40 items
BAG COUNTER SPONGE SURGICOUNT (BAG) ×2 IMPLANT
BLADE SURG 15 STRL LF DISP TIS (BLADE) ×2 IMPLANT
CEMENT KYPHON C01A KIT/MIXER (Cement) IMPLANT
CEMENT KYPHON CX01A KIT/MIXER (Cement) IMPLANT
COVER MAYO STAND STRL (DRAPES) ×2 IMPLANT
COVER SURGICAL LIGHT HANDLE (MISCELLANEOUS) ×2 IMPLANT
CURETTE EXPRESS SZ2 7MM (INSTRUMENTS) IMPLANT
DRAPE C-ARM 42X72 X-RAY (DRAPES) ×2 IMPLANT
DRAPE INCISE IOBAN 66X45 STRL (DRAPES) ×2 IMPLANT
DRAPE LAPAROTOMY T 102X78X121 (DRAPES) ×2 IMPLANT
DRAPE SURG 17X23 STRL (DRAPES) ×8 IMPLANT
DRAPE WARM FLUID 44X44 (DRAPES) ×2 IMPLANT
DURAPREP 26ML APPLICATOR (WOUND CARE) ×2 IMPLANT
GAUZE 4X4 16PLY ~~LOC~~+RFID DBL (SPONGE) ×2 IMPLANT
GAUZE SPONGE 2X2 8PLY STRL LF (GAUZE/BANDAGES/DRESSINGS) ×2 IMPLANT
GAUZE SPONGE 2X2 STRL 8-PLY (GAUZE/BANDAGES/DRESSINGS) IMPLANT
GLOVE BIO SURGEON STRL SZ 6.5 (GLOVE) ×2 IMPLANT
GLOVE BIO SURGEON STRL SZ8 (GLOVE) ×2 IMPLANT
GLOVE BIOGEL PI IND STRL 7.0 (GLOVE) ×2 IMPLANT
GLOVE BIOGEL PI IND STRL 8 (GLOVE) ×2 IMPLANT
GLOVE SURG ENC MOIS LTX SZ6.5 (GLOVE) ×2 IMPLANT
GOWN STRL REUS W/ TWL LRG LVL3 (GOWN DISPOSABLE) ×4 IMPLANT
GOWN STRL REUS W/ TWL XL LVL3 (GOWN DISPOSABLE) ×2 IMPLANT
INTRODUCER DEVICE OSTEO LEVEL (INTRODUCER) IMPLANT
KIT BASIN OR (CUSTOM PROCEDURE TRAY) ×2 IMPLANT
KIT TURNOVER KIT B (KITS) ×2 IMPLANT
NDL HYPO 25X1 1.5 SAFETY (NEEDLE) ×2 IMPLANT
NDL SPNL 18GX3.5 QUINCKE PK (NEEDLE) ×4 IMPLANT
NEEDLE HYPO 25X1 1.5 SAFETY (NEEDLE) ×1 IMPLANT
NEEDLE SPNL 18GX3.5 QUINCKE PK (NEEDLE) ×2 IMPLANT
NS IRRIG 1000ML POUR BTL (IV SOLUTION) ×2 IMPLANT
PACK UNIVERSAL I (CUSTOM PROCEDURE TRAY) ×2 IMPLANT
PAD ARMBOARD POSITIONER FOAM (MISCELLANEOUS) ×4 IMPLANT
POSITIONER HEAD PRONE TRACH (MISCELLANEOUS) ×2 IMPLANT
SUT MNCRL AB 4-0 PS2 18 (SUTURE) ×2 IMPLANT
SYR BULB IRRIG 60ML STRL (SYRINGE) ×2 IMPLANT
SYR CONTROL 10ML LL (SYRINGE) ×2 IMPLANT
TOWEL GREEN STERILE (TOWEL DISPOSABLE) ×2 IMPLANT
TOWEL GREEN STERILE FF (TOWEL DISPOSABLE) ×2 IMPLANT
TRAY KYPHOPAK 15/3 ONESTEP 1ST (MISCELLANEOUS) IMPLANT

## 2023-08-16 NOTE — Anesthesia Postprocedure Evaluation (Signed)
 Anesthesia Post Note  Patient: Pamela Morales  Procedure(s) Performed: KYPHOPLASTY (Spine Lumbar)     Patient location during evaluation: PACU Anesthesia Type: General Level of consciousness: awake Pain management: pain level controlled Vital Signs Assessment: post-procedure vital signs reviewed and stable Respiratory status: spontaneous breathing, nonlabored ventilation and respiratory function stable Cardiovascular status: blood pressure returned to baseline and stable Postop Assessment: no apparent nausea or vomiting Anesthetic complications: no   No notable events documented.  Last Vitals:  Vitals:   08/16/23 1615 08/16/23 1630  BP: (!) 163/72 (!) 160/72  Pulse: 61 63  Resp: 15 13  Temp:    SpO2: 98% 94%    Last Pain:  Vitals:   08/16/23 1630  TempSrc:   PainSc: Asleep                 Delon Aisha Arch

## 2023-08-16 NOTE — Op Note (Signed)
 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  SURGEON:  Oneil Priestly, MD.  ASSISTANTBETHA Ileana Clara, PA-C  ANESTHESIA:  General endotracheal anesthesia.  COMPLICATIONS:  None.  DISPOSITION:  Stable.  ESTIMATED BLOOD LOSS:  Minimal.  INDICATIONS FOR SURGERY:  Briefly, Ms. Evertt is a very pleasant 83 y.o.- year-old female, who did have an onset of pain in her low back approximately 6 weeks ago.   Her pain has been rather severe.  The patient's imaging studies did clearly reveal an L4 compression fracture. Given her ongoing severe pain and dysfunction, we did discuss proceeding with the procedure noted above.  I did fully discuss the procedure with the patient, and she did wish to proceed.  OPERATIVE DETAILS:  On 08/16/2023, the patient was brought to surgery and general endotracheal anesthesia was administered.  The patient was placed prone on a well-padded flat Jackson bed with gel rolls placed under the patient's chest and hips.  Antibiotics were given.  AP and lateral fluoroscopy was brought into the field.  The L4 pedicles were marked out in the usual fashion.  After a time-out procedure was performed, I did advance Jamshidis across the L4 pedicles on the right and left sides.  I then drilled through the Jamshidis.  I then inserted kyphoplasty balloons and I was able to inflate the balloons with approximately 4cc of contrast.  Partial restoration of the superior endplate was noted.  The balloons were deflated then removed.  At this point, after the cement was mixed, a total of approximately 6cc of cement was injected through the right and left cannulas, half on the right, and half on  the left.  Excellent interdigitation of cement was identified. The cement was then allowed to harden, after which point the Jamshidis were removed.  The wound was then irrigated and closed using 4-0 Monocryl.  Bacitracin  and a sterile dressing were applied.  The patient was then awoken from general endotracheal anesthesia and transferred to recovery in stable condition.  Oneil Priestly, MD

## 2023-08-16 NOTE — H&P (Signed)
 PREOPERATIVE H&P  Chief Complaint: Low back pain  HPI: Pamela Morales is a 83 y.o. female who presents with ongoing pain in the low back  The patient's imaging studies were consistent with a subacute L4 compression fracture.  Patient has failed multiple forms of conservative care and continues to have pain (see office notes for additional details regarding the patient's full course of treatment)  Past Medical History:  Diagnosis Date   Allergy    Anemia    Aortic atherosclerosis (HCC)    Breast cancer (HCC) 2006?   C1 cervical fracture (HCC)    placed in c-collar-to f/u with neurosurgery on 06-21-21 in [place]   CA - cancer of ovary    Cancer (HCC) 02/10   R breast  (est rec neg and brac neg)   Cataract    FORMING    Chronic kidney disease    Complication of anesthesia    deviated septum-lost memory for about 1 week after anesthesia   COPD (chronic obstructive pulmonary disease) (HCC)    DDD (degenerative disc disease)    in back with chronic pain   Depression    Diverticulosis    DOE (dyspnea on exertion)    Eczema    Elevated serum creatinine    sees nephrology yearly   Elevated TSH    Emphysema of lung (HCC)    Family history of colon cancer    GERD (gastroesophageal reflux disease)    Hearing loss    Hypertension    Hypertriglyceridemia    Mass of left lung    Osteopenia    Psoriatic arthritis (HCC)    Rosacea    Skin cancer 2000?   Squamous cell carcinoma of left lung (HCC)    Thyroid  nodule    Ventral hernia    Zoster 02/2007   Past Surgical History:  Procedure Laterality Date   ABDOMINAL HYSTERECTOMY  1996   BSO fibroids, incidental ovarian CA finding   BASAL CELL CARCINOMA EXCISION  09/2016   BLADDER SUSPENSION     CATARACT EXTRACTION W/ INTRAOCULAR LENS IMPLANT Bilateral    CHOLECYSTECTOMY  08/1996   10 yrs ago   COLONOSCOPY     LIPOMA EXCISION Right    Abdomen   MASTECTOMY Right 2015   Lymph nodes removed   NASAL SEPTUM SURGERY      prior to 2000s   POLYPECTOMY     VIDEO BRONCHOSCOPY WITH ENDOBRONCHIAL ULTRASOUND Left 06/16/2021   Procedure: POSSIBLE VIDEO BRONCHOSCOPY WITH ENDOBRONCHIAL ULTRASOUND;  Surgeon: Tamea Dedra CROME, MD;  Location: [facility] ORS;  Service: Pulmonary;  Laterality: Left;   XI ROBOTIC ASSISTED VENTRAL HERNIA N/A 08/16/2022   Procedure: XI ROBOTIC ASSISTED VENTRAL HERNIA;  Surgeon: Desiderio Schanz, MD;  Location: [facility] ORS;  Service: General;  Laterality: N/A;   Social History   Socioeconomic History   Marital status: Married    Spouse name: Not on file   Number of children: Not on file   Years of education: Not on file   Highest education level: Some college, no degree  Occupational History   Not on file  Tobacco Use   Smoking status: Former    Current packs/day: 0.00    Average packs/day: 2.0 packs/day for 10.0 years (20.0 ttl pk-yrs)    Types: Cigarettes    Start date: 01/04/1980    Quit date: 01/03/1990    Years since quitting: 33.6    Passive exposure: Past   Smokeless tobacco: Never  Vaping Use  Vaping status: Never Used  Substance and Sexual Activity   Alcohol use: Not Currently    Alcohol/week: 5.0 standard drinks of alcohol    Types: 5 Glasses of wine per week   Drug use: No   Sexual activity: Not Currently  Other Topics Concern   Not on file  Social History Narrative   Not on file   Social Drivers of Health   Financial Resource Strain: Low Risk  (09/13/2022)   Overall Financial Resource Strain (CARDIA)    Difficulty of Paying Living Expenses: Not hard at all  Food Insecurity: No Food Insecurity (09/13/2022)   Hunger Vital Sign    Worried About Running Out of Food in the Last Year: Never true    Ran Out of Food in the Last Year: Never true  Transportation Needs: No Transportation Needs (09/13/2022)   PRAPARE - Administrator, Civil Service (Medical): No    Lack of Transportation (Non-Medical): No  Physical Activity: Sufficiently Active (09/13/2022)   Exercise  Vital Sign    Days of Exercise per Week: 4 days    Minutes of Exercise per Session: 90 min  Stress: No Stress Concern Present (09/13/2022)   [employer] of Occupational Health - Occupational Stress Questionnaire    Feeling of Stress : Not at all  Social Connections: Socially Integrated (09/13/2022)   Social Connection and Isolation Panel    Frequency of Communication with Friends and Family: More than three times a week    Frequency of Social Gatherings with Friends and Family: More than three times a week    Attends Religious Services: More than 4 times per year    Active Member of [employer] or Organizations: Yes    Attends Engineer, structural: More than 4 times per year    Marital Status: Married   Family History  Problem Relation Age of Onset   Hypertension Mother    Heart disease Mother        CAD   Stomach cancer Mother 55   Cancer Mother    Lung cancer Father 58   Cancer Father    Early death Father    Cancer Sister        colon, skin   Heart disease Sister 34       MI   Diabetes Sister    Colon cancer Sister    Head & neck cancer Sister 17   Lung cancer Sister    Melanoma Sister        multiple on head   Heart disease Brother        HTN and MI   Hypertension Brother    Kidney disease Brother    Cancer Maternal Aunt        unk type   Cancer Paternal Aunt        unk type   Cancer Paternal Uncle        unk type   Colon cancer Daughter 33   Cancer Son    Cancer Sister    Hypertension Sister    Heart disease Daughter    Esophageal cancer Neg Hx    Colon polyps Neg Hx    Allergies  Allergen Reactions   Sulfa Antibiotics     Pt unsure of reaction   Prior to Admission medications   Medication Sig Start Date End Date Taking? Authorizing Provider  acetaminophen  (TYLENOL ) 500 MG tablet Take 2 tablets (1,000 mg total) by mouth every 6 (six) hours as needed for mild  pain. 08/16/22  Yes Piscoya, Jose, MD  albuterol  (VENTOLIN  HFA) 108 (90 Base) MCG/ACT  inhaler Inhale 2 puffs into the lungs every 6 (six) hours as needed. 08/02/23  Yes Tamea Dedra CROME, MD  amLODipine  (NORVASC ) 10 MG tablet TAKE 1 TABLET BY MOUTH EVERY DAY Patient taking differently: Take 10 mg by mouth at bedtime. 07/24/23  Yes Tower, Laine LABOR, MD  Calcium Carbonate-Vitamin D  (CALTRATE 600+D PO) Take 2 tablets by mouth every evening.   Yes [provider]  diphenhydrAMINE (BENADRYL) 50 MG tablet Take 50 mg by mouth at bedtime. JET-ASLEEP DOUBLE STRENGTH NIGHTTIME SLEEP AID   Yes [provider]  famotidine  (PEPCID ) 20 MG tablet TAKE 1 TABLET BY MOUTH TWICE A DAY 07/26/23  Yes Tower, Marne A, MD  FLUoxetine  (PROZAC ) 20 MG capsule TAKE 3 CAPSULES BY MOUTH EVERY DAY Patient taking differently: Take 40 mg by mouth in the morning. 06/23/23  Yes Tower, Laine LABOR, MD  losartan  (COZAAR ) 50 MG tablet TAKE 1 TABLET BY MOUTH EVERY DAY 06/23/23  Yes Tower, Marne A, MD  MAGNESIUM PO Take 500 mg by mouth every evening.   Yes [provider]  MELATONIN PO Take 10 mg by mouth at bedtime.   Yes [provider]  methocarbamol  (ROBAXIN ) 500 MG tablet Take 500-1,000 mg by mouth every 6 (six) hours as needed. 07/21/23  Yes [provider]  Multiple Vitamin (MULTIVITAMIN) tablet Take 1 tablet by mouth at bedtime.   Yes [provider]  Multiple Vitamins-Minerals (PRESERVISION AREDS 2 PO) Take 1 capsule by mouth 2 (two) times daily.   Yes [provider]  Secukinumab, 300 MG Dose, (COSENTYX, 300 MG DOSE,) 150 MG/ML SOSY Inject 300 mg as directed every 30 (thirty) days. 08/12/19  Yes [provider]  traMADol  (ULTRAM ) 50 MG tablet Take 50-100 mg by mouth 3 (three) times daily as needed. 07/21/23  Yes [provider]  TRELEGY ELLIPTA  100-62.5-25 MCG/ACT AEPB INHALE 1 PUFF INTO THE LUNGS DAILY 08/08/22  Yes Tamea Dedra CROME, MD  cyclobenzaprine  (FLEXERIL ) 10 MG tablet TAKE 0.5-1 TABLETS (5-10 MG TOTAL) BY MOUTH AT BEDTIME AS NEEDED.  08/09/23   Tower, Laine LABOR, MD     All other systems have been reviewed and were otherwise negative with the exception of those mentioned in the HPI and as above.  Physical Exam: There were no vitals filed for this visit.  There is no height or weight on file to calculate BMI.  General: Alert, no acute distress Cardiovascular: No pedal edema Respiratory: No cyanosis, no use of accessory musculature Skin: No lesions in the area of chief complaint Neurologic: Sensation intact distally Psychiatric: Patient is competent for consent with normal mood and affect Lymphatic: No axillary or cervical lymphadenopathy   Assessment/Plan: COMPRESSION FRACTURE, L4 Plan for Procedure(s): L4 KYPHOPLASTY   Oneil CROME Priestly, MD 08/16/2023 8:45 AM

## 2023-08-16 NOTE — Transfer of Care (Signed)
 Immediate Anesthesia Transfer of Care Note  Patient: Pamela Morales  Procedure(s) Performed: KYPHOPLASTY (Spine Lumbar)  Patient Location: PACU  Anesthesia Type:General  Level of Consciousness: awake, alert , and oriented  Airway & Oxygen Therapy: Patient Spontanous Breathing  Post-op Assessment: Report given to RN and Post -op Vital signs reviewed and stable  Post vital signs: Reviewed and stable  Last Vitals:  Vitals Value Taken Time  BP 164/98 08/16/23 15:52  Temp    Pulse 62 08/16/23 15:55  Resp 18 08/16/23 15:55  SpO2 95 % 08/16/23 15:55  Vitals shown include unfiled device data.  Last Pain:  Vitals:   08/16/23 1049  TempSrc:   PainSc: 5       Patients Stated Pain Goal: 0 (08/16/23 1049)  Complications: No notable events documented.

## 2023-08-16 NOTE — Anesthesia Procedure Notes (Signed)
 Procedure Name: Intubation Date/Time: 08/16/2023 2:48 PM  Performed by: Elby Raelene SAUNDERS, CRNAPre-anesthesia Checklist: Patient identified, Emergency Drugs available, Suction available and Patient being monitored Patient Re-evaluated:Patient Re-evaluated prior to induction Oxygen Delivery Method: Circle System Utilized Preoxygenation: Pre-oxygenation with 100% oxygen Induction Type: IV induction Ventilation: Mask ventilation without difficulty Laryngoscope Size: Glidescope and 3 Grade View: Grade I Tube type: Oral Tube size: 7.0 mm Number of attempts: 1 Airway Equipment and Method: Stylet Placement Confirmation: ETT inserted through vocal cords under direct vision, positive ETCO2 and breath sounds checked- equal and bilateral Secured at: 21 cm Tube secured with: Tape Dental Injury: Teeth and Oropharynx as per pre-operative assessment

## 2023-08-17 ENCOUNTER — Encounter (INDEPENDENT_AMBULATORY_CARE_PROVIDER_SITE_OTHER): Admitting: Ophthalmology

## 2023-08-17 ENCOUNTER — Encounter (HOSPITAL_COMMUNITY): Payer: Self-pay | Admitting: Orthopedic Surgery

## 2023-08-22 ENCOUNTER — Encounter (INDEPENDENT_AMBULATORY_CARE_PROVIDER_SITE_OTHER): Admitting: Ophthalmology

## 2023-08-22 DIAGNOSIS — I10 Essential (primary) hypertension: Secondary | ICD-10-CM | POA: Diagnosis not present

## 2023-08-22 DIAGNOSIS — H43813 Vitreous degeneration, bilateral: Secondary | ICD-10-CM | POA: Diagnosis not present

## 2023-08-22 DIAGNOSIS — H35033 Hypertensive retinopathy, bilateral: Secondary | ICD-10-CM | POA: Diagnosis not present

## 2023-08-22 DIAGNOSIS — H353132 Nonexudative age-related macular degeneration, bilateral, intermediate dry stage: Secondary | ICD-10-CM | POA: Diagnosis not present

## 2023-08-22 DIAGNOSIS — H34812 Central retinal vein occlusion, left eye, with macular edema: Secondary | ICD-10-CM

## 2023-08-26 ENCOUNTER — Other Ambulatory Visit: Payer: Self-pay | Admitting: Pulmonary Disease

## 2023-09-06 DIAGNOSIS — N1832 Chronic kidney disease, stage 3b: Secondary | ICD-10-CM | POA: Diagnosis not present

## 2023-09-07 DIAGNOSIS — Z86018 Personal history of other benign neoplasm: Secondary | ICD-10-CM | POA: Diagnosis not present

## 2023-09-07 DIAGNOSIS — Z808 Family history of malignant neoplasm of other organs or systems: Secondary | ICD-10-CM | POA: Diagnosis not present

## 2023-09-07 DIAGNOSIS — D2272 Melanocytic nevi of left lower limb, including hip: Secondary | ICD-10-CM | POA: Diagnosis not present

## 2023-09-07 DIAGNOSIS — L578 Other skin changes due to chronic exposure to nonionizing radiation: Secondary | ICD-10-CM | POA: Diagnosis not present

## 2023-09-07 DIAGNOSIS — D2261 Melanocytic nevi of right upper limb, including shoulder: Secondary | ICD-10-CM | POA: Diagnosis not present

## 2023-09-07 DIAGNOSIS — L920 Granuloma annulare: Secondary | ICD-10-CM | POA: Diagnosis not present

## 2023-09-07 DIAGNOSIS — D225 Melanocytic nevi of trunk: Secondary | ICD-10-CM | POA: Diagnosis not present

## 2023-09-07 DIAGNOSIS — Z85820 Personal history of malignant melanoma of skin: Secondary | ICD-10-CM | POA: Diagnosis not present

## 2023-09-07 DIAGNOSIS — L821 Other seborrheic keratosis: Secondary | ICD-10-CM | POA: Diagnosis not present

## 2023-09-07 DIAGNOSIS — Z85828 Personal history of other malignant neoplasm of skin: Secondary | ICD-10-CM | POA: Diagnosis not present

## 2023-09-07 DIAGNOSIS — D2262 Melanocytic nevi of left upper limb, including shoulder: Secondary | ICD-10-CM | POA: Diagnosis not present

## 2023-09-12 DIAGNOSIS — D638 Anemia in other chronic diseases classified elsewhere: Secondary | ICD-10-CM | POA: Diagnosis not present

## 2023-09-12 DIAGNOSIS — N1832 Chronic kidney disease, stage 3b: Secondary | ICD-10-CM | POA: Diagnosis not present

## 2023-09-12 DIAGNOSIS — M8580 Other specified disorders of bone density and structure, unspecified site: Secondary | ICD-10-CM | POA: Diagnosis not present

## 2023-09-12 DIAGNOSIS — I129 Hypertensive chronic kidney disease with stage 1 through stage 4 chronic kidney disease, or unspecified chronic kidney disease: Secondary | ICD-10-CM | POA: Diagnosis not present

## 2023-09-13 IMAGING — DX DG SHOULDER 2+V*L*
3 series · 3 of 3 positions shown · non-contrast
Comparison: 06/18/2020

CLINICAL DATA: Left shoulder pain after fall

EXAM:
LEFT SHOULDER - 2+ VIEW

[shoulder (grashey view) ap]
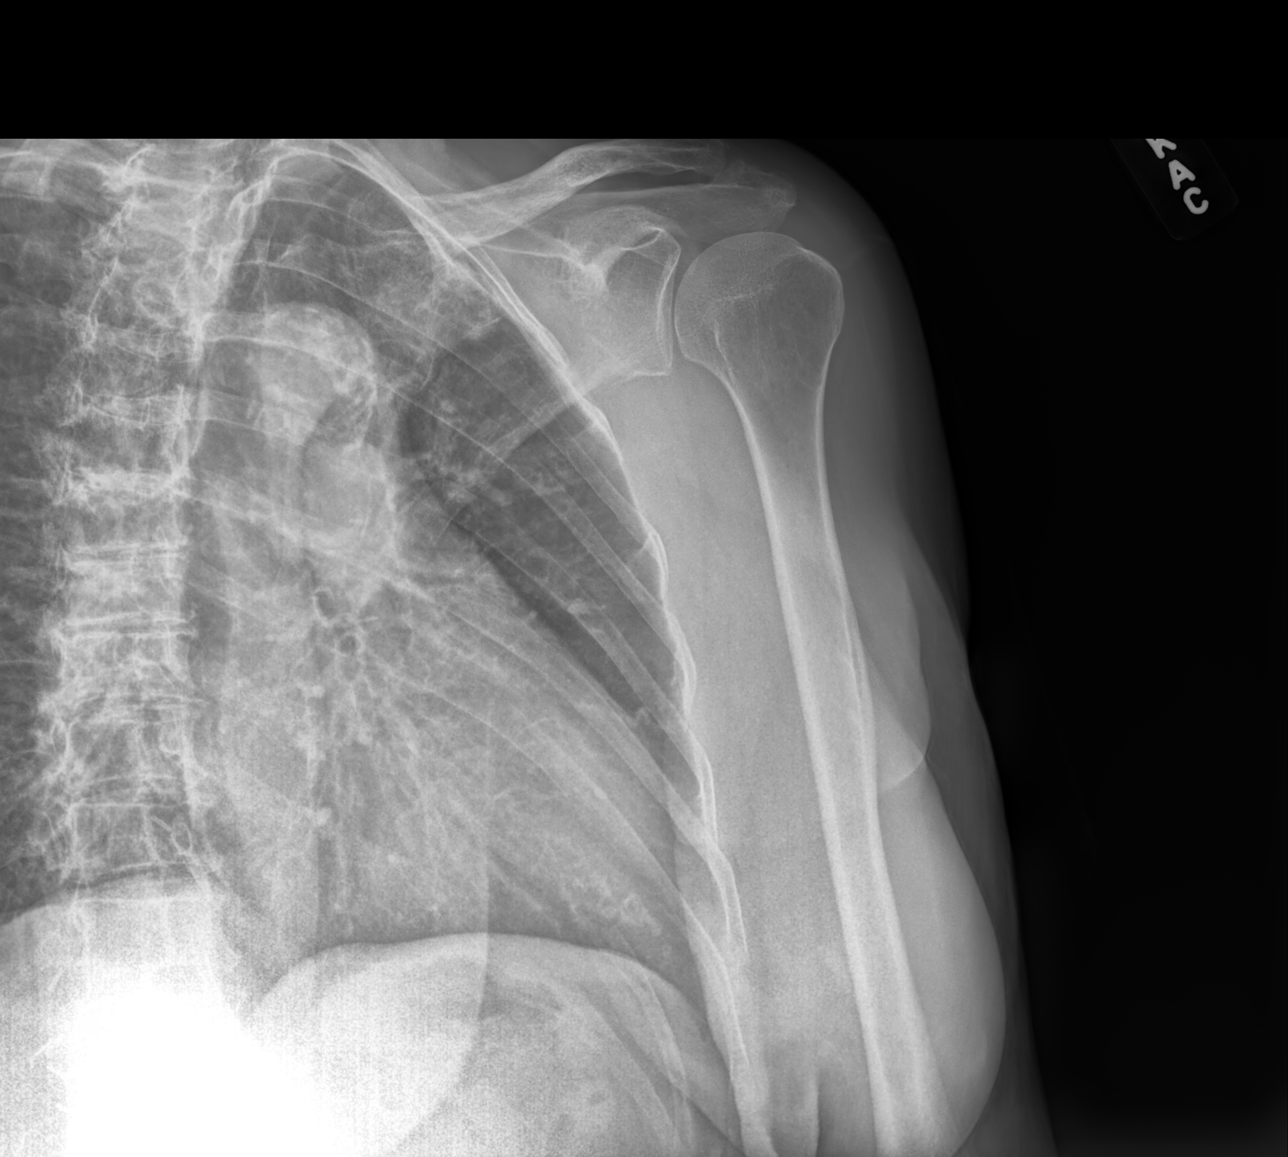

[shoulder (y view) lat]
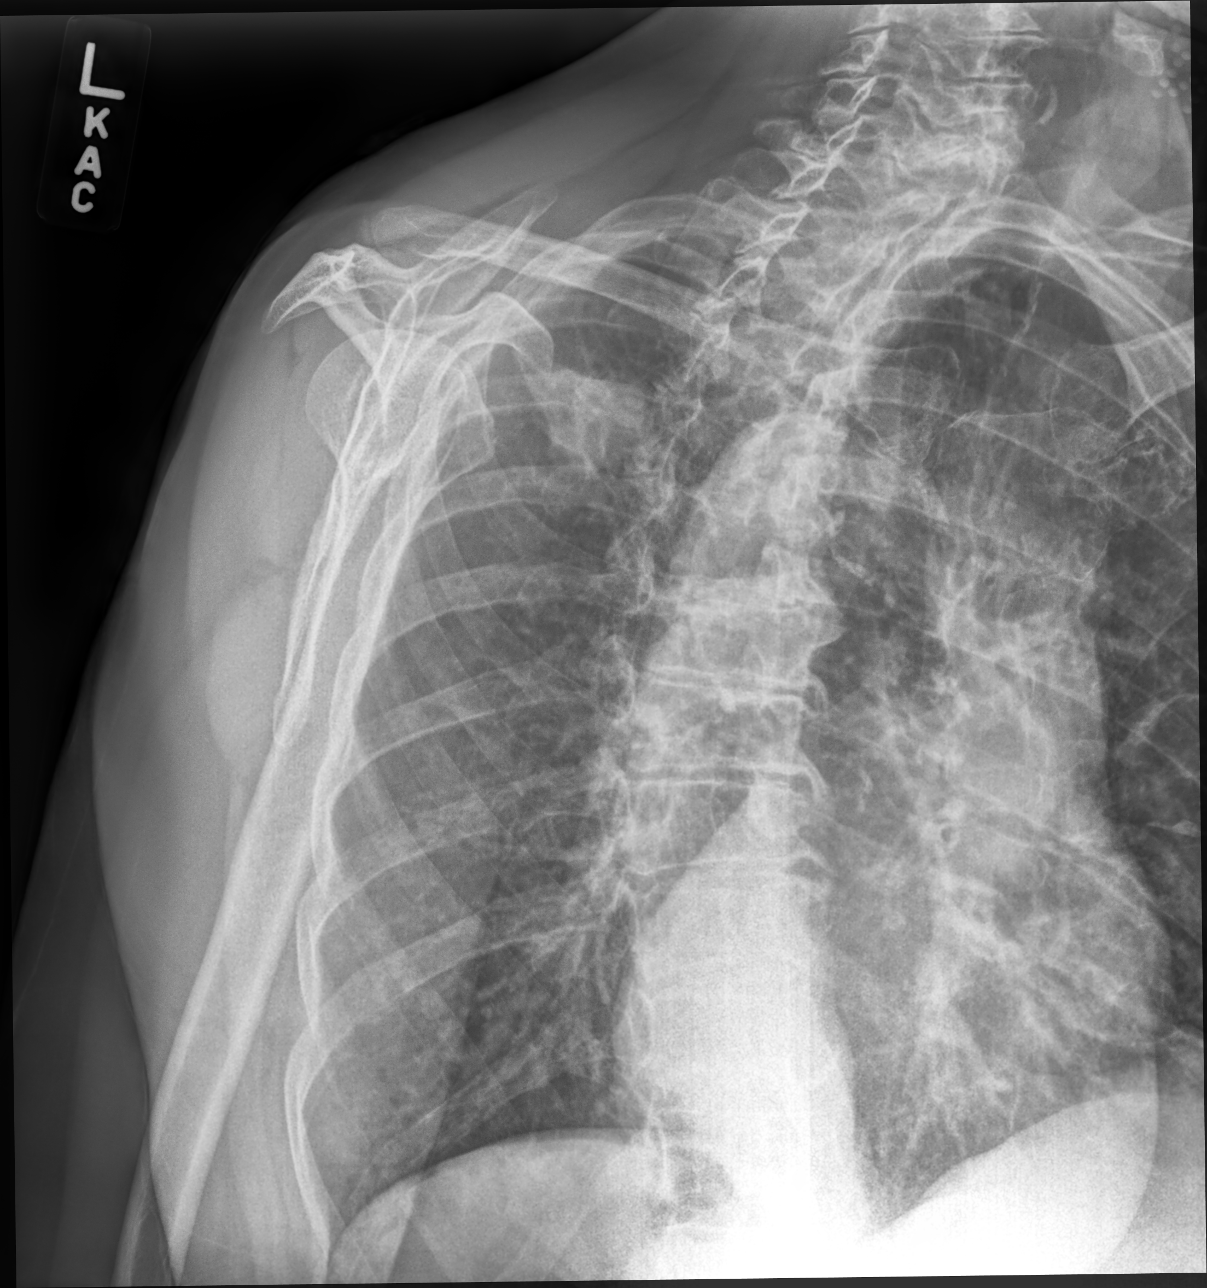

[shoulder axial]
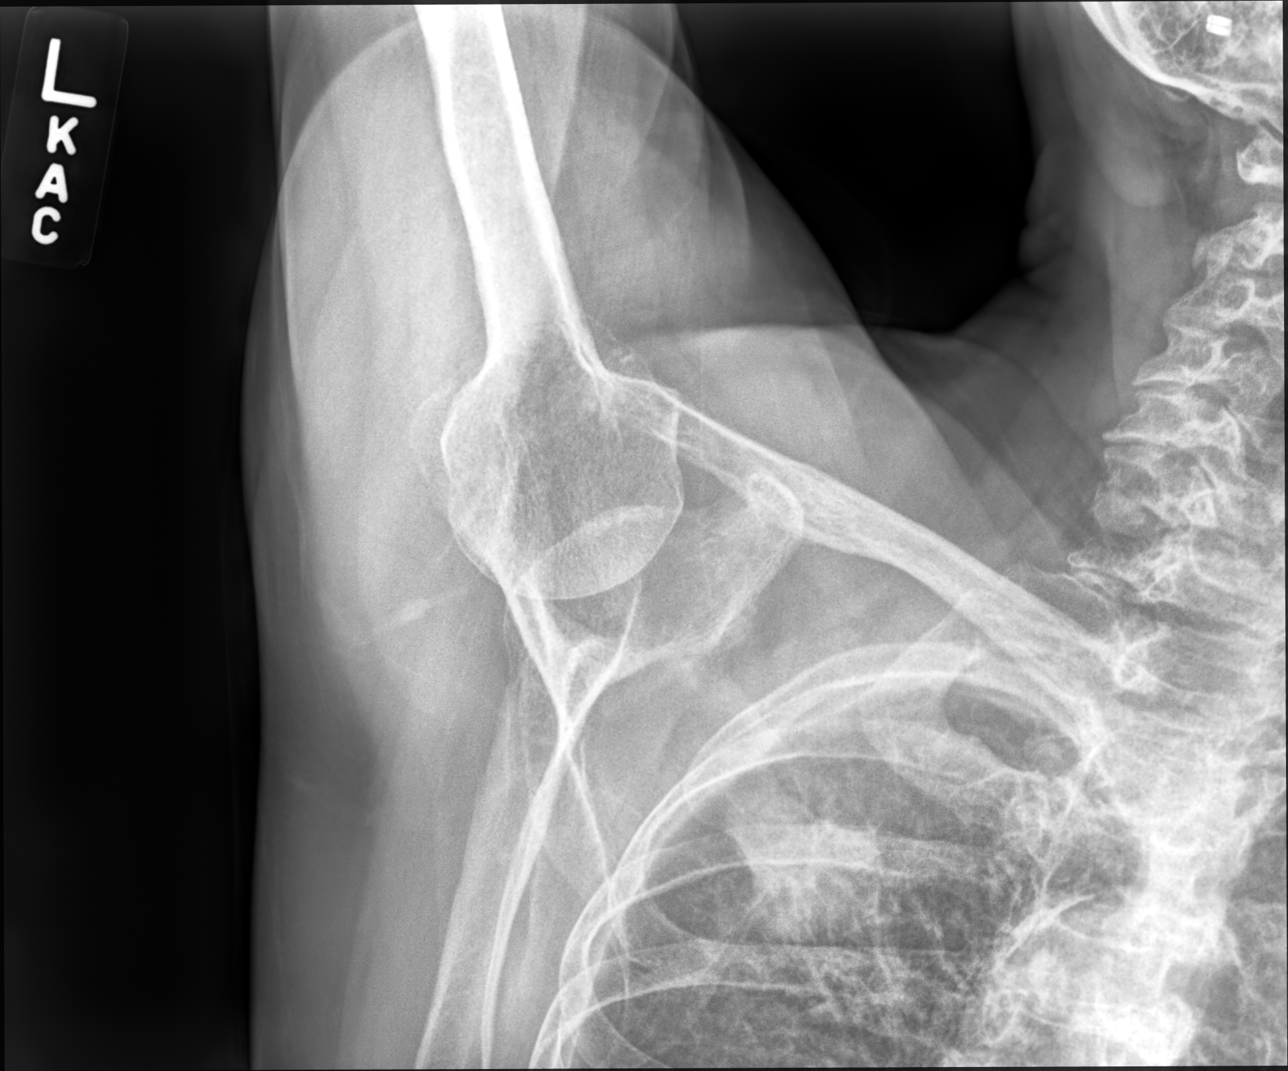

[3 of 3 positions shown; findings below may reference images not displayed]

FINDINGS: Bones are osteopenic. Minor AC joint degenerative change with bony
spurring and joint space loss. No acute left shoulder malalignment,
fracture or acute osseous finding. Included left chest demonstrates
aortic atherosclerosis. Degenerative changes noted throughout the
spine.
IMPRESSION: No acute abnormality.  Degenerative changes and osteopenia.

## 2023-09-20 ENCOUNTER — Other Ambulatory Visit: Payer: Self-pay | Admitting: Family Medicine

## 2023-09-20 DIAGNOSIS — H35013 Changes in retinal vascular appearance, bilateral: Secondary | ICD-10-CM | POA: Diagnosis not present

## 2023-09-20 DIAGNOSIS — H524 Presbyopia: Secondary | ICD-10-CM | POA: Diagnosis not present

## 2023-09-20 DIAGNOSIS — H40013 Open angle with borderline findings, low risk, bilateral: Secondary | ICD-10-CM | POA: Diagnosis not present

## 2023-09-20 DIAGNOSIS — H35371 Puckering of macula, right eye: Secondary | ICD-10-CM | POA: Diagnosis not present

## 2023-09-20 DIAGNOSIS — H353132 Nonexudative age-related macular degeneration, bilateral, intermediate dry stage: Secondary | ICD-10-CM | POA: Diagnosis not present

## 2023-09-21 ENCOUNTER — Ambulatory Visit (INDEPENDENT_AMBULATORY_CARE_PROVIDER_SITE_OTHER): Payer: HMO

## 2023-09-21 VITALS — BP 112/58 | Ht 60.0 in | Wt 129.6 lb

## 2023-09-21 DIAGNOSIS — Z0000 Encounter for general adult medical examination without abnormal findings: Secondary | ICD-10-CM

## 2023-09-21 NOTE — Patient Instructions (Signed)
 [name],  Thank you for taking the time for your Medicare Wellness Visit. I appreciate your continued commitment to your health goals. Please review the care plan we discussed, and feel free to reach out if I can assist you further.  Medicare recommends these wellness visits once per year to help you and your care team stay ahead of potential health issues. These visits are designed to focus on prevention, allowing your provider to concentrate on managing your acute and chronic conditions during your regular appointments.  Please note that Annual Wellness Visits do not include a physical exam. Some assessments may be limited, especially if the visit was conducted virtually. If needed, we may recommend a separate in-person follow-up with your provider.  Ongoing Care Seeing your primary care provider every 3 to 6 months helps us  monitor your health and provide consistent, personalized care.   Referrals If a referral was made during today's visit and you haven't received any updates within two weeks, please contact the referred provider directly to check on the status.  Recommended Screenings:  Health Maintenance  Topic Date Due   Flu Shot  08/04/2023   COVID-19 Vaccine (5 - 2025-26 season) 09/04/2023   Colon Cancer Screening  09/21/2023   Breast Cancer Screening  10/13/2023   Medicare Annual Wellness Visit  09/20/2024   DTaP/Tdap/Td vaccine (4 - Tdap) 12/09/2028   Pneumococcal Vaccine for age over 58  Completed   DEXA scan (bone density measurement)  Completed   Zoster (Shingles) Vaccine  Completed   HPV Vaccine  Aged Out   Meningitis B Vaccine  Aged Out       08/16/2023   10:50 AM  Advanced Directives  Does Patient Have a Medical Advance Directive? Yes  Type of Estate agent of [place];Living will  Does patient want to make changes to medical advance directive? No - Patient declined  Copy of Healthcare Power of Attorney in Chart? No - copy requested    Advance Care Planning is important because it: Ensures you receive medical care that aligns with your values, goals, and preferences. Provides guidance to your family and loved ones, reducing the emotional burden of decision-making during critical moments.  Vision: Annual vision screenings are recommended for early detection of glaucoma, cataracts, and diabetic retinopathy. These exams can also reveal signs of chronic conditions such as diabetes and high blood pressure.  Dental: Annual dental screenings help detect early signs of oral cancer, gum disease, and other conditions linked to overall health, including heart disease and diabetes.

## 2023-09-21 NOTE — Progress Notes (Signed)
 Subjective:   Pamela Morales is a 83 y.o. who presents for a Medicare Wellness preventive visit.  As a reminder, Annual Wellness Visits don't include a physical exam, and some assessments may be limited, especially if this visit is performed virtually. We may recommend an in-person follow-up visit with your provider if needed.  Visit Complete: In person  Persons Participating in Visit: Patient.  AWV Questionnaire: No: Patient Medicare AWV questionnaire was not completed prior to this visit.  Cardiac Risk Factors include: advanced age (>88men, >6 women);hypertension     Objective:    Today's Vitals   09/21/23 1348 09/21/23 1349  BP: (!) 112/58   Weight: 129 lb 9.6 oz (58.8 kg)   Height: 5' (1.524 m)   PainSc:  2    Body mass index is 25.31 kg/m.     09/21/2023    2:00 PM 08/16/2023   10:50 AM 08/09/2023    2:11 PM 06/12/2023   12:51 PM 12/05/2022   12:54 PM 09/13/2022    2:55 PM 08/16/2022    6:24 AM  Advanced Directives  Does Patient Have a Medical Advance Directive? Yes Yes Yes Yes Yes Yes Yes  Type of Estate agent of [place];Living will Healthcare Power of [place];Living will Healthcare Power of [place];Living will Healthcare Power of [place];Living will  Healthcare Power of [place];Living will Healthcare Power of [place];Living will  Does patient want to make changes to medical advance directive?  No - Patient declined       Copy of Healthcare Power of Attorney in Chart? No - copy requested No - copy requested No - copy requested No - copy requested  No - copy requested     Current Medications (verified) Outpatient Encounter Medications as of 09/21/2023  Medication Sig   acetaminophen  (TYLENOL ) 500 MG tablet Take 2 tablets (1,000 mg total) by mouth every 6 (six) hours as needed for mild pain.   albuterol  (VENTOLIN  HFA) 108 (90 Base) MCG/ACT inhaler Inhale 2 puffs into the lungs every 6 (six) hours as needed.   amLODipine  (NORVASC ) 10 MG tablet  TAKE 1 TABLET BY MOUTH EVERY DAY (Patient taking differently: Take 10 mg by mouth at bedtime.)   Calcium Carbonate-Vitamin D  (CALTRATE 600+D PO) Take 2 tablets by mouth every evening.   diphenhydrAMINE (BENADRYL) 50 MG tablet Take 50 mg by mouth at bedtime. JET-ASLEEP DOUBLE STRENGTH NIGHTTIME SLEEP AID   famotidine  (PEPCID ) 20 MG tablet TAKE 1 TABLET BY MOUTH TWICE A DAY   FLUoxetine  (PROZAC ) 20 MG capsule TAKE 3 CAPSULES BY MOUTH EVERY DAY (Patient taking differently: Take 40 mg by mouth in the morning.)   losartan  (COZAAR ) 50 MG tablet TAKE 1 TABLET BY MOUTH EVERY DAY   MAGNESIUM PO Take 500 mg by mouth every evening.   MELATONIN PO Take 10 mg by mouth at bedtime.   Multiple Vitamin (MULTIVITAMIN) tablet Take 1 tablet by mouth at bedtime.   Multiple Vitamins-Minerals (PRESERVISION AREDS 2 PO) Take 1 capsule by mouth 2 (two) times daily.   Secukinumab, 300 MG Dose, (COSENTYX, 300 MG DOSE,) 150 MG/ML SOSY Inject 300 mg as directed every 30 (thirty) days.   TRELEGY ELLIPTA  100-62.5-25 MCG/ACT AEPB INHALE 1 PUFF INTO THE LUNGS DAILY   cyclobenzaprine  (FLEXERIL ) 10 MG tablet TAKE 0.5-1 TABLETS (5-10 MG TOTAL) BY MOUTH AT BEDTIME AS NEEDED. (Patient not taking: Reported on 09/21/2023)   methocarbamol  (ROBAXIN ) 500 MG tablet Take 1-2 tablets (500-1,000 mg total) by mouth every 6 (six) hours as needed. (Patient  not taking: Reported on 09/21/2023)   traMADol  (ULTRAM ) 50 MG tablet Take 1-2 tablets (50-100 mg total) by mouth every 6 (six) hours as needed. (Patient not taking: Reported on 09/21/2023)   No facility-administered encounter medications on file as of 09/21/2023.    Allergies (verified) Sulfa antibiotics   History: Past Medical History:  Diagnosis Date   Allergy    Anemia    Aortic atherosclerosis (HCC)    Breast cancer (HCC) 2006?   C1 cervical fracture (HCC)    placed in c-collar-to f/u with neurosurgery on 06-21-21 in [place]   CA - cancer of ovary    Cancer (HCC) 02/10   R breast   (est rec neg and brac neg)   Cataract    FORMING    Chronic kidney disease    Complication of anesthesia    deviated septum-lost memory for about 1 week after anesthesia   COPD (chronic obstructive pulmonary disease) (HCC)    DDD (degenerative disc disease)    in back with chronic pain   Depression    Diverticulosis    DOE (dyspnea on exertion)    Eczema    Elevated serum creatinine    sees nephrology yearly   Elevated TSH    Emphysema of lung (HCC)    Family history of colon cancer    GERD (gastroesophageal reflux disease)    Hearing loss    Hypertension    Hypertriglyceridemia    Mass of left lung    Osteopenia    Psoriatic arthritis (HCC)    Rosacea    Skin cancer 2000?   Squamous cell carcinoma of left lung (HCC)    Thyroid  nodule    Ventral hernia    Zoster 02/2007   Past Surgical History:  Procedure Laterality Date   ABDOMINAL HYSTERECTOMY  1996   BSO fibroids, incidental ovarian CA finding   BASAL CELL CARCINOMA EXCISION  09/2016   BLADDER SUSPENSION     CATARACT EXTRACTION W/ INTRAOCULAR LENS IMPLANT Bilateral    CHOLECYSTECTOMY  08/1996   10 yrs ago   COLONOSCOPY     KYPHOPLASTY N/A 08/16/2023   Procedure: KYPHOPLASTY;  Surgeon: Beuford Anes, MD;  Location: [facility] OR;  Service: Orthopedics;  Laterality: N/A;   LIPOMA EXCISION Right    Abdomen   MASTECTOMY Right 2015   Lymph nodes removed   NASAL SEPTUM SURGERY     prior to 2000s   POLYPECTOMY     VIDEO BRONCHOSCOPY WITH ENDOBRONCHIAL ULTRASOUND Left 06/16/2021   Procedure: POSSIBLE VIDEO BRONCHOSCOPY WITH ENDOBRONCHIAL ULTRASOUND;  Surgeon: Tamea Dedra CROME, MD;  Location: [facility] ORS;  Service: Pulmonary;  Laterality: Left;   XI ROBOTIC ASSISTED VENTRAL HERNIA N/A 08/16/2022   Procedure: XI ROBOTIC ASSISTED VENTRAL HERNIA;  Surgeon: Desiderio Schanz, MD;  Location: [facility] ORS;  Service: General;  Laterality: N/A;   Family History  Problem Relation Age of Onset   Hypertension Mother    Heart disease Mother         CAD   Stomach cancer Mother 19   Cancer Mother    Lung cancer Father 73   Cancer Father    Early death Father    Cancer Sister        colon, skin   Heart disease Sister 66       MI   Diabetes Sister    Colon cancer Sister    Head & neck cancer Sister 30   Lung cancer Sister    Melanoma Sister  multiple on head   Heart disease Brother        HTN and MI   Hypertension Brother    Kidney disease Brother    Cancer Maternal Aunt        unk type   Cancer Paternal Aunt        unk type   Cancer Paternal Uncle        unk type   Colon cancer Daughter 31   Cancer Son    Cancer Sister    Hypertension Sister    Heart disease Daughter    Esophageal cancer Neg Hx    Colon polyps Neg Hx    Social History   Socioeconomic History   Marital status: Married    Spouse name: Not on file   Number of children: Not on file   Years of education: Not on file   Highest education level: Some college, no degree  Occupational History   Not on file  Tobacco Use   Smoking status: Former    Current packs/day: 0.00    Average packs/day: 2.0 packs/day for 10.0 years (20.0 ttl pk-yrs)    Types: Cigarettes    Start date: 01/04/1980    Quit date: 01/03/1990    Years since quitting: 33.7    Passive exposure: Past   Smokeless tobacco: Never  Vaping Use   Vaping status: Never Used  Substance and Sexual Activity   Alcohol use: Not Currently    Alcohol/week: 5.0 standard drinks of alcohol    Types: 5 Glasses of wine per week   Drug use: No   Sexual activity: Not Currently  Other Topics Concern   Not on file  Social History Narrative   Not on file   Social Drivers of Health   Financial Resource Strain: Low Risk  (09/21/2023)   Overall Financial Resource Strain (CARDIA)    Difficulty of Paying Living Expenses: Not hard at all  Food Insecurity: No Food Insecurity (09/21/2023)   Hunger Vital Sign    Worried About Running Out of Food in the Last Year: Never true    Ran Out of Food in  the Last Year: Never true  Transportation Needs: No Transportation Needs (09/21/2023)   PRAPARE - Administrator, Civil Service (Medical): No    Lack of Transportation (Non-Medical): No  Physical Activity: Sufficiently Active (09/21/2023)   Exercise Vital Sign    Days of Exercise per Week: 4 days    Minutes of Exercise per Session: 90 min  Stress: No Stress Concern Present (09/21/2023)   [employer] of Occupational Health - Occupational Stress Questionnaire    Feeling of Stress: Not at all  Social Connections: Socially Integrated (09/21/2023)   Social Connection and Isolation Panel    Frequency of Communication with Friends and Family: More than three times a week    Frequency of Social Gatherings with Friends and Family: More than three times a week    Attends Religious Services: More than 4 times per year    Active Member of [employer] or Organizations: Yes    Attends Engineer, structural: More than 4 times per year    Marital Status: Married    Tobacco Counseling Counseling given: Not Answered    Clinical Intake:  Pre-visit preparation completed: Yes  Pain : 0-10 Pain Score: 2  Pain Type: Chronic pain Pain Location: Back Pain Orientation: Lower Pain Descriptors / Indicators: Aching Pain Onset: More than a month ago Pain Frequency: Intermittent Pain Relieving Factors:  tylenol  Effect of Pain on Daily Activities: none  Pain Relieving Factors: tylenol   BMI - recorded: 25.31 Nutritional Status: BMI 25 -29 Overweight Nutritional Risks: None Diabetes: No  No results found for: HGBA1C   How often do you need to have someone help you when you read instructions, pamphlets, or other written materials from your doctor or pharmacy?: 1 - Never  Interpreter Needed?: No  Comments: lives with husband Information entered by :: B.Chanae Gemma,LPN   Activities of Daily Living     09/21/2023    2:00 PM 08/09/2023    2:12 PM  In your present state of health,  do you have any difficulty performing the following activities:  Hearing? 1   Vision? 0   Difficulty concentrating or making decisions? 0   Walking or climbing stairs? 0   Dressing or bathing? 0   Doing errands, shopping? 0 1  Comment  pt does not currently drive due to back issues  Preparing Food and eating ? N   Using the Toilet? N   In the past six months, have you accidently leaked urine? Y   Do you have problems with loss of bowel control? N   Managing your Medications? N   Managing your Finances? N   Housekeeping or managing your Housekeeping? N     Patient Care Team: Tower, Laine LABOR, MD as PCP - General (Family Medicine) Robinson Pao, MD as Consulting Physician (Dermatology) Paul Barrio, OD as Consulting Physician (Optometry) Gail Favorite, MD as Consulting Physician (General Surgery) Verdene Gills, RN as Oncology Nurse Navigator Jacobo, Evalene PARAS, MD as Consulting Physician (Oncology) Fate Morna SAILOR, [facility] (Inactive) as Pharmacist (Pharmacist) Tamea Dedra CROME, MD as Consulting Physician (Pulmonary Disease) Lenn Aran, MD as Consulting Physician (Radiation Oncology) Beuford Anes, MD as Consulting Physician (Orthopedic Surgery)  I have updated your Care Teams any recent Medical Services you may have received from other providers in the past year.     Assessment:   This is a routine wellness examination for Pamela Morales.  Hearing/Vision screen Hearing Screening - Comments:: Patient denies any hearing difficulties most of the time with hearing aids Vision Screening - Comments:: Pt says their vision is good with glasses [facility]   Goals Addressed             This Visit's Progress    COMPLETED: Increase physical activity       Starting 10/12/2016, I will continue to exercise for 60-90 min 6-7 days per week.      COMPLETED: Patient Stated       09/21/2021, wants to get foot healed     COMPLETED: Patient Stated       Lose 10 pounds        Depression Screen     09/21/2023    1:57 PM 08/01/2023   11:17 AM 07/19/2023    2:03 PM 07/12/2023   11:32 AM 09/13/2022    2:50 PM 07/29/2022    2:07 PM 09/21/2021    2:04 PM  PHQ 2/9 Scores  PHQ - 2 Score 0 0 0 0 0 0 0  PHQ- 9 Score  0 0 0 0 0     Fall Risk     09/21/2023    1:51 PM 08/01/2023   11:16 AM 07/19/2023    2:03 PM 07/12/2023   11:32 AM 09/13/2022    2:58 PM  Fall Risk   Falls in the past year? 1 1 1 1  0  Number falls in past  yr: 0 0 0 0 0  Injury with Fall? 0 1 1 1  0  Risk for fall due to : No Fall Risks History of fall(s) History of fall(s) History of fall(s) No Fall Risks  Follow up Falls prevention discussed;Education provided Falls evaluation completed Falls evaluation completed Falls evaluation completed Falls prevention discussed;Falls evaluation completed    MEDICARE RISK AT HOME:  Medicare Risk at Home Any stairs in or around the home?: Yes (2 steps) If so, are there any without handrails?: Yes Home free of loose throw rugs in walkways, pet beds, electrical cords, etc?: Yes Adequate lighting in your home to reduce risk of falls?: Yes Life alert?: No Use of a cane, walker or w/c?: No Grab bars in the bathroom?: Yes Shower chair or bench in shower?: Yes Elevated toilet seat or a handicapped toilet?: Yes  TIMED UP AND GO:  Was the test performed?  Yes  Length of time to ambulate 10 feet: 10 sec Gait steady and fast without use of assistive device  Cognitive Function: 6CIT completed    10/12/2016    3:15 PM 03/24/2015    2:15 PM  MMSE - Mini Mental State Exam  Orientation to time 5  5   Orientation to Place 5  5   Registration 3  3   Attention/ Calculation 0  5   Recall 3  1   Language- name 2 objects 0  0   Language- repeat 1 1  Language- follow 3 step command 3  3   Language- read & follow direction 0  1   Write a sentence 0  0   Copy design 0  0   Total score 20  24      Data saved with a previous flowsheet row definition         09/21/2023    2:06 PM 09/13/2022    3:00 PM 09/21/2021    2:07 PM 09/19/2020   11:06 AM  6CIT Screen  What Year? 0 points 0 points 0 points 0 points  What month? 0 points 0 points 0 points 0 points  What time? 0 points 0 points 0 points 0 points  Count back from 20 0 points 0 points 0 points 0 points  Months in reverse 0 points 0 points 0 points 0 points  Repeat phrase 0 points 0 points 0 points   Total Score 0 points 0 points 0 points     Immunizations Immunization History  Administered Date(s) Administered   Fluad Quad(high Dose 65+) 01/01/2020   INFLUENZA, HIGH DOSE SEASONAL PF 10/12/2016, 10/30/2017, 10/04/2018, 01/01/2020, 09/28/2021   Influenza Split 10/11/2011   Influenza Whole 10/04/2006   Influenza,inj,Quad PF,6+ Mos 09/19/2012, 10/31/2013, 10/10/2014   Influenza-Unspecified 09/18/2015   Moderna Sars-Covid-2 Vaccination 01/29/2019, 02/26/2019, 11/23/2019   Pfizer(Comirnaty)Fall Seasonal Vaccine 12 years and older 10/24/2021   Pneumococcal Conjugate-13 04/28/2014   Pneumococcal Polysaccharide-23 08/03/2012   Respiratory Syncytial Virus Vaccine,Recomb Aduvanted(Arexvy) 01/19/2022   Td 01/03/1994, 07/17/2008, 12/10/2018   Zoster Recombinant(Shingrix) 03/25/2021, 08/23/2021    Screening Tests Health Maintenance  Topic Date Due   Influenza Vaccine  08/04/2023   COVID-19 Vaccine (5 - 2025-26 season) 09/04/2023   Colonoscopy  09/21/2023   Mammogram  10/13/2023   Medicare Annual Wellness (AWV)  09/20/2024   DTaP/Tdap/Td (4 - Tdap) 12/09/2028   Pneumococcal Vaccine: 50+ Years  Completed   DEXA SCAN  Completed   Zoster Vaccines- Shingrix  Completed   HPV VACCINES  Aged Out   Meningococcal B  Vaccine  Aged Out    Health Maintenance Items Addressed: None due at this time. Pt will receive vaccines at their pharmcy when decided to obtain   Additional Screening:  Vision Screening: Recommended annual ophthalmology exams for early detection of glaucoma and other disorders of  the eye. Is the patient up to date with their annual eye exam?  Yes  Who is the provider or what is the name of the office in which the patient attends annual eye exams? Dr Paul  Dental Screening: Recommended annual dental exams for proper oral hygiene  Community Resource Referral / Chronic Care Management: CRR required this visit?  No   CCM required this visit?  Appt scheduled with PCP   Plan:    I have personally reviewed and noted the following in the patient's chart:   Medical and social history Use of alcohol, tobacco or illicit drugs  Current medications and supplements including opioid prescriptions. Patient is not currently taking opioid prescriptions. Functional ability and status Nutritional status Physical activity Advanced directives List of other physicians Hospitalizations, surgeries, and ER visits in previous 12 months Vitals Screenings to include cognitive, depression, and falls Referrals and appointments  In addition, I have reviewed and discussed with patient certain preventive protocols, quality metrics, and best practice recommendations. A written personalized care plan for preventive services as well as general preventive health recommendations were provided to patient.   Erminio LITTIE Saris, LPN   [id number]   After Visit Summary: (MyChart) Due to this being a telephonic visit, the after visit summary with patients personalized plan was offered to patient via MyChart   Notes: Nothing significant to report at this time.

## 2023-10-03 ENCOUNTER — Encounter: Payer: Self-pay | Admitting: Family Medicine

## 2023-10-03 ENCOUNTER — Ambulatory Visit (INDEPENDENT_AMBULATORY_CARE_PROVIDER_SITE_OTHER): Admitting: Family Medicine

## 2023-10-03 VITALS — BP 102/64 | HR 58 | Temp 98.0°F | Ht 60.0 in | Wt 130.5 lb

## 2023-10-03 DIAGNOSIS — Z1231 Encounter for screening mammogram for malignant neoplasm of breast: Secondary | ICD-10-CM

## 2023-10-03 DIAGNOSIS — Z23 Encounter for immunization: Secondary | ICD-10-CM | POA: Diagnosis not present

## 2023-10-03 DIAGNOSIS — S32040D Wedge compression fracture of fourth lumbar vertebra, subsequent encounter for fracture with routine healing: Secondary | ICD-10-CM

## 2023-10-03 DIAGNOSIS — M8580 Other specified disorders of bone density and structure, unspecified site: Secondary | ICD-10-CM

## 2023-10-03 DIAGNOSIS — E2839 Other primary ovarian failure: Secondary | ICD-10-CM

## 2023-10-03 NOTE — Assessment & Plan Note (Signed)
 Dexa ordered

## 2023-10-03 NOTE — Progress Notes (Signed)
 Subjective:    Patient ID: Pamela Morales, female    DOB: [birth year], 83 y.o.   MRN: [id number]  HPI  Wt Readings from Last 3 Encounters:  10/03/23 130 lb 8 oz (59.2 kg)  09/21/23 129 lb 9.6 oz (58.8 kg)  08/16/23 129 lb 8 oz (58.7 kg)   25.49 kg/m  Vitals:   10/03/23 1120  BP: 102/64  Pulse: (!) 58  Temp: 98 F (36.7 C)  SpO2: 96%    Pt presents for flu shot Follow up osteopenia    History of comp fractures of L4 in past  Kyphoplasty  Went very well   A little muscle pain but otherwise doing better  Is back to the gym - happy to get there    Last dexa 05/2021  [facility]  TS -2.0 in spine   Alendronate  in past   Supplements ca and D    Sees nephrology for ckd  Lab Results  Component Value Date   NA 137 07/25/2023   K 4.5 07/25/2023   CO2 27 07/25/2023   GLUCOSE 98 07/25/2023   BUN 27 (H) 07/25/2023   CREATININE 1.46 (H) 07/25/2023   CALCIUM 9.2 07/25/2023   GFR 32.91 (L) 07/25/2023   EGFR 29.0 07/18/2023   GFRNONAA 41 (L) 08/08/2022   Lab Results  Component Value Date   CALCIUM 9.2 07/25/2023   PHOS 4.5 07/17/2008   PTH-not abn with nephrology in past    GFR 32.91 in July  Ca level normal   Last vitamin D  Lab Results  Component Value Date   VD25OH 44.99 07/25/2023   Sees rheum for psoriatic arthritis    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 04/06/2022   Pain of left scapula 04/06/2022   Primary localized osteoarthrosis of multiple sites 08/11/2021   Other long term (current) drug therapy 08/11/2021   Genetic testing 07/07/2021   Squamous cell carcinoma of left lung (HCC) 06/18/2021   Personal history of ovarian cancer 06/15/2021   Family history of  melanoma 06/15/2021   Family history of stomach cancer 06/15/2021   Mass of left lung 05/14/2021   CRI (chronic renal insufficiency) 10/23/2020   Left foot pain 01/27/2020   High triglycerides 07/16/2019   COPD (chronic obstructive pulmonary disease) (HCC) 07/12/2018   Elevated TSH 10/18/2016   Elevated serum creatinine 10/18/2016   Estrogen deficiency 03/23/2016   Encounter for screening mammogram for breast cancer 03/23/2016   Psoriatic arthritis (HCC) 03/31/2015   Routine general medical examination at a health care facility 03/25/2015   Fatigue 04/28/2014   Family history of colon cancer 01/01/2013   History of colonic polyps 01/01/2013   Anemia 12/19/2012   Hearing loss 09/19/2012   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   BACK PAIN WITH RADICULOPATHY 03/15/2007   History of herpes zoster 02/23/2007   Essential hypertension 02/05/2007   Osteopenia 02/05/2007   ECZEMA, ATOPIC 01/29/2007   ROSACEA 01/29/2007   BASAL CELL CARCINOMA, HX OF 01/29/2007   Past Medical History:  Diagnosis Date   Allergy    Anemia    Aortic atherosclerosis    Breast cancer (HCC) 2006?   C1 cervical fracture (HCC)    placed in c-collar-to f/u with  neurosurgery on 06-21-21 in [place]   Philip - cancer of ovary    Cancer (HCC) 02/10   R breast  (est rec neg and brac neg)   Cataract    FORMING    Chronic kidney disease    Complication of anesthesia    deviated septum-lost memory for about 1 week after anesthesia   COPD (chronic obstructive pulmonary disease) (HCC)    DDD (degenerative disc disease)    in back with chronic pain   Depression    Diverticulosis    DOE (dyspnea on exertion)    Eczema    Elevated serum creatinine    sees nephrology yearly   Elevated TSH    Emphysema of lung (HCC)    Family history of colon cancer    GERD (gastroesophageal reflux disease)    Hearing loss    Hypertension    Hypertriglyceridemia    Mass of left lung    Osteopenia    Psoriatic  arthritis (HCC)    Rosacea    Skin cancer 2000?   Squamous cell carcinoma of left lung (HCC)    Thyroid  nodule    Ventral hernia    Zoster 02/2007   Past Surgical History:  Procedure Laterality Date   ABDOMINAL HYSTERECTOMY  1996   BSO fibroids, incidental ovarian CA finding   BASAL CELL CARCINOMA EXCISION  09/2016   BLADDER SUSPENSION     CATARACT EXTRACTION W/ INTRAOCULAR LENS IMPLANT Bilateral    CHOLECYSTECTOMY  08/1996   10 yrs ago   COLONOSCOPY     KYPHOPLASTY N/A 08/16/2023   Procedure: KYPHOPLASTY;  Surgeon: Beuford Anes, MD;  Location: [facility] OR;  Service: Orthopedics;  Laterality: N/A;   LIPOMA EXCISION Right    Abdomen   MASTECTOMY Right 2015   Lymph nodes removed   NASAL SEPTUM SURGERY     prior to 2000s   POLYPECTOMY     VIDEO BRONCHOSCOPY WITH ENDOBRONCHIAL ULTRASOUND Left 06/16/2021   Procedure: POSSIBLE VIDEO BRONCHOSCOPY WITH ENDOBRONCHIAL ULTRASOUND;  Surgeon: Tamea Dedra CROME, MD;  Location: [facility] ORS;  Service: Pulmonary;  Laterality: Left;   XI ROBOTIC ASSISTED VENTRAL HERNIA N/A 08/16/2022   Procedure: XI ROBOTIC ASSISTED VENTRAL HERNIA;  Surgeon: Desiderio Schanz, MD;  Location: [facility] ORS;  Service: General;  Laterality: N/A;   Social History   Tobacco Use   Smoking status: Former    Current packs/day: 0.00    Average packs/day: 2.0 packs/day for 10.0 years (20.0 ttl pk-yrs)    Types: Cigarettes    Start date: 01/04/1980    Quit date: 01/03/1990    Years since quitting: 33.7    Passive exposure: Past   Smokeless tobacco: Never  Vaping Use   Vaping status: Never Used  Substance Use Topics   Alcohol use: Not Currently    Alcohol/week: 5.0 standard drinks of alcohol    Types: 5 Glasses of wine per week   Drug use: No   Family History  Problem Relation Age of Onset   Hypertension Mother    Heart disease Mother        CAD   Stomach cancer Mother 47   Cancer Mother    Lung cancer Father 39   Cancer Father    Early death Father    Cancer Sister         colon, skin   Heart disease Sister 18       MI   Diabetes Sister    Colon cancer Sister    Head &  neck cancer Sister 100   Lung cancer Sister    Melanoma Sister        multiple on head   Heart disease Brother        HTN and MI   Hypertension Brother    Kidney disease Brother    Cancer Maternal Aunt        unk type   Cancer Paternal Aunt        unk type   Cancer Paternal Uncle        unk type   Colon cancer Daughter 1   Cancer Son    Cancer Sister    Hypertension Sister    Heart disease Daughter    Esophageal cancer Neg Hx    Colon polyps Neg Hx    Allergies  Allergen Reactions   Sulfa Antibiotics     Pt unsure of reaction   Current Outpatient Medications on File Prior to Visit  Medication Sig Dispense Refill   acetaminophen  (TYLENOL ) 500 MG tablet Take 2 tablets (1,000 mg total) by mouth every 6 (six) hours as needed for mild pain.     albuterol  (VENTOLIN  HFA) 108 (90 Base) MCG/ACT inhaler Inhale 2 puffs into the lungs every 6 (six) hours as needed. 8 g 2   amLODipine  (NORVASC ) 10 MG tablet TAKE 1 TABLET BY MOUTH EVERY DAY (Patient taking differently: Take 10 mg by mouth at bedtime.) 90 tablet 0   Calcium Carbonate-Vitamin D  (CALTRATE 600+D PO) Take 2 tablets by mouth every evening.     Cholecalciferol (VITAMIN D3) 1000 units CAPS Take 1 capsule by mouth daily.     cyclobenzaprine  (FLEXERIL ) 10 MG tablet TAKE 0.5-1 TABLETS (5-10 MG TOTAL) BY MOUTH AT BEDTIME AS NEEDED. 15 tablet 0   diphenhydrAMINE (BENADRYL) 50 MG tablet Take 50 mg by mouth at bedtime. JET-ASLEEP DOUBLE STRENGTH NIGHTTIME SLEEP AID     famotidine  (PEPCID ) 20 MG tablet TAKE 1 TABLET BY MOUTH TWICE A DAY 180 tablet 1   FLUoxetine  (PROZAC ) 20 MG capsule TAKE 3 CAPSULES BY MOUTH EVERY DAY (Patient taking differently: Take 40 mg by mouth in the morning.) 270 capsule 0   losartan  (COZAAR ) 50 MG tablet TAKE 1 TABLET BY MOUTH EVERY DAY 90 tablet 2   MAGNESIUM PO Take 500 mg by mouth every evening.      MELATONIN PO Take 10 mg by mouth at bedtime.     methocarbamol  (ROBAXIN ) 500 MG tablet Take 1-2 tablets (500-1,000 mg total) by mouth every 6 (six) hours as needed. 60 tablet 0   Multiple Vitamin (MULTIVITAMIN) tablet Take 1 tablet by mouth at bedtime.     Multiple Vitamins-Minerals (PRESERVISION AREDS 2 PO) Take 1 capsule by mouth 2 (two) times daily.     Secukinumab, 300 MG Dose, (COSENTYX, 300 MG DOSE,) 150 MG/ML SOSY Inject 300 mg as directed every 30 (thirty) days.     TRELEGY ELLIPTA  100-62.5-25 MCG/ACT AEPB INHALE 1 PUFF INTO THE LUNGS DAILY 60 each 11   No current facility-administered medications on file prior to visit.    Review of Systems  Constitutional:  Negative for activity change, appetite change, fatigue, fever and unexpected weight change.  HENT:  Negative for congestion, ear pain, rhinorrhea, sinus pressure and sore throat.   Eyes:  Negative for pain, redness and visual disturbance.  Respiratory:  Negative for cough, shortness of breath and wheezing.   Cardiovascular:  Negative for chest pain and palpitations.  Gastrointestinal:  Negative for abdominal pain, blood in stool, constipation and  diarrhea.  Endocrine: Negative for polydipsia and polyuria.  Genitourinary:  Negative for dysuria, frequency and urgency.  Musculoskeletal:  Negative for arthralgias, back pain and myalgias.       Back pain is much improved   Skin:  Negative for pallor and rash.  Allergic/Immunologic: Negative for environmental allergies.  Neurological:  Negative for dizziness, syncope and headaches.  Hematological:  Negative for adenopathy. Does not bruise/bleed easily.  Psychiatric/Behavioral:  Negative for decreased concentration and dysphoric mood. The patient is not nervous/anxious.        Objective:   Physical Exam Constitutional:      General: She is not in acute distress.    Appearance: Normal appearance. She is well-developed. She is not ill-appearing or diaphoretic.  HENT:     Head:  Normocephalic and atraumatic.  Eyes:     Conjunctiva/sclera: Conjunctivae normal.     Pupils: Pupils are equal, round, and reactive to light.  Neck:     Thyroid : No thyromegaly.     Vascular: No carotid bruit or JVD.  Cardiovascular:     Rate and Rhythm: Regular rhythm. Bradycardia present.  Pulmonary:     Effort: No respiratory distress.  Abdominal:     General: There is no distension or abdominal bruit.     Palpations: Abdomen is soft.  Musculoskeletal:     Cervical back: Normal range of motion and neck supple.     Right lower leg: No edema.     Left lower leg: No edema.     Comments: Slight rounded shoulders / borderline kyphosis  Petite frame   Lymphadenopathy:     Cervical: No cervical adenopathy.  Skin:    General: Skin is warm and dry.     Coloration: Skin is not pale.     Findings: No rash.  Neurological:     Mental Status: She is alert.     Coordination: Coordination normal.     Deep Tendon Reflexes: Reflexes are normal and symmetric.  Psychiatric:        Mood and Affect: Mood normal.           Assessment & Plan:   Problem List Items Addressed This Visit       Musculoskeletal and Integument   Osteopenia - Primary   With spinal comp fracture this year Due for 2 y dexa -ordered at [facility]  Also referred to sport med for osteoporosis treatment discussion  Due to CKD -bisphosphenate may not be option (took alendronate  in past)   On ca and D Excellent exercise- gym   Fall precautions noted  Info on bone health given      Relevant Orders   Amb Referral to Osteoporosis Management    Closed compression fracture of L4 lumbar vertebra, with routine healing, subsequent encounter   Much clinical improvement after kyphoplasty  Is back to the gym          Other   Estrogen deficiency   Dexa ordered       Relevant Orders   DG Bone Density   Encounter for screening mammogram for breast cancer   Mammogram ordered Pt will call to schedule at [facility]       Relevant Orders   MM 3D SCREENING MAMMOGRAM BILATERAL BREAST   Other Visit Diagnoses       Need for influenza vaccination       Relevant Orders   Flu vaccine HIGH DOSE PF(Fluzone Trivalent) (Completed)

## 2023-10-03 NOTE — Assessment & Plan Note (Signed)
 Much clinical improvement after kyphoplasty  Is back to the gym

## 2023-10-03 NOTE — Patient Instructions (Addendum)
 Once your bone density test is done we can have you see sport med for osteoporosis management  I put the referral in for sport med  Please let us  know if you don't hear in 1-2 weeks to set that up (mychart message or call or letter)    Keep taking your calcium and vitamin D  Keep up the great exercise !!    You have an order for:  [x]   3D Mammogram  [x]   Bone Density     Please call for appointment:   []   [facility]  [address] [place] [address]  [phone]  []   [facility]   [address]  [place], [place] [address]  [phone]  []   [facility]      [address] [place], [place]        [phone]         [x]   [facility]  [address] [place], [place]  [phone]  []  [facility]   [address]   [place], [place] [address]  [phone]  []  [facility]  [address] [place], [place] [address] [phone]    Make sure to wear two piece clothing  No lotions powders or deodorants the day of the appointment Make sure to bring picture ID and insurance card.  Bring list of medications you are currently taking including any supplements.   Schedule your screening mammogram through MyChart!   Select [place] imaging sites can now be scheduled through MyChart.  Log into your MyChart account.  Go to 'Visit' (or 'Appointments' if  on mobile App) --> Schedule an  Appointment  Under 'Select a Reason for Visit' choose the Mammogram  Screening option.  Complete the pre-visit questions  and select the time and place that  best fits your schedule

## 2023-10-03 NOTE — Assessment & Plan Note (Signed)
 With spinal comp fracture this year Due for 2 y dexa -ordered at [facility]  Also referred to sport med for osteoporosis treatment discussion  Due to CKD -bisphosphenate may not be option (took alendronate  in past)   On ca and D Excellent exercise- gym   Fall precautions noted  Info on bone health given

## 2023-10-03 NOTE — Assessment & Plan Note (Signed)
Mammogram ordered Pt will call to schedule at [facility]

## 2023-11-01 DIAGNOSIS — M810 Age-related osteoporosis without current pathological fracture: Secondary | ICD-10-CM | POA: Diagnosis not present

## 2023-11-01 DIAGNOSIS — Z1231 Encounter for screening mammogram for malignant neoplasm of breast: Secondary | ICD-10-CM | POA: Diagnosis not present

## 2023-11-01 DIAGNOSIS — M85831 Other specified disorders of bone density and structure, right forearm: Secondary | ICD-10-CM | POA: Diagnosis not present

## 2023-11-01 LAB — HM DEXA SCAN

## 2023-11-02 ENCOUNTER — Ambulatory Visit: Payer: Self-pay | Admitting: Family Medicine

## 2023-11-02 ENCOUNTER — Encounter: Payer: Self-pay | Admitting: Family Medicine

## 2023-11-28 ENCOUNTER — Ambulatory Visit: Admitting: Family Medicine

## 2023-12-04 ENCOUNTER — Ambulatory Visit: Admitting: Family Medicine

## 2023-12-04 ENCOUNTER — Encounter: Payer: Self-pay | Admitting: Family Medicine

## 2023-12-04 VITALS — BP 123/50 | Ht 60.0 in | Wt 125.0 lb

## 2023-12-04 DIAGNOSIS — Z853 Personal history of malignant neoplasm of breast: Secondary | ICD-10-CM | POA: Diagnosis not present

## 2023-12-04 DIAGNOSIS — N2889 Other specified disorders of kidney and ureter: Secondary | ICD-10-CM

## 2023-12-04 DIAGNOSIS — Z85118 Personal history of other malignant neoplasm of bronchus and lung: Secondary | ICD-10-CM | POA: Diagnosis not present

## 2023-12-04 DIAGNOSIS — Z8781 Personal history of (healed) traumatic fracture: Secondary | ICD-10-CM | POA: Diagnosis not present

## 2023-12-04 DIAGNOSIS — M810 Age-related osteoporosis without current pathological fracture: Secondary | ICD-10-CM | POA: Diagnosis not present

## 2023-12-04 DIAGNOSIS — Z8543 Personal history of malignant neoplasm of ovary: Secondary | ICD-10-CM

## 2023-12-04 NOTE — Patient Instructions (Signed)
 Osteoporosis Work-up and Treatment  Labwork to obtain: PTH, SPEP   Supplementation: Make sure you're getting 1200mg  calcium and 800 IU of vitamin D  daily It would be ok to start taking Vitamin K 100 micrograms daily   Exercise: Strength/resistance-based exercise (i.e. free weights, lifting); weight-based (yoga, tai-chi, pilates)  Medication Options: Evenity - this builds bone, is an injection once a month for 12 months - after you finish this you would go on a medicine that keeps it built up (Prolia).  If not approved, you may be a candidate for Tymlos or Forteo (these are daily injections that you take for 2 weeks that help build bone like Evenity, then you would transtion to a medication like Prolia Prolia - injection that just maintains bone - given every 6 months here in the office

## 2023-12-04 NOTE — Progress Notes (Signed)
 DATE OF VISIT: 12/04/2023        Pamela Morales DOB: Jan 12, 1940 MRN: [id number]  Discussed the use of AI scribe software for clinical note transcription with the patient, who gave verbal consent to proceed.  History of Present Illness Pamela Morales is an 83 year old female with osteoporosis who presents for evaluation of her condition. She was referred by Dr. Randeen for evaluation of her osteoporosis.  Osteoporosis and fracture history - Osteoporosis diagnosed by bone density scan 11/01/23; recent results communicated by referring provider - Compression fracture L4 sustained after a fall in summer, treated with kyphoplasty - Cervical fracture over a year ago after a fall at her son's house - No history of hip or wrist fractures - Previously treated with alendronate  for osteopenia, discontinued due to possible dental issues and chronic kidney disease  Oncologic history - History of breast, lung, and ovarian cancer, all treated with radiation therapy - No chemotherapy administered due to early detection - Hysterectomy performed in her fifties - Currently in remission with regular follow-up - CT scan of the lung scheduled for next Tuesday  Renal function and laboratory findings - labs completed with PCP 07/25/23, all reviewed during visit today - Chronic kidney disease with CMP showing BUN 27, creatinine 1.46, GFR 32.91, normal LFTs, normal calcium 9.2 - Thyroid  function is normal - Blood counts are slightly low but stable, hemoglobin 11, hematocrit 33.8 - Vitamin D  levels within a good range at 44.99 - No prior PTH or SPEP  Lifestyle and physical activity - Engages in regular physical activity at the [facility], exercising three to four days per week - Routine includes 35 minutes on the elliptical, 15 minutes on the bicycle, and a 30-minute class - Performs weight training if not attending a class - Active in her yard - Has maintained gym attendance for over 20 years  Social history: -  History of smoking, quit many years ago shortly after starting work at age 81.  2 packs/day x 10 years for 20-pack-year history - No alcohol consumption  Supplement use - Takes calcium and vitamin D  regularly  Osteoporosis history: Prior treatment: YES - fosamax , calcium, Vit D History of Hip, Spine, or Wrist Fracture: YES - L4 compression fx - Prior cervical fracture status post a fall Heart disease or stroke: no Cancer: YES - Hx of breast, lung, ovarian cancer s/p radiation, no prior chemo Kidney Disease: YES Gastric/Peptic Ulcer: no Gastric bypass surgery: no Severe GERD: no History of seizures: no Age at Menopause: 3s Hysterectomy: Yes in her 47s Calcium intake: 1200mg /day Vitamin D  intake: 2000 international units daily Hormone replacement therapy: no Smoking history: FORMER - smoked x 10 years, 2 ppd - 20 pack year hx Alcohol: none Exercise: [employer]-as noted above Major dental work in past year: no Parents with hip/spine fracture: YES - father    Medications:  Outpatient Encounter Medications as of 12/04/2023  Medication Sig   acetaminophen  (TYLENOL ) 500 MG tablet Take 2 tablets (1,000 mg total) by mouth every 6 (six) hours as needed for mild pain.   albuterol  (VENTOLIN  HFA) 108 (90 Base) MCG/ACT inhaler Inhale 2 puffs into the lungs every 6 (six) hours as needed.   amLODipine  (NORVASC ) 10 MG tablet TAKE 1 TABLET BY MOUTH EVERY DAY (Patient taking differently: Take 10 mg by mouth at bedtime.)   Calcium Carbonate-Vitamin D  (CALTRATE 600+D PO) Take 2 tablets by mouth every evening.   Cholecalciferol (VITAMIN D3) 1000 units CAPS Take 1 capsule by mouth daily.  cyclobenzaprine  (FLEXERIL ) 10 MG tablet TAKE 0.5-1 TABLETS (5-10 MG TOTAL) BY MOUTH AT BEDTIME AS NEEDED.   diphenhydrAMINE (BENADRYL) 50 MG tablet Take 50 mg by mouth at bedtime. JET-ASLEEP DOUBLE STRENGTH NIGHTTIME SLEEP AID   famotidine  (PEPCID ) 20 MG tablet TAKE 1 TABLET BY MOUTH TWICE A DAY   FLUoxetine   (PROZAC ) 20 MG capsule TAKE 3 CAPSULES BY MOUTH EVERY DAY (Patient taking differently: Take 40 mg by mouth in the morning.)   losartan  (COZAAR ) 50 MG tablet TAKE 1 TABLET BY MOUTH EVERY DAY   MAGNESIUM PO Take 500 mg by mouth every evening.   MELATONIN PO Take 10 mg by mouth at bedtime.   methocarbamol  (ROBAXIN ) 500 MG tablet Take 1-2 tablets (500-1,000 mg total) by mouth every 6 (six) hours as needed.   Multiple Vitamin (MULTIVITAMIN) tablet Take 1 tablet by mouth at bedtime.   Multiple Vitamins-Minerals (PRESERVISION AREDS 2 PO) Take 1 capsule by mouth 2 (two) times daily.   Secukinumab, 300 MG Dose, (COSENTYX, 300 MG DOSE,) 150 MG/ML SOSY Inject 300 mg as directed every 30 (thirty) days.   TRELEGY ELLIPTA  100-62.5-25 MCG/ACT AEPB INHALE 1 PUFF INTO THE LUNGS DAILY   No facility-administered encounter medications on file as of 12/04/2023.    Allergies: is allergic to sulfa antibiotics.  Physical Examination: Vitals: BP (!) 123/50   Ht 5' (1.524 m)   Wt 125 lb (56.7 kg)   BMI 24.41 kg/m  GENERAL:  Pamela Morales is a 83 y.o. female appearing their stated age, alert and oriented x 3, in no apparent distress.  MSK: Comfortably in exam room chair, normal gross upper extremity lower extremity strength.  Ambulating without a limp Psych: Appropriate mood, good insight, answering questions appropriately  LABS: - labs completed with PCP 07/25/23, all reviewed during visit today - Chronic kidney disease with CMP showing BUN 27, creatinine 1.46, GFR 32.91, normal LFTs, normal calcium 9.2 - Thyroid  function is normal - Blood counts are slightly low but stable, hemoglobin 11, hematocrit 33.8 - Vitamin D  levels within a good range at 44.99 - No prior PTH or SPEP  Radiology: DEXA SCAN 11/01/23 @ [facility] showing:   Assessment & Plan Osteoporosis with history of vertebral compression fracture, has elevated FRAX score with 10-year fracture risk of 27% for overall fracture and 9.1% for hip fracture,  putting her in high risk category. Osteoporosis confirmed by DEXA scan with increased fracture risk due to osteoporosis, smoking, and cancer history. Current exercise and supplementation beneficial.  - Prior records reviewed including DEXA scan, labs during visit today - Treatment options discussed.  Evenity recommended for bone-building, Prolia for maintenance. Insurance coverage to be considered.  She will be changing insurance at the start of the new year, plan to submit insurance authorization for [facility] in January after insurance change.  Will send a message to my staff - Ordered parathyroid hormone and SPEP tests to rule out other secondary causes of osteoporosis. - Continue calcium and vitamin D  supplementation.  She is currently taking adequate amounts of these.  She inquired about additional vitamin K supplementation, would be okay for her to do this as well to help with calcium and vitamin D  absorption. - Maintain current exercise regimen, including weight-bearing exercises. - Consider vitamin K supplementation for calcium and vitamin D  absorption.  Chronic kidney disease - Not a candidate for oral bisphosphonates due to underlying CKD - Continue monitoring kidney function as part of routine care.  Personal history of breast, ovarian, and lung cancer (all  in remission)  Breast, ovarian, and lung cancers in remission. Regular follow-ups and upcoming CT scan for lung cancer surveillance. -Underlying history of cancer may have contributed to her underlying bone health issues and osteoporosis - Continue regular follow-ups and surveillance for cancer recurrence. - Proceed with scheduled CT scan of the lung.   Patient expressed understanding & agreement with above.  Encounter Diagnoses  Name Primary?   Age-related osteoporosis without current pathological fracture Yes   Chronic renal impairment, stage 3b    History of vertebral compression fracture    History of breast cancer     History of ovarian cancer    History of lung cancer     Orders Placed This Encounter  Procedures   Protein electrophoresis, serum   PTH, intact (no Ca)     Contains text generated by [employer].

## 2023-12-07 ENCOUNTER — Encounter (INDEPENDENT_AMBULATORY_CARE_PROVIDER_SITE_OTHER): Admitting: Ophthalmology

## 2023-12-07 DIAGNOSIS — H348122 Central retinal vein occlusion, left eye, stable: Secondary | ICD-10-CM | POA: Diagnosis not present

## 2023-12-07 DIAGNOSIS — H353132 Nonexudative age-related macular degeneration, bilateral, intermediate dry stage: Secondary | ICD-10-CM

## 2023-12-07 DIAGNOSIS — I10 Essential (primary) hypertension: Secondary | ICD-10-CM

## 2023-12-07 DIAGNOSIS — H35033 Hypertensive retinopathy, bilateral: Secondary | ICD-10-CM

## 2023-12-07 DIAGNOSIS — H43813 Vitreous degeneration, bilateral: Secondary | ICD-10-CM | POA: Diagnosis not present

## 2023-12-12 ENCOUNTER — Ambulatory Visit
Admission: RE | Admit: 2023-12-12 | Discharge: 2023-12-12 | Disposition: A | Source: Ambulatory Visit | Attending: Oncology | Admitting: Oncology

## 2023-12-12 DIAGNOSIS — R911 Solitary pulmonary nodule: Secondary | ICD-10-CM | POA: Diagnosis not present

## 2023-12-12 DIAGNOSIS — C3490 Malignant neoplasm of unspecified part of unspecified bronchus or lung: Secondary | ICD-10-CM | POA: Diagnosis not present

## 2023-12-12 DIAGNOSIS — N2889 Other specified disorders of kidney and ureter: Secondary | ICD-10-CM | POA: Diagnosis not present

## 2023-12-12 DIAGNOSIS — Z8781 Personal history of (healed) traumatic fracture: Secondary | ICD-10-CM | POA: Diagnosis not present

## 2023-12-12 DIAGNOSIS — C3492 Malignant neoplasm of unspecified part of left bronchus or lung: Secondary | ICD-10-CM

## 2023-12-12 MED ORDER — IOHEXOL 300 MG/ML  SOLN
75.0000 mL | Freq: Once | INTRAMUSCULAR | Status: AC | PRN
  Administered 2023-12-12: 75 mL via INTRAVENOUS

## 2023-12-13 LAB — POCT I-STAT CREATININE: Creatinine, Ser: 1.4 mg/dL — ABNORMAL HIGH (ref 0.44–1.00)

## 2023-12-14 ENCOUNTER — Ambulatory Visit: Payer: Self-pay | Admitting: Family Medicine

## 2023-12-14 LAB — PROTEIN ELECTROPHORESIS, SERUM
A/G Ratio: 1 (ref 0.7–1.7)
Albumin ELP: 3.7 g/dL (ref 2.9–4.4)
Alpha 1: 0.3 g/dL (ref 0.0–0.4)
Alpha 2: 1 g/dL (ref 0.4–1.0)
Beta: 1.1 g/dL (ref 0.7–1.3)
Gamma Globulin: 1.3 g/dL (ref 0.4–1.8)
Globulin, Total: 3.7 g/dL (ref 2.2–3.9)
M-Spike, %: 0.3 g/dL — ABNORMAL HIGH
Total Protein: 7.4 g/dL (ref 6.0–8.5)

## 2023-12-14 LAB — PARATHYROID HORMONE, INTACT (NO CA): PTH: 56 pg/mL (ref 15–65)

## 2023-12-14 NOTE — Progress Notes (Signed)
 Attempted to call patient 12/14/2023 at 9:45 AM EST, no answer.  Left message on machine that we will send a MyChart message.  Labs reviewed, had normal PTH.  SPEP does show faint M spike which could be benign or could potentially be a paraprotein is seen in multiple myeloma, [place]strm's macroglobulinemia, or lymphoma.  Patient does have prior history of cancer including breast, lung, ovarian cancer.  She has upcoming appointment with oncology Dr. Jacobo 12/19/2023.  Recommend she discuss this further with Dr. Jacobo.  MyChart message was sent  --------------------------------------- Hello Dr. Jacobo,  I have been following with our mutual patient Pamela Morales, for osteoporosis evaluation and treatment, referred by her PCP.  She saw me for an initial evaluation 12/04/2023.  She completed some lab work as part of this evaluation including an SPEP which showed that she had a faint M spike.  I know she has prior history of breast, lung, ovarian cancer.  I see that she has an appointment with you next week 12/19/2023, therefore I wanted to make you aware, I was hoping that she would be able to discuss with her further.  I know that this can sometimes be benign, I would be interested to know your thoughts and if she needs any further evaluation or workup.  I would also be interested in know your thoughts if there are any concerns or additional things to consider for her osteoporosis.  Please let me know if you have any questions.  Thank you for your time and consideration.  Sincerely, Pamela Cedar DO

## 2023-12-19 ENCOUNTER — Inpatient Hospital Stay: Attending: Oncology | Admitting: Oncology

## 2023-12-19 ENCOUNTER — Encounter: Payer: Self-pay | Admitting: Oncology

## 2023-12-19 VITALS — BP 131/73 | HR 54 | Temp 98.0°F | Resp 18 | Ht 60.0 in | Wt 131.9 lb

## 2023-12-19 DIAGNOSIS — Z8543 Personal history of malignant neoplasm of ovary: Secondary | ICD-10-CM | POA: Diagnosis not present

## 2023-12-19 DIAGNOSIS — Z85118 Personal history of other malignant neoplasm of bronchus and lung: Secondary | ICD-10-CM | POA: Insufficient documentation

## 2023-12-19 DIAGNOSIS — Z9010 Acquired absence of unspecified breast and nipple: Secondary | ICD-10-CM | POA: Diagnosis not present

## 2023-12-19 DIAGNOSIS — Z85820 Personal history of malignant melanoma of skin: Secondary | ICD-10-CM | POA: Insufficient documentation

## 2023-12-19 DIAGNOSIS — Z90710 Acquired absence of both cervix and uterus: Secondary | ICD-10-CM | POA: Insufficient documentation

## 2023-12-19 DIAGNOSIS — E041 Nontoxic single thyroid nodule: Secondary | ICD-10-CM | POA: Diagnosis not present

## 2023-12-19 DIAGNOSIS — Z853 Personal history of malignant neoplasm of breast: Secondary | ICD-10-CM | POA: Diagnosis not present

## 2023-12-19 DIAGNOSIS — C3492 Malignant neoplasm of unspecified part of left bronchus or lung: Secondary | ICD-10-CM | POA: Diagnosis not present

## 2023-12-19 NOTE — Progress Notes (Unsigned)
 Patient been doing ok. She had a CT scan on 12/12/2023.

## 2023-12-19 NOTE — Progress Notes (Unsigned)
 [facility]  [phone] [phone]  ID: Pamela Morales OB: 02-24-1940  MR#: [id number]  RDW#:[id number]  Patient Care Team: Randeen Laine LABOR, MD as PCP - General (Family Medicine) Robinson Pao, MD as Consulting Physician (Dermatology) Paul Barrio, OD as Consulting Physician (Optometry) Gail Favorite, MD as Consulting Physician (General Surgery) Verdene Gills, RN as Oncology Nurse Navigator Jacobo, Evalene PARAS, MD as Consulting Physician (Oncology) Fate Morna SAILOR, [facility] (Inactive) as Pharmacist (Pharmacist) Tamea Dedra CROME, MD as Consulting Physician (Pulmonary Disease) Lenn Aran, MD as Consulting Physician (Radiation Oncology) Beuford Anes, MD as Consulting Physician (Orthopedic Surgery)  CHIEF COMPLAINT: 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  INTERVAL HISTORY: Patient returns to clinic today for routine 84-month evaluation and discussion of her CT scan results.  She currently feels well and is asymptomatic.  She remains active and continues to go to the gym 3-4 times a week. She has no neurologic complaints.  She denies any recent fevers or illnesses.  She has a good appetite and denies weight loss.  She denies any chest pain, shortness of breath, cough, hemoptysis.  She denies any nausea, vomiting, constipation, or diarrhea.  She has no urinary complaints.  Patient offers no specific complaints today.  REVIEW OF SYSTEMS:   Review of Systems  Constitutional: Negative.  Negative for fever, malaise/fatigue and weight loss.  Respiratory: Negative.  Negative for cough and shortness of breath.   Cardiovascular: Negative.  Negative for chest pain and leg swelling.  Gastrointestinal: Negative.  Negative for abdominal pain.  Genitourinary: Negative.  Negative for dysuria.  Musculoskeletal: Negative.  Negative for back pain.  Skin: Negative.  Negative for rash.  Neurological: Negative.  Negative for dizziness, focal  weakness, weakness and headaches.  Psychiatric/Behavioral: Negative.  The patient is not nervous/anxious.     As per HPI. Otherwise, a complete review of systems is negative.  PAST MEDICAL HISTORY: Past Medical History:  Diagnosis Date   Allergy    Anemia    Aortic atherosclerosis    Breast cancer (HCC) 2006?   C1 cervical fracture (HCC)    placed in c-collar-to f/u with neurosurgery on 06-21-21 in [place]   [place] - cancer of ovary    Cancer (HCC) 02/10   R breast  (est rec neg and brac neg)   Cataract    FORMING    Chronic kidney disease    Complication of anesthesia    deviated septum-lost memory for about 1 week after anesthesia   COPD (chronic obstructive pulmonary disease) (HCC)    DDD (degenerative disc disease)    in back with chronic pain   Depression    Diverticulosis    DOE (dyspnea on exertion)    Eczema    Elevated serum creatinine    sees nephrology yearly   Elevated TSH    Emphysema of lung (HCC)    Family history of colon cancer    GERD (gastroesophageal reflux disease)    Hearing loss    Hypertension    Hypertriglyceridemia    Mass of left lung    Osteopenia    Psoriatic arthritis (HCC)    Rosacea    Skin cancer 2000?   Squamous cell carcinoma of left lung (HCC)    Thyroid  nodule    Ventral hernia    Zoster 02/2007    PAST SURGICAL HISTORY: Past Surgical History:  Procedure Laterality Date   ABDOMINAL HYSTERECTOMY  1996   BSO fibroids, incidental ovarian CA finding  BASAL CELL CARCINOMA EXCISION  09/2016   BLADDER SUSPENSION     CATARACT EXTRACTION W/ INTRAOCULAR LENS IMPLANT Bilateral    CHOLECYSTECTOMY  08/1996   10 yrs ago   COLONOSCOPY     KYPHOPLASTY N/A 08/16/2023   Procedure: KYPHOPLASTY;  Surgeon: Beuford Anes, MD;  Location: [facility] OR;  Service: Orthopedics;  Laterality: N/A;   LIPOMA EXCISION Right    Abdomen   MASTECTOMY Right 2015   Lymph nodes removed   NASAL SEPTUM SURGERY     prior to 2000s   POLYPECTOMY     VIDEO  BRONCHOSCOPY WITH ENDOBRONCHIAL ULTRASOUND Left 06/16/2021   Procedure: POSSIBLE VIDEO BRONCHOSCOPY WITH ENDOBRONCHIAL ULTRASOUND;  Surgeon: Tamea Dedra CROME, MD;  Location: [facility] ORS;  Service: Pulmonary;  Laterality: Left;   XI ROBOTIC ASSISTED VENTRAL HERNIA N/A 08/16/2022   Procedure: XI ROBOTIC ASSISTED VENTRAL HERNIA;  Surgeon: Desiderio Schanz, MD;  Location: [facility] ORS;  Service: General;  Laterality: N/A;    FAMILY HISTORY: Family History  Problem Relation Age of Onset   Hypertension Mother    Heart disease Mother        CAD   Stomach cancer Mother 34   Cancer Mother    Lung cancer Father 26   Cancer Father    Early death Father    Cancer Sister        colon, skin   Heart disease Sister 68       MI   Diabetes Sister    Colon cancer Sister    Head & neck cancer Sister 95   Lung cancer Sister    Melanoma Sister        multiple on head   Heart disease Brother        HTN and MI   Hypertension Brother    Kidney disease Brother    Cancer Maternal Aunt        unk type   Cancer Paternal Aunt        unk type   Cancer Paternal Uncle        unk type   Colon cancer Daughter 36   Cancer Son    Cancer Sister    Hypertension Sister    Heart disease Daughter    Esophageal cancer Neg Hx    Colon polyps Neg Hx     ADVANCED DIRECTIVES (Y/N):  N  HEALTH MAINTENANCE: Social History   Tobacco Use   Smoking status: Former    Current packs/day: 0.00    Average packs/day: 2.0 packs/day for 10.0 years (20.0 ttl pk-yrs)    Types: Cigarettes    Start date: 01/04/1980    Quit date: 01/03/1990    Years since quitting: 33.9    Passive exposure: Past   Smokeless tobacco: Never  Vaping Use   Vaping status: Never Used  Substance Use Topics   Alcohol use: Not Currently    Alcohol/week: 5.0 standard drinks of alcohol    Types: 5 Glasses of wine per week   Drug use: No     Colonoscopy:  PAP:  Bone density:  Lipid panel:  Allergies  Allergen Reactions   Sulfa Antibiotics      Pt unsure of reaction    Current Outpatient Medications  Medication Sig Dispense Refill   acetaminophen  (TYLENOL ) 500 MG tablet Take 2 tablets (1,000 mg total) by mouth every 6 (six) hours as needed for mild pain.     albuterol  (VENTOLIN  HFA) 108 (90 Base) MCG/ACT inhaler Inhale 2 puffs into the lungs  every 6 (six) hours as needed. 8 g 2   amLODipine  (NORVASC ) 10 MG tablet TAKE 1 TABLET BY MOUTH EVERY DAY (Patient taking differently: Take 10 mg by mouth at bedtime.) 90 tablet 0   Calcium Carbonate-Vitamin D  (CALTRATE 600+D PO) Take 2 tablets by mouth every evening.     Cholecalciferol (VITAMIN D3) 1000 units CAPS Take 1 capsule by mouth daily.     cyclobenzaprine  (FLEXERIL ) 10 MG tablet TAKE 0.5-1 TABLETS (5-10 MG TOTAL) BY MOUTH AT BEDTIME AS NEEDED. 15 tablet 0   diphenhydrAMINE (BENADRYL) 50 MG tablet Take 50 mg by mouth at bedtime. JET-ASLEEP DOUBLE STRENGTH NIGHTTIME SLEEP AID     famotidine  (PEPCID ) 20 MG tablet TAKE 1 TABLET BY MOUTH TWICE A DAY 180 tablet 1   FLUoxetine  (PROZAC ) 20 MG capsule TAKE 3 CAPSULES BY MOUTH EVERY DAY (Patient taking differently: Take 40 mg by mouth in the morning.) 270 capsule 0   losartan  (COZAAR ) 50 MG tablet TAKE 1 TABLET BY MOUTH EVERY DAY 90 tablet 2   MAGNESIUM PO Take 500 mg by mouth every evening.     MELATONIN PO Take 10 mg by mouth at bedtime.     methocarbamol  (ROBAXIN ) 500 MG tablet Take 1-2 tablets (500-1,000 mg total) by mouth every 6 (six) hours as needed. 60 tablet 0   Multiple Vitamin (MULTIVITAMIN) tablet Take 1 tablet by mouth at bedtime.     Multiple Vitamins-Minerals (PRESERVISION AREDS 2 PO) Take 1 capsule by mouth 2 (two) times daily.     Secukinumab, 300 MG Dose, (COSENTYX, 300 MG DOSE,) 150 MG/ML SOSY Inject 300 mg as directed every 30 (thirty) days.     TRELEGY ELLIPTA  100-62.5-25 MCG/ACT AEPB INHALE 1 PUFF INTO THE LUNGS DAILY 60 each 11   No current facility-administered medications for this visit.    OBJECTIVE: Vitals:    12/19/23 1319  BP: 131/73  Pulse: (!) 54  Resp: 18  Temp: 98 F (36.7 C)  SpO2: 97%     Body mass index is 25.76 kg/m.    ECOG FS:0 - Asymptomatic  General: Well-developed, well-nourished, no acute distress. Eyes: Pink conjunctiva, anicteric sclera. HEENT: Normocephalic, moist mucous membranes. Lungs: No audible wheezing or coughing. Heart: Regular rate and rhythm. Abdomen: Soft, nontender, no obvious distention. Musculoskeletal: No edema, cyanosis, or clubbing. Neuro: Alert, answering all questions appropriately. Cranial nerves grossly intact. Skin: No rashes or petechiae noted. Psych: Normal affect.  LAB RESULTS:  Lab Results  Component Value Date   NA 137 07/25/2023   K 4.5 07/25/2023   CL 102 07/25/2023   CO2 27 07/25/2023   GLUCOSE 98 07/25/2023   BUN 27 (H) 07/25/2023   CREATININE 1.40 (H) 12/12/2023   CALCIUM 9.2 07/25/2023   PROT 7.4 12/12/2023   ALBUMIN 3.9 07/25/2023   AST 24 07/25/2023   ALT 26 07/25/2023   ALKPHOS 101 07/25/2023   BILITOT 0.5 07/25/2023   GFRNONAA 41 (L) 08/08/2022   GFRAA (L) 05/07/2008    56        The eGFR has been calculated using the MDRD equation. This calculation has not been validated in all clinical situations. eGFR's persistently <60 mL/min signify possible Chronic Kidney Disease.    Lab Results  Component Value Date   WBC 12.4 (H) 07/25/2023   NEUTROABS 9.2 (H) 07/25/2023   HGB 11.0 (L) 07/25/2023   HCT 33.8 (L) 07/25/2023   MCV 81.4 07/25/2023   PLT 294.0 07/25/2023     STUDIES: CT CHEST W CONTRAST Result  Date: 12/12/2023 CLINICAL DATA:  follow up lung cancer.  * Tracking Code: BO * EXAM: CT CHEST WITH CONTRAST TECHNIQUE: Multidetector CT imaging of the chest was performed during intravenous contrast administration. RADIATION DOSE REDUCTION: This exam was performed according to the departmental dose-optimization program which includes automated exposure control, adjustment of the mA and/or kV according to  patient size and/or use of iterative reconstruction technique. CONTRAST:  75mL OMNIPAQUE  IOHEXOL  300 MG/ML  SOLN COMPARISON:  CT scan chest from 06/05/2023. FINDINGS: Cardiovascular: Normal cardiac size. No pericardial effusion. No aortic aneurysm. There are coronary artery calcifications, in keeping with coronary artery disease. There are also mild-to-moderate peripheral atherosclerotic vascular calcifications of thoracic aorta and its major branches. There is dilation of the main pulmonary trunk measuring up to 3.2 cm, which is nonspecific but can be seen with pulmonary artery hypertension. Mediastinum/Nodes: Redemonstration of a large 2.4 x 3.5 cm hypoattenuating left inferior thyroid  nodule, incompletely characterized on the current exam but grossly similar to the prior study. No solid / cystic mediastinal masses. The esophagus is nondistended precluding optimal assessment. No axillary, mediastinal or hilar lymphadenopathy by size criteria. Lungs/Pleura: The central tracheo-bronchial tree is patent. Redemonstration of airspace opacity in the apicoposterior segment of left upper lobe. When compared to the prior exam, there is interval increase in the density. The nodule appears grossly similar in size when compared to the prior exam however, it is medial margin is obscured due to lung parenchymal changes. Redemonstration of previously noted ground-glass nodule in the right lung lower lobe without significant interval change in size (currently 7 x 11 mm), when remeasured in similar fashion. No interval development of aggressive features. No new mass or consolidation. No pleural effusion or pneumothorax. No new or suspicious lung nodules. Upper Abdomen: There is a subcapsular, linear approximately 6 x 12 mm hyperattenuating area in the right hepatic dome, segment 8, which is incompletely characterized on the current exam but was likely present on the prior study from 11/24/2022 and favored benign in etiology such  as a flash filling hemangioma or arterial portal shunting. Redemonstration of normal-sized spleen containing innumerable ill-defined subcentimeter sized hypoattenuating foci, which are incompletely characterized on this exam but grossly unchanged since the prior study. Remaining visualized upper abdominal viscera within normal limits. Musculoskeletal: The visualized soft tissues of the chest wall are grossly unremarkable. No suspicious osseous lesions. There are mild to moderate multilevel degenerative changes in the visualized spine. IMPRESSION: 1. Redemonstration of airspace opacity in the apicoposterior segment of left upper lobe. When compared to the prior exam, there is interval increase in the density. The nodule appears grossly similar in size when compared to the prior exam however, it is medial margin is obscured due to lung parenchymal changes. Findings are nonspecific and can be due to new focal pneumonia, radiation pneumonitis or worsening metastatic disease. Correlate clinically and with recent treatment. 2. Redemonstration of previously noted ground-glass nodule in the right lung lower lobe without significant interval change in size, when remeasured in similar fashion. No interval development of aggressive features. 3. No new or suspicious lung nodules. No new or enlarging lymphadenopathy. 4. Multiple other nonacute observations, as described above. Aortic Atherosclerosis (ICD10-I70.0). Electronically Signed   By: Ree Molt M.D.   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  Biopsy confirmed the results.  Surgical resection was discussed, but patient opted to pursue XRT only completing in August 2023.  Repeat PET scan on November 13, 2021 with essential resolution of  hypermetabolism of the lesion.  Her most recent imaging on December 12, 2023 reviewed independently and reported as above with no obvious evidence of recurrent or progressive disease.  No intervention is needed.  Return to clinic in 6 months with repeat imaging and further evaluation.  If CT remains stable, can consider transitioning to yearly imaging. History of breast cancer: Greater than 15 years ago.  Patient states she underwent mastectomy, but did not receive any XRT, chemotherapy, or hormonal treatment. History of ovarian cancer: Patient reports this was an incidental on pathology after having a total hysterectomy. History of melanoma: Unclear depth or stage. Thyroid  nodule: Reported stable.  Monitor. Genetics: Patient was previously given a referral to genetics, but it does not appear she has had genetic testing.  I spent a total of 20 minutes reviewing chart data, face-to-face evaluation with the patient, counseling and coordination of care as detailed above.    Patient expressed understanding and was in agreement with this plan. She also understands that She can call clinic at any time with any questions, concerns, or complaints.    Cancer Staging  Squamous cell carcinoma of left lung [facility]) Staging form: Lung, AJCC 8th Edition - Clinical stage from 06/23/2021: Stage IB (cT2a, cN0, cM0) - Signed by Jacobo Evalene PARAS, MD on 06/23/2021   Evalene PARAS Jacobo, MD   12/19/2023 1:36 PM

## 2023-12-20 ENCOUNTER — Encounter: Payer: Self-pay | Admitting: Oncology

## 2023-12-20 ENCOUNTER — Telehealth: Payer: Self-pay | Admitting: Oncology

## 2023-12-20 NOTE — Telephone Encounter (Signed)
 Called pt to sched CT - pt confirmed date/time/location - pt requested appt reminder via mail - LH

## 2023-12-21 ENCOUNTER — Encounter: Payer: Self-pay | Admitting: Pulmonary Disease

## 2023-12-21 ENCOUNTER — Ambulatory Visit: Admitting: Pulmonary Disease

## 2023-12-21 VITALS — BP 126/60 | HR 60 | Temp 97.9°F | Ht 60.0 in | Wt 131.8 lb

## 2023-12-21 DIAGNOSIS — Z87891 Personal history of nicotine dependence: Secondary | ICD-10-CM

## 2023-12-21 DIAGNOSIS — C3492 Malignant neoplasm of unspecified part of left bronchus or lung: Secondary | ICD-10-CM | POA: Diagnosis not present

## 2023-12-21 DIAGNOSIS — R911 Solitary pulmonary nodule: Secondary | ICD-10-CM

## 2023-12-21 DIAGNOSIS — J449 Chronic obstructive pulmonary disease, unspecified: Secondary | ICD-10-CM

## 2023-12-21 DIAGNOSIS — Z923 Personal history of irradiation: Secondary | ICD-10-CM | POA: Diagnosis not present

## 2023-12-21 NOTE — Progress Notes (Signed)
 Subjective:    Patient ID: Pamela Morales, female    DOB: 1940/11/17, 83 y.o.   MRN: [id number]  Patient Care Team: Randeen, Laine LABOR, MD as PCP - General (Family Medicine) Robinson Pao, MD as Consulting Physician (Dermatology) Paul Barrio, OD as Consulting Physician (Optometry) Gail Favorite, MD as Consulting Physician (General Surgery) Verdene Gills, RN as Oncology Nurse Navigator Jacobo, Evalene PARAS, MD as Consulting Physician (Oncology) Fate Morna SAILOR, [facility] (Inactive) as Pharmacist (Pharmacist) Tamea Dedra CROME, MD as Consulting Physician (Pulmonary Disease) Lenn Aran, MD as Consulting Physician (Radiation Oncology) Beuford Anes, MD as Consulting Physician (Orthopedic Surgery)  Chief Complaint  Patient presents with   COPD    Cough, shortness of breath on exertion and wheezing.     BACKGROUND/INTERVAL: Patient is an 83 year old former smoker with a history of stage II COPD and prior squamous cell carcinoma of the lung who presents for follow-up.  She was last seen here on 02 August 2023 as a preoperative visit prior to L4 kyphoplasty.  No exacerbations since then.  HPI Discussed the use of AI scribe software for clinical note transcription with the patient, who gave verbal consent to proceed.  History of Present Illness   Pamela Morales is an 83 year old female with COPD and non-small cell lung cancer who presents for follow-up.  Breathing is stable with COPD, though she experiences frequent throat clearing. She uses Trelegy Ellipta  100 and rinses her mouth after use.  She experienced a fall after being stung multiple times by a bumblebee, resulting in an L4 compression fracture. She was evaluated prior to undergoing kyphoplasty surgery in August which she tolerated well.  The procedure alleviated some symptoms, but she still has back pain when bending, however overall she is better.  She has been informed of having 'soft bones' and is engaging in weight  training and cardio exercises, including elliptical, bicycle, and treadmill workouts for about an hour and a half, three to four days a week. She takes calcium and D3 supplements.  She is on medication for rheumatoid arthritis, taken once a month. She has received her flu shot and reports stable weight. No cough.  No hemoptysis.  Weight and appetite are stable.  She underwent chest CT without contrast on 22 December 2023 as part of her surveillance for lung cancer.  Overall no change, there is significant fibrotic changes post SBRT but no evidence of static disease per se.  Next imaging will be in 6 months time (18 June 2024) already ordered by Dr. Jacobo, her oncologist.    DATA 11/08/2016 PFTs: FEV1 1.5 8 L or 89% predicted, FVC 2.26 L or 99% predicted, FEV1/FVC 70%, TLC 111%, RV 127% diffusion capacity was normal.  Consistent with moderate obstructive airways disease with air trapping and hyperinflation. 06/16/2021 left upper lobe biopsy: Robotic assisted biopsy consistent with squamous cell carcinoma.  Review of Systems A 10 point review of systems was performed and it is as noted above otherwise negative.   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 04/06/2022   Pain of left scapula 04/06/2022   Primary localized osteoarthrosis of multiple sites 08/11/2021   Other long term (current) drug therapy 08/11/2021   Genetic testing 07/07/2021  Squamous cell carcinoma of left lung (HCC) 06/18/2021   Personal history of ovarian cancer 06/15/2021   Family history of melanoma 06/15/2021   Family history of stomach cancer 06/15/2021   Mass of left lung 05/14/2021   CRI (chronic renal insufficiency) 10/23/2020    Left foot pain 01/27/2020   High triglycerides 07/16/2019   COPD (chronic obstructive pulmonary disease) (HCC) 07/12/2018   Elevated TSH 10/18/2016   Elevated serum creatinine 10/18/2016   Estrogen deficiency 03/23/2016   Encounter for screening mammogram for breast cancer 03/23/2016   Psoriatic arthritis (HCC) 03/31/2015   Routine general medical examination at a health care facility 03/25/2015   Fatigue 04/28/2014   Family history of colon cancer 01/01/2013   History of colonic polyps 01/01/2013   Anemia 12/19/2012   Hearing loss 09/19/2012   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   BACK PAIN WITH RADICULOPATHY 03/15/2007   History of herpes zoster 02/23/2007   Essential hypertension 02/05/2007   Osteoporosis 02/05/2007   ECZEMA, ATOPIC 01/29/2007   ROSACEA 01/29/2007   BASAL CELL CARCINOMA, HX OF 01/29/2007    Social History   Tobacco Use   Smoking status: Former    Current packs/day: 0.00    Average packs/day: 2.0 packs/day for 10.0 years (20.0 ttl pk-yrs)    Types: Cigarettes    Start date: 01/04/1980    Quit date: 01/03/1990    Years since quitting: 33.9    Passive exposure: Past   Smokeless tobacco: Never  Substance Use Topics   Alcohol use: Not Currently    Alcohol/week: 5.0 standard drinks of alcohol    Types: 5 Glasses of wine per week    Allergies[1]  Active Medications[2]  Immunization History  Administered Date(s) Administered   Fluad Quad(high Dose 65+) 01/01/2020   INFLUENZA, HIGH DOSE SEASONAL PF 10/12/2016, 10/30/2017, 10/04/2018, 01/01/2020, 09/28/2021, 10/03/2023   Influenza Split 10/11/2011   Influenza Whole 10/04/2006   Influenza,inj,Quad PF,6+ Mos 09/19/2012, 10/31/2013, 10/10/2014   Influenza-Unspecified 09/18/2015   Moderna Sars-Covid-2 Vaccination 01/29/2019, 02/26/2019, 11/23/2019   Pfizer(Comirnaty)Fall Seasonal Vaccine 12 years and older 10/24/2021   Pneumococcal Conjugate-13 04/28/2014   Pneumococcal Polysaccharide-23  08/03/2012   Respiratory Syncytial Virus Vaccine,Recomb Aduvanted(Arexvy) 01/19/2022   Td 01/03/1994, 07/17/2008, 12/10/2018   Zoster Recombinant(Shingrix) 03/25/2021, 08/23/2021        Objective:     Vitals:   12/21/23 1112  BP: 126/60  Pulse: 60  Temp: 97.9 F (36.6 C)  Height: 5' (1.524 m)  Weight: 131 lb 12.8 oz (59.8 kg)  SpO2: 98%  TempSrc: Temporal  BMI (Calculated): 25.74     SpO2: 98 %  GENERAL: Well-developed well-nourished, spry, elderly woman, no acute distress.  Fully ambulatory, no conversational dyspnea.   HEAD: Normocephalic, atraumatic.  EYES: Pupils equal, round, reactive to light.  No scleral icterus.  MOUTH: Oral mucosa moist.  No thrush. NECK: Supple. No thyromegaly. Trachea midline. No JVD.  No adenopathy. PULMONARY: Good air entry bilaterally.  Coarse, otherwise no adventitious sounds. CARDIOVASCULAR: S1 and S2. Regular rate and rhythm.  No rubs, murmurs or gallops heard. ABDOMEN: Benign, ventral hernia repaired. MUSCULOSKELETAL: No joint deformity, no clubbing, no edema.  NEUROLOGIC: No overt focal deficit, no gait disturbance, speech is fluent. SKIN: Intact,warm,dry.  No rashes noted. PSYCH: Mood and behavior normal.        Assessment & Plan:     ICD-10-CM   1. Stage 2 moderate COPD by GOLD classification (HCC)  J44.9     2. Squamous cell carcinoma of  left lung (HCC)  C34.92     3. Lung nodule RLL, 9 mm  R91.1      Discussion:    Chronic obstructive pulmonary disease (COPD), moderate, GOLD stage 2 COPD is well-managed with no significant dyspnea. Reports throat clearing, likely due to inhaler use. - Advised rinsing mouth with water  and baking soda after inhaler use to reduce throat clearing.  Squamous cell carcinoma of the left lung, status post radiation therapy Post-radiation changes in the lung are stable. Radiation-induced changes continue to evolve. - Continue monitoring with CT scan in six months.      Advised if symptoms  do not improve or worsen, to please contact office for sooner follow up or seek emergency care.    I spent 35 minutes of dedicated to the care of this patient on the date of this encounter to include pre-visit review of records, face-to-face time with the patient discussing conditions above, post visit ordering of testing, clinical documentation with the electronic health record, making appropriate referrals as documented, and communicating necessary findings to members of the patients care team.     C. Leita Sanders, MD Advanced Bronchoscopy PCCM [facility]    *This note was generated using voice recognition software/Dragon and/or AI transcription program.  Despite best efforts to proofread, errors can occur which can change the meaning. Any transcriptional errors that result from this process are unintentional and may not be fully corrected at the time of dictation.     [1]  Allergies Allergen Reactions   Sulfa Antibiotics     Pt unsure of reaction  [2]  Current Meds  Medication Sig   acetaminophen  (TYLENOL ) 500 MG tablet Take 2 tablets (1,000 mg total) by mouth every 6 (six) hours as needed for mild pain.   albuterol  (VENTOLIN  HFA) 108 (90 Base) MCG/ACT inhaler Inhale 2 puffs into the lungs every 6 (six) hours as needed.   amLODipine  (NORVASC ) 10 MG tablet TAKE 1 TABLET BY MOUTH EVERY DAY (Patient taking differently: Take 10 mg by mouth at bedtime.)   Calcium Carbonate-Vitamin D  (CALTRATE 600+D PO) Take 2 tablets by mouth every evening.   Cholecalciferol (VITAMIN D3) 1000 units CAPS Take 1 capsule by mouth daily.   cyclobenzaprine  (FLEXERIL ) 10 MG tablet TAKE 0.5-1 TABLETS (5-10 MG TOTAL) BY MOUTH AT BEDTIME AS NEEDED.   diphenhydrAMINE (BENADRYL) 50 MG tablet Take 50 mg by mouth at bedtime. JET-ASLEEP DOUBLE STRENGTH NIGHTTIME SLEEP AID   famotidine  (PEPCID ) 20 MG tablet TAKE 1 TABLET BY MOUTH TWICE A DAY   FLUoxetine  (PROZAC ) 20 MG capsule TAKE 3 CAPSULES BY  MOUTH EVERY DAY (Patient taking differently: Take 40 mg by mouth in the morning.)   losartan  (COZAAR ) 50 MG tablet TAKE 1 TABLET BY MOUTH EVERY DAY   MAGNESIUM PO Take 500 mg by mouth every evening.   MELATONIN PO Take 10 mg by mouth at bedtime.   Multiple Vitamin (MULTIVITAMIN) tablet Take 1 tablet by mouth at bedtime.   Multiple Vitamins-Minerals (PRESERVISION AREDS 2 PO) Take 1 capsule by mouth 2 (two) times daily.   Secukinumab, 300 MG Dose, (COSENTYX, 300 MG DOSE,) 150 MG/ML SOSY Inject 300 mg as directed every 30 (thirty) days.   TRELEGY ELLIPTA  100-62.5-25 MCG/ACT AEPB INHALE 1 PUFF INTO THE LUNGS DAILY

## 2023-12-21 NOTE — Patient Instructions (Signed)
 VISIT SUMMARY:  Today, you came in for a follow-up visit. Your breathing is stable with COPD, and you are managing well with your inhaler. You experienced a fall after being stung by a bumblebee, which led to a back fracture that was treated with surgery. You are actively engaging in weight training and cardio exercises to strengthen your bones. Your rheumatoid arthritis medication is being taken as prescribed, and you have received your flu shot. Your weight is stable, and you have no cough.  YOUR PLAN:  -CHRONIC OBSTRUCTIVE PULMONARY DISEASE (COPD): COPD is a chronic lung condition that makes it hard to breathe. Your COPD is well-managed, but you experience frequent throat clearing, likely due to inhaler use. To help with this, rinse your mouth with water  and baking soda after using your inhaler.  -SQUAMOUS CELL CARCINOMA OF THE LEFT LUNG: This is a type of lung cancer. Your post-radiation changes in the lung are stable. We will continue to monitor your condition with a CT scan in six months.  INSTRUCTIONS:  Please continue your current medications and follow the advice given today. We will schedule a CT scan in six months to monitor your lung condition. If you experience any new symptoms or have concerns, please contact our office.

## 2023-12-22 ENCOUNTER — Telehealth: Payer: Self-pay | Admitting: *Deleted

## 2023-12-22 NOTE — Telephone Encounter (Signed)
 Patient stated that Pamela Morales spoke with Dr. Jacobo last week and she was supposed to a follow-up apt set up regarding her spep results. She has not heard anything from the team and would like an apt set up with Dr. Jacobo. Please return her phone call.

## 2023-12-25 NOTE — Telephone Encounter (Signed)
 Patient called again this morning and requested a return phone call with an apt with Dr. Jacobo in the next week weeks to discuss her abn spep panel.

## 2023-12-30 ENCOUNTER — Other Ambulatory Visit: Payer: Self-pay | Admitting: Family Medicine

## 2024-01-09 ENCOUNTER — Inpatient Hospital Stay: Payer: Medicare (Managed Care)

## 2024-01-09 ENCOUNTER — Encounter: Payer: Self-pay | Admitting: Oncology

## 2024-01-09 ENCOUNTER — Inpatient Hospital Stay: Payer: Medicare (Managed Care) | Attending: Oncology | Admitting: Oncology

## 2024-01-09 VITALS — BP 140/68 | HR 59 | Temp 97.9°F | Resp 18 | Ht 60.0 in | Wt 131.8 lb

## 2024-01-09 DIAGNOSIS — D472 Monoclonal gammopathy: Secondary | ICD-10-CM

## 2024-01-09 LAB — CBC WITH DIFFERENTIAL/PLATELET
Abs Immature Granulocytes: 0.05 K/uL (ref 0.00–0.07)
Basophils Absolute: 0 K/uL (ref 0.0–0.1)
Basophils Relative: 1 %
Eosinophils Absolute: 0.1 K/uL (ref 0.0–0.5)
Eosinophils Relative: 1 %
HCT: 33.5 % — ABNORMAL LOW (ref 36.0–46.0)
Hemoglobin: 11.1 g/dL — ABNORMAL LOW (ref 12.0–15.0)
Immature Granulocytes: 1 %
Lymphocytes Relative: 23 %
Lymphs Abs: 2.1 K/uL (ref 0.7–4.0)
MCH: 27.5 pg (ref 26.0–34.0)
MCHC: 33.1 g/dL (ref 30.0–36.0)
MCV: 82.9 fL (ref 80.0–100.0)
Monocytes Absolute: 0.6 K/uL (ref 0.1–1.0)
Monocytes Relative: 7 %
Neutro Abs: 6 K/uL (ref 1.7–7.7)
Neutrophils Relative %: 67 %
Platelets: 244 K/uL (ref 150–400)
RBC: 4.04 MIL/uL (ref 3.87–5.11)
RDW: 13.2 % (ref 11.5–15.5)
WBC: 8.9 K/uL (ref 4.0–10.5)
nRBC: 0 % (ref 0.0–0.2)

## 2024-01-09 LAB — BASIC METABOLIC PANEL - CANCER CENTER ONLY
Anion gap: 13 (ref 5–15)
BUN: 26 mg/dL — ABNORMAL HIGH (ref 8–23)
CO2: 23 mmol/L (ref 22–32)
Calcium: 9.5 mg/dL (ref 8.9–10.3)
Chloride: 103 mmol/L (ref 98–111)
Creatinine: 1.51 mg/dL — ABNORMAL HIGH (ref 0.44–1.00)
GFR, Estimated: 34 mL/min — ABNORMAL LOW
Glucose, Bld: 99 mg/dL (ref 70–99)
Potassium: 4 mmol/L (ref 3.5–5.1)
Sodium: 139 mmol/L (ref 135–145)

## 2024-01-09 NOTE — Progress Notes (Signed)
 " [facility]  [phone] [phone]  ID: Pamela Morales OB: Apr 08, 1940  MR#: [id number]  RDW#:[id number]  Patient Care Team: Randeen Laine LABOR, MD as PCP - General (Family Medicine) Robinson Pao, MD as Consulting Physician (Dermatology) Paul Barrio, OD as Consulting Physician (Optometry) Gail Favorite, MD as Consulting Physician (General Surgery) Verdene Gills, RN as Oncology Nurse Navigator Jacobo, Evalene PARAS, MD as Consulting Physician (Oncology) Fate Morna SAILOR, [facility] (Inactive) as Pharmacist (Pharmacist) Tamea Dedra CROME, MD as Consulting Physician (Pulmonary Disease) Lenn Aran, MD as Consulting Physician (Radiation Oncology) Beuford Anes, MD as Consulting Physician (Orthopedic Surgery)  CHIEF COMPLAINT: 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  INTERVAL HISTORY: Patient returns to clinic today as an add-on for further evaluation and discussion of a positive M spike on SPEP on recent laboratory work.  She continues to feel well and remains asymptomatic.  She remains active and continues to go to the gym 3-4 times a week. She has no neurologic complaints.  She denies any recent fevers or illnesses.  She has a good appetite and denies weight loss.  She denies any chest pain, shortness of breath, cough, hemoptysis.  She denies any nausea, vomiting, constipation, or diarrhea.  She has no urinary complaints.  Patient offers no specific complaints today.  REVIEW OF SYSTEMS:   Review of Systems  Constitutional: Negative.  Negative for fever, malaise/fatigue and weight loss.  Respiratory: Negative.  Negative for cough and shortness of breath.   Cardiovascular: Negative.  Negative for chest pain and leg swelling.  Gastrointestinal: Negative.  Negative for abdominal pain.  Genitourinary: Negative.  Negative for dysuria.  Musculoskeletal: Negative.  Negative for back pain.  Skin: Negative.  Negative for rash.   Neurological: Negative.  Negative for dizziness, focal weakness, weakness and headaches.  Psychiatric/Behavioral: Negative.  The patient is not nervous/anxious.     As per HPI. Otherwise, a complete review of systems is negative.  PAST MEDICAL HISTORY: Past Medical History:  Diagnosis Date   Allergy    Anemia    Aortic atherosclerosis    Breast cancer (HCC) 2006?   C1 cervical fracture (HCC)    placed in c-collar-to f/u with neurosurgery on 06-21-21 in [place]   CA - cancer of ovary    Cancer (HCC) 02/10   R breast  (est rec neg and brac neg)   Cataract    FORMING    Chronic kidney disease    Complication of anesthesia    deviated septum-lost memory for about 1 week after anesthesia   COPD (chronic obstructive pulmonary disease) (HCC)    DDD (degenerative disc disease)    in back with chronic pain   Depression    Diverticulosis    DOE (dyspnea on exertion)    Eczema    Elevated serum creatinine    sees nephrology yearly   Elevated TSH    Emphysema of lung (HCC)    Family history of colon cancer    GERD (gastroesophageal reflux disease)    Hearing loss    Hypertension    Hypertriglyceridemia    Mass of left lung    Osteopenia    Psoriatic arthritis (HCC)    Rosacea    Skin cancer 2000?   Squamous cell carcinoma of left lung (HCC)    Thyroid  nodule    Ventral hernia    Zoster 02/2007    PAST SURGICAL HISTORY: Past Surgical History:  Procedure Laterality Date   ABDOMINAL HYSTERECTOMY  1996   BSO fibroids, incidental ovarian CA finding   BASAL CELL CARCINOMA EXCISION  09/2016   BLADDER SUSPENSION     CATARACT EXTRACTION W/ INTRAOCULAR LENS IMPLANT Bilateral    CHOLECYSTECTOMY  08/1996   10 yrs ago   COLONOSCOPY     KYPHOPLASTY N/A 08/16/2023   Procedure: KYPHOPLASTY;  Surgeon: Beuford Anes, MD;  Location: [facility] OR;  Service: Orthopedics;  Laterality: N/A;   LIPOMA EXCISION Right    Abdomen   MASTECTOMY Right 2015   Lymph nodes removed   NASAL SEPTUM  SURGERY     prior to 2000s   POLYPECTOMY     VIDEO BRONCHOSCOPY WITH ENDOBRONCHIAL ULTRASOUND Left 06/16/2021   Procedure: POSSIBLE VIDEO BRONCHOSCOPY WITH ENDOBRONCHIAL ULTRASOUND;  Surgeon: Tamea Dedra CROME, MD;  Location: [facility] ORS;  Service: Pulmonary;  Laterality: Left;   XI ROBOTIC ASSISTED VENTRAL HERNIA N/A 08/16/2022   Procedure: XI ROBOTIC ASSISTED VENTRAL HERNIA;  Surgeon: Desiderio Schanz, MD;  Location: [facility] ORS;  Service: General;  Laterality: N/A;    FAMILY HISTORY: Family History  Problem Relation Age of Onset   Hypertension Mother    Heart disease Mother        CAD   Stomach cancer Mother 67   Cancer Mother    Lung cancer Father 31   Cancer Father    Early death Father    Cancer Sister        colon, skin   Heart disease Sister 33       MI   Diabetes Sister    Colon cancer Sister    Head & neck cancer Sister 77   Lung cancer Sister    Melanoma Sister        multiple on head   Heart disease Brother        HTN and MI   Hypertension Brother    Kidney disease Brother    Cancer Maternal Aunt        unk type   Cancer Paternal Aunt        unk type   Cancer Paternal Uncle        unk type   Colon cancer Daughter 75   Cancer Son    Cancer Sister    Hypertension Sister    Heart disease Daughter    Esophageal cancer Neg Hx    Colon polyps Neg Hx     ADVANCED DIRECTIVES (Y/N):  N  HEALTH MAINTENANCE: Social History   Tobacco Use   Smoking status: Former    Current packs/day: 0.00    Average packs/day: 2.0 packs/day for 10.0 years (20.0 ttl pk-yrs)    Types: Cigarettes    Start date: 01/04/1980    Quit date: 01/03/1990    Years since quitting: 34.0    Passive exposure: Past   Smokeless tobacco: Never  Vaping Use   Vaping status: Never Used  Substance Use Topics   Alcohol use: Not Currently    Alcohol/week: 5.0 standard drinks of alcohol    Types: 5 Glasses of wine per week   Drug use: No     Colonoscopy:  PAP:  Bone density:  Lipid  panel:  Allergies  Allergen Reactions   Sulfa Antibiotics     Pt unsure of reaction    Current Outpatient Medications  Medication Sig Dispense Refill   acetaminophen  (TYLENOL ) 500 MG tablet Take 2 tablets (1,000 mg total) by mouth every 6 (six) hours as needed for mild pain.     albuterol  (VENTOLIN  HFA)  108 (90 Base) MCG/ACT inhaler Inhale 2 puffs into the lungs every 6 (six) hours as needed. 8 g 2   amLODipine  (NORVASC ) 10 MG tablet TAKE 1 TABLET BY MOUTH EVERY DAY 90 tablet 0   Calcium Carbonate-Vitamin D  (CALTRATE 600+D PO) Take 2 tablets by mouth every evening.     Cholecalciferol (VITAMIN D3) 1000 units CAPS Take 1 capsule by mouth daily.     cyclobenzaprine  (FLEXERIL ) 10 MG tablet TAKE 0.5-1 TABLETS (5-10 MG TOTAL) BY MOUTH AT BEDTIME AS NEEDED. 15 tablet 0   diphenhydrAMINE (BENADRYL) 50 MG tablet Take 50 mg by mouth at bedtime. JET-ASLEEP DOUBLE STRENGTH NIGHTTIME SLEEP AID     famotidine  (PEPCID ) 20 MG tablet TAKE 1 TABLET BY MOUTH TWICE A DAY 180 tablet 1   FLUoxetine  (PROZAC ) 20 MG capsule TAKE 3 CAPSULES BY MOUTH EVERY DAY 270 capsule 0   losartan  (COZAAR ) 50 MG tablet TAKE 1 TABLET BY MOUTH EVERY DAY 90 tablet 2   MAGNESIUM PO Take 500 mg by mouth every evening.     MELATONIN PO Take 10 mg by mouth at bedtime.     Multiple Vitamin (MULTIVITAMIN) tablet Take 1 tablet by mouth at bedtime.     Multiple Vitamins-Minerals (PRESERVISION AREDS 2 PO) Take 1 capsule by mouth 2 (two) times daily.     Secukinumab, 300 MG Dose, (COSENTYX, 300 MG DOSE,) 150 MG/ML SOSY Inject 300 mg as directed every 30 (thirty) days.     TRELEGY ELLIPTA  100-62.5-25 MCG/ACT AEPB INHALE 1 PUFF INTO THE LUNGS DAILY 60 each 11   No current facility-administered medications for this visit.    OBJECTIVE: Vitals:   01/09/24 1006  BP: (!) 140/68  Pulse: (!) 59  Resp: 18  Temp: 97.9 F (36.6 C)  SpO2: 100%     Body mass index is 25.74 kg/m.    ECOG FS:0 - Asymptomatic  General: Well-developed,  well-nourished, no acute distress. Eyes: Pink conjunctiva, anicteric sclera. HEENT: Normocephalic, moist mucous membranes. Lungs: No audible wheezing or coughing. Heart: Regular rate and rhythm. Abdomen: Soft, nontender, no obvious distention. Musculoskeletal: No edema, cyanosis, or clubbing. Neuro: Alert, answering all questions appropriately. Cranial nerves grossly intact. Skin: No rashes or petechiae noted. Psych: Normal affect.  LAB RESULTS:  Lab Results  Component Value Date   NA 137 07/25/2023   K 4.5 07/25/2023   CL 102 07/25/2023   CO2 27 07/25/2023   GLUCOSE 98 07/25/2023   BUN 27 (H) 07/25/2023   CREATININE 1.40 (H) 12/12/2023   CALCIUM 9.2 07/25/2023   PROT 7.4 12/12/2023   ALBUMIN 3.9 07/25/2023   AST 24 07/25/2023   ALT 26 07/25/2023   ALKPHOS 101 07/25/2023   BILITOT 0.5 07/25/2023   GFRNONAA 41 (L) 08/08/2022   GFRAA (L) 05/07/2008    56        The eGFR has been calculated using the MDRD equation. This calculation has not been validated in all clinical situations. eGFR's persistently <60 mL/min signify possible Chronic Kidney Disease.    Lab Results  Component Value Date   WBC 12.4 (H) 07/25/2023   NEUTROABS 9.2 (H) 07/25/2023   HGB 11.0 (L) 07/25/2023   HCT 33.8 (L) 07/25/2023   MCV 81.4 07/25/2023   PLT 294.0 07/25/2023     STUDIES: CT CHEST W CONTRAST Result Date: 12/12/2023 CLINICAL DATA:  follow up lung cancer.  * Tracking Code: BO * EXAM: CT CHEST WITH CONTRAST TECHNIQUE: Multidetector CT imaging of the chest was performed during intravenous contrast administration.  RADIATION DOSE REDUCTION: This exam was performed according to the departmental dose-optimization program which includes automated exposure control, adjustment of the mA and/or kV according to patient size and/or use of iterative reconstruction technique. CONTRAST:  75mL OMNIPAQUE  IOHEXOL  300 MG/ML  SOLN COMPARISON:  CT scan chest from 06/05/2023. FINDINGS: Cardiovascular: Normal  cardiac size. No pericardial effusion. No aortic aneurysm. There are coronary artery calcifications, in keeping with coronary artery disease. There are also mild-to-moderate peripheral atherosclerotic vascular calcifications of thoracic aorta and its major branches. There is dilation of the main pulmonary trunk measuring up to 3.2 cm, which is nonspecific but can be seen with pulmonary artery hypertension. Mediastinum/Nodes: Redemonstration of a large 2.4 x 3.5 cm hypoattenuating left inferior thyroid  nodule, incompletely characterized on the current exam but grossly similar to the prior study. No solid / cystic mediastinal masses. The esophagus is nondistended precluding optimal assessment. No axillary, mediastinal or hilar lymphadenopathy by size criteria. Lungs/Pleura: The central tracheo-bronchial tree is patent. Redemonstration of airspace opacity in the apicoposterior segment of left upper lobe. When compared to the prior exam, there is interval increase in the density. The nodule appears grossly similar in size when compared to the prior exam however, it is medial margin is obscured due to lung parenchymal changes. Redemonstration of previously noted ground-glass nodule in the right lung lower lobe without significant interval change in size (currently 7 x 11 mm), when remeasured in similar fashion. No interval development of aggressive features. No new mass or consolidation. No pleural effusion or pneumothorax. No new or suspicious lung nodules. Upper Abdomen: There is a subcapsular, linear approximately 6 x 12 mm hyperattenuating area in the right hepatic dome, segment 8, which is incompletely characterized on the current exam but was likely present on the prior study from 11/24/2022 and favored benign in etiology such as a flash filling hemangioma or arterial portal shunting. Redemonstration of normal-sized spleen containing innumerable ill-defined subcentimeter sized hypoattenuating foci, which are  incompletely characterized on this exam but grossly unchanged since the prior study. Remaining visualized upper abdominal viscera within normal limits. Musculoskeletal: The visualized soft tissues of the chest wall are grossly unremarkable. No suspicious osseous lesions. There are mild to moderate multilevel degenerative changes in the visualized spine. IMPRESSION: 1. Redemonstration of airspace opacity in the apicoposterior segment of left upper lobe. When compared to the prior exam, there is interval increase in the density. The nodule appears grossly similar in size when compared to the prior exam however, it is medial margin is obscured due to lung parenchymal changes. Findings are nonspecific and can be due to new focal pneumonia, radiation pneumonitis or worsening metastatic disease. Correlate clinically and with recent treatment. 2. Redemonstration of previously noted ground-glass nodule in the right lung lower lobe without significant interval change in size, when remeasured in similar fashion. No interval development of aggressive features. 3. No new or suspicious lung nodules. No new or enlarging lymphadenopathy. 4. Multiple other nonacute observations, as described above. Aortic Atherosclerosis (ICD10-I70.0). Electronically Signed   By: Ree Molt M.D.   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  Biopsy confirmed the results.  Surgical resection was discussed, but patient opted to pursue XRT only completing in August 2023.  Repeat PET scan  on November 13, 2021 with essential resolution of hypermetabolism of the lesion.  Her most recent imaging on December 12, 2023 reviewed independently with no obvious evidence of recurrent or progressive disease.  No intervention is  needed.  Return to clinic as previously scheduled in June 2026 for repeat imaging and further evaluation.  If CT remains stable, can consider transitioning to yearly imaging. MGUS: Patient's most recent SPEP revealed an M spike of 0.3.  She has a mild chronic anemia as well as chronic renal insufficiency, but no other evidence of endorgan damage.  Will repeat laboratory work today.  Patient will then follow-up in 6 months as above with repeat laboratory work and further evaluation. History of breast cancer: Greater than 15 years ago.  Patient states she underwent mastectomy, but did not receive any XRT, chemotherapy, or hormonal treatment. History of ovarian cancer: Patient reports this was an incidental on pathology after having a total hysterectomy. History of melanoma: Unclear depth or stage. Thyroid  nodule: Reported stable.  Monitor. Genetics: Patient was previously given a referral to genetics, but it does not appear she has had genetic testing.  I spent a total of 20 minutes reviewing chart data, face-to-face evaluation with the patient, counseling and coordination of care as detailed above.   Patient expressed understanding and was in agreement with this plan. She also understands that She can call clinic at any time with any questions, concerns, or complaints.    Cancer Staging  Squamous cell carcinoma of left lung [facility]) Staging form: Lung, AJCC 8th Edition - Clinical stage from 06/23/2021: Stage IB (cT2a, cN0, cM0) - Signed by Jacobo Evalene PARAS, MD on 06/23/2021   Evalene PARAS Jacobo, MD   01/09/2024 10:12 AM     "

## 2024-01-10 LAB — PROTEIN ELECTROPHORESIS, SERUM
A/G Ratio: 1 (ref 0.7–1.7)
Albumin ELP: 3.5 g/dL (ref 2.9–4.4)
Alpha-1-Globulin: 0.3 g/dL (ref 0.0–0.4)
Alpha-2-Globulin: 0.9 g/dL (ref 0.4–1.0)
Beta Globulin: 1.1 g/dL (ref 0.7–1.3)
Gamma Globulin: 1.1 g/dL (ref 0.4–1.8)
Globulin, Total: 3.4 g/dL (ref 2.2–3.9)
Total Protein ELP: 6.9 g/dL (ref 6.0–8.5)

## 2024-01-10 LAB — IGG, IGA, IGM
IgA: 386 mg/dL (ref 64–422)
IgG (Immunoglobin G), Serum: 1142 mg/dL (ref 586–1602)
IgM (Immunoglobulin M), Srm: 30 mg/dL (ref 26–217)

## 2024-01-10 LAB — KAPPA/LAMBDA LIGHT CHAINS
Kappa free light chain: 44.1 mg/L — ABNORMAL HIGH (ref 3.3–19.4)
Kappa, lambda light chain ratio: 1.42 (ref 0.26–1.65)
Lambda free light chains: 31.1 mg/L — ABNORMAL HIGH (ref 5.7–26.3)

## 2024-01-11 ENCOUNTER — Inpatient Hospital Stay: Admitting: Oncology

## 2024-01-16 ENCOUNTER — Other Ambulatory Visit: Payer: Self-pay | Admitting: *Deleted

## 2024-01-16 MED ORDER — AMLODIPINE BESYLATE 10 MG PO TABS: 10.0000 mg | ORAL_TABLET | Freq: Every day | ORAL | 0 refills | Status: AC

## 2024-01-21 ENCOUNTER — Other Ambulatory Visit: Payer: Self-pay | Admitting: Family Medicine

## 2024-01-25 ENCOUNTER — Telehealth: Payer: Self-pay | Admitting: *Deleted

## 2024-04-11 ENCOUNTER — Encounter (INDEPENDENT_AMBULATORY_CARE_PROVIDER_SITE_OTHER): Admitting: Ophthalmology

## 2024-06-18 ENCOUNTER — Inpatient Hospital Stay: Payer: Medicare (Managed Care)

## 2024-06-18 ENCOUNTER — Other Ambulatory Visit

## 2024-06-25 ENCOUNTER — Inpatient Hospital Stay: Admitting: Oncology

## 2024-09-24 ENCOUNTER — Ambulatory Visit
